# Patient Record
Sex: Male | Born: 1935 | Race: White | Hispanic: No | Marital: Married | Smoking: Former smoker
Health system: Southern US, Community
[De-identification: ages and names within clinical notes are randomized; demographics above are authoritative.]

## PROBLEM LIST (undated history)

## (undated) DIAGNOSIS — I10 Essential (primary) hypertension: Secondary | ICD-10-CM

## (undated) DIAGNOSIS — I739 Peripheral vascular disease, unspecified: Secondary | ICD-10-CM

## (undated) DIAGNOSIS — K219 Gastro-esophageal reflux disease without esophagitis: Secondary | ICD-10-CM

## (undated) DIAGNOSIS — E7800 Pure hypercholesterolemia, unspecified: Secondary | ICD-10-CM

## (undated) DIAGNOSIS — I2510 Atherosclerotic heart disease of native coronary artery without angina pectoris: Secondary | ICD-10-CM

## (undated) DIAGNOSIS — M1990 Unspecified osteoarthritis, unspecified site: Secondary | ICD-10-CM

## (undated) DIAGNOSIS — J449 Chronic obstructive pulmonary disease, unspecified: Secondary | ICD-10-CM

## (undated) DIAGNOSIS — J40 Bronchitis, not specified as acute or chronic: Secondary | ICD-10-CM

## (undated) DIAGNOSIS — J189 Pneumonia, unspecified organism: Secondary | ICD-10-CM

## (undated) DIAGNOSIS — J90 Pleural effusion, not elsewhere classified: Secondary | ICD-10-CM

## (undated) DIAGNOSIS — J9600 Acute respiratory failure, unspecified whether with hypoxia or hypercapnia: Secondary | ICD-10-CM

## (undated) DIAGNOSIS — R739 Hyperglycemia, unspecified: Secondary | ICD-10-CM

## (undated) DIAGNOSIS — F419 Anxiety disorder, unspecified: Secondary | ICD-10-CM

## (undated) DIAGNOSIS — K635 Polyp of colon: Secondary | ICD-10-CM

## (undated) DIAGNOSIS — G4730 Sleep apnea, unspecified: Secondary | ICD-10-CM

## (undated) DIAGNOSIS — E039 Hypothyroidism, unspecified: Secondary | ICD-10-CM

## (undated) DIAGNOSIS — E119 Type 2 diabetes mellitus without complications: Secondary | ICD-10-CM

## (undated) HISTORY — PX: COLONOSCOPY: SHX174

## (undated) HISTORY — PX: CATARACT EXTRACTION W/ INTRAOCULAR LENS  IMPLANT, BILATERAL: SHX1307

## (undated) HISTORY — DX: Hyperglycemia, unspecified: R73.9

## (undated) HISTORY — PX: HEMORRHOID SURGERY: SHX153

---

## 2000-01-31 HISTORY — PX: CORONARY ANGIOPLASTY: SHX604

## 2001-09-02 ENCOUNTER — Ambulatory Visit (HOSPITAL_COMMUNITY): Admission: RE | Admit: 2001-09-02 | Discharge: 2001-09-02 | Payer: Self-pay | Admitting: *Deleted

## 2006-06-20 ENCOUNTER — Inpatient Hospital Stay (HOSPITAL_COMMUNITY): Admission: EM | Admit: 2006-06-20 | Discharge: 2006-06-21 | Payer: Self-pay | Admitting: Emergency Medicine

## 2006-07-07 ENCOUNTER — Ambulatory Visit: Payer: Self-pay | Admitting: Pulmonary Disease

## 2006-07-07 ENCOUNTER — Encounter: Admission: RE | Admit: 2006-07-07 | Discharge: 2006-07-07 | Payer: Self-pay | Admitting: Interventional Cardiology

## 2006-07-07 ENCOUNTER — Inpatient Hospital Stay (HOSPITAL_COMMUNITY): Admission: AD | Admit: 2006-07-07 | Discharge: 2006-07-09 | Payer: Self-pay | Admitting: Interventional Cardiology

## 2006-07-08 ENCOUNTER — Encounter (INDEPENDENT_AMBULATORY_CARE_PROVIDER_SITE_OTHER): Payer: Self-pay | Admitting: Interventional Cardiology

## 2006-07-08 ENCOUNTER — Encounter (INDEPENDENT_AMBULATORY_CARE_PROVIDER_SITE_OTHER): Payer: Self-pay | Admitting: *Deleted

## 2006-07-15 ENCOUNTER — Ambulatory Visit: Payer: Self-pay | Admitting: Pulmonary Disease

## 2006-07-22 ENCOUNTER — Ambulatory Visit: Payer: Self-pay | Admitting: Pulmonary Disease

## 2006-08-06 ENCOUNTER — Ambulatory Visit: Payer: Self-pay | Admitting: Pulmonary Disease

## 2006-09-07 ENCOUNTER — Ambulatory Visit: Payer: Self-pay | Admitting: Pulmonary Disease

## 2006-09-08 ENCOUNTER — Ambulatory Visit (HOSPITAL_COMMUNITY): Admission: RE | Admit: 2006-09-08 | Discharge: 2006-09-08 | Payer: Self-pay | Admitting: Pulmonary Disease

## 2006-09-11 ENCOUNTER — Ambulatory Visit: Admission: RE | Admit: 2006-09-11 | Discharge: 2006-09-11 | Payer: Self-pay | Admitting: Pulmonary Disease

## 2006-09-23 ENCOUNTER — Ambulatory Visit: Payer: Self-pay | Admitting: Pulmonary Disease

## 2006-10-15 ENCOUNTER — Ambulatory Visit: Payer: Self-pay | Admitting: Pulmonary Disease

## 2006-11-26 ENCOUNTER — Ambulatory Visit: Payer: Self-pay | Admitting: Pulmonary Disease

## 2007-04-25 ENCOUNTER — Ambulatory Visit: Payer: Self-pay | Admitting: Pulmonary Disease

## 2007-09-20 DIAGNOSIS — G4730 Sleep apnea, unspecified: Secondary | ICD-10-CM

## 2007-09-21 ENCOUNTER — Ambulatory Visit: Payer: Self-pay | Admitting: Pulmonary Disease

## 2007-09-21 DIAGNOSIS — R269 Unspecified abnormalities of gait and mobility: Secondary | ICD-10-CM | POA: Insufficient documentation

## 2007-09-21 DIAGNOSIS — G4700 Insomnia, unspecified: Secondary | ICD-10-CM

## 2007-09-21 DIAGNOSIS — F5104 Psychophysiologic insomnia: Secondary | ICD-10-CM | POA: Insufficient documentation

## 2007-09-21 DIAGNOSIS — J449 Chronic obstructive pulmonary disease, unspecified: Secondary | ICD-10-CM

## 2007-09-30 ENCOUNTER — Telehealth (INDEPENDENT_AMBULATORY_CARE_PROVIDER_SITE_OTHER): Payer: Self-pay | Admitting: *Deleted

## 2007-11-24 ENCOUNTER — Telehealth: Payer: Self-pay | Admitting: Pulmonary Disease

## 2008-01-16 ENCOUNTER — Ambulatory Visit: Payer: Self-pay | Admitting: Pulmonary Disease

## 2008-03-16 ENCOUNTER — Ambulatory Visit: Payer: Self-pay | Admitting: Pulmonary Disease

## 2008-03-19 ENCOUNTER — Telehealth (INDEPENDENT_AMBULATORY_CARE_PROVIDER_SITE_OTHER): Payer: Self-pay | Admitting: *Deleted

## 2008-03-26 ENCOUNTER — Telehealth (INDEPENDENT_AMBULATORY_CARE_PROVIDER_SITE_OTHER): Payer: Self-pay | Admitting: *Deleted

## 2008-06-08 ENCOUNTER — Emergency Department (HOSPITAL_COMMUNITY): Admission: EM | Admit: 2008-06-08 | Discharge: 2008-06-08 | Payer: Self-pay | Admitting: Emergency Medicine

## 2010-10-14 NOTE — Assessment & Plan Note (Signed)
[facility]                             PULMONARY OFFICE NOTE   [name], [name]                         MRN:          [id number]  DATE:11/26/2006                            DOB:          Aug 24, 1926    I saw [name] in followup today for his obstructive sleep apnea.   He is doing quite well.  He is currently on an auto CPAP machine with a  full face mask and heated humidification.  He is using the machine for  the entire time that he is asleep.  He goes to bed at 10 o'clock at  night.  He will wake up once in the night to use the bathroom and then  goes back to sleep.  He gets up at 7:30 in the morning.  He said he had  one episode where he unwittingly took his mask off in the middle of the  night, but otherwise he says that he has noticed an increased amount of  energy during the day and he is planning [facility]  He is not having any problems as far as  dryness or congestion in his nose.  He does have occasional dryness in  his mouth, but this is not too much of a problem.   His medication list was reviewed.   PHYSICAL EXAMINATION:  He is 257 pounds.  Temperature is 97.5.  Blood  pressure is 108/68, heart rate is 55, oxygen saturation is 95% on room  air.  HEENT:  There is no sinus tenderness, no nasal discharge, no oral  lesions, no lymphadenopathy.  HEART:  With S1, S2.  CHEST:  Clear to auscultation.  ABDOMEN:  Obese, soft, nontender.  EXTREMITIES:  With no edema.   IMPRESSION:  He has severe obstructive sleep apnea currently doing quite  well on an auto CPAP machine.  I would encourage him to maintain his  compliance with the CPAP machine.  I have also emphasized to him the  importance of diet, exercise and weight reduction.   I will follow up with him in approximately 6 months.     Bryan Helling, MD  Electronically Signed    VS/MedQ  DD: 11/26/2006  DT: 11/26/2006  Job #: [id number]   cc:   Bryan Hatfield, M.D.  Bryan Dubin PA Bryan Hatfield

## 2010-10-14 NOTE — Assessment & Plan Note (Signed)
[facility]                             PULMONARY OFFICE NOTE   STANLEE, ROEHRIG                         MRN:          [id number]  DATE:04/25/2007                            DOB:          1927/05/22    I saw Mr. Goldinger in followup today for his severe obstructive sleep apnea.  He is currently on an auto CPAP with a full face mask and heated  humidification.  His main difficulty today is that he is having trouble  falling asleep.  He says that he takes temazepam 30 mg at around 6:30 at  night.  He will not then go to sleep until about 11 o'clock at night.  He says it takes him up to an hour and a half to fall asleep.  He will  oftentimes have a lot of thoughts on his mind, and he has to check the  clock while he is trying to fall asleep.  He says that if he cannot fall  asleep after a while, he gets up and watches TV.  Once he does fall  asleep, he is able to sleep for about 3 hours.  He wakes up to use the  bathroom, but then is able to fall back to sleep after that.  He gets up  at about 7:30 in the morning and says that he feels reasonably  refreshed, and does not feel the need to take naps during the day.  He  is not having problems as far as tolerating his CPAP mask, and feels  like he is getting adequate pressure from this.  He has not had any  other significant changes in health or his medications since his last  visit.   IMPRESSION:  1. Severe obstructive sleep apnea.  I would continue him on his      current settings of auto CPAP, and I have encouraged him to      maintain his compliance with this.  2. Sleep onset insomnia.  I had reviewed various techniques with      regards to cognitive behavioral therapy, specifically with regards      to stimulus control and relaxation techniques.  He does also have a      history of depression, and it may be beneficial to have this      further addressed.  In addition, I have discussed with him the  possibility of tapering off of his Restoril since I am not sure if      this is actually providing him with any benefit, but I would defer      this decision to his managing team at the [facility].   I will follow up with him in approximately 6 months.     Coralyn Helling, MD  Electronically Signed    VS/MedQ  DD: 04/25/2007  DT: 04/25/2007  Job #: [id number]   cc:   Lyn Records, M.D.  Corwin Levins, MD

## 2010-10-14 NOTE — Assessment & Plan Note (Signed)
[facility]                             PULMONARY OFFICE NOTE   Bryan Hatfield, Bryan Hatfield                         MRN:          [id number]  DATE:10/15/2006                            DOB:          04/05/1927    This is a very pleasant 77 year old gentleman who follows here for  hypersomnolence.  He had been evaluated initially for a pleural  effusion, which had resolved.  This was parapneumonic.  During the  course of evaluation for his pleural effusion, it was noted that the  patient had issues with daytime somnolence and snoring.  We did evaluate  him for sleep apnea, and he indeed does have moderate obstructive sleep  apnea with an apnea/hypopnea index of 29 events per hour.  Oxygen  desaturations were as low as 29%.  The patient has now been placed on  auto-set CPAP with oxymetry to be repeated with a CPAP.  The patient,  however, since starting CPAP therapy, feels that he is getting a more  restful sleep, and actually his wife also states that he is more awake  during the day and also notes that he does not have snoring.   CURRENT MEDICATIONS:  As noted on the intake sheet.  These have been  reviewed and are accurate.   PHYSICAL EXAM:  VITAL SIGNS:  Noted.  Oxygen saturation was 96% on room  air.  GENERAL:  This is a well-developed obese gentleman who is in no acute  distress.  HEENT:  Examination is unremarkable.  NECK:  Supple.  No adenopathy noted.  No JVD.  LUNGS:  Clear to auscultation bilaterally.  CARDIAC:  Regular rate and rhythm.  No rubs, murmurs, or gallops heard.  EXTREMITIES:  The patient has no cyanosis, clubbing, or edema noted.   IMPRESSION:  Obstructive sleep apnea.  The patient is currently on auto-  set device.  He is to continue using continuous positive airway  pressure.   PLAN:  Will be for the patient to follow up either with Dr. Coralyn Helling  or Dr. Marcelyn Bruins.  The patient has been made aware that I will be  leaving the  practice.     Gailen Shelter, MD  Electronically Signed    CLG/MedQ  DD: 11/06/2006  DT: 11/06/2006  Job #: [id number]   cc:   Sinda Du, PA

## 2010-10-17 NOTE — Assessment & Plan Note (Signed)
[facility]                             PULMONARY OFFICE NOTE   Bryan Hatfield                         MRN:          [id number]  DATE:07/15/2006                            DOB:          [birth year]    [name] is a 77 year old white male with a past medical history  significant for anxiety, insomnia, diabetes mellitus with diet control,  GERD, hyperlipidemia, who was admitted on January 20th through January  21st due to shortness of breath.  At that point, we thought that this  was coming from CHF, and he underwent cardiac catheterization that did  not show major problems.  He was seen by Dr. Verdis Prime at that point.  Also, during that hospitalization, left pleural effusion was seen with a  really small right pleural effusion.  He was sent home, but because the  patient continued to have shortness of breath, he was readmitted in  February.  I do not have the discharge summary of that admission, but he  was seen by Dr. Jayme Cloud, and he had a thoracentesis.  He comes today  for a hospital follow-up, saying he is still with shortness of breath.  He is pretty much sleeping in his recliner.  He gets short of breath at  night.  He has been very weak, and he gets short of breath with minimal  effort.  He denies any fever, chills, sputum, or cough.  He does say  that he has some chest pain with deep inspiration.  Looking back on his  perfusion labs, he was AFB negative.  Culture was negative.  He had  reactive microthelia cells plus some inflammation, and pathology did not  show any malignant cells.  His ESR was 95, LDH 52 of the fluid, and  protein of the fluid was 3.5 with total protein 016.2.  Today, we did a  chest x-ray that showed persistent left pleural effusion.   MEDICATIONS:  1. Lasix 30 mg daily.  2. Zocor 80 mg daily.  3. Lisinopril 10 mg daily.  4. Zoloft 100 mg daily.  5. Aspirin 81 mg daily.  6. Clonazepam 1 mg b.i.d.  7.  Oxybutynin 5 mg b.i.d.  8. Omeprazole 20 mg p.o. daily.  9. Temazepam 30 mg p.o. nightly.  10.MVI daily.  11.Nitroglycerin 0.4 mg sublingual p.r.n.  12.Advil p.r.n.   PAST MEDICAL HISTORY:  Reviewed.   PHYSICAL EXAMINATION:  VITAL SIGNS:  Temperature 97.6, weight 247, blood  pressure lying down is 100/60 with pulse 92.  Sitting 102/60 with pulse  88.  Standing 98/58 with pulse 88.  Oxygen saturation 92% on room air.  GENERAL:  He is a very pleasant man in no acute distress.  Very  cooperative.  HEENT:  Pharynx is clear with no exudate.  HEART:  Regular rate and rhythm.  No murmurs.  LUNGS:  He has good air movement on the left side.  He has decreased  breath sounds on the lower third and also dullness to percussion.  He  has bronchial sounds just above this area.  EXTREMITIES:  Lower extremities, trace edema.   IMPRESSION:  Left pleural effusion:  At this point, the results of the  pleural effusion were in between exudate and transudate.  So, the  working diagnosis is probably secondary to pneumonia infection.   PLAN:  We will start Factive 1 p.o. daily for 7 days, Celebrex 200 mg  p.o. daily for 7 days.  Follow up in one week when he will get a chest x-  ray if pleural effusion does not show any improvement.  We will refer  him to [facility] for VATS.  Finally, because his blood pressure is on the  lower side, even though he is not orthostatic, we advised the patient to  hold on his Lasix for 2-3 days, as he has been getting dizzy when  standing up at home.      Bryan Bast, MD      C. Danice Goltz, MD  Electronically Signed   Rivka Safer  DD: 07/15/2006  DT: 07/15/2006  Job #: [id number]   cc:   Lyn Records, M.D.

## 2010-10-17 NOTE — Assessment & Plan Note (Signed)
Bryan Hatfield                             PULMONARY OFFICE NOTE   Bryan Hatfield, Bryan Hatfield                         MRN:          [id number]  DATE:07/22/2006                            DOB:          [birth year]    HISTORY OF PRESENT ILLNESS:  Patient is a very pleasant 77 year old  white male patient of Dr. Jayme Cloud who presents for a one week followup  of a pleural effusion.  The patient was recently hospitalized January 20  through January 21 for dyspnea.  The patient was found to have a large  left pleural effusion and a very small right pleural effusion.  The  patient underwent a thoracentesis which cytology showed a mixed  exudative/transudative pleural effusion negative for malignant cells.  The patient was seen one week ago in the office showing a persistent  left pleural effusion felt secondary to possibly a pneumonia.  He was  started on a seven day course of Factive and given Celebrex for seven  days.  Patient returns today for follow-up chest x-ray.   His last visit, he feels much improved with decreased shortness of  breath and cough.  The patient denies any hemoptysis, purulent sputum,  fever, chest pain, or leg swelling.   PAST MEDICAL HISTORY:  Reviewed.   CURRENT MEDICATIONS:  Reviewed.   PHYSICAL EXAMINATION:  GENERAL:  Patient is an elderly male in no acute  distress.  VITAL SIGNS:  He is afebrile with stable vital signs.  O2 saturation is  94% on room air.  HEENT:  Unremarkable.  NECK:  Supple without adenopathy.  LUNGS:  Lung sounds revealed slightly diminished breath sounds in the  bases.  CARDIAC:  Regular rate.  ABDOMEN:  Soft and benign.  EXTREMITIES:  Warm with trace edema.   DATA:  A chest x-ray shows a left pleural effusion.   IMPRESSION/PLAN:  Persistent left lower lobe pleural effusion, slightly  improved on x-ray.  The patient does appear clinically to be improved.  We will hold off on any further antibiotics.  He will  continue on Celebrex 200 mg daily and return back with Dr.  Jayme Cloud in two weeks for a follow-up chest x-ray.      Rubye Oaks, NP  Electronically Signed      Gailen Shelter, MD  Electronically Signed   TP/MedQ  DD: 07/23/2006  DT: 07/23/2006  Job #: [id number]

## 2010-10-17 NOTE — Discharge Summary (Signed)
NAME:  Bryan, Hatfield NO.:  [id number]   MEDICAL RECORD NO.:  [id number]          PATIENT TYPE:  INP   LOCATION:  3711                         FACILITY:  [facility]   PHYSICIAN:  Lyn Records, M.D.   DATE OF BIRTH:  1926-11-18   DATE OF ADMISSION:  07/07/2006  DATE OF DISCHARGE:  07/09/2006                               DISCHARGE SUMMARY   ADMISSION DIAGNOSIS:  Worsening dyspnea.   DISCHARGE DIAGNOSES:  1. Worsening dyspnea secondary to bilateral pleural effusion with the      left being greater than the right status post thoracentesis,      resolved.  2. Elevated D-dimer status post chest CT angiography that is negative      for pulmonary embolism.  3. Status post cardiac catheterization, January 2008, with      nonobstructive coronary disease and a normal left ventricular      function.  4. Dyslipidemia.  5. Anxiety.  6. Insomnia.  7. Diabetes mellitus, diet-controlled.  8. Gastroesophageal reflux disease.  9. Status post hemorrhoidectomy.  10.Left leg fracture in the 1980s.  11.Anemia, stable.  12.Leukocytosis secondary to questionable bronchogenic process.   CONSULTATIONS:  Pulmonology.   PROCEDURES:  Left thoracentesis on July 08, 2006.   HOSPITAL COURSE:  Bryan Hatfield is a 77 year old male who recently underwent  a cardiac catheterization in January 2008 with nonobstructive coronary  and a normal EF.  He was admitted to the [facility] on July 07, 2006 with progressive dyspnea.  He admitted to a productive cough  with yellow sputum.  He also complained of dizziness and fatigue.  Initially chest x-ray as an outpatient on the day of admission revealed  left lower lobe lung consolidation or effusion.  BNP was mildly elevated  at 214, however, was nondiagnostic.  D-dimer was elevated at 3.51;  however, chest CT angiography was negative for pulmonary embolism.  Serial cardiac enzymes were negative x3 with a peak troponin of 0.05.  TSH was  normal.  A 12-lead EKG revealed normal sinus rhythm with a  ventricular rate of 77 beats per minute with nonspecific T-wave  abnormalities.  There was no evidence of acute ischemic changes.  A 2-D  echocardiogram revealed normal LV systolic function with an EF of 55%.  There was no evidence of LV regional wall motion abnormalities.  The  right ventricle was dilated, and RV systolic function was mildly  reduced.  Left lateral decubitus view of the chest revealed left pleural  effusion that appeared partially free-flowing.  The patient underwent a  left thoracentesis with improvement of dyspnea.  Pleural effusion  appeared to be mainly exudative.  During this admission, the patient had  elevated liver function and a decreased albumin, so a hepatitis panel  was checked.  Repeat chest x-ray on the following day revealed decreased  size of left pleural effusion after thoracentesis.  No pneumothorax.  Improved left base atelectasis and cardiomegaly without failure.  During  the admission, the patient experienced episodic hypotension, so his  lisinopril was placed on hold for a heart rate less  than 60 or systolic  blood pressure less than 90.  A urine stress antigen was checked.  However, UA was negative.  The patient was discharged to home in stable  condition on July 09, 2006, with the diagnosis from the thoracentesis  as pseudoexudate.  He had been on Lasix prior to this hospitalization.  Cytology of the thoracentesis was pending.  The patient was scheduled  for an outpatient sleep study as well as a followup chest x-ray at  [facility] on February 14.  He was seen and examined by Dr.  Verdis Prime prior to discharge, who was in agreement with the discharge  decision.   LABORATORY DATA:  UA negative.  Urine strep pneumo antigen negative, TSH  1.953.  BNP 214 and 192, respectively.  Fasting lipid panel:  Total  cholesterol 116, triglycerides 85, HDL 32, LDL 67.  Serial cardiac   enzymes:  CK total 51, 54, and 41, respectively.  CK-MB 1.2, 0.9, and  0.7, respectively.  Troponin I 0.05 x2 and 0.04.  PT 14.6, INR 1.1, PTT  40, D-dimer 3.51.  Sodium 140, potassium 3.8, chloride 105, CO2 30,  glucose 104, BUN 11, creatinine 0.85.  ESR 95.  White blood count 5.8,  hemoglobin 10.9, hematocrit 32.2, platelets 290,000.   X-RAYS:  As stated in the hospital course.   EKG:  A 12-lead EKG July 09, 2006 revealed normal sinus rhythm with a  ventricular rate of 69 beats per minute.  There was no evidence of acute  ischemic changes.   CONDITION ON DISCHARGE:  Stable.   DISCHARGE MEDICATIONS:  1. Lasix 30 mg daily.  2. Zocor 80 mg daily.  3. Lisinopril 10 mg daily.  4. Zoloft 100 mg daily.  5. Aspirin 81 mg daily.  6. Sublingual nitroglycerin 0.4 mg p.r.n. chest pain.  7. Clonazepam 1 mg twice daily.  8. Oxybutynin 5 mg twice daily.  9. Omeprazole 20 mg daily.  10.Temazepam 30 mg q.h.s.  11.Multivitamin 1 tablet daily.  12.Advil p.r.n.   DISCHARGE INSTRUCTIONS:  1. No restrictions on activity.  2. Continue low-sodium, low-fat, and low-cholesterol diet.  3. Stop any activity that causes chest pain, shortness of breath,      dizziness, diaphoresis, or excessive weakness.   FOLLOWUP APPOINTMENT:  1. Followup appointment with Dr. Verdis Prime at [facility] as      previously scheduled or as needed.  The      patient is scheduled to call [phone] for an appointment.  2. Followup appointment at [facility] with Dr. Danice Goltz      on July 15, 2006 at 2:30 p.m. with a chest x-ray.      Tylene Fantasia, [place]      Lyn Records, M.D.  Electronically Signed    RDM/MEDQ  D:  08/23/2006  T:  08/23/2006  Job:  [id number]   cc:   Marjory Lies, M.D.  Gailen Shelter, MD

## 2010-10-17 NOTE — Procedures (Signed)
NAME:  Bryan Hatfield, Bryan Hatfield NO.:  [id number]   MEDICAL RECORD NO.:  [id number]          PATIENT TYPE:  OUT   LOCATION:  SLEEP LAB                     FACILITY:  [facility]   PHYSICIAN:  Barbaraann Share, MD,FCCPDATE OF BIRTH:  1928/[birth year]/10   DATE OF STUDY:  09/11/2006                            NOCTURNAL POLYSOMNOGRAM   REFERRING PHYSICIAN:   REFERRING PHYSICIAN:  Dr. Danice Goltz   INDICATION FOR STUDY:  Hypersomnia with sleep apnea.   EPWORTH SCORE:  Two.   SLEEP ARCHITECTURE:  The patient had total sleep time of 319 minutes  with adequate slow wave sleep for age, however very decreased REM at  only 13 minutes.  Sleep onset latency was normal and REM onset was very  prolonged at 293 minutes.  Sleep efficiency was decreased at 81%.   RESPIRATORY DATA:  The patient was found to have 114 obstructive  hypopneas and 42 obstructive apneas for an apnea/hypopnea index of 29  events per hour.  The events occurred primarily in the supine position  and there was moderate to loud snoring noted throughout.   OXYGEN DATA:  The patient had O2 desaturation as low as 79% with his  obstructive events during REM.   CARDIAC DATA:  No clinically significant cardiac arrhythmias were noted.   MOVEMENT/PARASOMNIA:  The patient was found to have 77 leg jerks with  three per hour resulting in arousal or awakening.   IMPRESSION/RECOMMENDATIONS:  1. Moderate obstructive sleep apnea/hypopnea syndrome with an      apnea/hypopnea index of 29 events per hour and O2 desaturation as      low as 79%.  Treatment for this degree of sleep apnea can include      weight loss alone if applicable, upper airway surgery, oral      appliance, and also CPAP.  2. Moderate numbers of leg jerks with significant sleep disruption.      This may simply be a manifestation of the patient's sleep      disordered breathing, however a primary movement disorder of sleep      should be kept in mind if      the patient  is very symptomatic now or if he fails to respond      adequately to treatment of his obstructive sleep apnea.      Barbaraann Share, MD,FCCP  Diplomate, American Board of Sleep  Medicine  Electronically Signed     [facility]/MEDQ  D:  09/22/2006 14:00:06  T:  09/22/2006 14:29:54  Job:  [id number]

## 2010-10-17 NOTE — Assessment & Plan Note (Signed)
[facility]                             PULMONARY OFFICE NOTE   NAME:Bryan Hatfield, Bryan Hatfield                         MRN:          [id number]  DATE:08/06/2006                            DOB:          10-07-1926    HISTORY OF PRESENT ILLNESS:  The patient is a 77 year old white male  patient of Dr. Berlinda Last who has a known history of diabetes  mellitus, gastroesophageal reflux, hyperlipidemia, who is being followed  for a persistent pleural effusion.  The patient returns today for a 2-  week followup.  The patient has had a persistent large left pleural  effusion and a very small right pleural effusion.  The patient underwent  a thoracentesis with cytology which showed a mixed  exudative/transudative pleural effusion which was negative for malignant  cells.  He was treated with antibiotics and nonsteroidals, Celebrex.  The patient returns today reporting that he is much improved with  decreased shortness of breath and chest wall pain has totally resolved.  The patient denies any purulent sputum, fever, chest pain, orthopnea,  PND or leg swelling.   PAST MEDICAL HISTORY:  Reviewed.   CURRENT MEDICATIONS:  Reviewed.   PHYSICAL EXAMINATION:  GENERAL:  The patient is a pleasant male in no  acute distress.  VITAL SIGNS:  He is afebrile with stable vital signs.  O2 saturation is  96% on room air.  HEENT:  Unremarkable.  NECK:  Supple without adenopathy.  No JVD.  LUNG SOUNDS:  Reveal slightly diminished breath sounds on the left.  CARDIAC:  Regular rate and rhythm.  ABDOMEN:  Soft and nontender.  EXTREMITIES:  Warm without any calf tenderness, cyanosis, clubbing, or  edema.   DATA:  Chest x-ray shows an essentially decreased pleural effusion on  the left.   IMPRESSION AND PLAN:  Left lower pleural effusion, much improved today.  The patient will continue on his present regimen.  May discontinue  Celebrex.  Will follow back up in 1 month with Dr.  Jayme Cloud for a  followup chest x-ray.      Rubye Oaks, NP  Electronically Signed      Gailen Shelter, MD  Electronically Signed   TP/MedQ  DD: 08/06/2006  DT: 08/07/2006  Job #: [id number]

## 2010-10-17 NOTE — Cardiovascular Report (Signed)
NAME:  Bryan Hatfield, Bryan Hatfield NO.:  [id number]   MEDICAL RECORD NO.:  [id number]          PATIENT TYPE:  INP   LOCATION:  3740                         FACILITY:  [facility]   PHYSICIAN:  Corky Crafts, MDDATE OF BIRTH:  Mar 05, 1927   DATE OF PROCEDURE:  06/21/2006  DATE OF DISCHARGE:                            CARDIAC CATHETERIZATION   REFERRING PHYSICIAN:  Lyn Records, M.D.   PROCEDURES PERFORMED:  Left heart catheterization, left ventriculogram,  coronary angiogram, abdominal aortogram.   OPERATOR:  Corky Crafts, M.D.   INDICATIONS:  Chest pain, dyspnea on exertion.   PROCEDURE NARRATIVE:  The risks and benefits of cardiac catheterization  were explained to the patient and informed consent was obtained. The  patient was brought to the cath lab and placed on the table.  He was  prepped and draped the usual sterile fashion.  Right groin was  infiltrated 1% lidocaine.  A 6-French arterial sheath was placed into  the right femoral artery using modified Seldinger technique.  The left  coronary artery angiography was performed using a JL-4.0 catheter.  The  catheter was advanced to the vessel ostium under fluoroscopic guidance.  Digital angiography was performed in multiple projections using hand  injection of contrast. The right coronary artery angiography was then  performed using a JR-4.0 catheter.  The catheter was advanced to the  vessel ostium under fluoroscopic guidance.  Digital angiography was  performed in multiple projections using hand injection of contrast.  Pigtail catheter was then advanced to the ascending aorta and across the  aortic valve.  A power injection of contrast done in the 30 degree RAO  position.  The image left ventricle under continuous hemodynamic  pressure monitoring.  A pullback was performed.  The catheter was then  withdrawn to the level renal arteries.  Power injection of contrast was  done in the AP projection.  The sheath  was removed and a Star close was  deployed for hemostasis.   FINDINGS:  The left main was calcified.  There is a 25% ostial left main  lesion which did not cause catheter damping. The left circumflex was a  medium-sized vessel with luminal irregularities.  There is a medium-  sized OM-1 also with luminal irregularities.  The left anterior  descending was a large vessel in proximal and midportion with luminal  irregularities.  The vessel tapered before reaching the apex. The D1 was  a large vessel.  There is an ostial 50% lesion. D2 was small vessel.  The right coronary artery was a large dominant vessel with luminal  irregularities.  This vessel supplied blood flow to the apex.  The left  ventriculogram revealed normal ventricular function with an estimated EF  of 60%.   HEMODYNAMIC RESULTS:  Left ventricular pressure of 91/6 with an LVEDP of  25, aortic pressure 91/53 with a mean aortic pressure of 69 mmHg. The  abdominal aortogram revealed no evidence of renal artery stenosis.  There are mild irregularities of the abdominal aorta.   IMPRESSION:  1. No significant coronary artery disease.  2. Mildly increased  left ventricular end-diastolic pressure.  3. Normal left ventricular function.  4. No renal artery stenosis.   RECOMMENDATIONS:  Continue medical therapy. We will increase the  patient's Lasix from 10-20 mg daily.  His symptoms of dyspnea on  exertion may be somewhat related to his elevated LVEDP.  The patient  will follow-up with Dr. Katrinka Blazing.      Corky Crafts, MD  Electronically Signed     JSV/MEDQ  D:  06/21/2006  T:  06/21/2006  Job:  [id number]

## 2010-10-17 NOTE — Discharge Summary (Signed)
NAME:  Bryan Hatfield, ABDALLAH NO.:  [id number]   MEDICAL RECORD NO.:  [id number]          PATIENT TYPE:  INP   LOCATION:  3740                         FACILITY:  [facility]   PHYSICIAN:  Lyn Records, M.D.   DATE OF BIRTH:  Oct 10, 1926   DATE OF ADMISSION:  06/20/2006  DATE OF DISCHARGE:  06/21/2006                               DISCHARGE SUMMARY   ADMISSION DIAGNOSIS:  Chest pain.   DISCHARGE DIAGNOSES:  1. Chest pain resolved.  Status post cardiac catheterization with no      significant coronary artery disease June 21, 2006.  2. Normal LV function via cardiac catheterization with an EF of 60%.  3. Gastroesophageal reflux disease.  4. Hyperlipidemia.  5. Insomnia.  6. Anxiety.  7. Diabetes mellitus with diet control.  8. Status post hemorrhoidectomy.  9. Status post left leg fracture in the 1980s.  10.History of pruritus.   CONSULTATIONS:  None.   PROCEDURES:  June 21, 2006:  Left heart catheterization, left  ventriculogram, coronary angiogram, abdominal aortogram.   HOSPITAL COURSE:  Mr. Hobbins is a 77 year old male who underwent a PTCA by  Dr. Katrinka Blazing in 2001.  Since that time he has complained of no subsequent  angina until this hospital admission which occurred on June 20, 2006.  A 12-lead EKG revealed no acute ischemic changes.  Chest x-ray revealed  cardiomegaly with possible vascular congestion.  Serial cardiac enzymes  were negative x3 with a peak troponin of 0.01.  The patient was started  on IV nitroglycerin and subcu Lovenox.  He was also started on a beta  blocker.  The following day he underwent a cardiac catheterization that  was performed by Dr. Eldridge Dace revealing no significant coronary artery  disease.  Normal LV function with an EF of 60% was noted.  The plan was  to continue medical therapy and to increase the patient's Lasix from 10  to 20 mg daily.  His symptoms of dyspnea on exertion were believed to be  somewhat related to his elevated  LVEDP.  The patient tolerated the  procedure well with a minimal estimated blood loss.  The patient was  observed and successfully completed IV hydration as well as bed rest.  His groin was stable without evidence of bleeding, oozing or hematoma.  He was discharged to home on the same day after completing bed rest  without any further complaints of angina, shortness of breath,  diaphoresis, dizziness, nausea, vomiting.   LABORATORY DATA:  Serial cardiac enzymes:  CK total 72, 71, and 70, CK-  MB 0.8, 1.2, and 1.  Troponin I 0.01 x3, myoglobin 112, CK-MB less than  1, troponin I less than 0.05.  BNP 66.  Fasting lipid panel:  Total  cholesterol 154, triglycerides 82, HDL 50, LDL 88.  Sodium 136,  potassium 4.5, chloride 104, CO2 25, glucose 159, BUN 21, creatinine  0.91.  D-dimer less than 0.22.  PT 15.3, INR 1.2, PTT 52, PSA 0.85.  TSH  6.849, free T4 6.9.   X-rays as stated in the Hospital Course.   12-lead EKGs  as stated in the Hospital Course.   CONDITION ON DISCHARGE:  Stable.   DISCHARGE MEDICATIONS:  1. Furosemide 20 mg daily.  This represented an increase in dosage.  A      prescription was given with refills.  2. Sertraline 100 mg daily.  3. Lisinopril 10 mg daily.  4. Clonazepam 1 mg 1/2 tablet twice daily as needed.  5. Simvastatin 80 mg daily.  6. Hydroxyzine 20 mg daily as needed.  7. Oxybutynin chloride 5 mg twice daily.  8. Omeprazole 20 mg daily.  9. Temazepam 30 mg at bedtime.  10.Aspirin 81 mg daily.  11.Multivitamin one tablet daily.   DISCHARGE INSTRUCTIONS:  1. No lifting greater than 10 pounds for one week.  2. No driving for two days.  3. Continue a low sodium, low fat, and low cholesterol diet.  4. Cleanse groin area with warm soap and water.  Do not scrub area.      Avoid straining.  5. Stop any activity that causes chest pain, shortness of breath,      dizziness, sweating or excessive weakness.   FOLLOW-UP APPOINTMENTS:  1. A follow-up  appointment has been scheduled with Kizzie Furnish, PA-C      for groin check on June 28, 2006 at 1:30 p.m.  The patient is      scheduled to call [phone] if he is unable to make the scheduled      appointment.  2. Follow up with Dr. Verdis Prime as previously scheduled or as      needed.      Tylene Fantasia, [place]      Lyn Records, M.D.  Electronically Signed    RDM/MEDQ  D:  11/01/2006  T:  11/01/2006  Job:  [id number]

## 2010-10-17 NOTE — H&P (Signed)
NAME:  Hatfield, Bryan NO.:  [id number]   MEDICAL RECORD NO.:  [id number]          PATIENT TYPE:  INP   LOCATION:  3740                         FACILITY:  [facility]   PHYSICIAN:  Cassell Clement, M.D. DATE OF BIRTH:  [birth year]   DATE OF ADMISSION:  06/20/2006  DATE OF DISCHARGE:                              HISTORY & PHYSICAL   CHIEF COMPLAINT/HISTORY:  This is a 77 year old married Caucasian  gentleman admitted with chest pain.  He has a past history of having had  chest pain in 2001 and at that time Dr. Verdis Prime performed cardiac  catheterization and angioplasty.  We do not have any details of that  procedure.  The patient reports that since then he has had no subsequent  angina.  He has not seen Dr. Katrinka Blazing in several years and has been  followed mainly at the [place].  He has had chronic dyspnea since 2001 which  really has not worsened.  He does have significant exogenous obesity and  is sedentary.  This morning he had the onset of substernal chest  discomfort at about 6:30 a.m. while still in bed.  He had forgotten to  take his omeprazole last night.  There was no radiation of his pain to  the arms or jaw or throat.  The pain was worse lying flat and worse with  a deep breath.  There was no nausea, vomiting or diaphoresis.  The pain  was similar to what he experienced in 2001 except that at that time he  did remember that he had a lot of nausea, vomiting and diaphoresis which  was not present on this admission.   The patient is on multiple medications from the [place] including Zoloft 100  mg daily, furosemide 10 mg daily, lisinopril 10 mg daily, clonazepam 0.5  mg b.i.d. p.r.n. anxiety, simvastatin 80 mg daily, hydroxyzine 10 mg  taking two at bedtime p.r.n. for itching, oxybutynin 5 mg b.i.d.,  omeprazole 20 mg daily, temazepam 30 mg h.s. p.r.n., aspirin 81 mg  daily, multivitamin one daily for eyes.   FAMILY HISTORY:  Positive for stroke.  His father died of a  stroke at  18.  Mother died of stroke at 21.   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  He retired in 1992.  The patient is married, has two  children.  He quit smoking about 18 years ago.  He does not drink any  alcohol.   ALLERGIES:  HE HAS NO KNOWN DRUG ALLERGIES.   PREVIOUS SURGERY INCLUDES:  Hemorrhoid surgery and two colonoscopies.   REVIEW OF SYSTEMS:  Review of systems reveals that he has not had any  melena.  He notes occasional bright red rectal blood which he attributes  to hemorrhoids.  He does have a hiatal hernia.  He has a good appetite  and his weight is 255 and is stable.  Genitourinary reveals nocturia two  to the three times a night.  He gets the PSA check yearly at the [place].  Respiratory reveals no cough  or sputum production.  He denies chills or  fever or night sweats.  Endocrine reveals no history of diabetes or  thyroid problems.   PHYSICAL EXAMINATION:  VITAL SIGNS:  Blood pressure is 107/63, pulse 68  regular, respirations normal, oxygen saturation 95% on 2 liters a  minute.  HEENT:  Negative.  Jugular venous pressure is normal.  Thyroid normal.  CHEST:  Lungs are clear to percussion and auscultation.  The heart  reveals distant heart tones without murmur, gallop, rub, or click.  ABDOMEN:  The abdomen is obese and nontender.  No mass.  EXTREMITIES:  Good peripheral pulses.  No edema, no phlebitis.   STUDIES:  His chest x-ray portable film shows cardiomegaly with question  early vascular congestion.  His electrocardiogram shows normal sinus  rhythm, left axis deviation, no acute changes.  He does have borderline  first-degree AV block with a PR of 0.21.  His initial cardiac enzymes  are negative including CK-MB less than 1.0 and troponin less than 0.05.  CBC, B-natriuretic peptide and CMET and other studies are pending.   IMPRESSION:  1. Chest pain, rule out myocardial infarction.  2. Past history of  ischemic heart disease with history of angioplasty      in 2001 by Dr. Verdis Prime.  3. Cardiomegaly.  4. History of hiatal hernia.   DISPOSITION:  We are admitting to telemetry to Dr. Verdis Prime.  We will  get serial cardiac enzymes.  We will start him empirically on IV  nitroglycerin and Lovenox at this point and also add low-dose beta  blocker.  We will keep him n.p.o. after midnight tonight for possible  cardiac catheterization in morning.           ______________________________  Cassell Clement, M.D.     TB/MEDQ  D:  06/20/2006  T:  06/20/2006  Job:  [id number]   cc:   Marjory Lies, M.D.  Lyn Records, M.D.

## 2010-10-17 NOTE — Assessment & Plan Note (Signed)
[facility]                             PULMONARY OFFICE NOTE   NAME:WESTWelcome, Fults                         MRN:          [id number]  DATE:09/07/2006                            DOB:          05/31/1927    This is a very pleasant, 77 year old gentleman who follows here after  noting to have bilateral pleural effusions. He was hospitalized between  February 6 to February 8. I initially saw him in consultation at that  time at the request of Dr. Garnette Scheuermann. He has been treated for presumed  pneumonia and a parapneumonic pleural effusion. This has resolved  completely. The patient states that he has had decreased levels of vim,  vigor and vitality and has had to rely on sleep aids, namely temazepam  over the last several years. Since his illness, all of these issues have  become aggravated. He, however, feels that he is getting stronger from  his initial illness in February. He continues to have, however, his  hypersomnolence during the day and easy fatigability which he describes  mainly as decreased energy level. The patient denies any other  symptomatology.   CURRENT MEDICATIONS:  As noted on the intake sheet. These have been  reviewed and are accurate.   PHYSICAL EXAMINATION:  VITAL SIGNS:  As noted. Oxygen saturation was 96%  on room air.  GENERAL:  This is a morbidly obese gentleman who is in no acute  distress.  HEENT:  Unremarkable.  NECK:  Supple, no adenopathy noted. No JVD.  LUNGS:  Clear to auscultation bilaterally.  CARDIAC:  Distant tones, regular rate and rhythm. No rubs, murmurs or  gallops heard.  ABDOMEN:  Obese otherwise benign.  EXTREMITIES:  The patient has chronic stasis changes but is otherwise  unremarkable.   We performed a chest x-ray today which showed that the patient had some  right base atelectasis; however, no effusion noted.   IMPRESSION:  1. Hypersomnolence with daytime fatigability and associated sleep  disordered breathing. Rule out obstructive sleep apnea.  2. Recent episode of pneumonia with parapneumonic effusion markedly      improved and with no evidence of pleural effusion at present.   PLAN:  1. Obtain sleep study at [facility]. This will be ready by      Dr. Marcelyn Bruins.  2. We will followup with patient in 4 weeks' time to review the sleep      study at that time. Then I      will assign him for followup either with Dr. Shelle Iron or Dr. Craige Cotta at      that time as I will be leaving the practice.     Gailen Shelter, MD  Electronically Signed    CLG/MedQ  DD: 09/22/2006  DT: 09/22/2006  Job #: [id number]   cc:   Lyn Records, M.D.

## 2010-10-17 NOTE — H&P (Signed)
NAME:  Bryan Hatfield, CARELOCK NO.:  [id number]   MEDICAL RECORD NO.:  [id number]          PATIENT TYPE:  INP   LOCATION:  3740                         FACILITY:  [facility]   PHYSICIAN:  Lyn Records, M.D.   DATE OF BIRTH:  08/21/1926   DATE OF ADMISSION:  06/20/2006  DATE OF DISCHARGE:  06/21/2006                              HISTORY & PHYSICAL   CHIEF COMPLAINT:  Shortness of breath.   Bryan Hatfield is a 77 year old male patient of Dr. Verdis Hatfield who underwent  a cardiac catheterization in January, 2008 for chest pain.  He  apparently had chest pain back in 2001 and underwent a PTCA at that  time.  Routinely, he has been followed by [facility] since that time.  Unfortunately, on June 20, 2006 he began having more substernal chest  pain and ultimately he went to the hospital.  He also, ultimately,  underwent cardiac catheterization that showed non-obstructive disease  and normal LV function.  He had mildly increased LVEDP of 25.  He was  maintained on low-dose Lasix.  Over the past week, he has noticed  increased shortness of breath.  He has had some yellow, productive  sputum.  He has also had associated dizziness.  He sleeps in a chair for  breathing purposes but does not think this has changed over the past  several months and sleeping in the chair is not new.  He had a chest x-  ray performed today prior to his office visit that showed cardiomegaly  with bibasilar collapse/consolidation, left greater than right.  He had  a moderate left pleural effusion with a small right pleural effusion.  No evidence of pulmonary edema.  The patient has been seen in the office  and with these findings, as well as his acute symptoms, he was admitted  to the hospital.   PAST MEDICAL HISTORY:  1. Anxiety.  2. Insomnia.  3. Diabetes mellitus with diet control.  4. GERD.  5. Hyperlipidemia.  6. Status post hemorrhoidectomy.  7. Left leg fracture in the 1980s.   FAMILY HISTORY:  Both  parents had a history of strokes.   SOCIAL HISTORY:  Former smoker.  No alcohol use.  She has 2 children,  married and works at [employer].   ALLERGIES:  No known drug allergies.   MEDICATIONS:  1. Lasix 30 mg a day.  2. Zocor 80 mg a day.  3. Lisinopril 10 mg a day.  4. Zoloft 10 mg a day.  5. Baby aspirin daily.  6. Sublingual nitroglycerin p.r.n.  7. Clonazepam 1 mg b.i.d.  8. Oxybutynin 5 mg b.i.d.  9. Omeprazole 20 mg a day.  10.Temazepam 30 mg q. h.s.  11.Multivitamins daily.  12.Advil p.r.n.   VITAL SIGNS:  Blood pressure 104/52, pulse 76, height 5 feet 11 inches,  weight 254.  HEENT:  Grossly normal.  No JVD or thyromegaly.  LUNGS:  Bibasilar crackles, decreased breath sounds, left greater than  right.  HEART:  Regular rate and rhythm.  No rub.  ABDOMEN:  Soft, nontender, no masses.  EXTREMITIES:  Lower extremity trace edema.  NEUROLOGIC:  Cranial nerves II-XII grossly intact.  SKIN:  Warm and dry.   ASSESSMENT/PLAN:  1. Volume overload with left pleural effusion.  History of normal      ejection fraction.  Will check chest x-ray including a left      lateral.  Perform chest CT.  Creatinine normal.  Recheck an      echocardiogram.  Pulmonary consult has been requested through the      efforts of Dr. Delton Coombes.  2. Diabetes mellitus, diet controlled.  3. Gastroesophageal reflux disease.  4. Hyperlipidemia.  5. Anxiety.   Currently the patient is intravascularly volume depleted and therefore  will not place him on Lasix until the full workup has been complete.  Check electrolytes as noted above.  Also check a BNP and d-dimer.   The patient was seen and examined by Dr. Verdis Hatfield.      Bryan Hatfield, P.A.      Lyn Records, M.D.  Electronically Signed    LB/MEDQ  D:  07/07/2006  T:  07/07/2006  Job:  [id number]   cc:   Lyn Records, M.D.  Leslye Peer, MD

## 2011-05-09 ENCOUNTER — Emergency Department (HOSPITAL_COMMUNITY)
Admission: EM | Admit: 2011-05-09 | Discharge: 2011-05-09 | Disposition: A | Discharge status: Home / Self Care | Payer: Medicare Other | Attending: Emergency Medicine | Admitting: Emergency Medicine

## 2011-05-09 ENCOUNTER — Encounter: Payer: Self-pay | Admitting: Emergency Medicine

## 2011-05-09 ENCOUNTER — Emergency Department (HOSPITAL_COMMUNITY): Payer: Medicare Other

## 2011-05-09 DIAGNOSIS — Z79899 Other long term (current) drug therapy: Secondary | ICD-10-CM | POA: Insufficient documentation

## 2011-05-09 DIAGNOSIS — R05 Cough: Secondary | ICD-10-CM | POA: Insufficient documentation

## 2011-05-09 DIAGNOSIS — J3489 Other specified disorders of nose and nasal sinuses: Secondary | ICD-10-CM | POA: Insufficient documentation

## 2011-05-09 DIAGNOSIS — R0989 Other specified symptoms and signs involving the circulatory and respiratory systems: Secondary | ICD-10-CM | POA: Insufficient documentation

## 2011-05-09 DIAGNOSIS — Z7982 Long term (current) use of aspirin: Secondary | ICD-10-CM | POA: Insufficient documentation

## 2011-05-09 DIAGNOSIS — R062 Wheezing: Secondary | ICD-10-CM

## 2011-05-09 DIAGNOSIS — J40 Bronchitis, not specified as acute or chronic: Secondary | ICD-10-CM | POA: Insufficient documentation

## 2011-05-09 DIAGNOSIS — R059 Cough, unspecified: Secondary | ICD-10-CM | POA: Insufficient documentation

## 2011-05-09 MED ORDER — ALBUTEROL SULFATE (5 MG/ML) 0.5% IN NEBU
5.0000 mg | INHALATION_SOLUTION | Freq: Once | RESPIRATORY_TRACT | Status: AC
  Administered 2011-05-09: 5 mg via RESPIRATORY_TRACT
  Filled 2011-05-09: qty 1

## 2011-05-09 MED ORDER — ALBUTEROL SULFATE HFA 108 (90 BASE) MCG/ACT IN AERS
2.0000 | INHALATION_SPRAY | RESPIRATORY_TRACT | Status: DC | PRN
  Administered 2011-05-09: 2 via RESPIRATORY_TRACT
  Filled 2011-05-09: qty 6.7

## 2011-05-09 NOTE — ED Provider Notes (Signed)
History     CSN: [id number] Arrival date & time: 05/09/2011  2:02 PM   First MD Initiated Contact with Patient 05/09/11 1521      Chief Complaint  Patient presents with  . Wheezing  . Nasal Congestion    (Consider location/radiation/quality/duration/timing/severity/associated sxs/prior treatment) Patient is a 77 y.o. male presenting with wheezing. The history is provided by the patient.  Wheezing  Associated symptoms include wheezing. Pertinent negatives include no chest pain, no fever and no shortness of breath.  pt c/o non prod cough, chest congestion, occasional wheezing in past week. Cough episodic. No specific exacerbating or allev factors. No hx asthma or copd. Non smoker. No fever or chills. Has seen [facility] 2. Completed 1 week of unspec abx , and started on amox yesterday. No orthopnea or pnd. No current or recent chest pain. No sore throat, runny nose, body aches or other uri c/o. No leg swelling. Denies recent change ni meds or new meds, x recent abx tx.   Past Medical History  Diagnosis Date  . Blockage of coronary artery of heart     Past Surgical History  Procedure Date  . Coronary angioplasty with stent placement     History reviewed. No pertinent family history.  History  Substance Use Topics  . Smoking status: Never Smoker   . Smokeless tobacco: Never Used  . Alcohol Use: No      Review of Systems  Constitutional: Negative for fever and chills.  HENT: Negative for neck pain.   Eyes: Negative for redness.  Respiratory: Positive for wheezing. Negative for shortness of breath.   Cardiovascular: Negative for chest pain and leg swelling.  Gastrointestinal: Negative for abdominal pain.  Genitourinary: Negative for flank pain.  Musculoskeletal: Negative for back pain.  Skin: Negative for rash.  Neurological: Negative for headaches.  Hematological: Does not bruise/bleed easily.  Psychiatric/Behavioral: Negative for confusion.    Allergies  Review of  patient's allergies indicates no known allergies.  Home Medications   Current Outpatient Rx  Name Route Sig Dispense Refill  . AMOXICILLIN-POT CLAVULANATE 875-125 MG PO TABS Oral Take 1 tablet by mouth 2 (two) times daily. Started today (05-09-11) and should continue until empty     . ASPIRIN EC 81 MG PO TBEC Oral Take 81 mg by mouth daily.      Marland Kitchen CETIRIZINE HCL 10 MG PO TABS Oral Take 10 mg by mouth daily.      Marland Kitchen CLONAZEPAM 1 MG PO TABS Oral Take 1 mg by mouth 2 (two) times daily as needed.      . FUROSEMIDE 40 MG PO TABS Oral Take 40 mg by mouth every other day. Usually takes on MWF     . LISINOPRIL 10 MG PO TABS Oral Take 5 mg by mouth daily.      Marland Kitchen OMEPRAZOLE 20 MG PO CPDR Oral Take 20 mg by mouth 2 (two) times daily.      . SERTRALINE HCL 100 MG PO TABS Oral Take 100 mg by mouth daily.      Marland Kitchen SIMVASTATIN 80 MG PO TABS Oral Take 80 mg by mouth at bedtime.      . TERAZOSIN HCL 2 MG PO CAPS Oral Take 2 mg by mouth at bedtime.      Marland Kitchen NITROGLYCERIN 0.4 MG SL SUBL Sublingual Place 0.4 mg under the tongue every 5 (five) minutes as needed.        BP 116/57  Pulse 83  Temp(Src) 97.7 F (36.5 C) (Oral)  Resp 16  Ht 5\' 11"  (1.803 m)  Wt 265 lb (120.203 kg)  BMI 36.96 kg/m2  SpO2 94%  Physical Exam  Nursing note and vitals reviewed. Constitutional: He is oriented to person, place, and time. He appears well-developed and well-nourished. No distress.  HENT:  Head: Atraumatic.  Mouth/Throat: Oropharynx is clear and moist.  Eyes: Pupils are equal, round, and reactive to light.  Neck: Neck supple. No tracheal deviation present.  Cardiovascular: Normal rate, regular rhythm, normal heart sounds and intact distal pulses.  Exam reveals no gallop and no friction rub.   No murmur heard. Pulmonary/Chest: Effort normal. No accessory muscle usage. No respiratory distress. He has wheezes.  Abdominal: Soft. Bowel sounds are normal. He exhibits no distension. There is no tenderness.  Musculoskeletal:  Normal range of motion. He exhibits no edema and no tenderness.  Lymphadenopathy:    He has no cervical adenopathy.  Neurological: He is alert and oriented to person, place, and time.  Skin: Skin is warm and dry.  Psychiatric: He has a normal mood and affect.    ED Course  Procedures (including critical care time)   MDM  Nursing notes reviewed. Xrays. Alb neb.   Delay in having films read/radiology notified.  Radiologist/stahl indicates no pna, no chf.   Alb neb as mild wheezing. Pt just started on abx by pcp yesterday, will complete. Will get mdi for home.       Suzi Roots, MD 05/09/11 [phone]

## 2011-05-09 NOTE — ED Notes (Signed)
Pt reports seen by PMD last Friday for congestion and wheezing. Pt given prescribed meds at that time. Pt continued to have symptoms and Dr gave him more medication. Symptoms not improving.

## 2011-05-22 ENCOUNTER — Telehealth: Payer: Self-pay | Admitting: Pulmonary Disease

## 2011-05-22 ENCOUNTER — Encounter (HOSPITAL_COMMUNITY): Payer: Self-pay

## 2011-05-22 ENCOUNTER — Emergency Department (INDEPENDENT_AMBULATORY_CARE_PROVIDER_SITE_OTHER)
Admission: EM | Admit: 2011-05-22 | Discharge: 2011-05-22 | Disposition: A | Discharge status: Home / Self Care | Payer: Medicare Other | Source: Home / Self Care | Attending: Emergency Medicine | Admitting: Emergency Medicine

## 2011-05-22 DIAGNOSIS — R5383 Other fatigue: Secondary | ICD-10-CM

## 2011-05-22 DIAGNOSIS — R05 Cough: Secondary | ICD-10-CM

## 2011-05-22 DIAGNOSIS — R5381 Other malaise: Secondary | ICD-10-CM

## 2011-05-22 HISTORY — DX: Essential (primary) hypertension: I10

## 2011-05-22 MED ORDER — PREDNISONE 20 MG PO TABS: 40.0000 mg | ORAL_TABLET | Freq: Every day | ORAL | Status: AC

## 2011-05-22 NOTE — Telephone Encounter (Signed)
[email or web address].Jennifer Castillo, CMA  

## 2011-05-22 NOTE — ED Provider Notes (Addendum)
History     CSN: [id number]  Arrival date & time 05/22/11  1049   First MD Initiated Contact with Patient 05/22/11 1051      Chief Complaint  Patient presents with  . Cough    (Consider location/radiation/quality/duration/timing/severity/associated sxs/prior treatment) HPI Comments: No fevers, No SOB (wife states), I brought him cause he has been feeling tired and its still coughing, he is better and took two antibiotic CYCLES. his doctor told him to stop cause he said was a viral infection so we did, bu the cough is not done,  Patient is a 77 y.o. male presenting with cough. The history is provided by the patient and the spouse.  Cough This is a recurrent problem. The current episode started more than 1 week ago. The problem occurs hourly. The problem has not changed since onset.The cough is non-productive. There has been no fever. Associated symptoms include chills and rhinorrhea. Pertinent negatives include no weight loss, no ear congestion, no headaches, no myalgias and no shortness of breath. He has tried nothing for the symptoms. His past medical history does not include pneumonia.    Past Medical History  Diagnosis Date  . Blockage of coronary artery of heart   . Hypertension   . Diabetes mellitus     Past Surgical History  Procedure Date  . Coronary angioplasty with stent placement   . Hemorrhoid surgery     No family history on file.  History  Substance Use Topics  . Smoking status: Never Smoker   . Smokeless tobacco: Never Used  . Alcohol Use: No      Review of Systems  Constitutional: Positive for chills and activity change. Negative for weight loss, diaphoresis and fatigue.  HENT: Positive for rhinorrhea. Negative for congestion and ear discharge.   Respiratory: Positive for cough. Negative for choking, chest tightness, shortness of breath and stridor.   Musculoskeletal: Negative for myalgias.  Neurological: Negative for headaches.    Allergies    Review of patient's allergies indicates no known allergies.  Home Medications   Current Outpatient Rx  Name Route Sig Dispense Refill  . ASPIRIN EC 81 MG PO TBEC Oral Take 81 mg by mouth daily.      Marland Kitchen CETIRIZINE HCL 10 MG PO TABS Oral Take 10 mg by mouth daily.      Marland Kitchen CLONAZEPAM 1 MG PO TABS Oral Take 1 mg by mouth 2 (two) times daily as needed.      . FUROSEMIDE 40 MG PO TABS Oral Take 40 mg by mouth every other day. Usually takes on MWF     . LISINOPRIL 10 MG PO TABS Oral Take 5 mg by mouth daily.      Marland Kitchen NITROGLYCERIN 0.4 MG SL SUBL Sublingual Place 0.4 mg under the tongue every 5 (five) minutes as needed.      Marland Kitchen OMEPRAZOLE 20 MG PO CPDR Oral Take 20 mg by mouth 2 (two) times daily.      . SERTRALINE HCL 100 MG PO TABS Oral Take 100 mg by mouth daily.      Marland Kitchen SIMVASTATIN 80 MG PO TABS Oral Take 80 mg by mouth at bedtime.      . TERAZOSIN HCL 2 MG PO CAPS Oral Take 2 mg by mouth at bedtime.      . AMOXICILLIN-POT CLAVULANATE 875-125 MG PO TABS Oral Take 1 tablet by mouth 2 (two) times daily. Started today (05-09-11) and should continue until empty     . PREDNISONE 20  MG PO TABS Oral Take 2 tablets (40 mg total) by mouth daily. 10 tablet 0    BP 116/67  Pulse 67  Temp(Src) 97.7 F (36.5 C) (Oral)  Resp 18  SpO2 94%  Physical Exam  Nursing note and vitals reviewed. Constitutional: He is oriented to person, place, and time. He appears well-developed and well-nourished. No distress.  HENT:  Right Ear: Tympanic membrane normal.  Left Ear: Tympanic membrane normal.  Cardiovascular: Normal rate.   Pulmonary/Chest: Effort normal and breath sounds normal. No respiratory distress. He has no decreased breath sounds. He has no wheezes. He has no rales.  Neurological: He is alert and oriented to person, place, and time. No cranial nerve deficit or sensory deficit.  Skin: He is not diaphoretic.    ED Course  Procedures (including critical care time)  Labs Reviewed - No data to  display No results found.   1. Cough   2. Fatigue       MDM  Residual cough (no dyspnea) and fatigued        Jimmie Molly, MD 05/22/11 1912  Jimmie Molly, MD 05/22/11 [phone]

## 2011-05-22 NOTE — Telephone Encounter (Signed)
I spoke with the pt wife and she states that she took the pt to [facility]. She states she  Does not feel that he needs and appt at this time. Carron Curie, CMA

## 2011-05-22 NOTE — ED Notes (Signed)
C/o cough for 1 month.  Reports has been on 2 courses of antibiotics which helped his sinus sx but still has cough.  Was seen in ED 2 weeks ago and had chest xray.  Saw PCP last Thursday who said it was viral and stopped his antibiotics.  States chest feels congested, but has dry cough and fatigue.

## 2011-09-25 ENCOUNTER — Inpatient Hospital Stay (HOSPITAL_COMMUNITY)
Admission: EM | Admit: 2011-09-25 | Discharge: 2011-09-28 | DRG: 193 | Disposition: A | Discharge status: Home / Self Care | Payer: Medicare Other | Attending: Internal Medicine | Admitting: Internal Medicine

## 2011-09-25 ENCOUNTER — Encounter (HOSPITAL_COMMUNITY): Payer: Self-pay | Admitting: Emergency Medicine

## 2011-09-25 ENCOUNTER — Inpatient Hospital Stay (HOSPITAL_COMMUNITY): Payer: Medicare Other

## 2011-09-25 ENCOUNTER — Emergency Department (INDEPENDENT_AMBULATORY_CARE_PROVIDER_SITE_OTHER)
Admission: EM | Admit: 2011-09-25 | Discharge: 2011-09-25 | Disposition: A | Discharge status: Nursing Home (Includes ICF) | Payer: Medicare Other | Source: Home / Self Care | Attending: Family Medicine | Admitting: Family Medicine

## 2011-09-25 ENCOUNTER — Encounter (HOSPITAL_COMMUNITY): Payer: Self-pay | Admitting: *Deleted

## 2011-09-25 ENCOUNTER — Emergency Department (INDEPENDENT_AMBULATORY_CARE_PROVIDER_SITE_OTHER): Payer: Medicare Other

## 2011-09-25 DIAGNOSIS — R0602 Shortness of breath: Secondary | ICD-10-CM

## 2011-09-25 DIAGNOSIS — M7989 Other specified soft tissue disorders: Secondary | ICD-10-CM | POA: Diagnosis present

## 2011-09-25 DIAGNOSIS — K219 Gastro-esophageal reflux disease without esophagitis: Secondary | ICD-10-CM | POA: Diagnosis present

## 2011-09-25 DIAGNOSIS — E7800 Pure hypercholesterolemia, unspecified: Secondary | ICD-10-CM | POA: Diagnosis present

## 2011-09-25 DIAGNOSIS — Z7982 Long term (current) use of aspirin: Secondary | ICD-10-CM

## 2011-09-25 DIAGNOSIS — Z79899 Other long term (current) drug therapy: Secondary | ICD-10-CM

## 2011-09-25 DIAGNOSIS — J9620 Acute and chronic respiratory failure, unspecified whether with hypoxia or hypercapnia: Secondary | ICD-10-CM | POA: Diagnosis present

## 2011-09-25 DIAGNOSIS — I252 Old myocardial infarction: Secondary | ICD-10-CM

## 2011-09-25 DIAGNOSIS — R609 Edema, unspecified: Secondary | ICD-10-CM | POA: Diagnosis present

## 2011-09-25 DIAGNOSIS — E669 Obesity, unspecified: Secondary | ICD-10-CM

## 2011-09-25 DIAGNOSIS — N390 Urinary tract infection, site not specified: Secondary | ICD-10-CM | POA: Diagnosis present

## 2011-09-25 DIAGNOSIS — R0902 Hypoxemia: Secondary | ICD-10-CM

## 2011-09-25 DIAGNOSIS — J9621 Acute and chronic respiratory failure with hypoxia: Secondary | ICD-10-CM | POA: Diagnosis present

## 2011-09-25 DIAGNOSIS — I739 Peripheral vascular disease, unspecified: Secondary | ICD-10-CM | POA: Diagnosis present

## 2011-09-25 DIAGNOSIS — J9600 Acute respiratory failure, unspecified whether with hypoxia or hypercapnia: Secondary | ICD-10-CM

## 2011-09-25 DIAGNOSIS — F411 Generalized anxiety disorder: Secondary | ICD-10-CM | POA: Diagnosis present

## 2011-09-25 DIAGNOSIS — J189 Pneumonia, unspecified organism: Principal | ICD-10-CM

## 2011-09-25 DIAGNOSIS — I10 Essential (primary) hypertension: Secondary | ICD-10-CM | POA: Diagnosis present

## 2011-09-25 DIAGNOSIS — IMO0002 Reserved for concepts with insufficient information to code with codable children: Secondary | ICD-10-CM

## 2011-09-25 HISTORY — DX: Acute respiratory failure, unspecified whether with hypoxia or hypercapnia: J96.00

## 2011-09-25 HISTORY — DX: Sleep apnea, unspecified: G47.30

## 2011-09-25 HISTORY — DX: Peripheral vascular disease, unspecified: I73.9

## 2011-09-25 HISTORY — DX: Pure hypercholesterolemia, unspecified: E78.00

## 2011-09-25 HISTORY — DX: Anxiety disorder, unspecified: F41.9

## 2011-09-25 LAB — CBC
HCT: 33.4 % — ABNORMAL LOW (ref 39.0–52.0)
Hemoglobin: 11.1 g/dL — ABNORMAL LOW (ref 13.0–17.0)
MCH: 30.3 pg (ref 26.0–34.0)
MCHC: 33.2 g/dL (ref 30.0–36.0)
MCV: 91.3 fL (ref 78.0–100.0)
Platelets: 191 10*3/uL (ref 150–400)
RBC: 3.66 MIL/uL — ABNORMAL LOW (ref 4.22–5.81)
RDW: 13.2 % (ref 11.5–15.5)
WBC: 9.5 10*3/uL (ref 4.0–10.5)

## 2011-09-25 LAB — PRO B NATRIURETIC PEPTIDE: Pro B Natriuretic peptide (BNP): 648.3 pg/mL — ABNORMAL HIGH (ref 0–450)

## 2011-09-25 LAB — URINALYSIS, ROUTINE W REFLEX MICROSCOPIC
Glucose, UA: NEGATIVE mg/dL
Hgb urine dipstick: NEGATIVE
Ketones, ur: 15 mg/dL — AB
Nitrite: POSITIVE — AB
Protein, ur: 100 mg/dL — AB
Specific Gravity, Urine: 1.029 (ref 1.005–1.030)
Urobilinogen, UA: 4 mg/dL — ABNORMAL HIGH (ref 0.0–1.0)
pH: 6 (ref 5.0–8.0)

## 2011-09-25 LAB — INFLUENZA PANEL BY PCR (TYPE A & B)
H1N1 flu by pcr: NOT DETECTED
Influenza B By PCR: NEGATIVE

## 2011-09-25 LAB — BASIC METABOLIC PANEL
BUN: 22 mg/dL (ref 6–23)
CO2: 26 mEq/L (ref 19–32)
Calcium: 8.2 mg/dL — ABNORMAL LOW (ref 8.4–10.5)
Chloride: 98 mEq/L (ref 96–112)
Creatinine, Ser: 1.04 mg/dL (ref 0.50–1.35)
GFR calc Af Amer: 75 mL/min — ABNORMAL LOW (ref >90)
GFR calc non Af Amer: 64 mL/min — ABNORMAL LOW (ref >90)
Glucose, Bld: 131 mg/dL — ABNORMAL HIGH (ref 70–99)
Potassium: 3.5 mEq/L (ref 3.5–5.1)
Sodium: 136 mEq/L (ref 135–145)

## 2011-09-25 LAB — URINE MICROSCOPIC-ADD ON

## 2011-09-25 LAB — TROPONIN I: Troponin I: <0.3 ng/mL (ref <0.30)

## 2011-09-25 MED ORDER — PANTOPRAZOLE SODIUM 40 MG PO TBEC
40.0000 mg | DELAYED_RELEASE_TABLET | Freq: Every day | ORAL | Status: DC
  Administered 2011-09-26 – 2011-09-28 (×3): 40 mg via ORAL
  Filled 2011-09-25: qty 1

## 2011-09-25 MED ORDER — ONDANSETRON HCL 4 MG PO TABS: 4.0000 mg | ORAL_TABLET | Freq: Four times a day (QID) | ORAL | Status: DC | PRN

## 2011-09-25 MED ORDER — IOHEXOL 350 MG/ML SOLN
100.0000 mL | Freq: Once | INTRAVENOUS | Status: AC | PRN
  Administered 2011-09-25: 100 mL via INTRAVENOUS

## 2011-09-25 MED ORDER — ALBUTEROL SULFATE (5 MG/ML) 0.5% IN NEBU
2.5000 mg | INHALATION_SOLUTION | Freq: Four times a day (QID) | RESPIRATORY_TRACT | Status: DC | PRN
  Administered 2011-09-25: 2.5 mg via RESPIRATORY_TRACT

## 2011-09-25 MED ORDER — DOCUSATE SODIUM 100 MG PO CAPS
100.0000 mg | ORAL_CAPSULE | Freq: Two times a day (BID) | ORAL | Status: DC
  Administered 2011-09-25 – 2011-09-28 (×6): 100 mg via ORAL
  Filled 2011-09-25 (×8): qty 1

## 2011-09-25 MED ORDER — ONDANSETRON HCL 4 MG/2ML IJ SOLN: 4.0000 mg | Freq: Three times a day (TID) | INTRAMUSCULAR | Status: DC | PRN

## 2011-09-25 MED ORDER — DEXTROSE 5 % IV SOLN
1.0000 g | INTRAVENOUS | Status: DC
  Administered 2011-09-26 – 2011-09-27 (×2): 1 g via INTRAVENOUS
  Filled 2011-09-25 (×2): qty 10

## 2011-09-25 MED ORDER — ALBUTEROL SULFATE (5 MG/ML) 0.5% IN NEBU
5.0000 mg | INHALATION_SOLUTION | Freq: Once | RESPIRATORY_TRACT | Status: AC
Start: 2011-09-25 — End: 2011-09-25
  Administered 2011-09-25: 5 mg via RESPIRATORY_TRACT
  Filled 2011-09-25: qty 1

## 2011-09-25 MED ORDER — DEXTROSE 5 % IV SOLN
1.0000 g | Freq: Once | INTRAVENOUS | Status: AC
  Administered 2011-09-25: 1 g via INTRAVENOUS
  Filled 2011-09-25: qty 10

## 2011-09-25 MED ORDER — MORPHINE SULFATE 2 MG/ML IJ SOLN: 2.0000 mg | INTRAMUSCULAR | Status: DC | PRN

## 2011-09-25 MED ORDER — TERAZOSIN HCL 2 MG PO CAPS
2.0000 mg | ORAL_CAPSULE | Freq: Every day | ORAL | Status: DC
  Administered 2011-09-25 – 2011-09-27 (×3): 2 mg via ORAL
  Filled 2011-09-25 (×5): qty 1

## 2011-09-25 MED ORDER — ASPIRIN EC 81 MG PO TBEC
81.0000 mg | DELAYED_RELEASE_TABLET | Freq: Every day | ORAL | Status: DC
  Administered 2011-09-26 – 2011-09-28 (×3): 81 mg via ORAL
  Filled 2011-09-25 (×4): qty 1

## 2011-09-25 MED ORDER — ACETAMINOPHEN 650 MG RE SUPP: 650.0000 mg | Freq: Four times a day (QID) | RECTAL | Status: DC | PRN

## 2011-09-25 MED ORDER — CLONAZEPAM 1 MG PO TABS
1.0000 mg | ORAL_TABLET | Freq: Two times a day (BID) | ORAL | Status: DC | PRN
  Administered 2011-09-26 – 2011-09-27 (×2): 1 mg via ORAL
  Filled 2011-09-25 (×3): qty 1

## 2011-09-25 MED ORDER — FUROSEMIDE 40 MG PO TABS
40.0000 mg | ORAL_TABLET | ORAL | Status: DC
Start: 2011-09-25 — End: 2011-09-28
  Administered 2011-09-28: 40 mg via ORAL
  Filled 2011-09-25 (×2): qty 1

## 2011-09-25 MED ORDER — METHYLPREDNISOLONE SODIUM SUCC 125 MG IJ SOLR
125.0000 mg | Freq: Four times a day (QID) | INTRAMUSCULAR | Status: DC
  Administered 2011-09-25 – 2011-09-26 (×3): 125 mg via INTRAVENOUS
  Filled 2011-09-25 (×8): qty 2

## 2011-09-25 MED ORDER — SODIUM CHLORIDE 0.9 % IV SOLN
INTRAVENOUS | Status: DC
  Administered 2011-09-25: 1000 mL/h via INTRAVENOUS

## 2011-09-25 MED ORDER — AZITHROMYCIN 500 MG IV SOLR
500.0000 mg | Freq: Once | INTRAVENOUS | Status: DC
  Filled 2011-09-25: qty 500

## 2011-09-25 MED ORDER — OSELTAMIVIR PHOSPHATE 75 MG PO CAPS
75.0000 mg | ORAL_CAPSULE | Freq: Two times a day (BID) | ORAL | Status: DC
  Administered 2011-09-25: 75 mg via ORAL
  Filled 2011-09-25 (×4): qty 1

## 2011-09-25 MED ORDER — NITROGLYCERIN 0.4 MG SL SUBL: 0.4000 mg | SUBLINGUAL_TABLET | SUBLINGUAL | Status: DC | PRN

## 2011-09-25 MED ORDER — DEXTROSE 5 % IV SOLN
500.0000 mg | INTRAVENOUS | Status: AC
  Administered 2011-09-25 – 2011-09-27 (×3): 500 mg via INTRAVENOUS
  Filled 2011-09-25 (×4): qty 500

## 2011-09-25 MED ORDER — POTASSIUM CHLORIDE 20 MEQ/15ML (10%) PO LIQD
40.0000 meq | Freq: Once | ORAL | Status: AC
  Administered 2011-09-25: 40 meq via ORAL
  Filled 2011-09-25: qty 30

## 2011-09-25 MED ORDER — ALBUTEROL SULFATE (5 MG/ML) 0.5% IN NEBU: 2.5000 mg | INHALATION_SOLUTION | RESPIRATORY_TRACT | Status: DC | PRN

## 2011-09-25 MED ORDER — ALBUTEROL SULFATE (5 MG/ML) 0.5% IN NEBU: 2.5000 mg | INHALATION_SOLUTION | Freq: Four times a day (QID) | RESPIRATORY_TRACT | Status: DC

## 2011-09-25 MED ORDER — ACETAMINOPHEN 325 MG PO TABS
650.0000 mg | ORAL_TABLET | Freq: Four times a day (QID) | ORAL | Status: DC | PRN
Start: 2011-09-25 — End: 2011-09-28

## 2011-09-25 MED ORDER — ENOXAPARIN SODIUM 40 MG/0.4ML ~~LOC~~ SOLN
40.0000 mg | Freq: Every day | SUBCUTANEOUS | Status: DC
  Administered 2011-09-25 – 2011-09-27 (×3): 40 mg via SUBCUTANEOUS
  Filled 2011-09-25 (×5): qty 0.4

## 2011-09-25 MED ORDER — ONDANSETRON HCL 4 MG/2ML IJ SOLN: 4.0000 mg | Freq: Four times a day (QID) | INTRAMUSCULAR | Status: DC | PRN

## 2011-09-25 MED ORDER — GUAIFENESIN ER 600 MG PO TB12
1200.0000 mg | ORAL_TABLET | Freq: Two times a day (BID) | ORAL | Status: DC
  Administered 2011-09-25 – 2011-09-28 (×6): 1200 mg via ORAL
  Filled 2011-09-25 (×8): qty 2

## 2011-09-25 MED ORDER — SERTRALINE HCL 100 MG PO TABS
100.0000 mg | ORAL_TABLET | Freq: Every day | ORAL | Status: DC
  Administered 2011-09-26 – 2011-09-28 (×3): 100 mg via ORAL
  Filled 2011-09-25 (×5): qty 1

## 2011-09-25 MED ORDER — SODIUM CHLORIDE 0.9 % IV SOLN
INTRAVENOUS | Status: DC
  Administered 2011-09-25 (×2): via INTRAVENOUS

## 2011-09-25 MED ORDER — ALBUTEROL SULFATE (5 MG/ML) 0.5% IN NEBU
2.5000 mg | INHALATION_SOLUTION | Freq: Two times a day (BID) | RESPIRATORY_TRACT | Status: DC
  Administered 2011-09-26 – 2011-09-28 (×5): 2.5 mg via RESPIRATORY_TRACT
  Filled 2011-09-25 (×6): qty 0.5

## 2011-09-25 MED ORDER — IPRATROPIUM BROMIDE 0.02 % IN SOLN
0.5000 mg | Freq: Once | RESPIRATORY_TRACT | Status: AC
  Administered 2011-09-25: 0.5 mg via RESPIRATORY_TRACT
  Filled 2011-09-25: qty 2.5

## 2011-09-25 NOTE — ED Notes (Signed)
Pt with approx month hx of dry cough, sob with ambulation and lower leg swelling. Pt states he was sent from [facility] for further eval. Denies any chest pain.

## 2011-09-25 NOTE — ED Notes (Signed)
PT HERE WITH SINUS SX THAT STARTED FOR X 1 MONTH BUT HAS PROGRESSIVELY GOTTEN WORSE WITH CONSTANT COUGH,GRAYISH PHLEGM AND SOB.SATS 91% R/A LABORED BREATHING BUT NO DISTRESS.NO HX LUNG DISEASE.MUCINEX AND SALT WATER.

## 2011-09-25 NOTE — ED Notes (Signed)
Pt to vascular to have his duplex done.

## 2011-09-25 NOTE — ED Notes (Signed)
Pt taken to xray 

## 2011-09-25 NOTE — H&P (Signed)
PCP:   Delorse Lek, MD, MD   Chief Complaint:  Shortness of breath, cough for 3 days  HPI: [name] comes in with cough productive of greenish colored phlegm, shortness of breath, not associated with fever after a trip to [place]. When he was in [place], he fell and bruised his right leg, which is swollen but has no dvt per ultrasound done this afternoon. In ED he was found to be hypoxic with O2 sat in the low 80's at presentation. A cxr showed "1. Mild diffuse interstitial prominence which appears to predominately reflects some central airway thickening. In addition, there are two small ill-defined nodular opacities in the periphery of the left upper lobe. Overall appearance is suggestive of bronchitis, possibly with developing multifocal bronchopneumonia. 2. Given the presence of the ill-defined nodules in the left upper lobe, repeat radiographs after the acute illness has resolved is highly recommended to ensure that the nodules in the left upper lobe have also resolved." He was given ceftriaxone/zithromax, and referred for admission. He says his wife has been coughing too. Denies diarrhea or chest pain.   Review of Systems:  Unremarkable except as high lighted in the hpi.  Past Medical History: Past Medical History  Diagnosis Date  . Blockage of coronary artery of heart   . Hypertension   . Diabetes mellitus    Past Surgical History  Procedure Date  . Coronary angioplasty with stent placement   . Hemorrhoid surgery     Medications: Prior to Admission medications   Medication Sig Start Date End Date Taking? Authorizing Provider  aspirin EC 81 MG tablet Take 81 mg by mouth daily.     Yes Historical Provider, MD  clonazePAM (KLONOPIN) 1 MG tablet Take 1 mg by mouth 2 (two) times daily as needed. For anxiety.   Yes Historical Provider, MD  furosemide (LASIX) 40 MG tablet Take 40 mg by mouth every other day. Usually takes on MWF    Yes Historical Provider, MD  lisinopril  (PRINIVIL,ZESTRIL) 10 MG tablet Take 5-10 mg by mouth daily.    Yes Historical Provider, MD  omeprazole (PRILOSEC) 20 MG capsule Take 20 mg by mouth 2 (two) times daily.     Yes Historical Provider, MD  PRESCRIPTION MEDICATION Apply 1 application topically daily. Clindamycin phosphate 1%   Yes Historical Provider, MD  PRESCRIPTION MEDICATION Apply 1 application topically daily. Clotrimazole cream   Yes Historical Provider, MD  sertraline (ZOLOFT) 100 MG tablet Take 100 mg by mouth daily.     Yes Historical Provider, MD  simvastatin (ZOCOR) 40 MG tablet Take 40 mg by mouth every evening.   Yes Historical Provider, MD  terazosin (HYTRIN) 1 MG capsule Take 2 mg by mouth at bedtime.   Yes Historical Provider, MD  nitroGLYCERIN (NITROSTAT) 0.4 MG SL tablet Place 0.4 mg under the tongue every 5 (five) minutes as needed.      Historical Provider, MD    Allergies:  No Known Allergies  Social History:  reports that he has never smoked. He has never used smokeless tobacco. He reports that he does not drink alcohol or use illicit drugs.  Family History: History reviewed. No pertinent family history.  Physical Exam: Filed Vitals:   09/25/11 1038 09/25/11 1040 09/25/11 1106 09/25/11 1152  BP: 126/60     Pulse:      Temp:      TempSrc:      Resp: 28     SpO2: 88% 94% 94% 94%   In  mild respiratory distress. No carotid bruits. No JVD. Wheezing, reduced air entry bilaterally. Scattered scant rhonchi. S1S2 heard. No murmurs. RRR. Abdomen- obese, soft, non tender. +BS. CNS- grossly Intact. Extremities- right leg swollen compared to left, with some pedal edema. Hominis negative.   Labs on Admission:   Basename 09/25/11 1120  NA 136  K 3.5  CL 98  CO2 26  GLUCOSE 131*  BUN 22  CREATININE 1.04  CALCIUM 8.2*  MG --  PHOS --   No results found for this basename: AST:2,ALT:2,ALKPHOS:2,BILITOT:2,PROT:2,ALBUMIN:2 in the last 72 hours No results found for this basename: LIPASE:2,AMYLASE:2 in the  last 72 hours  Basename 09/25/11 1120  WBC 9.5  NEUTROABS --  HGB 11.1*  HCT 33.4*  MCV 91.3  PLT 191    Basename 09/25/11 1120  CKTOTAL --  CKMB --  CKMBINDEX --  TROPONINI <0.30   No results found for this basename: TSH,T4TOTAL,FREET3,T3FREE,THYROIDAB in the last 72 hours No results found for this basename: VITAMINB12:2,FOLATE:2,FERRITIN:2,TIBC:2,IRON:2,RETICCTPCT:2 in the last 72 hours  Radiological Exams on Admission: Dg Chest 2 View  09/25/2011  *RADIOLOGY REPORT*  Clinical Data: Cough and shortness of breath for past 5 days.  CHEST - 2 VIEW  Comparison: Chest x-ray of 05/09/2011.  Findings: Lung volumes are low.  There is mild prominence of the interstitial markings diffusely, which appears to be predominantly reflective of thickening of the bronchi.  There are a couple of ill- defined peripheral nodules, most pronounced left upper lobe, favored to represent small areas of early airspace consolidation. No pleural effusions.  Pulmonary vasculature is within normal limits.  Heart size is mildly enlarged (unchanged).  Mediastinal contours are unremarkable.  IMPRESSION: 1.  Mild diffuse interstitial prominence which appears to predominately reflects some central airway thickening.  In addition, there are two small ill-defined nodular opacities in the periphery of the left upper lobe.  Overall appearance is suggestive of bronchitis, possibly with developing multifocal bronchopneumonia. 2.  Given the presence of the ill-defined nodules in the left upper lobe, repeat radiographs after the acute illness has resolved is highly recommended to ensure that the nodules in the left upper lobe have also resolved.  Original Report Authenticated By: Florencia Reasons, M.D.    Assessment .Acute and chronic respiratory failure with hypoxic respiratory failure in elderly male with URTI like symptoms who recently had traveled to [place]. He has right leg swelling, fortunately not dvt- ?cellulitis.  Differentials include acute on chronic bronchitis(viral vs bacterial in origin- ?the flu) versus bacterial bronchopneumonia versus PE Plan .Bronchitis, acute/hypoxic respiratory failure- admit med/surg, droplet precautions/flu swab, respiratory culture/ct chest r/o pe. Tamiflu/ceftriaxone/zithromax/bronchodilators/steroids/O2/lovenox till ct chest bag. .Leg swelling- ?cellulitis, will be on abx. .Obesity- bmi>40 .Htn-hold acei due to acute respiratory status. start norvasc. Genella Rife- ppi Condition closely guarded.  Aniruddh Ciavarella [phone]. 09/25/2011, 2:09 PM

## 2011-09-25 NOTE — Progress Notes (Signed)
09/25/2011 patient home with wife, came from emergency room to 6700. He is alert and oriented uses a cane to ambulate. Patient complain of weakness. Patient is a fall risk, stated twice. Patient have swelling in bilateral lower ankle, the right more than left. Patient bottom is pink, it does blanches. In the crack the skin is white, but no breakage. Left sacrum and left side abdomen scratches. Bruise on right ac. The right elbow scab., also heels dry. Biochemist, clinical.

## 2011-09-25 NOTE — Progress Notes (Signed)
*  PRELIMINARY RESULTS* Vascular Ultrasound Right lower extremity venous duplex has been completed.  Preliminary findings: Right= No evidence of DVT or baker's cyst.  Farrel Demark, RDMS 09/25/2011, 12:25 PM

## 2011-09-25 NOTE — ED Provider Notes (Signed)
History     CSN: [id number]  Arrival date & time 09/25/11  8295   First MD Initiated Contact with Patient 09/25/11 [phone]      Chief Complaint  Patient presents with  . Bronchitis  . Sinusitis    (Consider location/radiation/quality/duration/timing/severity/associated sxs/prior treatment) Patient is a 78 y.o. male presenting with cough. The history is provided by the patient.  Cough This is a new problem. The current episode started more than 1 week ago (approx 1 mo of cough, sob worsening.). The problem has been gradually worsening. The cough is productive of sputum. Associated symptoms include shortness of breath.    Past Medical History  Diagnosis Date  . Blockage of coronary artery of heart   . Hypertension   . Diabetes mellitus     Past Surgical History  Procedure Date  . Coronary angioplasty with stent placement   . Hemorrhoid surgery     No family history on file.  History  Substance Use Topics  . Smoking status: Never Smoker   . Smokeless tobacco: Never Used  . Alcohol Use: No      Review of Systems  HENT: Positive for congestion.   Respiratory: Positive for cough and shortness of breath.     Allergies  Review of patient's allergies indicates no known allergies.  Home Medications   Current Outpatient Rx  Name Route Sig Dispense Refill  . AMOXICILLIN-POT CLAVULANATE 875-125 MG PO TABS Oral Take 1 tablet by mouth 2 (two) times daily. Started today (05-09-11) and should continue until empty     . ASPIRIN EC 81 MG PO TBEC Oral Take 81 mg by mouth daily.      Marland Kitchen CETIRIZINE HCL 10 MG PO TABS Oral Take 10 mg by mouth daily.      Marland Kitchen CLONAZEPAM 1 MG PO TABS Oral Take 1 mg by mouth 2 (two) times daily as needed.      . FUROSEMIDE 40 MG PO TABS Oral Take 40 mg by mouth every other day. Usually takes on MWF     . LISINOPRIL 10 MG PO TABS Oral Take 5 mg by mouth daily.      Marland Kitchen NITROGLYCERIN 0.4 MG SL SUBL Sublingual Place 0.4 mg under the tongue every 5 (five)  minutes as needed.      Marland Kitchen OMEPRAZOLE 20 MG PO CPDR Oral Take 20 mg by mouth 2 (two) times daily.      . SERTRALINE HCL 100 MG PO TABS Oral Take 100 mg by mouth daily.      Marland Kitchen SIMVASTATIN 80 MG PO TABS Oral Take 80 mg by mouth at bedtime.      . TERAZOSIN HCL 2 MG PO CAPS Oral Take 2 mg by mouth at bedtime.        BP 124/66  Pulse 88  Temp(Src) 98 F (36.7 C) (Oral)  Resp 22  SpO2 91%  Physical Exam  Nursing note and vitals reviewed. Constitutional: He is cooperative. He has a sickly appearance. He appears ill.  Eyes: Pupils are equal, round, and reactive to light.  Neck: Normal range of motion. Neck supple.  Cardiovascular: Normal rate, regular rhythm, normal heart sounds and intact distal pulses.   Pulmonary/Chest: He has decreased breath sounds. He has wheezes. He has rhonchi.  Abdominal: Soft. Bowel sounds are normal.  Musculoskeletal: He exhibits no edema.  Lymphadenopathy:    He has no cervical adenopathy.  Neurological: He is alert.  Skin: Skin is warm and dry.  Psychiatric: He has a  normal mood and affect.    ED Course  Procedures (including critical care time)  Labs Reviewed - No data to display Dg Chest 2 View  09/25/2011  *RADIOLOGY REPORT*  Clinical Data: Cough and shortness of breath for past 5 days.  CHEST - 2 VIEW  Comparison: Chest x-ray of 05/09/2011.  Findings: Lung volumes are low.  There is mild prominence of the interstitial markings diffusely, which appears to be predominantly reflective of thickening of the bronchi.  There are a couple of ill- defined peripheral nodules, most pronounced left upper lobe, favored to represent small areas of early airspace consolidation. No pleural effusions.  Pulmonary vasculature is within normal limits.  Heart size is mildly enlarged (unchanged).  Mediastinal contours are unremarkable.  IMPRESSION: 1.  Mild diffuse interstitial prominence which appears to predominately reflects some central airway thickening.  In addition,  there are two small ill-defined nodular opacities in the periphery of the left upper lobe.  Overall appearance is suggestive of bronchitis, possibly with developing multifocal bronchopneumonia. 2.  Given the presence of the ill-defined nodules in the left upper lobe, repeat radiographs after the acute illness has resolved is highly recommended to ensure that the nodules in the left upper lobe have also resolved.  Original Report Authenticated By: Florencia Reasons, M.D.     1. Shortness of breath       MDM  X-rays reviewed and report per radiologist.         Linna Hoff, MD 09/25/11 1004

## 2011-09-25 NOTE — ED Provider Notes (Signed)
History    78 year old male with dyspnea. Was seen in urgent care and referred to the emergency room. Patient states that about a month ago he developed a cough. In the past week he has become progressively more dyspneic and the cough is becoming productive. He denies chest pain. No fevers or chills. Patient states that he does have some swelling in his right lower extremity but he attributes this to a fall he had about a week ago. Denies history of blood clot. Patient has a history of obstructive sleep apnea uses CPAP at night for this. Is followed by Dr Craige Cotta. Patient also states a history of a "blocked artery in my heart." Patient had a cardiac catheterization in 2008 by Dr. Eldridge Dace which did not show any significant coronary disease though and I could not locate any interim information to support a diagnosis of CAD. He does have some risk factors including his age, hypertension and diabetes.    CSN: [id number]  Arrival date & time 09/25/11  1028   First MD Initiated Contact with Patient 09/25/11 1044      Chief Complaint  Patient presents with  . Cough  . Leg Swelling  . Shortness of Breath    (Consider location/radiation/quality/duration/timing/severity/associated sxs/prior treatment) HPI  Past Medical History  Diagnosis Date  . Blockage of coronary artery of heart   . Hypertension   . Diabetes mellitus     Past Surgical History  Procedure Date  . Coronary angioplasty with stent placement   . Hemorrhoid surgery     History reviewed. No pertinent family history.  History  Substance Use Topics  . Smoking status: Never Smoker   . Smokeless tobacco: Never Used  . Alcohol Use: No      Review of Systems   Review of symptoms negative unless otherwise noted in HPI.   Allergies  Review of patient's allergies indicates no known allergies.  Home Medications   Current Outpatient Rx  Name Route Sig Dispense Refill  . ASPIRIN EC 81 MG PO TBEC Oral Take 81 mg by mouth  daily.      Marland Kitchen CLONAZEPAM 1 MG PO TABS Oral Take 1 mg by mouth 2 (two) times daily as needed. For anxiety.    . FUROSEMIDE 40 MG PO TABS Oral Take 40 mg by mouth every other day. Usually takes on MWF     . LISINOPRIL 10 MG PO TABS Oral Take 5-10 mg by mouth daily.     Marland Kitchen OMEPRAZOLE 20 MG PO CPDR Oral Take 20 mg by mouth 2 (two) times daily.      Marland Kitchen PRESCRIPTION MEDICATION Topical Apply 1 application topically daily. Clindamycin phosphate 1%    . PRESCRIPTION MEDICATION Topical Apply 1 application topically daily. Clotrimazole cream    . SERTRALINE HCL 100 MG PO TABS Oral Take 100 mg by mouth daily.      Marland Kitchen SIMVASTATIN 40 MG PO TABS Oral Take 40 mg by mouth every evening.    Marland Kitchen TERAZOSIN HCL 1 MG PO CAPS Oral Take 2 mg by mouth at bedtime.    Marland Kitchen NITROGLYCERIN 0.4 MG SL SUBL Sublingual Place 0.4 mg under the tongue every 5 (five) minutes as needed.        BP 126/60  Pulse 87  Temp(Src) 98.7 F (37.1 C) (Oral)  Resp 28  SpO2 94%  Physical Exam  Nursing note and vitals reviewed. Constitutional: He appears well-developed and well-nourished. No distress.       Laying in bed. No acute  distress.  HENT:  Head: Normocephalic and atraumatic.  Eyes: Conjunctivae are normal. Right eye exhibits no discharge. Left eye exhibits no discharge.  Neck: Neck supple.  Cardiovascular: Normal rate, regular rhythm and normal heart sounds.  Exam reveals no gallop and no friction rub.   No murmur heard. Pulmonary/Chest: Effort normal and breath sounds normal. No respiratory distress.       Mild tachypnea, otherwise no respiratory distress. Lungs are clear.  Abdominal: Soft. He exhibits no distension. There is no tenderness.  Musculoskeletal: He exhibits no edema and no tenderness.  Neurological: He is alert.  Skin: Skin is warm and dry.  Psychiatric: He has a normal mood and affect. His behavior is normal. Thought content normal.    ED Course  Procedures (including critical care time)  Labs Reviewed  CBC -  Abnormal; Notable for the following:    RBC 3.66 (*)    Hemoglobin 11.1 (*)    HCT 33.4 (*)    All other components within normal limits  BASIC METABOLIC PANEL - Abnormal; Notable for the following:    Glucose, Bld 131 (*)    Calcium 8.2 (*)    GFR calc non Af Amer 64 (*)    GFR calc Af Amer 75 (*)    All other components within normal limits  TROPONIN I  URINALYSIS, ROUTINE W REFLEX MICROSCOPIC  PRO B NATRIURETIC PEPTIDE   Dg Chest 2 View  09/25/2011  *RADIOLOGY REPORT*  Clinical Data: Cough and shortness of breath for past 5 days.  CHEST - 2 VIEW  Comparison: Chest x-ray of 05/09/2011.  Findings: Lung volumes are low.  There is mild prominence of the interstitial markings diffusely, which appears to be predominantly reflective of thickening of the bronchi.  There are a couple of ill- defined peripheral nodules, most pronounced left upper lobe, favored to represent small areas of early airspace consolidation. No pleural effusions.  Pulmonary vasculature is within normal limits.  Heart size is mildly enlarged (unchanged).  Mediastinal contours are unremarkable.  IMPRESSION: 1.  Mild diffuse interstitial prominence which appears to predominately reflects some central airway thickening.  In addition, there are two small ill-defined nodular opacities in the periphery of the left upper lobe.  Overall appearance is suggestive of bronchitis, possibly with developing multifocal bronchopneumonia. 2.  Given the presence of the ill-defined nodules in the left upper lobe, repeat radiographs after the acute illness has resolved is highly recommended to ensure that the nodules in the left upper lobe have also resolved.  Original Report Authenticated By: Florencia Reasons, M.D.   EKG:  Rhythm: Sinus rhythm Rate: 72 Axis: Normal Intervals: Abnormal R wave progression. ST segments: Nonspecific ST changes inferiorly   1. Shortness of breath   2. CAP (community acquired pneumonia)   3. Hypoxia   4.  UTI    MDM  78 year old male with dyspnea and cough. Chest x-ray with central airway thickening. This may just be a viral bronchitis but patient is hypoxic and has no home O2 requirement. Pt is afebrile and has no leukocytosis but will initiate antibiotics for possible community-acquired pneumonia. Admission. Venous duplex RLE to eval for DVT. Incidental UTI which should be covered by ceftriaxone.        Raeford Razor, MD 09/29/11 2152

## 2011-09-26 LAB — CBC
HCT: 32.4 % — ABNORMAL LOW (ref 39.0–52.0)
Hemoglobin: 10.8 g/dL — ABNORMAL LOW (ref 13.0–17.0)
MCH: 30.7 pg (ref 26.0–34.0)
MCHC: 33.3 g/dL (ref 30.0–36.0)
MCV: 92 fL (ref 78.0–100.0)
Platelets: 194 K/uL (ref 150–400)
RBC: 3.52 MIL/uL — ABNORMAL LOW (ref 4.22–5.81)
RDW: 13.1 % (ref 11.5–15.5)
WBC: 7.3 K/uL (ref 4.0–10.5)

## 2011-09-26 LAB — DIFFERENTIAL
Basophils Absolute: 0 K/uL (ref 0.0–0.1)
Basophils Relative: 0 % (ref 0–1)
Eosinophils Absolute: 0 K/uL (ref 0.0–0.7)
Eosinophils Relative: 0 % (ref 0–5)
Lymphocytes Relative: 9 % — ABNORMAL LOW (ref 12–46)
Lymphs Abs: 0.7 K/uL (ref 0.7–4.0)
Monocytes Absolute: 0.2 K/uL (ref 0.1–1.0)
Monocytes Relative: 3 % (ref 3–12)
Neutro Abs: 6.4 K/uL (ref 1.7–7.7)
Neutrophils Relative %: 87 % — ABNORMAL HIGH (ref 43–77)

## 2011-09-26 LAB — COMPREHENSIVE METABOLIC PANEL
ALT: 36 U/L (ref 0–53)
BUN: 21 mg/dL (ref 6–23)
CO2: 26 mEq/L (ref 19–32)
Calcium: 8.4 mg/dL (ref 8.4–10.5)
Creatinine, Ser: 0.9 mg/dL (ref 0.50–1.35)
GFR calc Af Amer: 89 mL/min — ABNORMAL LOW (ref >90)
GFR calc non Af Amer: 77 mL/min — ABNORMAL LOW (ref >90)
Glucose, Bld: 263 mg/dL — ABNORMAL HIGH (ref 70–99)
Sodium: 139 mEq/L (ref 135–145)
Total Protein: 6.6 g/dL (ref 6.0–8.3)

## 2011-09-26 LAB — TSH: TSH: 1.996 u[IU]/mL (ref 0.350–4.500)

## 2011-09-26 LAB — PROTIME-INR
INR: 1.22 (ref 0.00–1.49)
Prothrombin Time: 15.7 seconds — ABNORMAL HIGH (ref 11.6–15.2)

## 2011-09-26 LAB — GLUCOSE, CAPILLARY: Glucose-Capillary: 314 mg/dL — ABNORMAL HIGH (ref 70–99)

## 2011-09-26 MED ORDER — INSULIN ASPART 100 UNIT/ML ~~LOC~~ SOLN
0.0000 [IU] | Freq: Three times a day (TID) | SUBCUTANEOUS | Status: DC
Start: 2011-09-26 — End: 2011-09-28
  Administered 2011-09-26: 7 [IU] via SUBCUTANEOUS
  Administered 2011-09-26: 18:00:00 via SUBCUTANEOUS
  Administered 2011-09-27 (×2): 2 [IU] via SUBCUTANEOUS
  Administered 2011-09-27: 1 [IU] via SUBCUTANEOUS
  Administered 2011-09-28: 2 [IU] via SUBCUTANEOUS

## 2011-09-26 MED ORDER — POTASSIUM CHLORIDE 20 MEQ/15ML (10%) PO LIQD
40.0000 meq | Freq: Once | ORAL | Status: AC
  Administered 2011-09-26: 40 meq via ORAL
  Filled 2011-09-26: qty 30

## 2011-09-26 MED ORDER — FUROSEMIDE 10 MG/ML IJ SOLN
20.0000 mg | Freq: Once | INTRAMUSCULAR | Status: AC
  Administered 2011-09-26: 20 mg via INTRAVENOUS
  Filled 2011-09-26: qty 2

## 2011-09-26 MED ORDER — BIOTENE DRY MOUTH MT LIQD
15.0000 mL | OROMUCOSAL | Status: DC | PRN
  Filled 2011-09-26: qty 15

## 2011-09-26 MED ORDER — PREDNISONE 50 MG PO TABS
50.0000 mg | ORAL_TABLET | Freq: Every day | ORAL | Status: DC
  Administered 2011-09-26 – 2011-09-27 (×2): 50 mg via ORAL
  Filled 2011-09-26 (×3): qty 1

## 2011-09-26 NOTE — Progress Notes (Addendum)
Feels better today. Reviewed cT chest, no PE, has b/l upper lobe PNA, no influenza, uti.    1. Shortness of breath   2. CAP (community acquired pneumonia)   3. Hypoxia     Past Medical History  Diagnosis Date  . Blockage of coronary artery of heart   . Hypertension   . Acute respiratory failure 09/25/11    hypoxic  . Chronic respiratory failure with hypoxia   . Peripheral vascular disease   . Myocardial infarction ~ 2003  . Angina   . Sleep apnea   . High cholesterol   . Anxiety    Current Facility-Administered Medications  Medication Dose Route Frequency Provider Last Rate Last Dose  . acetaminophen (TYLENOL) tablet 650 mg  650 mg Oral Q6H PRN Shequilla Goodgame, MD       Or  . acetaminophen (TYLENOL) suppository 650 mg  650 mg Rectal Q6H PRN Daran Favaro, MD      . albuterol (PROVENTIL) (5 MG/ML) 0.5% nebulizer solution 2.5 mg  2.5 mg Nebulization BID Provider Default, MD   2.5 mg at 09/26/11 0929  . albuterol (PROVENTIL) (5 MG/ML) 0.5% nebulizer solution 2.5 mg  2.5 mg Nebulization Q6H PRN Provider Default, MD   2.5 mg at 09/25/11 2033  . albuterol (PROVENTIL) (5 MG/ML) 0.5% nebulizer solution 5 mg  5 mg Nebulization Once Raeford Razor, MD   5 mg at 09/25/11 1150  . antiseptic oral rinse (BIOTENE) solution 15 mL  15 mL Mouth Rinse PRN Erminio Nygard, MD      . aspirin EC tablet 81 mg  81 mg Oral Daily Irving Bloor, MD      . azithromycin (ZITHROMAX) 500 mg in dextrose 5 % 250 mL IVPB  500 mg Intravenous Q24H Callaghan Laverdure, MD   500 mg at 09/25/11 1622  . cefTRIAXone (ROCEPHIN) 1 g in dextrose 5 % 50 mL IVPB  1 g Intravenous Once Raeford Razor, MD   1 g at 09/25/11 1341  . cefTRIAXone (ROCEPHIN) 1 g in dextrose 5 % 50 mL IVPB  1 g Intravenous Q24H Trana Ressler, MD      . clonazePAM (KLONOPIN) tablet 1 mg  1 mg Oral BID PRN Morganne Haile, MD      . docusate sodium (COLACE) capsule 100 mg  100 mg Oral BID Jayliah Benett, MD   100 mg at 09/25/11 2252  . enoxaparin (LOVENOX) injection  40 mg  40 mg Subcutaneous QHS Nabria Nevin, MD   40 mg at 09/25/11 2253  . furosemide (LASIX) injection 20 mg  20 mg Intravenous Once Toby Breithaupt, MD      . furosemide (LASIX) tablet 40 mg  40 mg Oral Q M,W,F Kanin Lia, MD      . guaiFENesin (MUCINEX) 12 hr tablet 1,200 mg  1,200 mg Oral BID Maeleigh Buschman, MD   1,200 mg at 09/25/11 2253  . iohexol (OMNIPAQUE) 350 MG/ML injection 100 mL  100 mL Intravenous Once PRN Medication Radiologist, MD   100 mL at 09/25/11 1714  . ipratropium (ATROVENT) nebulizer solution 0.5 mg  0.5 mg Nebulization Once Raeford Razor, MD   0.5 mg at 09/25/11 1150  . morphine 2 MG/ML injection 2 mg  2 mg Intravenous Q4H PRN Arlenis Blaydes, MD      . nitroGLYCERIN (NITROSTAT) SL tablet 0.4 mg  0.4 mg Sublingual Q5 min PRN Austin Herd, MD      . ondansetron (ZOFRAN) tablet 4 mg  4 mg Oral Q6H PRN Conley Canal, MD  Or  . ondansetron (ZOFRAN) injection 4 mg  4 mg Intravenous Q6H PRN Allizon Woznick, MD      . pantoprazole (PROTONIX) EC tablet 40 mg  40 mg Oral Q1200 Ludean Duhart, MD      . potassium chloride 20 MEQ/15ML (10%) liquid 40 mEq  40 mEq Oral Once Scotty Pinder, MD   40 mEq at 09/25/11 1629  . potassium chloride 20 MEQ/15ML (10%) liquid 40 mEq  40 mEq Oral Once Jamis Kryder, MD      . predniSONE (DELTASONE) tablet 50 mg  50 mg Oral Q breakfast Satrina Magallanes, MD      . sertraline (ZOLOFT) tablet 100 mg  100 mg Oral Daily Vergil Burby, MD      . terazosin (HYTRIN) capsule 2 mg  2 mg Oral QHS Sereen Schaff, MD   2 mg at 09/25/11 2253  . DISCONTD: 0.9 %  sodium chloride infusion   Intravenous Continuous Raeford Razor, MD 1,000 mL/hr at 09/25/11 1341 1,000 mL/hr at 09/25/11 1341  . DISCONTD: 0.9 %  sodium chloride infusion   Intravenous Continuous Briana Farner, MD 75 mL/hr at 09/25/11 2223    . DISCONTD: albuterol (PROVENTIL) (5 MG/ML) 0.5% nebulizer solution 2.5 mg  2.5 mg Nebulization Q6H Danaiya Steadman, MD      . DISCONTD: albuterol (PROVENTIL) (5 MG/ML)  0.5% nebulizer solution 2.5 mg  2.5 mg Nebulization Q2H PRN Ernesta Trabert, MD      . DISCONTD: azithromycin (ZITHROMAX) 500 mg in dextrose 5 % 250 mL IVPB  500 mg Intravenous Once Raeford Razor, MD      . DISCONTD: methylPREDNISolone sodium succinate (SOLU-MEDROL) 125 mg/2 mL injection 125 mg  125 mg Intravenous Q6H Barrie Wale, MD   125 mg at 09/26/11 0515  . DISCONTD: ondansetron (ZOFRAN) injection 4 mg  4 mg Intravenous Q8H PRN Raeford Razor, MD      . DISCONTD: oseltamivir (TAMIFLU) capsule 75 mg  75 mg Oral BID Elizabeth Paulsen, MD   75 mg at 09/25/11 1629   No Known Allergies Active Problems:  Acute and chronic respiratory failure with hypoxia  Bronchitis, acute  Leg swelling  Obesity   Vital signs in last 24 hours: Temp:  [97.2 F (36.2 C)-98.5 F (36.9 C)] 97.2 F (36.2 C) (04/27 0918) Pulse Rate:  [61-93] 71  (04/27 0918) Resp:  [17-22] 19  (04/27 0918) BP: (114-134)/(56-71) 134/68 mmHg (04/27 0918) SpO2:  [90 %-97 %] 94 % (04/27 0918) FiO2 (%):  [2 %] 2 % (04/26 1505) Weight:  [117.6 kg (259 lb 4.2 oz)] 117.6 kg (259 lb 4.2 oz) (04/26 2051) Weight change:  Last BM Date: 09/25/11  Intake/Output from previous day: 04/26 0701 - 04/27 0700 In: 570 [P.O.:320; IV Piggyback:250] Out: 200 [Urine:200] Intake/Output this shift: Total I/O In: 360 [P.O.:360] Out: -   Lab Results:  Basename 09/26/11 0500 09/25/11 1120  WBC 7.3 9.5  HGB 10.8* 11.1*  HCT 32.4* 33.4*  PLT 194 191   BMET  Basename 09/26/11 0500 09/25/11 1120  NA 139 136  K 3.9 3.5  CL 104 98  CO2 26 26  GLUCOSE 263* 131*  BUN 21 22  CREATININE 0.90 1.04  CALCIUM 8.4 8.2*    Studies/Results: Dg Chest 2 View  09/25/2011  *RADIOLOGY REPORT*  Clinical Data: Cough and shortness of breath for past 5 days.  CHEST - 2 VIEW  Comparison: Chest x-ray of 05/09/2011.  Findings: Lung volumes are low.  There is mild prominence of the interstitial markings  diffusely, which appears to be predominantly reflective of  thickening of the bronchi.  There are a couple of ill- defined peripheral nodules, most pronounced left upper lobe, favored to represent small areas of early airspace consolidation. No pleural effusions.  Pulmonary vasculature is within normal limits.  Heart size is mildly enlarged (unchanged).  Mediastinal contours are unremarkable.  IMPRESSION: 1.  Mild diffuse interstitial prominence which appears to predominately reflects some central airway thickening.  In addition, there are two small ill-defined nodular opacities in the periphery of the left upper lobe.  Overall appearance is suggestive of bronchitis, possibly with developing multifocal bronchopneumonia. 2.  Given the presence of the ill-defined nodules in the left upper lobe, repeat radiographs after the acute illness has resolved is highly recommended to ensure that the nodules in the left upper lobe have also resolved.  Original Report Authenticated By: Florencia Reasons, M.D.   Dg Knee 1-2 Views Right  09/25/2011  *RADIOLOGY REPORT*  Clinical Data: History of fall 9 days ago complaining of right- sided knee pain.  RIGHT KNEE - 1-2 VIEW  Comparison: No priors.  Findings: Three views of the right knee demonstrate no acute fracture, subluxation, or dislocation.  There is very mild joint space narrowing and osteophyte formation in the medial and patellofemoral compartments, compatible with osteoarthritis.  A small fabella is noted posterior to the joint.  Multiple vascular calcifications are evident. Small enthesophytes are noted off the proximal and distal aspect of the patella.  IMPRESSION: 1.  No radiographic evidence of significant acute traumatic injury to the right knee. 2.  Mild degenerative changes of osteoarthritis, as above. 3.  Atherosclerosis.  Original Report Authenticated By: Florencia Reasons, M.D.   Ct Angio Chest W/cm &/or Wo Cm  09/25/2011  *RADIOLOGY REPORT*  Clinical Data: Cough  CT ANGIOGRAPHY CHEST  Technique:  Multidetector CT  imaging of the chest using the standard protocol during bolus administration of intravenous contrast. Multiplanar reconstructed images including MIPs were obtained and reviewed to evaluate the vascular anatomy.  Contrast: OMNIPAQUE IOHEXOL 350 MG/ML SOLN  Comparison: None.  Findings: Severe respiratory motion artifact limits examination of the peripheral vasculature.  No obvious filling defect in the central pulmonary arterial tree.  Small peripheral emboli may be missed by this examination.  Stable the mild mediastinal adenopathy.  No pericardial effusion. Mediastinal lipomatosis is noted.  No pneumothorax.  No pleural effusion.  Patchy bilateral airspace disease most significantly effecting the right lower lobe.  Coronary artery calcifications affecting all three vessels is noted.  A single gallstone is noted on image 87.  Hiatal hernia.  IMPRESSION: No evidence of acute pulmonary thromboembolism.  Respiratory motion artifact limits the exam.  Patchy bilateral airspace disease.  Small gallstone.  Original Report Authenticated By: Donavan Burnet, M.D.    Medications: I have reviewed the patient's current medications.   Physical exam GENERAL- alert HEAD- normal atraumatic, no neck masses, normal thyroid, no jvd RESPIRATORY- appears well, vitals normal, no respiratory distress, acyanotic, normal RR, ear and throat exam is normal, neck free of mass or lymphadenopathy, chest clear, no wheezing, crepitations, rhonchi, normal symmetric air entry CVS- regular rate and rhythm, S1, S2 normal, no murmur, click, rub or gallop ABDOMEN- abdomen is soft without significant tenderness, masses, organomegaly or guarding NEURO- Grossly normal EXTREMITIES- less swelling right leg.  Plan .Severe multilobar CAP- better. No longer wheezing. Doing well even as oxygen prongs not in nostrils! Plan 3 days of iv zithromax. Continue ceftriaxone. Switch to prednisone.  D/cTamiflu. Marland KitchenUTI- on ceftriaxone. Follow urine  culture. .Leg swelling- ?cellulitis, continue abx. .Obesity- bmi>40  .Htn-hold acei due to acute respiratory status. Controlled on norvasc. Marland KitchenHyperglycemia- likely steroid induced. SSI.  Marland KitchenGerd- ppi  Ambulate in [address].        Seniah Lawrence 09/26/2011 10:53 AM Pager: [phone].

## 2011-09-26 NOTE — Progress Notes (Signed)
Placed patient of CPAP on his home settings of 12 cmH2O, full face mask and heated humidity set at 2.  3 L O2 bleed in.  Patient tolerating CPAP well.

## 2011-09-26 NOTE — Progress Notes (Signed)
[id number] nsg Encouraged patient to ambulate but quite hesitant because he has been falling at home. Pt has been up on the chair for almost  part of the day and wants to go back to bed to rest. Needs 1 assist from chair to bed.

## 2011-09-26 NOTE — Progress Notes (Signed)
Pt refuses cpap for now. Advised if he changes his mind to contact [facility].

## 2011-09-27 LAB — CBC
HCT: 31.3 % — ABNORMAL LOW (ref 39.0–52.0)
Hemoglobin: 10.3 g/dL — ABNORMAL LOW (ref 13.0–17.0)
MCV: 90.7 fL (ref 78.0–100.0)
RBC: 3.45 MIL/uL — ABNORMAL LOW (ref 4.22–5.81)
WBC: 12.2 10*3/uL — ABNORMAL HIGH (ref 4.0–10.5)

## 2011-09-27 LAB — BASIC METABOLIC PANEL
BUN: 26 mg/dL — ABNORMAL HIGH (ref 6–23)
CO2: 28 mEq/L (ref 19–32)
Chloride: 105 mEq/L (ref 96–112)
Creatinine, Ser: 0.97 mg/dL (ref 0.50–1.35)
Glucose, Bld: 168 mg/dL — ABNORMAL HIGH (ref 70–99)
Potassium: 4.1 mEq/L (ref 3.5–5.1)

## 2011-09-27 LAB — GLUCOSE, CAPILLARY
Glucose-Capillary: 149 mg/dL — ABNORMAL HIGH (ref 70–99)
Glucose-Capillary: 188 mg/dL — ABNORMAL HIGH (ref 70–99)
Glucose-Capillary: 192 mg/dL — ABNORMAL HIGH (ref 70–99)

## 2011-09-27 LAB — URINE CULTURE: Culture  Setup Time: 201304272031

## 2011-09-27 MED ORDER — AMOXICILLIN-POT CLAVULANATE 875-125 MG PO TABS
1.0000 | ORAL_TABLET | Freq: Two times a day (BID) | ORAL | Status: DC
  Administered 2011-09-28: 1 via ORAL
  Filled 2011-09-27 (×2): qty 1

## 2011-09-27 MED ORDER — PREDNISONE 20 MG PO TABS
40.0000 mg | ORAL_TABLET | Freq: Every day | ORAL | Status: DC
  Administered 2011-09-28: 40 mg via ORAL
  Filled 2011-09-27 (×2): qty 2

## 2011-09-27 NOTE — Progress Notes (Signed)
Placed patient on CPAP at 12cm with oxygen at 2lpm.  Will continue to monitor patient.

## 2011-09-27 NOTE — Progress Notes (Signed)
SUBJECTIVE Feels better. No complaints.   1. Shortness of breath   2. CAP (community acquired pneumonia)   3. Hypoxia     Past Medical History  Diagnosis Date  . Blockage of coronary artery of heart   . Hypertension   . Acute respiratory failure 09/25/11    hypoxic  . Chronic respiratory failure with hypoxia   . Peripheral vascular disease   . Myocardial infarction ~ 2003  . Angina   . Sleep apnea   . High cholesterol   . Anxiety    Current Facility-Administered Medications  Medication Dose Route Frequency Provider Last Rate Last Dose  . acetaminophen (TYLENOL) tablet 650 mg  650 mg Oral Q6H PRN Aamari Ulibarri, MD       Or  . acetaminophen (TYLENOL) suppository 650 mg  650 mg Rectal Q6H PRN Little Bashore, MD      . albuterol (PROVENTIL) (5 MG/ML) 0.5% nebulizer solution 2.5 mg  2.5 mg Nebulization BID Provider Default, MD   2.5 mg at 09/27/11 0812  . albuterol (PROVENTIL) (5 MG/ML) 0.5% nebulizer solution 2.5 mg  2.5 mg Nebulization Q6H PRN Provider Default, MD   2.5 mg at 09/25/11 2033  . antiseptic oral rinse (BIOTENE) solution 15 mL  15 mL Mouth Rinse PRN Caris Cerveny, MD      . aspirin EC tablet 81 mg  81 mg Oral Daily Royston Bekele, MD   81 mg at 09/27/11 0933  . azithromycin (ZITHROMAX) 500 mg in dextrose 5 % 250 mL IVPB  500 mg Intravenous Q24H Alisia Vanengen, MD   500 mg at 09/26/11 1730  . cefTRIAXone (ROCEPHIN) 1 g in dextrose 5 % 50 mL IVPB  1 g Intravenous Q24H Juvon Teater, MD   1 g at 09/26/11 1336  . clonazePAM (KLONOPIN) tablet 1 mg  1 mg Oral BID PRN Conley Canal, MD   1 mg at 09/26/11 2155  . docusate sodium (COLACE) capsule 100 mg  100 mg Oral BID Tarita Deshmukh, MD   100 mg at 09/27/11 0932  . enoxaparin (LOVENOX) injection 40 mg  40 mg Subcutaneous QHS Mitchell Iwanicki, MD   40 mg at 09/26/11 2156  . furosemide (LASIX) injection 20 mg  20 mg Intravenous Once Jarelyn Bambach, MD   20 mg at 09/26/11 1338  . furosemide (LASIX) tablet 40 mg  40 mg Oral Q M,W,F Jorryn Casagrande, MD      . guaiFENesin (MUCINEX) 12 hr tablet 1,200 mg  1,200 mg Oral BID Danissa Rundle, MD   1,200 mg at 09/27/11 0933  . insulin aspart (novoLOG) injection 0-9 Units  0-9 Units Subcutaneous TID WC Veta Dambrosia, MD   1 Units at 09/27/11 0818  . morphine 2 MG/ML injection 2 mg  2 mg Intravenous Q4H PRN Nelida Mandarino, MD      . nitroGLYCERIN (NITROSTAT) SL tablet 0.4 mg  0.4 mg Sublingual Q5 min PRN Alayne Estrella, MD      . ondansetron (ZOFRAN) tablet 4 mg  4 mg Oral Q6H PRN Zelma Snead, MD       Or  . ondansetron (ZOFRAN) injection 4 mg  4 mg Intravenous Q6H PRN Geraldean Walen, MD      . pantoprazole (PROTONIX) EC tablet 40 mg  40 mg Oral Q1200 Clayden Withem, MD   40 mg at 09/26/11 1148  . potassium chloride 20 MEQ/15ML (10%) liquid 40 mEq  40 mEq Oral Once Trinnity Breunig, MD   40 mEq at 09/26/11 1336  . predniSONE (  DELTASONE) tablet 40 mg  40 mg Oral Q breakfast Zyhir Cappella, MD      . sertraline (ZOLOFT) tablet 100 mg  100 mg Oral Daily Hope Brandenburger, MD   100 mg at 09/27/11 0933  . terazosin (HYTRIN) capsule 2 mg  2 mg Oral QHS Jakorey Mcconathy, MD   2 mg at 09/26/11 2156  . DISCONTD: predniSONE (DELTASONE) tablet 50 mg  50 mg Oral Q breakfast Jurnie Garritano, MD   50 mg at 09/27/11 0818   No Known Allergies Active Problems:  Acute and chronic respiratory failure with hypoxia  Pneumonia  Leg swelling  Obesity   Vital signs in last 24 hours: Temp:  [97.5 F (36.4 C)-98 F (36.7 C)] 97.7 F (36.5 C) (04/28 1100) Pulse Rate:  [64-106] 82  (04/28 1100) Resp:  [16-18] 17  (04/28 1100) BP: (102-151)/(49-64) 102/62 mmHg (04/28 1100) SpO2:  [88 %-95 %] 93 % (04/28 1100) Weight:  [118.4 kg (261 lb 0.4 oz)] 118.4 kg (261 lb 0.4 oz) (04/27 2139) Weight change: 0.8 kg (1 lb 12.2 oz) Last BM Date: 09/26/11  Intake/Output from previous day: 04/27 0701 - 04/28 0700 In: 1550 [P.O.:1200; IV Piggyback:350] Out: 1400 [Urine:1400] Intake/Output this shift:    Lab Results:  Basename  09/27/11 0640 09/26/11 0500  WBC 12.2* 7.3  HGB 10.3* 10.8*  HCT 31.3* 32.4*  PLT 230 194   BMET  Basename 09/27/11 0640 09/26/11 0500  NA 141 139  K 4.1 3.9  CL 105 104  CO2 28 26  GLUCOSE 168* 263*  BUN 26* 21  CREATININE 0.97 0.90  CALCIUM 8.3* 8.4    Studies/Results: Dg Knee 1-2 Views Right  09/25/2011  *RADIOLOGY REPORT*  Clinical Data: History of fall 9 days ago complaining of right- sided knee pain.  RIGHT KNEE - 1-2 VIEW  Comparison: No priors.  Findings: Three views of the right knee demonstrate no acute fracture, subluxation, or dislocation.  There is very mild joint space narrowing and osteophyte formation in the medial and patellofemoral compartments, compatible with osteoarthritis.  A small fabella is noted posterior to the joint.  Multiple vascular calcifications are evident. Small enthesophytes are noted off the proximal and distal aspect of the patella.  IMPRESSION: 1.  No radiographic evidence of significant acute traumatic injury to the right knee. 2.  Mild degenerative changes of osteoarthritis, as above. 3.  Atherosclerosis.  Original Report Authenticated By: Florencia Reasons, M.D.   Ct Angio Chest W/cm &/or Wo Cm  09/25/2011  *RADIOLOGY REPORT*  Clinical Data: Cough  CT ANGIOGRAPHY CHEST  Technique:  Multidetector CT imaging of the chest using the standard protocol during bolus administration of intravenous contrast. Multiplanar reconstructed images including MIPs were obtained and reviewed to evaluate the vascular anatomy.  Contrast: OMNIPAQUE IOHEXOL 350 MG/ML SOLN  Comparison: None.  Findings: Severe respiratory motion artifact limits examination of the peripheral vasculature.  No obvious filling defect in the central pulmonary arterial tree.  Small peripheral emboli may be missed by this examination.  Stable the mild mediastinal adenopathy.  No pericardial effusion. Mediastinal lipomatosis is noted.  No pneumothorax.  No pleural effusion.  Patchy bilateral  airspace disease most significantly effecting the right lower lobe.  Coronary artery calcifications affecting all three vessels is noted.  A single gallstone is noted on image 87.  Hiatal hernia.  IMPRESSION: No evidence of acute pulmonary thromboembolism.  Respiratory motion artifact limits the exam.  Patchy bilateral airspace disease.  Small gallstone.  Original Report Authenticated  By: Donavan Burnet, M.D.    Medications: I have reviewed the patient's current medications.   Physical exam GENERAL- alert HEAD- normal atraumatic, no neck masses, normal thyroid, no jvd RESPIRATORY- appears well, vitals normal, no respiratory distress, acyanotic, normal RR, ear and throat exam is normal, neck free of mass or lymphadenopathy, chest clear, no wheezing, crepitations, rhonchi, normal symmetric air entry CVS- regular rate and rhythm, S1, S2 normal, no murmur, click, rub or gallop ABDOMEN- abdomen is soft without significant tenderness, masses, organomegaly or guarding NEURO- Grossly normal EXTREMITIES- extremities normal, atraumatic, no cyanosis or edema  Plan .Severe multilobar CAP- better. Switch to augmentin in am. Continue steroid taper. Marland KitchenUTI- on ceftriaxone. Follow urine culture. .Leg swelling- better. .Obesity- bmi>40  .Htn-hold acei due to acute respiratory status. Controlled on norvasc.  Marland KitchenHyperglycemia- likely steroid induced. SSI.  Marland KitchenGerd- ppi  Ambulate in [address].      Elior Robinette 09/27/2011 12:51 PM Pager: [phone].

## 2011-09-27 NOTE — Evaluation (Signed)
Physical Therapy Evaluation Patient Details Name: Bryan Hatfield MRN: [id number] DOB: 11/04/1926 Today's Date: 09/27/2011 Time: 4098-1191 PT Time Calculation (min): 45 min  PT Assessment / Plan / Recommendation Clinical Impression  85 year admitted with SOB s/p fall after hooking foot on object. Found to be hypoxic. Pt presents to physical therapy with decreased endurance, decreased functional mobility, and imbalance with gait. Recommend pt utilize RW with all ambulation at this point. Discussed D/C. Pt aware he is at high risk of falls however feels he will be safe to walk into apartment with son then work with HHPT, pt reports he will consider [facility] for rehab. PT recommending HHPT vs. SNF pending pt preference. Pt reports wife able to proved min assist at home, son available as needed.    PT Assessment  Patient needs continued PT services    Follow Up Recommendations  Home health PT;Skilled nursing facility;Supervision/Assistance - 24 hour    Equipment Recommendations  Rolling walker with 5" wheels;Tub/shower bench    Frequency Min 3X/week    Precautions / Restrictions Precautions Precautions: Fall Precaution Comments: h/o falls at home, (once when his foot got caught on object)   Pertinent Vitals/Pain No c/o pain      Mobility  Bed Mobility Bed Mobility: Rolling Left;Left Sidelying to Sit Rolling Left: 5: Supervision Left Sidelying to Sit: 5: Supervision;HOB flat Details for Bed Mobility Assistance: Slightly difficult for pt given deconditioning, no physical assist needed Transfers Transfers: Sit to Stand;Stand to Sit Sit to Stand: 4: Min guard Stand to Sit: 4: Min guard Details for Transfer Assistance: Min-guard for safety. Verbal cues for safe placement of UEs.  Ambulation/Gait Ambulation/Gait Assistance: 4: Min assist Ambulation Distance (Feet): 35 Feet Assistive device: Rolling walker Ambulation/Gait Assistance Details: up to min assist for slight imbalance, overall  mostly min-guard assist. Pt with very slow cautious gait however with no overt losses of balance. Endurance very low Gait Pattern: Step-through pattern;Decreased stride length;Decreased hip/knee flexion - right;Decreased hip/knee flexion - left (minimal foot clearance) Stairs: No        PT Goals Acute Rehab PT Goals PT Goal Formulation: With patient Time For Goal Achievement: 10/11/11 Potential to Achieve Goals: Good Pt will Roll Supine to Right Side: with modified independence PT Goal: Rolling Supine to Right Side - Progress: Goal set today Pt will Roll Supine to Left Side: with modified independence PT Goal: Rolling Supine to Left Side - Progress: Goal set today Pt will go Supine/Side to Sit: with modified independence PT Goal: Supine/Side to Sit - Progress: Goal set today Pt will go Sit to Supine/Side: with modified independence PT Goal: Sit to Supine/Side - Progress: Goal set today Pt will go Sit to Stand: with supervision PT Goal: Sit to Stand - Progress: Goal set today Pt will go Stand to Sit: with supervision PT Goal: Stand to Sit - Progress: Goal set today Pt will Ambulate: 51 - 150 feet;with supervision;with rolling walker PT Goal: Ambulate - Progress: Goal set today  Visit Information  Last PT Received On: 09/27/11    Subjective Data  Subjective: I'm ready to try to walk   Prior Functioning  Home Living Lives With: Spouse Available Help at Discharge: Available 24 hours/day Type of Home: Apartment Home Access: Elevator Home Layout: Other (Comment) (3rd floor) Bathroom Shower/Tub: Engineer, manufacturing systems: Standard Home Adaptive Equipment: Dan Humphreys - four wheeled;Straight cane Prior Function Level of Independence: Independent Driving: No Vocation: Retired Comments: Worked at [employer] and [employer].  Communication Communication: No difficulties Dominant  Hand: Left    Cognition  Overall Cognitive Status: Appears within functional limits for tasks  assessed/performed Arousal/Alertness: Awake/alert Orientation Level: Appears intact for tasks assessed Behavior During Session: [facility] for tasks performed    Extremity/Trunk Assessment Right Lower Extremity Assessment RLE ROM/Strength/Tone: Deficits RLE ROM/Strength/Tone Deficits: Generalized deconditioning, grossly >/= 3+/5 RLE Sensation: WFL - Light Touch Left Lower Extremity Assessment LLE ROM/Strength/Tone: Deficits LLE ROM/Strength/Tone Deficits: Generalized deconditioning, grossly >/= 3+/5 LLE Sensation: WFL - Light Touch   Balance Balance Balance Assessed: Yes Static Sitting Balance Static Sitting - Balance Support: No upper extremity supported Static Sitting - Level of Assistance: 6: Modified independent (Device/Increase time) Static Standing Balance Static Standing - Balance Support: During functional activity Static Standing - Level of Assistance: 5: Stand by assistance  End of Session PT - End of Session Equipment Utilized During Treatment: Gait belt;Oxygen Activity Tolerance: Patient tolerated treatment well;Patient limited by fatigue Patient left: in chair;with call bell/phone within reach Nurse Communication: Mobility status   Wilhemina Bonito 09/27/2011, 11:56 AM  Sherie Don) Carleene Mains PT, DPT Acute Rehabilitation [phone]

## 2011-09-28 LAB — GLUCOSE, CAPILLARY: Glucose-Capillary: 189 mg/dL — ABNORMAL HIGH (ref 70–99)

## 2011-09-28 MED ORDER — PREDNISONE 10 MG PO TABS: ORAL_TABLET | ORAL | Status: DC

## 2011-09-28 MED ORDER — LEVOFLOXACIN 500 MG PO TABS: 500.0000 mg | ORAL_TABLET | Freq: Every day | ORAL | Status: AC

## 2011-09-28 MED ORDER — AMOXICILLIN-POT CLAVULANATE 875-125 MG PO TABS: 1.0000 | ORAL_TABLET | Freq: Two times a day (BID) | ORAL | Status: AC

## 2011-09-28 MED ORDER — GUAIFENESIN ER 600 MG PO TB12: 1200.0000 mg | ORAL_TABLET | Freq: Two times a day (BID) | ORAL | Status: DC

## 2011-09-28 NOTE — Progress Notes (Signed)
09/28/2011 86% ra. Gloriajean Dell RN

## 2011-09-28 NOTE — Progress Notes (Signed)
Inpatient Diabetes Program Recommendations  AACE/ADA: New Consensus Statement on Inpatient Glycemic Control (2009)  Target Ranges:  Prepandial:   less than 140 mg/dL      Peak postprandial:   less than 180 mg/dL (1-2 hours)      Critically ill patients:  140 - 180 mg/dL    Results for EUELL, SCHIFF (MRN [id number]) as of 09/28/2011 10:32  Ref. Range 09/27/2011 08:06 09/27/2011 12:19 09/27/2011 17:04 09/27/2011 21:54  Glucose-Capillary Latest Range: 70-99 mg/dL 409 (H) 811 (H) 914 (H) 297 (H)   Patient on PO Prednisone.  Inpatient Diabetes Program Recommendations Correction (SSI): Please increase SSI to Moderate scale tid ac + HS scale coverage.  Note: Will follow. Ambrose Finland RN, MSN, CDE Diabetes Coordinator Inpatient Diabetes Program [phone]

## 2011-09-28 NOTE — Progress Notes (Signed)
Physical Therapy Treatment Patient Details Name: KEYAAN LEDERMAN MRN: [id number] DOB: 05/18/1927 Today's Date: 09/28/2011 Time: 1540-1600 PT Time Calculation (min): 20 min  PT Assessment / Plan / Recommendation Comments on Treatment Session  Good improvements with activity tolerance, and good work towards independence with mobility; Possible dc home today; Agree with HHPT follow-up    Follow Up Recommendations  Home health PT;Supervision - Intermittent    Equipment Recommendations  Rolling walker with 5" wheels;Tub/shower bench    Frequency Min 3X/week   Plan Discharge plan remains appropriate    Precautions / Restrictions Precautions Precautions: Fall Precaution Comments: h/o falls at home, (once when his foot got caught on object) Restrictions Weight Bearing Restrictions: No   Pertinent Vitals/Pain Session conducted on room Air; Pt ambulated on Room and Air and O2 sats decreased to 86%-87%; Cued pt to perform deep breathing during amb; DOE 2/4    Mobility  Transfers Transfers: Sit to Stand;Stand to Sit Sit to Stand: 4: Min guard Stand to Sit: 4: Min guard Details for Transfer Assistance: Cues for safe hand placement Ambulation/Gait Ambulation/Gait Assistance: 4: Min guard Ambulation Distance (Feet): 50 Feet Assistive device: Rolling walker Ambulation/Gait Assistance Details: cues for posture; cues for controlled breathing Gait Pattern: Step-through pattern;Decreased stride length;Decreased hip/knee flexion - right;Decreased hip/knee flexion - left    Exercises     PT Goals Acute Rehab PT Goals Time For Goal Achievement: 10/11/11 Potential to Achieve Goals: Good Pt will go Sit to Stand: with supervision PT Goal: Sit to Stand - Progress: Progressing toward goal Pt will go Stand to Sit: with supervision PT Goal: Stand to Sit - Progress: Progressing toward goal Pt will Ambulate: 51 - 150 feet;with supervision;with rolling walker PT Goal: Ambulate - Progress: Progressing  toward goal  Visit Information  Last PT Received On: 09/28/11 Assistance Needed: +1    Subjective Data  Subjective: I'm ready to go home   Cognition  Overall Cognitive Status: Appears within functional limits for tasks assessed/performed Arousal/Alertness: Awake/alert Orientation Level: Appears intact for tasks assessed Behavior During Session: [facility] for tasks performed    Balance     End of Session PT - End of Session Equipment Utilized During Treatment: Gait belt Activity Tolerance: Patient tolerated treatment well Patient left: in chair;with call bell/phone within reach;with family/visitor present Nurse Communication: Mobility status    Olen Pel [facility],  [phone]  09/28/2011, 4:28 PM

## 2011-09-28 NOTE — Progress Notes (Signed)
Utilization review completed. Garey Alleva RN  

## 2011-09-28 NOTE — Discharge Summary (Signed)
DISCHARGE SUMMARY  Bryan Hatfield  MR#: [id number]  DOB:Oct 01, 1926  Date of Admission: 09/25/2011 Date of Discharge: 09/28/2011  Attending Physician:Bryan Hatfield  Patient's NWG:Bryan Hatfield  Consults: none.  Discharge Diagnoses: Present on Admission:  .Acute and chronic respiratory failure with hypoxia .Pneumonia .Leg swelling .Obesity  Hospital Course: [name] is a pleasant 78 year old male admitted with SOB/cough, leg swelling after recent travel to [place]. He was hypoxic at presentation, raising concern for dvt/pe, both of which were non existent by imaging. Ct chest showed patchy bilateral airspace disease. Flu swab was negative. He was placed on ceftriaxone/zithromax, and will d/c on augmentin/levaquin. He required systemic steroids due to persistent wheezing. He has done well clinically, and will have HHS to help him get to his usual state.   Medication List  As of 09/28/2011  1:37 PM   TAKE these medications         amoxicillin-clavulanate 875-125 MG per tablet   Commonly known as: AUGMENTIN   Take 1 tablet by mouth every 12 (twelve) hours.      aspirin EC 81 MG tablet   Take 81 mg by mouth daily.      clonazePAM 1 MG tablet   Commonly known as: KLONOPIN   Take 1 mg by mouth 2 (two) times daily as needed. For anxiety.      furosemide 40 MG tablet   Commonly known as: LASIX   Take 40 mg by mouth every other day. Usually takes on MWF      guaiFENesin 600 MG 12 hr tablet   Commonly known as: MUCINEX   Take 2 tablets (1,200 mg total) by mouth 2 (two) times daily.      levofloxacin 500 MG tablet   Commonly known as: LEVAQUIN   Take 1 tablet (500 mg total) by mouth daily.      lisinopril 10 MG tablet   Commonly known as: PRINIVIL,ZESTRIL   Take 5-10 mg by mouth daily.      nitroGLYCERIN 0.4 MG SL tablet   Commonly known as: NITROSTAT   Place 0.4 mg under the tongue every 5 (five) minutes as needed.      omeprazole 20 MG capsule   Commonly known as:  PRILOSEC   Take 20 mg by mouth 2 (two) times daily.      predniSONE 10 MG tablet   Commonly known as: DELTASONE   Take 4 tablets daily for 2 days, 3 tablets daily for 2 days, then 2 tablets daily for 2 days, then 1 tablet daily for 2 days      PRESCRIPTION MEDICATION   Apply 1 application topically daily. Clindamycin phosphate 1%      PRESCRIPTION MEDICATION   Apply 1 application topically daily. Clotrimazole cream      sertraline 100 MG tablet   Commonly known as: ZOLOFT   Take 100 mg by mouth daily.      simvastatin 40 MG tablet   Commonly known as: ZOCOR   Take 40 mg by mouth every evening.      terazosin 1 MG capsule   Commonly known as: HYTRIN   Take 2 mg by mouth at bedtime.             Day of Discharge BP 130/71  Pulse 77  Temp(Src) 98 F (36.7 C) (Oral)  Resp 18  Ht 5\' 11"  (1.803 m)  Wt 120 kg (264 lb 8.8 oz)  BMI 36.90 kg/m2  SpO2 90%  Physical Exam: Comfortable at rest.  Results for orders placed during the hospital encounter of 09/25/11 (from the past 24 hour(s))  GLUCOSE, CAPILLARY     Status: Abnormal   Collection Time   09/27/11  5:04 PM      Component Value Range   Glucose-Capillary 192 (*) 70 - 99 (mg/dL)  GLUCOSE, CAPILLARY     Status: Abnormal   Collection Time   09/27/11  9:54 PM      Component Value Range   Glucose-Capillary 297 (*) 70 - 99 (mg/dL)   Comment 1 Documented in Chart     Comment 2 Notify RN    GLUCOSE, CAPILLARY     Status: Abnormal   Collection Time   09/28/11  9:42 AM      Component Value Range   Glucose-Capillary 134 (*) 70 - 99 (mg/dL)  GLUCOSE, CAPILLARY     Status: Abnormal   Collection Time   09/28/11 11:22 AM      Component Value Range   Glucose-Capillary 161 (*) 70 - 99 (mg/dL)    Disposition: home today.   Follow-up Appts: Discharge Orders    Future Orders Please Complete By Expires   Diet - low sodium heart healthy      Increase activity slowly           Time spent in discharge (includes  decision making & examination of pt): 20 minutes  Signed: Mitsue Hatfield 09/28/2011, 1:37 PM

## 2012-05-08 ENCOUNTER — Emergency Department (HOSPITAL_COMMUNITY): Payer: Medicare Other

## 2012-05-08 ENCOUNTER — Encounter (HOSPITAL_COMMUNITY): Payer: Self-pay | Admitting: Physical Medicine and Rehabilitation

## 2012-05-08 ENCOUNTER — Observation Stay (HOSPITAL_COMMUNITY)
Admission: EM | Admit: 2012-05-08 | Discharge: 2012-05-09 | Disposition: A | Discharge status: Home / Self Care | Payer: Medicare Other | Source: Ambulatory Visit | Attending: Interventional Cardiology | Admitting: Interventional Cardiology

## 2012-05-08 DIAGNOSIS — I2510 Atherosclerotic heart disease of native coronary artery without angina pectoris: Principal | ICD-10-CM | POA: Insufficient documentation

## 2012-05-08 DIAGNOSIS — K219 Gastro-esophageal reflux disease without esophagitis: Secondary | ICD-10-CM | POA: Insufficient documentation

## 2012-05-08 DIAGNOSIS — R0609 Other forms of dyspnea: Secondary | ICD-10-CM

## 2012-05-08 DIAGNOSIS — R0989 Other specified symptoms and signs involving the circulatory and respiratory systems: Secondary | ICD-10-CM

## 2012-05-08 DIAGNOSIS — I25119 Atherosclerotic heart disease of native coronary artery with unspecified angina pectoris: Secondary | ICD-10-CM | POA: Diagnosis present

## 2012-05-08 DIAGNOSIS — R269 Unspecified abnormalities of gait and mobility: Secondary | ICD-10-CM | POA: Insufficient documentation

## 2012-05-08 DIAGNOSIS — D649 Anemia, unspecified: Secondary | ICD-10-CM | POA: Insufficient documentation

## 2012-05-08 DIAGNOSIS — Z79899 Other long term (current) drug therapy: Secondary | ICD-10-CM | POA: Insufficient documentation

## 2012-05-08 DIAGNOSIS — E669 Obesity, unspecified: Secondary | ICD-10-CM | POA: Insufficient documentation

## 2012-05-08 DIAGNOSIS — R110 Nausea: Secondary | ICD-10-CM | POA: Insufficient documentation

## 2012-05-08 DIAGNOSIS — G4733 Obstructive sleep apnea (adult) (pediatric): Secondary | ICD-10-CM | POA: Insufficient documentation

## 2012-05-08 DIAGNOSIS — I10 Essential (primary) hypertension: Secondary | ICD-10-CM | POA: Insufficient documentation

## 2012-05-08 DIAGNOSIS — R7309 Other abnormal glucose: Secondary | ICD-10-CM | POA: Insufficient documentation

## 2012-05-08 DIAGNOSIS — R61 Generalized hyperhidrosis: Secondary | ICD-10-CM | POA: Insufficient documentation

## 2012-05-08 DIAGNOSIS — R0600 Dyspnea, unspecified: Secondary | ICD-10-CM

## 2012-05-08 DIAGNOSIS — R0602 Shortness of breath: Secondary | ICD-10-CM | POA: Insufficient documentation

## 2012-05-08 DIAGNOSIS — Z9861 Coronary angioplasty status: Secondary | ICD-10-CM | POA: Insufficient documentation

## 2012-05-08 DIAGNOSIS — I200 Unstable angina: Secondary | ICD-10-CM | POA: Diagnosis present

## 2012-05-08 DIAGNOSIS — J9610 Chronic respiratory failure, unspecified whether with hypoxia or hypercapnia: Secondary | ICD-10-CM | POA: Insufficient documentation

## 2012-05-08 DIAGNOSIS — R1013 Epigastric pain: Secondary | ICD-10-CM | POA: Insufficient documentation

## 2012-05-08 HISTORY — DX: Polyp of colon: K63.5

## 2012-05-08 HISTORY — DX: Gastro-esophageal reflux disease without esophagitis: K21.9

## 2012-05-08 HISTORY — DX: Pleural effusion, not elsewhere classified: J90

## 2012-05-08 HISTORY — DX: Atherosclerotic heart disease of native coronary artery without angina pectoris: I25.10

## 2012-05-08 LAB — COMPREHENSIVE METABOLIC PANEL
ALT: 20 U/L (ref 0–53)
AST: 24 U/L (ref 0–37)
Albumin: 3.5 g/dL (ref 3.5–5.2)
Alkaline Phosphatase: 71 U/L (ref 39–117)
CO2: 26 mEq/L (ref 19–32)
Chloride: 103 mEq/L (ref 96–112)
Creatinine, Ser: 0.99 mg/dL (ref 0.50–1.35)
GFR calc non Af Amer: 74 mL/min — ABNORMAL LOW (ref >90)
Potassium: 4.3 mEq/L (ref 3.5–5.1)
Sodium: 139 mEq/L (ref 135–145)
Total Bilirubin: 0.3 mg/dL (ref 0.3–1.2)

## 2012-05-08 LAB — CBC WITH DIFFERENTIAL/PLATELET
Basophils Absolute: 0 10*3/uL (ref 0.0–0.1)
Eosinophils Relative: 3 % (ref 0–5)
HCT: 38.5 % — ABNORMAL LOW (ref 39.0–52.0)
Lymphocytes Relative: 17 % (ref 12–46)
Lymphs Abs: 1.2 10*3/uL (ref 0.7–4.0)
MCV: 92.3 fL (ref 78.0–100.0)
Neutro Abs: 5.3 10*3/uL (ref 1.7–7.7)
Platelets: 210 10*3/uL (ref 150–400)
RBC: 4.17 MIL/uL — ABNORMAL LOW (ref 4.22–5.81)
RDW: 13.4 % (ref 11.5–15.5)
WBC: 7.1 10*3/uL (ref 4.0–10.5)

## 2012-05-08 LAB — URINALYSIS, ROUTINE W REFLEX MICROSCOPIC
Glucose, UA: NEGATIVE mg/dL
Ketones, ur: NEGATIVE mg/dL
Leukocytes, UA: NEGATIVE
Nitrite: NEGATIVE
Specific Gravity, Urine: 1.01 (ref 1.005–1.030)
pH: 6.5 (ref 5.0–8.0)

## 2012-05-08 LAB — PROTIME-INR: INR: 1 (ref 0.00–1.49)

## 2012-05-08 LAB — POCT I-STAT TROPONIN I: Troponin i, poc: 0 ng/mL (ref 0.00–0.08)

## 2012-05-08 LAB — MRSA PCR SCREENING: MRSA by PCR: NEGATIVE

## 2012-05-08 LAB — TROPONIN I: Troponin I: <0.3 ng/mL (ref <0.30)

## 2012-05-08 MED ORDER — WITCH HAZEL-GLYCERIN EX PADS
MEDICATED_PAD | CUTANEOUS | Status: DC | PRN
  Filled 2012-05-08: qty 100

## 2012-05-08 MED ORDER — SERTRALINE HCL 100 MG PO TABS
100.0000 mg | ORAL_TABLET | Freq: Every day | ORAL | Status: DC
  Administered 2012-05-09: 100 mg via ORAL
  Filled 2012-05-08: qty 1

## 2012-05-08 MED ORDER — FUROSEMIDE 40 MG PO TABS
40.0000 mg | ORAL_TABLET | ORAL | Status: DC
  Administered 2012-05-09: 40 mg via ORAL
  Filled 2012-05-08: qty 1

## 2012-05-08 MED ORDER — SODIUM CHLORIDE 0.9 % IJ SOLN: 3.0000 mL | INTRAMUSCULAR | Status: DC | PRN

## 2012-05-08 MED ORDER — GUAIFENESIN ER 600 MG PO TB12
1200.0000 mg | ORAL_TABLET | Freq: Two times a day (BID) | ORAL | Status: DC | PRN
  Filled 2012-05-08: qty 2

## 2012-05-08 MED ORDER — ASPIRIN 81 MG PO CHEW
162.0000 mg | CHEWABLE_TABLET | Freq: Once | ORAL | Status: AC
  Administered 2012-05-08: 162 mg via ORAL
  Filled 2012-05-08: qty 2

## 2012-05-08 MED ORDER — ASPIRIN EC 81 MG PO TBEC
81.0000 mg | DELAYED_RELEASE_TABLET | Freq: Every day | ORAL | Status: DC
  Administered 2012-05-09: 81 mg via ORAL
  Filled 2012-05-08: qty 1

## 2012-05-08 MED ORDER — LISINOPRIL 5 MG PO TABS
5.0000 mg | ORAL_TABLET | Freq: Every day | ORAL | Status: DC
  Administered 2012-05-09: 5 mg via ORAL
  Filled 2012-05-08: qty 1

## 2012-05-08 MED ORDER — TRAZODONE 25 MG HALF TABLET
25.0000 mg | ORAL_TABLET | Freq: Every day | ORAL | Status: DC
  Administered 2012-05-08: 25 mg via ORAL
  Filled 2012-05-08 (×2): qty 1

## 2012-05-08 MED ORDER — PANTOPRAZOLE SODIUM 40 MG PO TBEC
40.0000 mg | DELAYED_RELEASE_TABLET | Freq: Two times a day (BID) | ORAL | Status: DC
  Administered 2012-05-08 – 2012-05-09 (×3): 40 mg via ORAL
  Filled 2012-05-08 (×3): qty 1

## 2012-05-08 MED ORDER — TERAZOSIN HCL 2 MG PO CAPS
2.0000 mg | ORAL_CAPSULE | Freq: Every day | ORAL | Status: DC
  Administered 2012-05-08: 2 mg via ORAL
  Filled 2012-05-08 (×2): qty 1

## 2012-05-08 MED ORDER — METOPROLOL TARTRATE 12.5 MG HALF TABLET
12.5000 mg | ORAL_TABLET | Freq: Two times a day (BID) | ORAL | Status: DC
  Administered 2012-05-08 – 2012-05-09 (×2): 12.5 mg via ORAL
  Filled 2012-05-08 (×4): qty 1

## 2012-05-08 MED ORDER — SODIUM CHLORIDE 0.9 % IJ SOLN
3.0000 mL | Freq: Two times a day (BID) | INTRAMUSCULAR | Status: DC
  Administered 2012-05-08: 3 mL via INTRAVENOUS

## 2012-05-08 MED ORDER — SODIUM CHLORIDE 0.9 % IV SOLN
20.0000 mL | INTRAVENOUS | Status: DC
  Administered 2012-05-08: 15:00:00 via INTRAVENOUS

## 2012-05-08 MED ORDER — NITROGLYCERIN 0.4 MG SL SUBL: 0.4000 mg | SUBLINGUAL_TABLET | SUBLINGUAL | Status: DC | PRN

## 2012-05-08 MED ORDER — ATORVASTATIN CALCIUM 80 MG PO TABS
80.0000 mg | ORAL_TABLET | Freq: Every day | ORAL | Status: DC
  Administered 2012-05-08 – 2012-05-09 (×2): 80 mg via ORAL
  Filled 2012-05-08 (×2): qty 1

## 2012-05-08 MED ORDER — SODIUM CHLORIDE 0.9 % IV SOLN
250.0000 mL | INTRAVENOUS | Status: DC | PRN
  Administered 2012-05-08: 250 mL via INTRAVENOUS

## 2012-05-08 MED ORDER — ONDANSETRON HCL 4 MG/2ML IJ SOLN: 4.0000 mg | Freq: Four times a day (QID) | INTRAMUSCULAR | Status: DC | PRN

## 2012-05-08 MED ORDER — ACETAMINOPHEN 325 MG PO TABS: 650.0000 mg | ORAL_TABLET | ORAL | Status: DC | PRN

## 2012-05-08 MED ORDER — HEPARIN BOLUS VIA INFUSION
4000.0000 [IU] | Freq: Once | INTRAVENOUS | Status: AC
  Administered 2012-05-08: 4000 [IU] via INTRAVENOUS
  Filled 2012-05-08: qty 4000

## 2012-05-08 MED ORDER — HEPARIN (PORCINE) IN NACL 100-0.45 UNIT/ML-% IJ SOLN
1550.0000 [IU]/h | INTRAMUSCULAR | Status: DC
  Administered 2012-05-08: 1650 [IU]/h via INTRAVENOUS
  Administered 2012-05-09: 1550 [IU]/h via INTRAVENOUS
  Filled 2012-05-08 (×3): qty 250

## 2012-05-08 NOTE — ED Notes (Signed)
Evening meal finished, VSS, preparing to transport to 3W, wife up in w/c, denies pain, "feels hot, over did it eating", cool w/c to forehead. NSR on monitor.  Up with tele tech on monitor. Next troponin due at 2020, receiving RN aware.

## 2012-05-08 NOTE — ED Notes (Signed)
Pt presents to department for evaluation of generalized weakness. States he broke into a heavy sweat this morning while walking outside to get trash cans. States he became dizzy, lightheaded and short of breath, but never passed out. Denies pain at the time. Able to move all extremities. Respirations unlabored. Lung sounds clear and equal bilaterally. He is conscious alert and oriented x4.

## 2012-05-08 NOTE — ED Notes (Signed)
Pt transported to xray 

## 2012-05-08 NOTE — Progress Notes (Signed)
ANTICOAGULATION CONSULT NOTE - Initial Consult  Pharmacy Consult for heparin  Indication: chest pain/ACS  No Known Allergies  Patient Measurements: Height: 5\' 11"  (180.3 cm) Weight: 259 lb 9.6 oz (117.754 kg) IBW/kg (Calculated) : 75.3    Vital Signs: Temp: 98 F (36.7 C) (12/08 2015) Temp src: Oral (12/08 2015) BP: 133/57 mmHg (12/08 2015) Pulse Rate: 80  (12/08 2015)  Labs:  Basename 05/08/12 1350  HGB 12.6*  HCT 38.5*  PLT 210  APTT --  LABPROT --  INR --  HEPARINUNFRC --  CREATININE 0.99  CKTOTAL --  CKMB --  TROPONINI --    Estimated Creatinine Clearance: 73.8 ml/min (by C-G formula based on Cr of 0.99).   Medical History: Past Medical History  Diagnosis Date  . CAD (coronary artery disease)     a. PTCA 2001. b. f/u cath 2008: mild nonobstructive CAD.  Marland Kitchen Hypertension   . Acute respiratory failure 09/25/11    hypoxic  . Peripheral vascular disease     patient denies knowledge of, but listed in his chart  . Sleep apnea   . High cholesterol   . Anxiety   . Pleural effusion     Parapneumonic ?r/t PNA s/p thoracentesis 2008  . GERD (gastroesophageal reflux disease)   . Colon polyps     Medications:  Prescriptions prior to admission  Medication Sig Dispense Refill  . aspirin EC 81 MG tablet Take 81 mg by mouth daily.        . furosemide (LASIX) 40 MG tablet Take 40 mg by mouth every other day. Usually takes on MWF      . guaiFENesin (MUCINEX) 600 MG 12 hr tablet Take 1,200 mg by mouth 2 (two) times daily as needed. For congestion      . lisinopril (PRINIVIL,ZESTRIL) 10 MG tablet Take 5 mg by mouth daily.       . nitroGLYCERIN (NITROSTAT) 0.4 MG SL tablet Place 0.4 mg under the tongue every 5 (five) minutes as needed. Chest pain      . omeprazole (PRILOSEC) 20 MG capsule Take 20 mg by mouth 2 (two) times daily.       . sertraline (ZOLOFT) 100 MG tablet Take 100 mg by mouth daily.       . simvastatin (ZOCOR) 80 MG tablet Take 80 mg by mouth at bedtime.       Marland Kitchen terazosin (HYTRIN) 2 MG capsule Take 2 mg by mouth at bedtime.      . traZODone (DESYREL) 50 MG tablet Take 25 mg by mouth at bedtime.        Assessment: Bryan Hatfield is an 78 yo man w/ hx CAD admitted with weakness/sob/diaphoresis to start heparin per pharmacy protocol for r/o ACS.  Wt is 117.8 kg. H/H wnl and pltc wnl.   Troponin negative x 2. No bleeding reported.    Goal of Therapy:  Heparin level 0.3-0.7 units/ml Monitor platelets by anticoagulation protocol: Yes   Plan:  Give 4000 units bolus x 1 Start heparin infusion at 1650 units/hr Check anti-Xa level in 6 hours and daily while on heparin Continue to monitor H&H and platelets- -heparin level with 0500 am cardiac enzyme ordered by MD  Herby Abraham, Pharm.D. [phone] 05/08/2012 9:37 PM

## 2012-05-08 NOTE — ED Provider Notes (Signed)
Medical screening examination/treatment/procedure(s) were conducted as a shared visit with non-physician practitioner(s) and myself.  I personally evaluated the patient during the encounter  Episode of SOB, diaphoresis, nausea, after exertion, similar to previous MI.  EKG unchanged. Clean cath 2008. Concern for unstable angina. ASA, heparin, cards consult.  Glynn Octave, MD 05/08/12 [phone]

## 2012-05-08 NOTE — ED Provider Notes (Signed)
History     CSN: [id number]  Arrival date & time 05/08/12  1307   First MD Initiated Contact with Patient 05/08/12 1313      No chief complaint on file.   (Consider location/radiation/quality/duration/timing/severity/associated sxs/prior treatment) HPI  Bryan Hatfield is a 78 y.o. male past medical history significant for coronary artery disease, chronic respiratory failure with hypoxia, complaining of weakness and worsening DOE x2 weeks. Pt was taking out the garbage this AM and had severe SOB associated with diaphoresis and nausea. Patient denies any chest pain, he endorses a mild intermittent epigastric pain over the course last several weeks. Denies emesis or change in bowel or bladder habits. Patient is on home O2 intermittently as needed. Patient had myocardial infarction in 2003 with similar symptoms of dyspnea and diaphoresis.  Cards: Dr. Verdis Prime with [facility]  Past Medical History  Diagnosis Date  . Blockage of coronary artery of heart   . Hypertension   . Acute respiratory failure 09/25/11    hypoxic  . Chronic respiratory failure with hypoxia   . Peripheral vascular disease   . Myocardial infarction ~ 2003  . Angina   . Sleep apnea   . High cholesterol   . Anxiety     Past Surgical History  Procedure Date  . Hemorrhoid surgery 1960's  . Coronary angioplasty 01/2000  . Cataract extraction w/ intraocular lens  implant, bilateral ~ 2006    History reviewed. No pertinent family history.  History  Substance Use Topics  . Smoking status: Former Smoker -- 15 years    Types: Cigarettes    Quit date: 06/01/1986  . Smokeless tobacco: Never Used     Comment: 09/25/11 "quit smoking ~ 25 years ago; only smoked ~ 1 pk/wk"  . Alcohol Use: No      Review of Systems  Constitutional: Positive for diaphoresis. Negative for fever.  Respiratory: Positive for shortness of breath.   Cardiovascular: Negative for chest pain.  Gastrointestinal: Negative for nausea, vomiting,  abdominal pain and diarrhea.  All other systems reviewed and are negative.    Allergies  Review of patient's allergies indicates no known allergies.  Home Medications   Current Outpatient Rx  Name  Route  Sig  Dispense  Refill  . ASPIRIN EC 81 MG PO TBEC   Oral   Take 81 mg by mouth daily.           Marland Kitchen CLONAZEPAM 1 MG PO TABS   Oral   Take 1 mg by mouth 2 (two) times daily as needed. For anxiety.         . FUROSEMIDE 40 MG PO TABS   Oral   Take 40 mg by mouth every other day. Usually takes on MWF          . GUAIFENESIN ER 600 MG PO TB12   Oral   Take 2 tablets (1,200 mg total) by mouth 2 (two) times daily.   6 tablet   0   . LISINOPRIL 10 MG PO TABS   Oral   Take 5-10 mg by mouth daily.          Marland Kitchen NITROGLYCERIN 0.4 MG SL SUBL   Sublingual   Place 0.4 mg under the tongue every 5 (five) minutes as needed.           Marland Kitchen OMEPRAZOLE 20 MG PO CPDR   Oral   Take 20 mg by mouth 2 (two) times daily.           Marland Kitchen  PREDNISONE 10 MG PO TABS      Take 4 tablets daily for 2 days, 3 tablets daily for 2 days, then 2 tablets daily for 2 days, then 1 tablet daily for 2 days   20 tablet   0   . PRESCRIPTION MEDICATION   Topical   Apply 1 application topically daily. Clindamycin phosphate 1%         . PRESCRIPTION MEDICATION   Topical   Apply 1 application topically daily. Clotrimazole cream         . SERTRALINE HCL 100 MG PO TABS   Oral   Take 100 mg by mouth daily.           Marland Kitchen SIMVASTATIN 40 MG PO TABS   Oral   Take 40 mg by mouth every evening.         Marland Kitchen TERAZOSIN HCL 1 MG PO CAPS   Oral   Take 2 mg by mouth at bedtime.           There were no vitals taken for this visit.  Physical Exam  Nursing note and vitals reviewed. Constitutional: He is oriented to person, place, and time. He appears well-developed and well-nourished. No distress.       Obese   HENT:  Head: Normocephalic and atraumatic.  Mouth/Throat: Oropharynx is clear and moist.   Eyes: Conjunctivae normal and EOM are normal. Pupils are equal, round, and reactive to light.  Cardiovascular: Normal rate and regular rhythm.   Pulmonary/Chest: Effort normal and breath sounds normal. No stridor. No respiratory distress. He has no wheezes. He has no rales. He exhibits no tenderness.  Abdominal: Soft. He exhibits no distension and no mass. There is no tenderness. There is no rebound and no guarding.  Musculoskeletal: Normal range of motion.  Neurological: He is alert and oriented to person, place, and time.  Psychiatric: He has a normal mood and affect.    ED Course  Procedures (including critical care time)  Labs Reviewed  CBC WITH DIFFERENTIAL - Abnormal; Notable for the following:    RBC 4.17 (*)     Hemoglobin 12.6 (*)     HCT 38.5 (*)     All other components within normal limits  COMPREHENSIVE METABOLIC PANEL - Abnormal; Notable for the following:    Glucose, Bld 115 (*)     GFR calc non Af Amer 74 (*)     GFR calc Af Amer 85 (*)     All other components within normal limits  GLUCOSE, CAPILLARY - Abnormal; Notable for the following:    Glucose-Capillary 122 (*)     All other components within normal limits  URINALYSIS, ROUTINE W REFLEX MICROSCOPIC  PRO B NATRIURETIC PEPTIDE  LIPASE, BLOOD  POCT I-STAT TROPONIN I   Dg Chest 2 View  05/08/2012  *RADIOLOGY REPORT*  Clinical Data: Weakness  CHEST - 2 VIEW  Comparison: 10/28/2011  Findings: Cardiomegaly again noted.  Stable elevation of the right hemidiaphragm.  No acute infiltrate or pulmonary edema.  Mild degenerative changes thoracic spine.  IMPRESSION: No active disease.  No significant change.   Original Report Authenticated By: Natasha Mead, M.D.     Date: 05/08/2012  Rate: 67  Rhythm: normal sinus rhythm  QRS Axis: left  Intervals: normal  ST/T Wave abnormalities: normal  Conduction Disutrbances:left anterior fascicular block  Narrative Interpretation:   Old EKG Reviewed: unchanged    1. Dyspnea        MDM  Bryan Hatfield is  a 78 y.o. male dyspnea and diaphoresis onset this a.m. during exertion. Patient had similar experiences his last MI 10 years ago. EKG is nonischemic and first troponin is negative. Urinalysis and blood work are otherwise unremarkable.  Concerned this is the patient's anginal equivalent. Will consult the cardiology  Dr. Shelva Majestic to evaluate the Pt and make recommendation for dispo.   Case signed out to resident Dr. Freida Busman at shift change.         Wynetta Emery, PA-C 05/08/12 1628

## 2012-05-08 NOTE — ED Notes (Signed)
Reports some sob, SPO2 93% RA while eating, placed on [place] 2L, alert, NAD, calm, interactive, feeding self, VSS, (denies: pain, nausea, dizziness or other sx), wife at [facility], preparing to call report.

## 2012-05-08 NOTE — ED Notes (Addendum)
Pt presents to department from home via [facility]. States generalized weakness x1 week. States symptoms became worse this morning when walking outside to take trash out. States he broke out into heavy sweat. Took x1 sublingual nitroglycerin at home, no relief. He is alert and oriented x4. 20g LAC. Denies pain. CBG 172.

## 2012-05-08 NOTE — H&P (Signed)
History and Physical  Patient ID: Bryan Hatfield MRN: [id number], DOB: July 12, 1926 Date of Encounter: 05/08/2012, 5:54 PM Primary Physician: Delorse Lek, MD Primary Cardiologist: Dr. Katrinka Blazing Deboraha Sprang)  Chief Complaint: weakness, SOB, diaphoresis  HPI: Bryan Hatfield is an 78 y/o M with history of CAD s/p PTCA 2001, sleep apnea, remote pleural effusion s/p thoracentesis who presented to [facility] concerned about symptoms similar to his prior angina. Today when he walked back up to the house after taking out the trash, he felt very weak, tired, short of breath and became extremely diaphoretic. He sat down in a recliner and was pouring sweat. His wife called 911 as these symptoms reminded them of how he felt prior to his angioplasty. He took 1 SL NTG prior to EMS arrival (no relief) and 2 baby aspirin. His symptoms shortly after he sat down in the recliner. He also endorses DOE for the past several weeks. [facility], but his exercise tolerance has steadily declined. The other day he had to stop after only 5 minutes due to dyspnea. He has not had any chest pain. His SOB resolves with rest. He has had brief back pain when he takes a deep breath but this has been present for a many number of years. No recent long travel, bedrest, or injury. No weight changes, LEE. He has orthopnea that he attributes to his sleep apnea. He also has had daily abdominal pain, like his stomach is "unsettled." This doesn't really have any relationship to meals. No melena, BRBPR, hematuria or hematemesis. In the ED, lipase/CMET unremarkable except CBG 115, pBNP WNL, troponin neg x 1, UA neg, Hgb mildly ? at 12.6. CXR: no active disease.   Past Medical History  Diagnosis Date  . CAD (coronary artery disease)     a. PTCA 2001. b. f/u cath 2008: mild nonobstructive CAD.  Marland Kitchen Hypertension   . Acute respiratory failure 09/25/11    hypoxic  . Peripheral vascular disease     patient  denies knowledge of, but listed in his chart  . Sleep apnea   . High cholesterol   . Anxiety   . Pleural effusion     Parapneumonic ?r/t PNA s/p thoracentesis 2008  . GERD (gastroesophageal reflux disease)   . Colon polyps      Most Recent Cardiac Studies: 2D Echo 2008 SUMMARY - Overall left ventricular systolic function was normal. Left ventricular ejection fraction was estimated to be 55 %. There were no left ventricular regional wall motion abnormalities. Left ventricular wall thickness was mildly increased. - The aortic root was at the upper limits of normal in size. - The right ventricle was dilated. Right ventricular systolic function was mildly reduced.  Cardiac Cath 06/2006 FINDINGS: The left main was calcified. There is a 25% ostial left main  lesion which did not cause catheter damping. The left circumflex was a  medium-sized vessel with luminal irregularities. There is a medium-  sized OM-1 also with luminal irregularities. The left anterior  descending was a large vessel in proximal and midportion with luminal  irregularities. The vessel tapered before reaching the apex. The D1 was  a large vessel. There is an ostial 50% lesion. D2 was small vessel.  The right coronary artery was a large dominant vessel with luminal  irregularities. This vessel supplied blood flow to the apex. The left  ventriculogram revealed normal ventricular function with an estimated EF  of 60%.  HEMODYNAMIC RESULTS: Left ventricular pressure of 91/6 with an LVEDP of  25, aortic pressure 91/53 with a mean aortic pressure of 69 mmHg. The  abdominal aortogram revealed no evidence of renal artery stenosis.  There are mild irregularities of the abdominal aorta.  IMPRESSION:  1. No significant coronary artery disease.  2. Mildly increased left ventricular end-diastolic pressure.  3. Normal left ventricular function.  4. No renal artery stenosis.  RECOMMENDATIONS: Continue medical therapy. We will  increase the  patient's Lasix from 10-20 mg daily. His symptoms of dyspnea on  exertion may be somewhat related to his elevated LVEDP. The patient  will follow-up with Dr. Katrinka Blazing.    Surgical History:  Past Surgical History  Procedure Date  . Hemorrhoid surgery 1960's  . Coronary angioplasty 01/2000  . Cataract extraction w/ intraocular lens  implant, bilateral ~ 2006     Home Meds: Prior to Admission medications   Medication Sig Start Date End Date Taking? Authorizing Provider  aspirin EC 81 MG tablet Take 81 mg by mouth daily.     Yes Historical Provider, MD  furosemide (LASIX) 40 MG tablet Take 40 mg by mouth every other day. Usually takes on MWF   Yes Historical Provider, MD  guaiFENesin (MUCINEX) 600 MG 12 hr tablet Take 1,200 mg by mouth 2 (two) times daily as needed. For congestion 09/28/11 09/27/12 Yes Simbiso Ranga, MD  lisinopril (PRINIVIL,ZESTRIL) 10 MG tablet Take 5 mg by mouth daily.    Yes Historical Provider, MD  nitroGLYCERIN (NITROSTAT) 0.4 MG SL tablet Place 0.4 mg under the tongue every 5 (five) minutes as needed. Chest pain   Yes Historical Provider, MD  omeprazole (PRILOSEC) 20 MG capsule Take 20 mg by mouth 2 (two) times daily.    Yes Historical Provider, MD  sertraline (ZOLOFT) 100 MG tablet Take 100 mg by mouth daily.    Yes Historical Provider, MD  simvastatin (ZOCOR) 80 MG tablet Take 80 mg by mouth at bedtime.   Yes Historical Provider, MD  terazosin (HYTRIN) 2 MG capsule Take 2 mg by mouth at bedtime.   Yes Historical Provider, MD  traZODone (DESYREL) 50 MG tablet Take 25 mg by mouth at bedtime.   Yes Historical Provider, MD    Allergies: No Known Allergies  History   Social History  . Marital Status: Married    Spouse Name: N/A    Number of Children: N/A  . Years of Education: N/A   Occupational History  . Not on file.   Social History Main Topics  . Smoking status: Former Smoker -- 15 years    Types: Cigarettes    Quit date: 06/01/1986  .  Smokeless tobacco: Never Used     Comment: "quit smoking ~ 25 years ago; smoked ~ 1 pk/wk"  . Alcohol Use: No  . Drug Use: No  . Sexually Active: No   Other Topics Concern  . Not on file   Social History Narrative  . No narrative on file     Family History  Problem Relation Age of Onset  . Stroke      Mother and father    Review of Systems: General: negative for chills, fever, night sweats or weight changes.  Cardiovascular: see above. No palpitations Dermatological: negative for rash Respiratory: negative for cough or wheezing Urologic: negative for hematuria Abdominal: negative for nausea, vomiting, diarrhea, bright red blood per rectum, melena, or hematemesis. abd pain as above. He is vigilant about checking his stools. Neurologic: negative for  visual changes, syncope, or dizziness. No falls.  All other systems reviewed and are otherwise negative except as noted above.  Labs:   Lab Results  Component Value Date   WBC 7.1 05/08/2012   HGB 12.6* 05/08/2012   HCT 38.5* 05/08/2012   MCV 92.3 05/08/2012   PLT 210 05/08/2012     Lab 05/08/12 1350  NA 139  K 4.3  CL 103  CO2 26  BUN 19  CREATININE 0.99  CALCIUM 8.6  PROT 6.5  BILITOT 0.3  ALKPHOS 71  ALT 20  AST 24  GLUCOSE 115*  lipase 23 UA negative Troponin 0.00 pBNP 86  Radiology/Studies:  Dg Chest 2 View 05/08/2012  *RADIOLOGY REPORT*  Clinical Data: Weakness  CHEST - 2 VIEW  Comparison: 10/28/2011  Findings: Cardiomegaly again noted.  Stable elevation of the right hemidiaphragm.  No acute infiltrate or pulmonary edema.  Mild degenerative changes thoracic spine. IMPRESSION: No active disease.  No significant change.   Original Report Authenticated By: Natasha Mead, M.D.     EKG: NSR 67bpm, LAFB, late precordial R/S transition no significant change from 08/2011  Physical Exam: Blood pressure 111/58, pulse 68, temperature 98.3 F (36.8 C), temperature source Oral, resp. rate 18, SpO2 94.00%. General: Well  developed, well nourished obese WM in no acute distress. Head: Normocephalic, atraumatic, sclera non-icteric, no xanthomas, nares are without discharge. Neck: Negative for carotid bruits. JVD not elevated. Lungs: Clear bilaterally to auscultation without wheezes, rales, or rhonchi. Breathing is unlabored. Heart: RRR with S1 S2. No murmurs, rubs, or gallops appreciated. Abdomen: Soft, non-tender, rounded/obese with normoactive bowel sounds. No hepatomegaly. No rebound/guarding. No obvious abdominal masses. Msk:  Strength and tone appear normal for age. Extremities: No clubbing or cyanosis. No edema.  Distal pedal pulses are 2+ and equal bilaterally. Neuro: Alert and oriented X 3. No focal deficit. No facial asymmetry. Moves all extremities spontaneously. He makes several facial movements suggestive of possible tardive dyskinesia. Psych:  Responds to questions appropriately with a normal affect.   ASSESSMENT AND PLAN:   1. Unstable angina with history of CAD - SOB/diaphoresis are suspicious for [place]. Symptoms similar to prior angina. 3V coronary calcifications incidentally noted by CT 08/2011. Thus far, no objective evidence of acute ischemia. Will admit, cycle cardiac enzymes, and place on IV heparin. He may require cardiac catheterization tomorrow. Will keep NPO after midnight and he will be picked up by [facility] in the morning. Continue ASA, statin. Add low dose metoprolol. 2. HTN - controlled. Continue current regimen. 3. Sleep apnea - continue CPAP in house. 4. GERD - he reports recent "unsettled" abdominal pain. Not clearly related to #1. LFTs, lipase WNL. Hgb slightly down but patient vehemently denies any bleeding. Hiatal hernia and one small gallstone noted on 08/2011 CT. Consider GI eval. 5. Mild anemia - check hemoccults. 6. Anxiety - stable. 7. Hyperglycemia - check A1C.  Signed, Ronie Spies PA-C 05/08/2012, 5:54 PM Patient seen and examined and history reviewed. The above  findings and plan were reviewed and amended. 78 yo WM who appears younger than stated age and is quite active presents with symptoms concerning for unstable angina. Will admit and cycle cardiac enzymes. Start IV heparin and low dose metoprolol. Will keep NPO tonight until Dr. Katrinka Blazing assesses in am. May need cardiac cath.  Theron Arista [facility]  05/08/2012 6:03 PM

## 2012-05-09 ENCOUNTER — Observation Stay (HOSPITAL_COMMUNITY): Payer: Medicare Other

## 2012-05-09 DIAGNOSIS — I25119 Atherosclerotic heart disease of native coronary artery with unspecified angina pectoris: Secondary | ICD-10-CM | POA: Diagnosis present

## 2012-05-09 LAB — CBC
HCT: 38.5 % — ABNORMAL LOW (ref 39.0–52.0)
Hemoglobin: 12.8 g/dL — ABNORMAL LOW (ref 13.0–17.0)
MCH: 31.1 pg (ref 26.0–34.0)
RBC: 4.12 MIL/uL — ABNORMAL LOW (ref 4.22–5.81)

## 2012-05-09 LAB — TSH: TSH: 5.213 u[IU]/mL — ABNORMAL HIGH (ref 0.350–4.500)

## 2012-05-09 LAB — HEPARIN LEVEL (UNFRACTIONATED): Heparin Unfractionated: 0.73 IU/mL — ABNORMAL HIGH (ref 0.30–0.70)

## 2012-05-09 LAB — BASIC METABOLIC PANEL
BUN: 17 mg/dL (ref 6–23)
CO2: 26 mEq/L (ref 19–32)
Glucose, Bld: 110 mg/dL — ABNORMAL HIGH (ref 70–99)
Potassium: 3.9 mEq/L (ref 3.5–5.1)
Sodium: 141 mEq/L (ref 135–145)

## 2012-05-09 LAB — HEMOGLOBIN A1C: Mean Plasma Glucose: 126 mg/dL — ABNORMAL HIGH (ref <117)

## 2012-05-09 LAB — D-DIMER, QUANTITATIVE: D-Dimer, Quant: 0.32 ug/mL-FEU (ref 0.00–0.48)

## 2012-05-09 MED ORDER — REGADENOSON 0.4 MG/5ML IV SOLN
INTRAVENOUS | Status: AC
  Administered 2012-05-09: 0.4 mg via INTRAVENOUS
  Filled 2012-05-09: qty 5

## 2012-05-09 MED ORDER — TECHNETIUM TC 99M SESTAMIBI GENERIC - CARDIOLITE
30.0000 | Freq: Once | INTRAVENOUS | Status: AC | PRN
  Administered 2012-05-09: 30 via INTRAVENOUS

## 2012-05-09 MED ORDER — TECHNETIUM TC 99M SESTAMIBI GENERIC - CARDIOLITE
10.0000 | Freq: Once | INTRAVENOUS | Status: AC | PRN
  Administered 2012-05-09: 10 via INTRAVENOUS

## 2012-05-09 MED ORDER — TOPROL XL 25 MG PO TB24: 25.0000 mg | ORAL_TABLET | Freq: Every day | ORAL | Status: DC

## 2012-05-09 MED ORDER — REGADENOSON 0.4 MG/5ML IV SOLN
0.4000 mg | Freq: Once | INTRAVENOUS | Status: AC
  Administered 2012-05-09: 0.4 mg via INTRAVENOUS

## 2012-05-09 NOTE — Progress Notes (Signed)
Pt provided with dc instructions and education. Pt verbalized understanding. Pt has no questinos at this time. Aware to follow up with Dr Katrinka Blazing III in one week. IV removed with tip intact. Heart monitor cleaned and returned to front. Levonne Spiller, RN

## 2012-05-09 NOTE — Progress Notes (Signed)
ANTICOAGULATION CONSULT NOTE -  Follow-up Consult  Pharmacy Consult for heparin  Indication: chest pain/ACS  No Known Allergies  Patient Measurements: Height: 5\' 11"  (180.3 cm) Weight: 259 lb 9.6 oz (117.754 kg) IBW/kg (Calculated) : 75.3  Heparin dosing wt: 101 kg   Vital Signs: Temp: 98.4 F (36.9 C) (12/09 0600) BP: 116/64 mmHg (12/09 0600) Pulse Rate: 62  (12/09 0743)  Labs:  Basename 05/09/12 0820 05/08/12 2125 05/08/12 1350  HGB 12.8* -- 12.6*  HCT 38.5* -- 38.5*  PLT 163 -- 210  APTT -- -- --  LABPROT -- 13.1 --  INR -- 1.00 --  HEPARINUNFRC 0.73* -- --  CREATININE 1.01 -- 0.99  CKTOTAL -- -- --  CKMB -- -- --  TROPONINI <0.30 <0.30 --    Estimated Creatinine Clearance: 72.3 ml/min (by C-G formula based on Cr of 1.01).   Assessment: Bryan Hatfield is an 77 yo man w/ hx CAD admitted with weakness/sob/diaphoresis on heparin for r/o ACS.  Heparin level this am slightly greater than goal at 0.73 with heparin at 1650 units/hr.  H/H stable but platelets decreased this am.  No bleeding noted.  Currently in nuc med.  Goal of Therapy:  Heparin level 0.3-0.7 units/ml Monitor platelets by anticoagulation protocol: Yes   Plan:  - Decrease heparin slightly to 1550 units/hr - Check 8h heparin level - Check daily heparin level and CBC

## 2012-05-09 NOTE — Progress Notes (Signed)
Utilization review completed.  

## 2012-05-09 NOTE — Discharge Summary (Addendum)
Patient ID: Bryan Hatfield MRN: [id number] DOB/AGE: [id number] 76 y.o.  Admit date: 05/08/2012 Discharge date: 05/09/2012  Primary Discharge Diagnosis: Chest pain consistent with unstable angina. Ischemia and MI ruled out  Secondary Discharge Diagnosis: Coronary artery disease Sleep apnea Obesity Abnormality of gait  Patient Active Problem List  Diagnosis  . INSOMNIA UNSPECIFIED  . SLEEP APNEA  . ABNORMALITY OF GAIT  . DYSPNEA  . Pneumonia  . Obesity  . Unstable angina  . Coronary artery disease   Significant Diagnostic Studies: Pharmacologic nuclear study with low risk pattern  Clinical Data: Chest pain. Nausea. Diaphoresis.  MYOCARDIAL IMAGING WITH SPECT (REST AND PHARMACOLOGIC-STRESS)  GATED LEFT VENTRICULAR WALL MOTION STUDY  LEFT VENTRICULAR EJECTION FRACTION  Technique: Standard myocardial SPECT imaging was performed after  resting intravenous injection of 10 mCi Tc-46m sestamibi.  Subsequently, intravenous infusion of Lexiscan was performed under  the supervision of the Cardiology staff. At peak effect of the  drug, 30 mCi Tc-52m sestamibi was injected intravenously and  standard myocardial SPECT imaging was performed. Quantitative  gated imaging was also performed to evaluate left ventricular wall  motion, and estimate left ventricular ejection fraction.  Comparison: None  Findings: Exam quality is good. Myocardial SPECT images show a  small to moderate area of reversibility in the inferolateral wall  of the left ventricle, suspicious for myocardial ischemia.  Left ventricular wall motion study shows no evidence of regional  wall motion abnormality.  Estimated left ventricular ejection fraction is 67%.  IMPRESSION:  1. Suspect inducible myocardial ischemia in the inferolateral  wall.  2. Normal left ventricular wall motion study, with estimated  ejection fraction of 67%.  Original Report Authenticated By: Myles Rosenthal, M.D.  Consults: None   Hospital  Course:  The patient has weakness, fatigue, and dyspnea after carrying the trash to the street. His markers and ECG's were unremarkable. Pharmacologic myocardial perfusion imaging revealed a low risk study showing inferolateral ischemia. We started medical therapy in the form of metoprolol. Further evaluation will depend on clinical course and response to therapy. If rest symptoms, he is advised to return to the hospital. We will f/u in our office in 2 days.   Discharge Exam: Blood pressure 123/71, pulse 68, temperature 97.5 F (36.4 C), temperature source Oral, resp. rate 20, height 5\' 11"  (1.803 m), weight 117.754 kg (259 lb 9.6 oz), SpO2 95.00%.   Chest is clear Cardiac exam is unremarkable No edema  Labs:   Lab Results  Component Value Date   WBC 5.9 05/09/2012   HGB 12.8* 05/09/2012   HCT 38.5* 05/09/2012   MCV 93.4 05/09/2012   PLT 163 05/09/2012     Lab 05/09/12 0820 05/08/12 1350  NA 141 --  K 3.9 --  CL 105 --  CO2 26 --  BUN 17 --  CREATININE 1.01 --  CALCIUM 8.6 --  PROT -- 6.5  BILITOT -- 0.3  ALKPHOS -- 71  ALT -- 20  AST -- 24  GLUCOSE 110* --   Lab Results  Component Value Date   TROPONINI <0.30 05/09/2012    Lab Results  Component Value Date   CHOL 157 05/09/2012   Lab Results  Component Value Date   HDL 71 05/09/2012   Lab Results  Component Value Date   LDLCALC 74 05/09/2012   Lab Results  Component Value Date   TRIG 58 05/09/2012   Lab Results  Component Value Date   CHOLHDL 2.2 05/09/2012   No results  found for this basename: LDLDIRECT    D dimer  Radiology:   *RADIOLOGY REPORT*  Clinical Data: Weakness  CHEST - 2 VIEW  Comparison: 10/28/2011  Findings: Cardiomegaly again noted. Stable elevation of the right  hemidiaphragm. No acute infiltrate or pulmonary edema. Mild  degenerative changes thoracic spine.  IMPRESSION:  No active disease. No significant change.  Original Report Authenticated By: Natasha Mead, M.D.   EKG:   Normal  FOLLOW UP PLANS AND APPOINTMENTS    Medication List     As of 05/09/2012  5:33 PM    TAKE these medications         aspirin EC 81 MG tablet   Take 81 mg by mouth daily.      furosemide 40 MG tablet   Commonly known as: LASIX   Take 40 mg by mouth every other day. Usually takes on MWF      guaiFENesin 600 MG 12 hr tablet   Commonly known as: MUCINEX   Take 1,200 mg by mouth 2 (two) times daily as needed. For congestion      lisinopril 10 MG tablet   Commonly known as: PRINIVIL,ZESTRIL   Take 5 mg by mouth daily.      nitroGLYCERIN 0.4 MG SL tablet   Commonly known as: NITROSTAT   Place 0.4 mg under the tongue every 5 (five) minutes as needed. Chest pain      omeprazole 20 MG capsule   Commonly known as: PRILOSEC   Take 20 mg by mouth 2 (two) times daily.      sertraline 100 MG tablet   Commonly known as: ZOLOFT   Take 100 mg by mouth daily.      simvastatin 80 MG tablet   Commonly known as: ZOCOR   Take 80 mg by mouth at bedtime.      terazosin 2 MG capsule   Commonly known as: HYTRIN   Take 2 mg by mouth at bedtime.      TOPROL XL 25 MG 24 hr tablet   Generic drug: metoprolol succinate   Take 1 tablet (25 mg total) by mouth daily.      traZODone 50 MG tablet   Commonly known as: DESYREL   Take 25 mg by mouth at bedtime.        Follow-up Information    Follow up with Lesleigh Noe, MD. On 05/11/2012. (12 N)    Contact information:   [address] [place] [address] [phone]          BRING ALL MEDICATIONS WITH YOU TO FOLLOW UP APPOINTMENTS  Time spent with patient to include physician time:  30 minutes Signed: Lesleigh Noe 05/09/2012, 5:33 PM

## 2012-05-09 NOTE — Progress Notes (Signed)
Patient is on cpap 14cmH20 AUTO setting with 2lpm 02 bleed in. Patient is tolerating cpap well at this time. RT will continue to monitor.

## 2012-05-09 NOTE — Progress Notes (Signed)
Patient Name: Bryan Hatfield Date of Encounter: 05/09/2012    SUBJECTIVE: Symptoms are vague. He had diaphoresis and nausea with dyspnea after taking the trash out yesterday.Lasted 15-20 mins. Has riled out and all labs and ECG's are normal.  TELEMETRY:  NSR: Filed Vitals:   05/08/12 1958 05/08/12 2015 05/09/12 0600 05/09/12 0743  BP: 106/64 133/57 116/64   Pulse: 77 80 68 62  Temp:  98 F (36.7 C) 98.4 F (36.9 C)   TempSrc:  Oral    Resp: 21 16 18    Height:  5\' 11"  (1.803 m)    Weight:  117.754 kg (259 lb 9.6 oz)    SpO2: 97% 97% 99%     Intake/Output Summary (Last 24 hours) at 05/09/12 0811 Last data filed at 05/09/12 0500  Gross per 24 hour  Intake 455.11 ml  Output    225 ml  Net 230.11 ml    LABS: Basic Metabolic Panel:  Basename 05/08/12 1350  NA 139  K 4.3  CL 103  CO2 26  GLUCOSE 115*  BUN 19  CREATININE 0.99  CALCIUM 8.6  MG --  PHOS --   CBC:  Basename 05/08/12 1350  WBC 7.1  NEUTROABS 5.3  HGB 12.6*  HCT 38.5*  MCV 92.3  PLT 210   Cardiac Enzymes:  Basename 05/08/12 2125  CKTOTAL --  CKMB --  CKMBINDEX --  TROPONINI <0.30  Radiology/Studies:  normal  ECG: normal on serial tracings Physical Exam: Blood pressure 116/64, pulse 62, temperature 98.4 F (36.9 C), temperature source Oral, resp. rate 18, height 5\' 11"  (1.803 m), weight 117.754 kg (259 lb 9.6 oz), SpO2 99.00%. Weight change:    Chest is clear. Cardiac exam is unremarkable No peripheral edema. Neck veins are flat.  ASSESSMENT:  1. Nausea and diaphoresis with dyspnea of uncertain cause. He had chest pain with prior angina. And before PCI 13 years ago. Had dyspnea 5 years ago when cath was unremarkable.  2. Obesity  3. OSA  4. Hypertension  Plan:  1. Pharmacologic nuclear 2. D-dimer  Signed, Lesleigh Noe 05/09/2012, 8:11 AM

## 2012-05-19 ENCOUNTER — Ambulatory Visit (INDEPENDENT_AMBULATORY_CARE_PROVIDER_SITE_OTHER): Payer: Medicare Other | Admitting: Critical Care Medicine

## 2012-05-19 ENCOUNTER — Encounter: Payer: Self-pay | Admitting: Critical Care Medicine

## 2012-05-19 VITALS — BP 112/50 | HR 66 | Temp 98.1°F | Ht 71.0 in | Wt 263.0 lb

## 2012-05-19 DIAGNOSIS — J441 Chronic obstructive pulmonary disease with (acute) exacerbation: Secondary | ICD-10-CM

## 2012-05-19 MED ORDER — BUDESONIDE-FORMOTEROL FUMARATE 160-4.5 MCG/ACT IN AERO: 2.0000 | INHALATION_SPRAY | Freq: Two times a day (BID) | RESPIRATORY_TRACT | Status: DC

## 2012-05-19 MED ORDER — AZITHROMYCIN 250 MG PO TABS: 250.0000 mg | ORAL_TABLET | Freq: Every day | ORAL | Status: DC

## 2012-05-19 MED ORDER — PREDNISONE 10 MG PO TABS: ORAL_TABLET | ORAL | Status: DC

## 2012-05-19 MED ORDER — LOSARTAN POTASSIUM 50 MG PO TABS: 50.0000 mg | ORAL_TABLET | Freq: Every day | ORAL | Status: DC

## 2012-05-19 NOTE — Assessment & Plan Note (Signed)
Asthmatic bronchitis with very mild peripheral airflow obstruction with mild acute flare Level of dyspnea and weakness appears to be out of proportion to the patient's pulmonary function ACE inhibitor may be contributing to upper airway instability and cyclical cough, changing over to an ARB may improve this symptom complex Plan Prednisone 10mg  Take 4 tablets daily for 5 days then stop Azithromycin 250mg  Take two once then one daily until gone Start Symbicort two puff twice daily Stop lisinopril Start Losartan 50mg  daily The patient's cardiologist was notified of the patient's current condition and will reassess the patient sooner than 6 weeks as planned previously

## 2012-05-19 NOTE — Patient Instructions (Addendum)
Prednisone 10mg  Take 4 tablets daily for 5 days then stop Azithromycin 250mg  Take two once then one daily until gone Start Symbicort two puff twice daily Stop lisinopril Start Losartan 50mg  daily I will call Dr Katrinka Blazing regarding your coronary artery issues Return 6 weeks

## 2012-05-19 NOTE — Progress Notes (Signed)
Subjective:    Patient ID: Bryan Hatfield, male    DOB: 10-Oct-1926, 78 y.o.   MRN: [id number]  HPI Comments: Previously seen VS for 2009 for pleural effusion Pt just in hospital Pt got weak and nausea, diaphoresis.  After taking out garbage. Pt adm 12/8- 12/9.  Cardiology Rec stress test.  Chest pain: Stress test positive inferolateral wall  Medication only recommended . Signed out as CAD Pt desired recheck on lungs.    Shortness of Breath This is a chronic problem. The current episode started more than 1 month ago. The problem occurs constantly (pt is dyspneic exertional if only goes a few feet). The problem has been rapidly worsening. Associated symptoms include abdominal pain, neck pain, a rash, sputum production and wheezing. Pertinent negatives include no chest pain, claudication, coryza, ear pain, fever, headaches, hemoptysis, leg pain, leg swelling, orthopnea, PND, rhinorrhea, sore throat or vomiting. The symptoms are aggravated by lying flat, any activity, exercise, fumes and odors. Associated symptoms comments: Mucus is green. Risk factors include smoking. He has tried nothing for the symptoms. His past medical history is significant for CAD and pneumonia. There is no history of allergies, aspirin allergies, asthma, bronchiolitis, chronic lung disease, COPD, DVT, a heart failure, PE or a recent surgery. (PNA 4/13 and in hospital)   Past Medical History  Diagnosis Date  . CAD (coronary artery disease)     a. PTCA 2001. b. f/u cath 2008: mild nonobstructive CAD.  Marland Kitchen Hypertension   . Acute respiratory failure 09/25/11    hypoxic  . Peripheral vascular disease     patient denies knowledge of, but listed in his chart  . Sleep apnea   . High cholesterol   . Anxiety   . Pleural effusion     Parapneumonic ?r/t PNA s/p thoracentesis 2008  . GERD (gastroesophageal reflux disease)   . Colon polyps      Family History  Problem Relation Age of Onset  . Stroke      Mother and father  .  Arthritis Mother   . Arthritis Sister   . Asthma Mother   . Emphysema Mother   . Stroke Sister      History   Social History  . Marital Status: Married    Spouse Name: N/A    Number of Children: N/A  . Years of Education: N/A   Occupational History  . 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 Smoking status: Former Smoker -- 0.5 packs/day for 10 years    Types: Cigarettes    Quit date: 06/01/1986  . Smokeless tobacco: Never Used     Comment: "quit smoking ~ 25 years ago; smoked ~ 1 pk/wk"  . Alcohol Use: No  . Drug Use: No  . Sexually Active: No   Other Topics Concern  . Not on file   Social History Narrative  . No narrative on file     No Known Allergies   Outpatient Prescriptions Prior to Visit  Medication Sig Dispense Refill  . aspirin EC 81 MG tablet Take 81 mg by mouth daily.        . furosemide (LASIX) 40 MG tablet Take 40 mg by mouth every other day. Usually takes on MWF      . guaiFENesin (MUCINEX) 600 MG 12 hr tablet Take 1,200 mg by mouth 2 (two) times daily as needed. For congestion      . nitroGLYCERIN (NITROSTAT) 0.4 MG  SL tablet Place 0.4 mg under the tongue every 5 (five) minutes as needed. Chest pain      . omeprazole (PRILOSEC) 20 MG capsule Take 20 mg by mouth 2 (two) times daily.       . sertraline (ZOLOFT) 100 MG tablet Take 100 mg by mouth daily.       . simvastatin (ZOCOR) 80 MG tablet Take 80 mg by mouth at bedtime.      Marland Kitchen terazosin (HYTRIN) 2 MG capsule Take 2 mg by mouth at bedtime.      . TOPROL XL 25 MG 24 hr tablet Take 1 tablet (25 mg total) by mouth daily.  30 tablet  30  . traZODone (DESYREL) 50 MG tablet Take 25 mg by mouth at bedtime.      . [DISCONTINUED] lisinopril (PRINIVIL,ZESTRIL) 10 MG tablet Take 5 mg by mouth daily.        Last reviewed on 05/19/2012 10:40 AM by Storm Frisk, MD     Review of Systems  Constitutional: Positive for diaphoresis, activity change and fatigue. Negative for  fever, chills, appetite change and unexpected weight change.  HENT: Positive for neck pain, neck stiffness and tinnitus. Negative for hearing loss, ear pain, nosebleeds, congestion, sore throat, facial swelling, rhinorrhea, sneezing, mouth sores, trouble swallowing, dental problem, voice change, postnasal drip, sinus pressure and ear discharge.   Eyes: Negative for photophobia, discharge, itching and visual disturbance.  Respiratory: Positive for cough, sputum production, shortness of breath and wheezing. Negative for apnea, hemoptysis, choking, chest tightness and stridor.   Cardiovascular: Negative for chest pain, palpitations, orthopnea, claudication, leg swelling and PND.  Gastrointestinal: Positive for nausea and abdominal pain. Negative for vomiting, constipation, blood in stool and abdominal distention.  Genitourinary: Negative for dysuria, urgency, frequency, hematuria, flank pain, decreased urine volume and difficulty urinating.  Musculoskeletal: Positive for back pain and gait problem. Negative for myalgias, joint swelling and arthralgias.  Skin: Positive for rash. Negative for color change and pallor.  Neurological: Positive for dizziness, tremors, weakness and light-headedness. Negative for seizures, syncope, speech difficulty, numbness and headaches.  Hematological: Negative for adenopathy. Bruises/bleeds easily.  Psychiatric/Behavioral: Positive for sleep disturbance. Negative for confusion and agitation. The patient is nervous/anxious.        Objective:   Physical Exam Filed Vitals:   05/19/12 1017  BP: 112/50  Pulse: 66  Temp: 98.1 F (36.7 C)  TempSrc: Oral  Height: 5\' 11"  (1.803 m)  Weight: 263 lb (119.296 kg)  SpO2: 95%    Gen: Pleasant, obese, in no distress,  normal affect  ENT: No lesions,  mouth clear,  oropharynx clear, no postnasal drip  Neck: No JVD, no TMG, no carotid bruits  Lungs: No use of accessory muscles, no dullness to percussion, clear breath  sounds without wheezes  Cardiovascular: RRR, heart sounds normal, no murmur or gallops, no peripheral edema  Abdomen: soft and NT, no HSM,  BS normal  Musculoskeletal: No deformities, no cyanosis or clubbing  Neuro: alert, non focal  Skin: Warm, no lesions or rashes  Chest x-rays are reviewed and show no active process on 05/08/2012  05/19/2012: Spirometry reveals FEV1 80% predicted FVC 92% predicted FEV1 FVC ratio 64% predicted FEF 25 7541% predicted compatible with very mild peripheral airflow obstruction   No desaturation with exertion on 05/19/2012     Assessment & Plan:   Obstructive chronic bronchitis with exacerbation Asthmatic bronchitis with very mild peripheral airflow obstruction with mild acute flare Level of dyspnea and weakness  appears to be out of proportion to the patient's pulmonary function ACE inhibitor may be contributing to upper airway instability and cyclical cough, changing over to an ARB may improve this symptom complex Plan Prednisone 10mg  Take 4 tablets daily for 5 days then stop Azithromycin 250mg  Take two once then one daily until gone Start Symbicort two puff twice daily Stop lisinopril Start Losartan 50mg  daily The patient's cardiologist was notified of the patient's current condition and will reassess the patient sooner than 6 weeks as planned previously   Updated Medication List Outpatient Encounter Prescriptions as of 05/19/2012  Medication Sig Dispense Refill  . aspirin EC 81 MG tablet Take 81 mg by mouth daily.        . cholecalciferol (VITAMIN D) 1000 UNITS tablet Take 1,000 Units by mouth daily.      . furosemide (LASIX) 40 MG tablet Take 40 mg by mouth every other day. Usually takes on MWF      . guaiFENesin (MUCINEX) 600 MG 12 hr tablet Take 1,200 mg by mouth 2 (two) times daily as needed. For congestion      . Multiple Vitamins-Minerals (ICAPS MV PO) Take 1 capsule by mouth daily.      . nitroGLYCERIN (NITROSTAT) 0.4 MG SL tablet  Place 0.4 mg under the tongue every 5 (five) minutes as needed. Chest pain      . omeprazole (PRILOSEC) 20 MG capsule Take 20 mg by mouth 2 (two) times daily.       . sertraline (ZOLOFT) 100 MG tablet Take 100 mg by mouth daily.       . simvastatin (ZOCOR) 80 MG tablet Take 80 mg by mouth at bedtime.      Marland Kitchen terazosin (HYTRIN) 2 MG capsule Take 2 mg by mouth at bedtime.      . TOPROL XL 25 MG 24 hr tablet Take 1 tablet (25 mg total) by mouth daily.  30 tablet  30  . traZODone (DESYREL) 50 MG tablet Take 25 mg by mouth at bedtime.      . [DISCONTINUED] lisinopril (PRINIVIL,ZESTRIL) 10 MG tablet Take 5 mg by mouth daily.       Marland Kitchen azithromycin (ZITHROMAX) 250 MG tablet Take 1 tablet (250 mg total) by mouth daily. Take two once then one daily until gone  6 each  0  . budesonide-formoterol (SYMBICORT) 160-4.5 MCG/ACT inhaler Inhale 2 puffs into the lungs 2 (two) times daily.  1 Inhaler  12  . losartan (COZAAR) 50 MG tablet Take 1 tablet (50 mg total) by mouth daily.  30 tablet  6  . predniSONE (DELTASONE) 10 MG tablet Take 4 tablets daily for 5 days then stop  20 tablet  0

## 2012-05-23 ENCOUNTER — Other Ambulatory Visit: Payer: Self-pay | Admitting: Interventional Cardiology

## 2012-05-27 ENCOUNTER — Inpatient Hospital Stay (HOSPITAL_BASED_OUTPATIENT_CLINIC_OR_DEPARTMENT_OTHER)
Admission: RE | Admit: 2012-05-27 | Payer: Medicare Other | Source: Ambulatory Visit | Admitting: Interventional Cardiology

## 2012-05-27 ENCOUNTER — Encounter (HOSPITAL_BASED_OUTPATIENT_CLINIC_OR_DEPARTMENT_OTHER): Admission: RE | Payer: Self-pay | Source: Ambulatory Visit

## 2012-05-27 SURGERY — JV LEFT AND RIGHT HEART CATHETERIZATION WITH CORONARY ANGIOGRAM
Anesthesia: Moderate Sedation

## 2012-05-31 ENCOUNTER — Inpatient Hospital Stay (HOSPITAL_BASED_OUTPATIENT_CLINIC_OR_DEPARTMENT_OTHER)
Admission: RE | Admit: 2012-05-31 | Discharge: 2012-05-31 | Disposition: A | Discharge status: Home / Self Care | Payer: Medicare Other | Source: Ambulatory Visit | Attending: Interventional Cardiology | Admitting: Interventional Cardiology

## 2012-05-31 ENCOUNTER — Encounter (HOSPITAL_BASED_OUTPATIENT_CLINIC_OR_DEPARTMENT_OTHER)
Admission: RE | Disposition: A | Discharge status: Home / Self Care | Payer: Self-pay | Source: Ambulatory Visit | Attending: Interventional Cardiology

## 2012-05-31 DIAGNOSIS — R0600 Dyspnea, unspecified: Secondary | ICD-10-CM | POA: Diagnosis present

## 2012-05-31 DIAGNOSIS — R9439 Abnormal result of other cardiovascular function study: Secondary | ICD-10-CM

## 2012-05-31 DIAGNOSIS — R0609 Other forms of dyspnea: Secondary | ICD-10-CM | POA: Insufficient documentation

## 2012-05-31 DIAGNOSIS — I2510 Atherosclerotic heart disease of native coronary artery without angina pectoris: Secondary | ICD-10-CM

## 2012-05-31 DIAGNOSIS — R0989 Other specified symptoms and signs involving the circulatory and respiratory systems: Secondary | ICD-10-CM | POA: Insufficient documentation

## 2012-05-31 LAB — POCT I-STAT 3, ART BLOOD GAS (G3+)
O2 Saturation: 96 %
TCO2: 30 mmol/L (ref 0–100)

## 2012-05-31 LAB — POCT I-STAT 3, VENOUS BLOOD GAS (G3P V)
Acid-Base Excess: 1 mmol/L (ref 0.0–2.0)
Bicarbonate: 27.6 mEq/L — ABNORMAL HIGH (ref 20.0–24.0)
O2 Saturation: 70 %
TCO2: 29 mmol/L (ref 0–100)
pO2, Ven: 40 mmHg (ref 30.0–45.0)

## 2012-05-31 SURGERY — JV LEFT AND RIGHT HEART CATHETERIZATION WITH CORONARY ANGIOGRAM
Anesthesia: Moderate Sedation

## 2012-05-31 MED ORDER — DIAZEPAM 5 MG PO TABS
5.0000 mg | ORAL_TABLET | ORAL | Status: AC
  Administered 2012-05-31: 5 mg via ORAL

## 2012-05-31 MED ORDER — SODIUM CHLORIDE 0.9 % IJ SOLN: 3.0000 mL | INTRAMUSCULAR | Status: DC | PRN

## 2012-05-31 MED ORDER — SODIUM CHLORIDE 0.9 % IV SOLN: INTRAVENOUS | Status: DC

## 2012-05-31 MED ORDER — SODIUM CHLORIDE 0.9 % IJ SOLN: 3.0000 mL | Freq: Two times a day (BID) | INTRAMUSCULAR | Status: DC

## 2012-05-31 MED ORDER — ACETAMINOPHEN 325 MG PO TABS: 650.0000 mg | ORAL_TABLET | ORAL | Status: DC | PRN

## 2012-05-31 MED ORDER — SODIUM CHLORIDE 0.9 % IV SOLN: 250.0000 mL | INTRAVENOUS | Status: DC | PRN

## 2012-05-31 MED ORDER — ONDANSETRON HCL 4 MG/2ML IJ SOLN: 4.0000 mg | Freq: Four times a day (QID) | INTRAMUSCULAR | Status: DC | PRN

## 2012-05-31 MED ORDER — ASPIRIN 81 MG PO CHEW
324.0000 mg | CHEWABLE_TABLET | ORAL | Status: AC
  Administered 2012-05-31: 324 mg via ORAL

## 2012-05-31 NOTE — CV Procedure (Signed)
     Diagnostic Cardiac Catheterization Report  Bryan Hatfield  78 y.o.  male 01/07/1927  Procedure Date: 05/31/2012 Referring Physician: Luisa Hart right, M.D. Primary Cardiologist:: Darci Needle, III, M.D.   PROCEDURE:  Left heart catheterization with selective coronary angiography, left ventriculogram.  INDICATIONS:  Dyspnea out of proportion to lung disease with a mildly abnormal nuclear study. The studies being done to rule out pulmonary hypertension and to exclude significant three-vessel coronary disease as the cause.  The risks, benefits, and details of the procedure were explained to the patient.  The patient verbalized understanding and wanted to proceed.  Informed written consent was obtained.  PROCEDURE TECHNIQUE:  After Xylocaine anesthesia a 4 French arterial and 7 French venous sheath was placed in the right femoral artery and vein with single anterior needle wall sticks.   Coronary angiography was done using a 4 [place] A2 MP, 4 [place] #4 JR, and 4 [place] #4 JL catheter.  Left ventriculography was done using a 4 [place] A2 MP catheter.    CONTRAST:  Total of 70 cc.  COMPLICATIONS:  None.    HEMODYNAMICS:  Aortic pressure was 104/56 mmHg; LV pressure was 101/11 mmHg ; LVEDP 14 mm mercury; right atrial mean 5 mm mercury; RV pressure 31/2 mmHg; main pulmonary artery pressure 33/12 mmHg; pulmonary capillary wedge pressure mean 10 mmHg; cardiac output by Fick X. 0.6 L per minute.  There was no gradient between the left ventricle and aorta.    ANGIOGRAPHIC DATA:   The left main coronary artery is heavily calcified with gentle ostial 25-30% narrowing.  The left anterior descending artery is heavy calcification throughout the proximal to mid segment. The LAD is naturally small stopping short of the left ventricular apex. There is eccentric 30% narrowing of the proximal to mid vessel. A large diagonal branch arises from the mid vessel. The diagonal and LAD are without significant  obstruction..  The left circumflex artery is moderately calcified in the proximal segment to obtuse marginal branches arise from this vessel. The first marginal contains 50% ostial narrowing the continuation of the circumflex beyond the first obtuse marginal contains 20% narrowing..  The right coronary artery is  heavily calcified, dominant, giving a large PDA and 2 left ventricular branches, and is widely patent throughout.Marland Kitchen  LEFT VENTRICULOGRAM:  Left ventricular angiogram was done in the 30 RAO projection and revealed normal left ventricular wall motion and systolic function with an estimated ejection fraction of 60%.    IMPRESSIONS:  1. Heavy three-vessel coronary calcification  2. Mild to moderate proximal and mid LAD disease up to 40%. There is proximal circumflex and OM one disease up to 50%. Despite heavy calcification the right coronary is widely patent.  3. Normal left ventricular systolic function with normal LVEDP and pulmonary capillary wedge  4. Normal pulmonary artery pressures   RECOMMENDATION:  There is no cardiac explanation for the patient's dyspnea. The nuclear study performed while hospitalized was a false positive study. No further cardiac evaluation is indicated.Marland Kitchen

## 2012-05-31 NOTE — OR Nursing (Signed)
Meal served 

## 2012-05-31 NOTE — OR Nursing (Signed)
Dr Smith at bedside to discuss results and treatment plan with pt and family 

## 2012-05-31 NOTE — OR Nursing (Signed)
Discharge instructions reviewed and signed, pt stated understanding, ambulated in hall without difficulty, site level 0, transported to wife's car via wheelchair 

## 2012-05-31 NOTE — H&P (Signed)
  Please see the scanned history and physical. Since the decision for the coronary arteriogram no significant changes have occurred. The patient still has significant dyspnea on exertion which pulmonary feels is out of proportion to his degree of lung disease. With a mildly abnormal nuclear study perhaps we are underestimating the severity of underlying coronary disease, hence angiography this morning to define anatomy. The procedure and its risks have been described to the patient and accepted.

## 2012-05-31 NOTE — OR Nursing (Signed)
Tegaderm dressing applied, site level 0, bedrest begins at 0920

## 2012-06-13 ENCOUNTER — Telehealth: Payer: Self-pay | Admitting: Critical Care Medicine

## 2012-06-13 NOTE — Telephone Encounter (Signed)
According to Dr. Lynelle Doctor last note, pt is to take Losartan.  [id number]

## 2012-06-14 NOTE — Telephone Encounter (Signed)
Patient Instructions     Prednisone 10mg  Take 4 tablets daily for 5 days then stop  Azithromycin 250mg  Take two once then one daily until gone  Start Symbicort two puff twice daily  Stop lisinopril  Start Losartan 50mg  daily  I will call Dr Katrinka Blazing regarding your coronary artery issues  Return 6 weeks   Pt's wife aware the pt should be taking the losartan and not lisinopril.

## 2012-06-30 ENCOUNTER — Ambulatory Visit (INDEPENDENT_AMBULATORY_CARE_PROVIDER_SITE_OTHER): Payer: Medicare Other | Admitting: Critical Care Medicine

## 2012-06-30 ENCOUNTER — Encounter: Payer: Self-pay | Admitting: Critical Care Medicine

## 2012-06-30 VITALS — BP 118/70 | HR 68 | Temp 98.1°F | Ht 71.0 in | Wt 260.0 lb

## 2012-06-30 DIAGNOSIS — J441 Chronic obstructive pulmonary disease with (acute) exacerbation: Secondary | ICD-10-CM

## 2012-06-30 NOTE — Progress Notes (Signed)
Subjective:    Patient ID: Bryan Hatfield, male    DOB: November 12, 1926, 79 y.o.   MRN: [id number]  HPI 06/30/2012 Dyspnea is the same. Cough is less. Remains dyspneic with exertion.  Pt remains weak. Has not been to rehab pulm. Pt denies any significant sore throat, nasal congestion or excess secretions, fever, chills, sweats, unintended weight loss, pleurtic or exertional chest pain, orthopnea PND, or leg swelling Pt denies any increase in rescue therapy over baseline, denies waking up needing it or having any early am or nocturnal exacerbations of coughing/wheezing/or dyspnea. Pt also denies any obvious fluctuation in symptoms with  weather or environmental change or other alleviating or aggravating factors    Past Medical History  Diagnosis Date  . CAD (coronary artery disease)     a. PTCA 2001. b. f/u cath 2008: mild nonobstructive CAD.  Marland Kitchen Hypertension   . Acute respiratory failure 09/25/11    hypoxic  . Peripheral vascular disease     patient denies knowledge of, but listed in his chart  . Sleep apnea   . High cholesterol   . Anxiety   . Pleural effusion     Parapneumonic ?r/t PNA s/p thoracentesis 2008  . GERD (gastroesophageal reflux disease)   . Colon polyps      Family History  Problem Relation Age of Onset  . Stroke      Mother and father  . Arthritis Mother   . Arthritis Sister   . Asthma Mother   . Emphysema Mother   . Stroke Sister      History   Social History  . Marital Status: Married    Spouse Name: N/A    Number of Children: N/A  . Years of Education: N/A   Occupational History  . 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 Smoking status: Former Smoker -- 0.5 packs/day for 10 years    Types: Cigarettes    Quit date: 06/01/1986  . Smokeless tobacco: Never Used     Comment: "quit smoking ~ 25 years ago; smoked ~ 1 pk/wk"  . Alcohol Use: No  . Drug Use: No  . Sexually Active: No   Other Topics Concern  . Not on file    Social History Narrative  . No narrative on file     No Known Allergies   Outpatient Prescriptions Prior to Visit  Medication Sig Dispense Refill  . aspirin EC 81 MG tablet Take 81 mg by mouth daily.        . budesonide-formoterol (SYMBICORT) 160-4.5 MCG/ACT inhaler Inhale 2 puffs into the lungs 2 (two) times daily.  1 Inhaler  12  . cholecalciferol (VITAMIN D) 1000 UNITS tablet Take 1,000 Units by mouth daily.      . furosemide (LASIX) 40 MG tablet Take 40 mg by mouth every other day. Usually takes on MWF      . guaiFENesin (MUCINEX) 600 MG 12 hr tablet Take 1,200 mg by mouth daily. For congestion      . losartan (COZAAR) 50 MG tablet Take 1 tablet (50 mg total) by mouth daily.  30 tablet  6  . Multiple Vitamins-Minerals (ICAPS MV PO) Take 1 capsule by mouth daily.      . nitroGLYCERIN (NITROSTAT) 0.4 MG SL tablet Place 0.4 mg under the tongue every 5 (five) minutes as needed. Chest pain      . omeprazole (PRILOSEC) 20 MG capsule Take 20 mg by mouth  2 (two) times daily.       . sertraline (ZOLOFT) 100 MG tablet Take 100 mg by mouth daily.       . simvastatin (ZOCOR) 80 MG tablet Take 80 mg by mouth at bedtime.      Marland Kitchen terazosin (HYTRIN) 2 MG capsule Take 2 mg by mouth at bedtime.      . traZODone (DESYREL) 50 MG tablet Take 25 mg by mouth at bedtime.      . [DISCONTINUED] azithromycin (ZITHROMAX) 250 MG tablet Take 1 tablet (250 mg total) by mouth daily. Take two once then one daily until gone  6 each  0  . [DISCONTINUED] predniSONE (DELTASONE) 10 MG tablet Take 4 tablets daily for 5 days then stop  20 tablet  0  . [DISCONTINUED] TOPROL XL 25 MG 24 hr tablet Take 1 tablet (25 mg total) by mouth daily.  30 tablet  30  Last reviewed on 06/30/2012 12:09 PM by Storm Frisk, MD     Review of Systems  Constitutional: Positive for diaphoresis, activity change and fatigue. Negative for chills, appetite change and unexpected weight change.  HENT: Positive for neck stiffness and tinnitus.  Negative for hearing loss, nosebleeds, congestion, facial swelling, sneezing, mouth sores, trouble swallowing, dental problem, voice change, postnasal drip, sinus pressure and ear discharge.   Eyes: Negative for photophobia, discharge, itching and visual disturbance.  Respiratory: Positive for cough. Negative for apnea, choking, chest tightness and stridor.   Cardiovascular: Negative for palpitations.  Gastrointestinal: Positive for nausea. Negative for constipation, blood in stool and abdominal distention.  Genitourinary: Negative for dysuria, urgency, frequency, hematuria, flank pain, decreased urine volume and difficulty urinating.  Musculoskeletal: Positive for back pain and gait problem. Negative for myalgias, joint swelling and arthralgias.  Skin: Negative for color change and pallor.  Neurological: Positive for dizziness, tremors, weakness and light-headedness. Negative for seizures, syncope, speech difficulty and numbness.  Hematological: Negative for adenopathy. Bruises/bleeds easily.  Psychiatric/Behavioral: Positive for sleep disturbance. Negative for confusion and agitation. The patient is nervous/anxious.        Objective:   Physical Exam  Filed Vitals:   06/30/12 1139  BP: 118/70  Pulse: 68  Temp: 98.1 F (36.7 C)  TempSrc: Oral  Height: 5\' 11"  (1.803 m)  Weight: 260 lb (117.935 kg)  SpO2: 98%    Gen: Pleasant, obese, in no distress,  normal affect  ENT: No lesions,  mouth clear,  oropharynx clear, no postnasal drip  Neck: No JVD, no TMG, no carotid bruits  Lungs: No use of accessory muscles, no dullness to percussion, clear breath sounds without wheezes  Cardiovascular: RRR, heart sounds normal, no murmur or gallops, no peripheral edema  Abdomen: soft and NT, no HSM,  BS normal  Musculoskeletal: No deformities, no cyanosis or clubbing  Neuro: alert, non focal  Skin: Warm, no lesions or rashes       Assessment & Plan:   Obstructive chronic bronchitis  with exacerbation Asthmatic bronchitis with very mild peripheral airflow obstruction improved. Poor HFA technique improved to 75% with coaching Level of dyspnea and weakness appears to be out of proportion to the patient's pulmonary function ACE inhibitor was  contributing to upper airway instability and cyclical cough, changed  over to an ARB losartan which improved the cough  Plan pulm rehab referral Work on hfa technique and cont symbicort     Updated Medication List Outpatient Encounter Prescriptions as of 06/30/2012  Medication Sig Dispense Refill  . aspirin EC 81  MG tablet Take 81 mg by mouth daily.        . budesonide-formoterol (SYMBICORT) 160-4.5 MCG/ACT inhaler Inhale 2 puffs into the lungs 2 (two) times daily.  1 Inhaler  12  . cholecalciferol (VITAMIN D) 1000 UNITS tablet Take 1,000 Units by mouth daily.      . furosemide (LASIX) 40 MG tablet Take 40 mg by mouth every other day. Usually takes on MWF      . guaiFENesin (MUCINEX) 600 MG 12 hr tablet Take 1,200 mg by mouth daily. For congestion      . losartan (COZAAR) 50 MG tablet Take 1 tablet (50 mg total) by mouth daily.  30 tablet  6  . Multiple Vitamins-Minerals (ICAPS MV PO) Take 1 capsule by mouth daily.      . nitroGLYCERIN (NITROSTAT) 0.4 MG SL tablet Place 0.4 mg under the tongue every 5 (five) minutes as needed. Chest pain      . omeprazole (PRILOSEC) 20 MG capsule Take 20 mg by mouth 2 (two) times daily.       . sertraline (ZOLOFT) 100 MG tablet Take 100 mg by mouth daily.       . simvastatin (ZOCOR) 80 MG tablet Take 80 mg by mouth at bedtime.      Marland Kitchen terazosin (HYTRIN) 2 MG capsule Take 2 mg by mouth at bedtime.      . traZODone (DESYREL) 50 MG tablet Take 25 mg by mouth at bedtime.      . [DISCONTINUED] azithromycin (ZITHROMAX) 250 MG tablet Take 1 tablet (250 mg total) by mouth daily. Take two once then one daily until gone  6 each  0  . [DISCONTINUED] predniSONE (DELTASONE) 10 MG tablet Take 4 tablets daily for 5  days then stop  20 tablet  0  . [DISCONTINUED] TOPROL XL 25 MG 24 hr tablet Take 1 tablet (25 mg total) by mouth daily.  30 tablet  30

## 2012-06-30 NOTE — Assessment & Plan Note (Addendum)
Asthmatic bronchitis with very mild peripheral airflow obstruction improved. Poor HFA technique improved to 75% with coaching Level of dyspnea and weakness appears to be out of proportion to the patient's pulmonary function ACE inhibitor was  contributing to upper airway instability and cyclical cough, changed  over to an ARB losartan which improved the cough  Plan pulm rehab referral Work on hfa technique and cont symbicort

## 2012-06-30 NOTE — Patient Instructions (Signed)
No change in medications. A referral to pulmonary rehab will be made at [facility] Return in    4 months

## 2012-07-07 ENCOUNTER — Telehealth: Payer: Self-pay | Admitting: Critical Care Medicine

## 2012-07-07 NOTE — Telephone Encounter (Signed)
Spoke to wife they are aware pul rehab will call in 4-5wks and set up consult to get started on rehab [facility]

## 2012-07-21 ENCOUNTER — Telehealth: Payer: Self-pay | Admitting: Critical Care Medicine

## 2012-07-21 NOTE — Telephone Encounter (Signed)
Spoke with pt He states that after thinking about it, pulm rehab at [facility] would be better for him due to being closer to home We already referred him to [facility] though, but he has not heard from them yet [facility]- do I need to place a new order for [facility] rehab or what to do?  Please advise, thanks!

## 2012-07-21 NOTE — Telephone Encounter (Signed)
refaxed referrall to [facility] pul rehab [facility]

## 2012-07-26 ENCOUNTER — Other Ambulatory Visit: Payer: Self-pay | Admitting: Critical Care Medicine

## 2012-08-03 ENCOUNTER — Telehealth: Payer: Self-pay | Admitting: Critical Care Medicine

## 2012-08-03 NOTE — Telephone Encounter (Signed)
Bryan Hatfield returned call.  States that their referral form is what she is looking for, not the order for the referral.  [name]

## 2012-08-03 NOTE — Telephone Encounter (Signed)
Libby, will you pls send this information pulm rehab at [facility] on their referral form?  [email or web address] to inform [facility] will send this to them.

## 2012-08-03 NOTE — Telephone Encounter (Signed)
We did receive this fax. However, on 07/26/12, order was placed for pulmonary rehab to change the dx to dyspnea as we were told they would cover it for dyspnea.  Is dyspnea covered as a dx?  Did they receive the updated order?    [email or web address] for [facility]

## 2012-08-03 NOTE — Telephone Encounter (Signed)
ERROR. MSG NOT NEEDED

## 2012-08-03 NOTE — Telephone Encounter (Signed)
refaxed referral form (3rd) time to [facility]

## 2012-09-06 ENCOUNTER — Other Ambulatory Visit: Payer: Self-pay | Admitting: Gastroenterology

## 2012-09-06 DIAGNOSIS — R110 Nausea: Secondary | ICD-10-CM

## 2012-09-09 ENCOUNTER — Ambulatory Visit
Admission: RE | Admit: 2012-09-09 | Discharge: 2012-09-09 | Disposition: A | Discharge status: Home / Self Care | Payer: Medicare Other | Source: Ambulatory Visit | Attending: Gastroenterology | Admitting: Gastroenterology

## 2012-09-09 DIAGNOSIS — R110 Nausea: Secondary | ICD-10-CM

## 2012-09-12 ENCOUNTER — Telehealth: Payer: Self-pay | Admitting: Critical Care Medicine

## 2012-09-12 NOTE — Telephone Encounter (Signed)
I LMTCBx1 with Pulmonary Rehab at [facility] at [phone] to check on the status of the referral. Pt is aware. Carron Curie, CMA

## 2012-09-13 NOTE — Telephone Encounter (Signed)
lmomtcb x2 for jasmine

## 2012-09-13 NOTE — Telephone Encounter (Signed)
Spoke with Talbert Forest , pt's daughter and informed of Jasmine's attempt to contact pt and she said that they have received the letter from them and have fixed their answering machine and they will wait to hear back from [facility] pulm re[facility] regarding starting pulm rehab.

## 2012-09-13 NOTE — Telephone Encounter (Signed)
Jasmine returned triage's call.  States that she has made several attempts to reach the pt.  Pt has not set up voice mail, so she mailed a letter to the pt to have him contact their office to set this up.  States that she has sent a request to pt's insurance to see what they will cover & if there will be a cost to pt.  [name]

## 2012-09-19 ENCOUNTER — Telehealth: Payer: Self-pay | Admitting: Critical Care Medicine

## 2012-09-19 NOTE — Telephone Encounter (Signed)
I will forward this message to Dr. Delford Field so that he will be aware of this information.

## 2012-09-19 NOTE — Telephone Encounter (Signed)
noted 

## 2012-09-20 NOTE — Telephone Encounter (Signed)
Will close msg as Dr. Delford Field is aware.

## 2012-09-23 ENCOUNTER — Ambulatory Visit (INDEPENDENT_AMBULATORY_CARE_PROVIDER_SITE_OTHER): Payer: Medicare Other | Admitting: General Surgery

## 2012-09-23 ENCOUNTER — Encounter (INDEPENDENT_AMBULATORY_CARE_PROVIDER_SITE_OTHER): Payer: Self-pay | Admitting: General Surgery

## 2012-09-23 VITALS — BP 140/72 | HR 64 | Temp 97.1°F | Resp 16 | Ht 71.0 in | Wt 252.4 lb

## 2012-09-23 DIAGNOSIS — K8000 Calculus of gallbladder with acute cholecystitis without obstruction: Secondary | ICD-10-CM

## 2012-09-23 NOTE — Progress Notes (Addendum)
Patient ID: Bryan Hatfield, male   DOB: 1927/01/08, 79 y.o.   MRN: [id number]  Chief Complaint  Patient presents with  . New Evaluation    eval GB w/ stones    HPI Bryan Hatfield is a 79 y.o. male.  He is referred by Dr. Carman Ching because of recent onset nausea, a vague pain, and gallstones. Dr. Verdis Prime is his cardiologist. Dr. Danise Mina is his pulmonologist. Dr. Marjory Lies is his primary care physician.  This patient was hospitalized in December of 2013 with weakness, nausea, diaphoresis and vague chest pain. A CT angiogram of the chest showed no pulmonary embolus. A gallstone was noted but the gallbladder really didn't look all that abnormal. Nuclear medicine test suggested some ischemia, but possibly this was a false positive. Dr. Katrinka Blazing performed a cardiac catheterization on December 31, coronary the patient, and he was told this was unremarkable.  He recently saw Dr. Randa Evens and reported early satiety, a 2 month history of nausea which is a new problem for him, minimal but admittedly upper abdominal discomfort. Liver function tests, CBC, lipase are normal. No change in his bowel habits.  Ultrasound showed irregular thickening of gallbladder wall, gallstones, common bile duct 2.4 mm. Differential diagnosis according to the radiologist was chronic cholecystitis versus gallbladder carcinoma. MRCP or CT was suggested.  The patient is asymptomatic today. He is here with his wife.  Comorbidities include BMI of 35, COPD, sleep apnea, hypertension, coronary disease and a stent placed in [facility] with normal LV function, hyperlipidemia, and GERD. He takes aspirin.  HPI  Past Medical History  Diagnosis Date  . CAD (coronary artery disease)     a. PTCA 2001. b. f/u cath 2008: mild nonobstructive CAD.  Marland Kitchen Hypertension   . Acute respiratory failure 09/25/11    hypoxic  . Peripheral vascular disease     patient denies knowledge of, but listed in his chart  . Sleep apnea   . High  cholesterol   . Anxiety   . Pleural effusion     Parapneumonic ?r/t PNA s/p thoracentesis 2008  . GERD (gastroesophageal reflux disease)   . Colon polyps     Past Surgical History  Procedure Laterality Date  . Hemorrhoid surgery  1960's  . Coronary angioplasty  01/2000  . Cataract extraction w/ intraocular lens  implant, bilateral  ~ 2006    Family History  Problem Relation Age of Onset  . Stroke      Mother and father  . Arthritis Mother   . Asthma Mother   . Emphysema Mother   . Arthritis Sister   . Stroke Sister     Social History History  Substance Use Topics  . Smoking status: Former Smoker -- 0.50 packs/day for 10 years    Types: Cigarettes    Quit date: 06/01/1986  . Smokeless tobacco: Never Used     Comment: "quit smoking ~ 25 years ago; smoked ~ 1 pk/wk"  . Alcohol Use: No    No Known Allergies  Current Outpatient Prescriptions  Medication Sig Dispense Refill  . amoxicillin-clavulanate (AUGMENTIN) 875-125 MG per tablet       . aspirin EC 81 MG tablet Take 81 mg by mouth daily.        . cholecalciferol (VITAMIN D) 1000 UNITS tablet Take 1,000 Units by mouth daily.      . furosemide (LASIX) 40 MG tablet Take 40 mg by mouth every other day. Usually takes on MWF      .  losartan (COZAAR) 50 MG tablet Take 1 tablet (50 mg total) by mouth daily.  30 tablet  6  . Multiple Vitamins-Minerals (ICAPS MV PO) Take 1 capsule by mouth daily.      . nitroGLYCERIN (NITROSTAT) 0.4 MG SL tablet Place 0.4 mg under the tongue every 5 (five) minutes as needed. Chest pain      . omeprazole (PRILOSEC) 20 MG capsule Take 20 mg by mouth 2 (two) times daily.       . sertraline (ZOLOFT) 100 MG tablet Take 100 mg by mouth daily.       Marland Kitchen terazosin (HYTRIN) 2 MG capsule Take 2 mg by mouth at bedtime.      . traZODone (DESYREL) 50 MG tablet Take 25 mg by mouth at bedtime.      . triamcinolone cream (KENALOG) 0.1 %       . guaiFENesin (MUCINEX) 600 MG 12 hr tablet Take 1,200 mg by mouth  daily. For congestion       No current facility-administered medications for this visit.    Review of Systems Review of Systems  Constitutional: Negative for fever, chills and unexpected weight change.  HENT: Negative for hearing loss, congestion, sore throat, trouble swallowing and voice change.   Eyes: Negative for visual disturbance.  Respiratory: Positive for apnea and shortness of breath. Negative for cough and wheezing.   Cardiovascular: Positive for chest pain. Negative for palpitations and leg swelling.  Gastrointestinal: Positive for nausea and abdominal pain. Negative for vomiting, diarrhea, constipation, blood in stool, abdominal distention, anal bleeding and rectal pain.  Genitourinary: Negative for hematuria and difficulty urinating.  Musculoskeletal: Negative for arthralgias.  Skin: Negative for rash and wound.  Neurological: Negative for seizures, syncope, weakness and headaches.  Hematological: Negative for adenopathy. Does not bruise/bleed easily.  Psychiatric/Behavioral: Negative for confusion.    Blood pressure 140/72, pulse 64, temperature 97.1 F (36.2 C), temperature source Temporal, resp. rate 16, height 5\' 11"  (1.803 m), weight 252 lb 6.4 oz (114.488 kg).  Physical Exam Physical Exam  Constitutional: He is oriented to person, place, and time. He appears well-developed and well-nourished. No distress.  BMI 35.  HENT:  Head: Normocephalic.  Nose: Nose normal.  Mouth/Throat: No oropharyngeal exudate.  Eyes: Conjunctivae and EOM are normal. Pupils are equal, round, and reactive to light. Right eye exhibits no discharge. Left eye exhibits no discharge. No scleral icterus.  Neck: Normal range of motion. Neck supple. No JVD present. No tracheal deviation present. No thyromegaly present.  Cardiovascular: Normal rate, regular rhythm, normal heart sounds and intact distal pulses.   No murmur heard. Pulmonary/Chest: Effort normal and breath sounds normal. No stridor.  No respiratory distress. He has no wheezes. He has no rales. He exhibits no tenderness.  Abdominal: Soft. Bowel sounds are normal. He exhibits no distension and no mass. There is no tenderness. There is no rebound and no guarding.  Subjective right upper quadrant tenderness to deep palpation. Objectively no abnormalities on exam. No mass.  Musculoskeletal: Normal range of motion. He exhibits no edema and no tenderness.  Lymphadenopathy:    He has no cervical adenopathy.  Neurological: He is alert and oriented to person, place, and time. He has normal reflexes. Coordination normal.  Skin: Skin is warm and dry. No rash noted. He is not diaphoretic. No erythema. No pallor.  Psychiatric: He has a normal mood and affect. His behavior is normal. Judgment and thought content normal.    Data Reviewed Hospital records. Imaging studies. Notes  from [facility].  Assessment    Two-month history nausea, early satiety, vague upper abdominal pain. It is very likely that biliary tract disease is the cause of his symptoms, and I suspect we will need to proceed with cholecystectomy.  Abnormal appearance of the gallbladder home ultrasound suggest me for further imaging to assess the possibility of neoplasia.  Coronary artery disease, followed by Dr. Katrinka Blazing  COPD and sleep apnea, followed by Dr. Delford Field Addendum(09/27/2012): Dr. Delford Field sent note stating that he thinks patient will do well with cholecystectomy and he does not need to see him pre op.  Obesity  Hypertension  Hyperlipidemia  GERD.     Plan    I had a long talk with the patient and his wife about the differential diagnosis and  that eventually he will need to have his gallbladder removed.  We will schedule him for MRI of the abdomen to evaluate the gallbladder. If he cannot hold his breath and will change that to a CT scan. Would also get a CCK stimulated hepatobiliary scan Addendum (09/26/2012):  MRI and MRCP showed gallstones and fundal  thickening. 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 There is no evidence of cancer. CBD not dilated. We called Bryan Hatfield and  offered to see him in the office to discuss these findings again. We also offered to proceed with gallbladder surgery. It was his preference to proceed with scheduling of  gallbladder surgery, once his cardiac clearance with Dr. Katrinka Blazing is completed. We went ahead and put his orders him for this surgery, pending cardiac clearance.  We will ask Dr. Katrinka Blazing for cardiac clearance for cholecystectomy  Personal return to see me after the imaging studies for final decision making and probable scheduling of gallbladder surgery.  I did go over the details of laparoscopic cholecystectomy with patient and his wife. Patient information booklets were given. Risks were outlined. He understands all these issues.        Angelia Mould. Derrell Lolling, M.D., [facility] General and Minimally invasive Surgery Breast and Colorectal Surgery Office:   [phone] Pager:   [phone]  09/23/2012, 10:19 AM

## 2012-09-23 NOTE — Patient Instructions (Signed)
You have gallstones and irregular thickening of the gallbladder wall. I suspect that this is the cause of your nausea and upper abdominal discomfort.  Because of the unusual appearance of the gallbladder on the ultrasound, we are going to do some other x-ray tests which will be scheduled.  We will also ask Dr. Katrinka Blazing for cardiac clearance for gallbladder surgery.  Return to see Dr. Derrell Lolling after the x-ray tests are done, and we will discuss gallbladder surgery and probably go ahead and schedule it at that time.  Stick to a very low-fat diet.      Laparoscopic Cholecystectomy Laparoscopic cholecystectomy is surgery to remove the gallbladder. The gallbladder is located slightly to the right of center in the abdomen, behind the liver. It is a concentrating and storage sac for the bile produced in the liver. Bile aids in the digestion and absorption of fats. Gallbladder disease (cholecystitis) is an inflammation of your gallbladder. This condition is usually caused by a buildup of gallstones (cholelithiasis) in your gallbladder. Gallstones can block the flow of bile, resulting in inflammation and pain. In severe cases, emergency surgery may be required. When emergency surgery is not required, you will have time to prepare for the procedure. Laparoscopic surgery is an alternative to open surgery. Laparoscopic surgery usually has a shorter recovery time. Your common bile duct may also need to be examined and explored. Your caregiver will discuss this with you if he or she feels this should be done. If stones are found in the common bile duct, they may be removed. LET YOUR CAREGIVER KNOW ABOUT:  Allergies to food or medicine.  Medicines taken, including vitamins, herbs, eyedrops, over-the-counter medicines, and creams.  Use of steroids (by mouth or creams).  Previous problems with anesthetics or numbing medicines.  History of bleeding problems or blood clots.  Previous surgery.  Other health  problems, including diabetes and kidney problems.  Possibility of pregnancy, if this applies. RISKS AND COMPLICATIONS All surgery is associated with risks. Some problems that may occur following this procedure include:  Infection.  Damage to the common bile duct, nerves, arteries, veins, or other internal organs such as the stomach or intestines.  Bleeding.  A stone may remain in the common bile duct. BEFORE THE PROCEDURE  Do not take aspirin for 3 days prior to surgery or blood thinners for 1 week prior to surgery.  Do not eat or drink anything after midnight the night before surgery.  Let your caregiver know if you develop a cold or other infectious problem prior to surgery.  You should be present 60 minutes before the procedure or as directed. PROCEDURE  You will be given medicine that makes you sleep (general anesthetic). When you are asleep, your surgeon will make several small cuts (incisions) in your abdomen. One of these incisions is used to insert a small, lighted scope (laparoscope) into the abdomen. The laparoscope helps the surgeon see into your abdomen. Carbon dioxide gas will be pumped into your abdomen. The gas allows more room for the surgeon to perform your surgery. Other operating instruments are inserted through the other incisions. Laparoscopic procedures may not be appropriate when:  There is major scarring from previous surgery.  The gallbladder is extremely inflamed.  There are bleeding disorders or unexpected cirrhosis of the liver.  A pregnancy is near term.  Other conditions make the laparoscopic procedure impossible. If your surgeon feels it is not safe to continue with a laparoscopic procedure, he or she will perform an open  abdominal procedure. In this case, the surgeon will make an incision to open the abdomen. This gives the surgeon a larger view and field to work within. This may allow the surgeon to perform procedures that sometimes cannot be  performed with a laparoscope alone. Open surgery has a longer recovery time. AFTER THE PROCEDURE  You will be taken to the recovery area where a nurse will watch and check your progress.  You may be allowed to go home the same day.  Do not resume physical activities until directed by your caregiver.  You may resume a normal diet and activities as directed. Document Released: 05/18/2005 Document Revised: 08/10/2011 Document Reviewed: 10/31/2010 [facility] Patient Information 2013 [place], [place].

## 2012-09-26 ENCOUNTER — Telehealth (INDEPENDENT_AMBULATORY_CARE_PROVIDER_SITE_OTHER): Payer: Self-pay

## 2012-09-26 ENCOUNTER — Encounter: Payer: Self-pay | Admitting: Critical Care Medicine

## 2012-09-26 ENCOUNTER — Ambulatory Visit (HOSPITAL_COMMUNITY)
Admission: RE | Admit: 2012-09-26 | Discharge: 2012-09-26 | Disposition: A | Discharge status: Home / Self Care | Payer: Medicare Other | Source: Ambulatory Visit | Attending: General Surgery | Admitting: General Surgery

## 2012-09-26 ENCOUNTER — Encounter (HOSPITAL_COMMUNITY)
Admission: RE | Admit: 2012-09-26 | Discharge: 2012-09-26 | Disposition: A | Discharge status: Home / Self Care | Payer: Medicare Other | Source: Ambulatory Visit | Attending: General Surgery | Admitting: General Surgery

## 2012-09-26 ENCOUNTER — Encounter (HOSPITAL_COMMUNITY): Payer: Self-pay

## 2012-09-26 ENCOUNTER — Other Ambulatory Visit (INDEPENDENT_AMBULATORY_CARE_PROVIDER_SITE_OTHER): Payer: Self-pay | Admitting: General Surgery

## 2012-09-26 DIAGNOSIS — K8020 Calculus of gallbladder without cholecystitis without obstruction: Secondary | ICD-10-CM | POA: Insufficient documentation

## 2012-09-26 DIAGNOSIS — K8000 Calculus of gallbladder with acute cholecystitis without obstruction: Secondary | ICD-10-CM | POA: Insufficient documentation

## 2012-09-26 DIAGNOSIS — I517 Cardiomegaly: Secondary | ICD-10-CM | POA: Insufficient documentation

## 2012-09-26 DIAGNOSIS — I10 Essential (primary) hypertension: Secondary | ICD-10-CM | POA: Insufficient documentation

## 2012-09-26 DIAGNOSIS — I2510 Atherosclerotic heart disease of native coronary artery without angina pectoris: Secondary | ICD-10-CM | POA: Insufficient documentation

## 2012-09-26 DIAGNOSIS — K449 Diaphragmatic hernia without obstruction or gangrene: Secondary | ICD-10-CM | POA: Insufficient documentation

## 2012-09-26 DIAGNOSIS — K5710 Diverticulosis of small intestine without perforation or abscess without bleeding: Secondary | ICD-10-CM | POA: Insufficient documentation

## 2012-09-26 DIAGNOSIS — R109 Unspecified abdominal pain: Secondary | ICD-10-CM | POA: Insufficient documentation

## 2012-09-26 DIAGNOSIS — J9600 Acute respiratory failure, unspecified whether with hypoxia or hypercapnia: Secondary | ICD-10-CM | POA: Insufficient documentation

## 2012-09-26 DIAGNOSIS — N289 Disorder of kidney and ureter, unspecified: Secondary | ICD-10-CM | POA: Insufficient documentation

## 2012-09-26 DIAGNOSIS — N281 Cyst of kidney, acquired: Secondary | ICD-10-CM | POA: Insufficient documentation

## 2012-09-26 MED ORDER — GADOBENATE DIMEGLUMINE 529 MG/ML IV SOLN
20.0000 mL | Freq: Once | INTRAVENOUS | Status: AC | PRN
  Administered 2012-09-26: 20 mL via INTRAVENOUS

## 2012-09-26 MED ORDER — TECHNETIUM TC 99M MEBROFENIN IV KIT
5.3000 | PACK | Freq: Once | INTRAVENOUS | Status: AC | PRN
  Administered 2012-09-26: 5.3 via INTRAVENOUS

## 2012-09-26 NOTE — Telephone Encounter (Signed)
I called the pt and let him know the message below.  He wants to go ahead and schedule.  I told him we just need Dr Michaelle Copas clearance and then we can set him up.

## 2012-09-26 NOTE — Telephone Encounter (Signed)
Message copied by Ivory Broad on Mon Sep 26, 2012  4:54 PM ------      Message from: [name]      Created: Mon Sep 26, 2012  4:42 PM       This patient's MRI does not show any evidence of cancer. It just shows gallstones, which we knew about and also shows signs of gallbladder polyps. In my opinion, it would be appropriate to offer him a cholecystectomy as the next step. He may return to the office to discuss this further along with his family members. If he chooses to, we may simply schedule the surgery. Just let me know.            hmi            ----- Message -----         From: Rad Results In Interface         Sent: 09/26/2012   3:18 PM           To: [name], MD                   ------

## 2012-09-26 NOTE — Telephone Encounter (Signed)
I was told the pt wants to be seen this week with Dr Derrell Lolling after his tests today.  He does not have any openings at this time.  I left a message for the pt to call so I can check if he is having any worsening symptoms.  We are also awaiting cardiac clearance.  We need all the information back in order to prepare for surgery.  I want to offer him an appointment on May 12th when Dr Derrell Lolling is back.

## 2012-09-26 NOTE — Telephone Encounter (Signed)
I called the pt back.  He is having no worse symptoms.  He is the same.  He agrees to an appointment on the 12 th at 4 pm.  I told him I will call him if I get a cancellation this week.

## 2012-09-30 ENCOUNTER — Encounter (HOSPITAL_COMMUNITY): Payer: Self-pay | Admitting: Pharmacy Technician

## 2012-09-30 ENCOUNTER — Encounter (INDEPENDENT_AMBULATORY_CARE_PROVIDER_SITE_OTHER): Payer: Self-pay

## 2012-10-03 ENCOUNTER — Encounter (HOSPITAL_COMMUNITY)
Admission: RE | Admit: 2012-10-03 | Discharge: 2012-10-03 | Disposition: A | Discharge status: Home / Self Care | Payer: Medicare Other | Source: Ambulatory Visit | Attending: General Surgery | Admitting: General Surgery

## 2012-10-03 ENCOUNTER — Encounter (HOSPITAL_COMMUNITY): Payer: Self-pay

## 2012-10-03 ENCOUNTER — Encounter (HOSPITAL_COMMUNITY): Admission: RE | Admit: 2012-10-03 | Payer: Medicare Other | Source: Ambulatory Visit

## 2012-10-03 DIAGNOSIS — Z01812 Encounter for preprocedural laboratory examination: Secondary | ICD-10-CM | POA: Insufficient documentation

## 2012-10-03 HISTORY — DX: Chronic obstructive pulmonary disease, unspecified: J44.9

## 2012-10-03 HISTORY — DX: Pneumonia, unspecified organism: J18.9

## 2012-10-03 LAB — URINALYSIS, ROUTINE W REFLEX MICROSCOPIC
Bilirubin Urine: NEGATIVE
Glucose, UA: NEGATIVE mg/dL
Hgb urine dipstick: NEGATIVE
Nitrite: NEGATIVE
Specific Gravity, Urine: 1.016 (ref 1.005–1.030)
pH: 6.5 (ref 5.0–8.0)

## 2012-10-03 LAB — URINE MICROSCOPIC-ADD ON

## 2012-10-03 LAB — COMPREHENSIVE METABOLIC PANEL
ALT: 20 U/L (ref 0–53)
AST: 30 U/L (ref 0–37)
Albumin: 3.6 g/dL (ref 3.5–5.2)
Alkaline Phosphatase: 84 U/L (ref 39–117)
Calcium: 9.2 mg/dL (ref 8.4–10.5)
GFR calc Af Amer: 90 mL/min — ABNORMAL LOW (ref >90)
Potassium: 4.3 mEq/L (ref 3.5–5.1)
Sodium: 140 mEq/L (ref 135–145)
Total Protein: 7.1 g/dL (ref 6.0–8.3)

## 2012-10-03 LAB — CBC WITH DIFFERENTIAL/PLATELET
Basophils Absolute: 0 10*3/uL (ref 0.0–0.1)
Basophils Relative: 0 % (ref 0–1)
Eosinophils Absolute: 0.3 10*3/uL (ref 0.0–0.7)
Eosinophils Relative: 4 % (ref 0–5)
MCH: 30.1 pg (ref 26.0–34.0)
MCV: 90.1 fL (ref 78.0–100.0)
Neutrophils Relative %: 64 % (ref 43–77)
Platelets: 167 10*3/uL (ref 150–400)
RBC: 4.25 MIL/uL (ref 4.22–5.81)
RDW: 13.5 % (ref 11.5–15.5)

## 2012-10-03 LAB — SURGICAL PCR SCREEN: Staphylococcus aureus: NEGATIVE

## 2012-10-03 NOTE — Pre-Procedure Instructions (Addendum)
Bryan Hatfield  10/03/2012   Your procedure is scheduled on: 10/11/12  Report to [facility] at 530 AM.  Call this number if you have problems the morning of surgery: [phone]   Remember:   Do not eat food or drink liquids after midnight.   Take these medicines the morning of surgery with A SIP OF WATER:omeprazole, zoloft, hytrin, nitro if needed  STOP aspirin 10/06/12   Do not wear jewelry, make-up or nail polish.  Do not wear lotions, powders, or perfumes. You may wear deodorant.  Do not shave 48 hours prior to surgery. Men may shave face and neck.  Do not bring valuables to the hospital.  Contacts, dentures or bridgework may not be worn into surgery.  Leave suitcase in the car. After surgery it may be brought to your room.  For patients admitted to the hospital, checkout time is 11:00 AM the day of  discharge.   Patients discharged the day of surgery will not be allowed to drive  home.  Name and phone number of your driver:  Special Instructions: Shower using CHG 2 nights before surgery and the night before surgery.  If you shower the day of surgery use CHG.  Use special wash - you have one bottle of CHG for all showers.  You should use approximately 1/3 of the bottle for each shower.   Please read over the following fact sheets that you were given: Pain Booklet, Coughing and Deep Breathing, MRSA Information and Surgical Site Infection Prevention

## 2012-10-03 NOTE — Progress Notes (Signed)
Stress 12/13,ekg 12.13 cxr 12.13 echo 2.08 clearance note dr Katrinka Blazing 5.14

## 2012-10-06 ENCOUNTER — Encounter: Payer: Self-pay | Admitting: Critical Care Medicine

## 2012-10-06 ENCOUNTER — Ambulatory Visit (INDEPENDENT_AMBULATORY_CARE_PROVIDER_SITE_OTHER): Payer: Medicare Other | Admitting: Critical Care Medicine

## 2012-10-06 VITALS — BP 104/60 | HR 84 | Temp 98.4°F | Ht 71.0 in | Wt 250.0 lb

## 2012-10-06 DIAGNOSIS — J449 Chronic obstructive pulmonary disease, unspecified: Secondary | ICD-10-CM

## 2012-10-06 DIAGNOSIS — J4489 Other specified chronic obstructive pulmonary disease: Secondary | ICD-10-CM

## 2012-10-06 MED ORDER — BUDESONIDE-FORMOTEROL FUMARATE 160-4.5 MCG/ACT IN AERO: 2.0000 | INHALATION_SPRAY | Freq: Two times a day (BID) | RESPIRATORY_TRACT | Status: DC

## 2012-10-06 NOTE — Assessment & Plan Note (Signed)
Gold A copd with recurrent asthmatic bronchitis improved at this time Pt needs to stay on laba/ics preop Pt is cleared medically for planned Gallbladder surgery Resume symbicort

## 2012-10-06 NOTE — Patient Instructions (Addendum)
REsume Symbicort two puff twice daily You are cleared for gallbadder surgery Return 4 months

## 2012-10-06 NOTE — Progress Notes (Signed)
Subjective:    Patient ID: Bryan Hatfield, male    DOB: 21-Mar-1927, 79 y.o.   MRN: [id number]  HPI  10/06/2012 Stopped symbicort 3 weeks ago and did not notice a difference.  No acute c/o dyspnea or chest pain Pt denies any significant sore throat, nasal congestion or excess secretions, fever, chills, sweats, unintended weight loss, pleurtic or exertional chest pain, orthopnea PND, or leg swelling Pt denies any increase in rescue therapy over baseline, denies waking up needing it or having any early am or nocturnal exacerbations of coughing/wheezing/or dyspnea. Pt also denies any obvious fluctuation in symptoms with  weather or environmental change or other alleviating or aggravating factors  Needs clearance for planned GB surgery  Past Medical History  Diagnosis Date  . CAD (coronary artery disease)     a. PTCA 2001. b. f/u cath 2008: mild nonobstructive CAD.  Marland Kitchen Hypertension   . Acute respiratory failure 09/25/11    hypoxic  . Peripheral vascular disease     patient denies knowledge of, but listed in his chart  . High cholesterol   . Anxiety   . Pleural effusion     Parapneumonic ?r/t PNA s/p thoracentesis 2008  . GERD (gastroesophageal reflux disease)   . Colon polyps   . COPD (chronic obstructive pulmonary disease)   . Pneumonia     hx  . Sleep apnea     cpap      Family History  Problem Relation Age of Onset  . Stroke      Mother and father  . Arthritis Mother   . Asthma Mother   . Emphysema Mother   . Arthritis Sister   . Stroke Sister      History   Social History  . Marital Status: Married    Spouse Name: N/A    Number of Children: N/A  . Years of Education: N/A   Occupational History  . 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 Smoking status: Former Smoker -- 1.00 packs/day for 10 years    Types: Cigarettes    Quit date: 06/01/1986  . Smokeless tobacco: Never Used     Comment: "quit smoking ~ 25 years ago; smoked ~ 1  pk/wk"  . Alcohol Use: No  . Drug Use: No  . Sexually Active: No   Other Topics Concern  . Not on file   Social History Narrative  . No narrative on file     No Known Allergies   Outpatient Prescriptions Prior to Visit  Medication Sig Dispense Refill  . cholecalciferol (VITAMIN D) 1000 UNITS tablet Take 1,000 Units by mouth daily.      . furosemide (LASIX) 40 MG tablet Take 40 mg by mouth every other day. Usually takes on MWF      . losartan (COZAAR) 50 MG tablet Take 1 tablet (50 mg total) by mouth daily.  30 tablet  6  . Multiple Vitamins-Minerals (ICAPS MV PO) Take 1 capsule by mouth daily.      . nitroGLYCERIN (NITROSTAT) 0.4 MG SL tablet Place 0.4 mg under the tongue every 5 (five) minutes as needed for chest pain.       Marland Kitchen omeprazole (PRILOSEC) 20 MG capsule Take 20 mg by mouth 2 (two) times daily.       . sertraline (ZOLOFT) 100 MG tablet Take 100 mg by mouth daily.       Marland Kitchen terazosin (HYTRIN) 2 MG capsule Take  2 mg by mouth at bedtime.      . traZODone (DESYREL) 50 MG tablet Take 25 mg by mouth at bedtime.      Marland Kitchen aspirin EC 81 MG tablet Take 81 mg by mouth daily.         No facility-administered medications prior to visit.       Review of Systems  Constitutional: Positive for diaphoresis, activity change and fatigue. Negative for chills, appetite change and unexpected weight change.  HENT: Positive for neck stiffness and tinnitus. Negative for hearing loss, nosebleeds, congestion, facial swelling, sneezing, mouth sores, trouble swallowing, dental problem, voice change, postnasal drip, sinus pressure and ear discharge.   Eyes: Negative for photophobia, discharge, itching and visual disturbance.  Respiratory: Positive for cough. Negative for apnea, choking, chest tightness and stridor.   Cardiovascular: Negative for palpitations.  Gastrointestinal: Positive for nausea. Negative for constipation, blood in stool and abdominal distention.  Genitourinary: Negative for dysuria,  urgency, frequency, hematuria, flank pain, decreased urine volume and difficulty urinating.  Musculoskeletal: Positive for back pain and gait problem. Negative for myalgias, joint swelling and arthralgias.  Skin: Negative for color change and pallor.  Neurological: Positive for dizziness, tremors, weakness and light-headedness. Negative for seizures, syncope, speech difficulty and numbness.  Hematological: Negative for adenopathy. Bruises/bleeds easily.  Psychiatric/Behavioral: Positive for sleep disturbance. Negative for confusion and agitation. The patient is nervous/anxious.        Objective:   Physical Exam  Filed Vitals:   10/06/12 1203  BP: 104/60  Pulse: 84  Temp: 98.4 F (36.9 C)  TempSrc: Oral  Height: 5\' 11"  (1.803 m)  Weight: 250 lb (113.399 kg)  SpO2: 94%    Gen: Pleasant, obese, in no distress,  normal affect  ENT: No lesions,  mouth clear,  oropharynx clear, no postnasal drip  Neck: No JVD, no TMG, no carotid bruits  Lungs: No use of accessory muscles, no dullness to percussion, clear breath sounds without wheezes  Cardiovascular: RRR, heart sounds normal, no murmur or gallops, no peripheral edema  Abdomen: soft and NT, no HSM,  BS normal  Musculoskeletal: No deformities, no cyanosis or clubbing  Neuro: alert, non focal  Skin: Warm, no lesions or rashes       Assessment & Plan:   COPD golds A   with recurrent tracheobronchitis and asthmatic bronchitic component Gold A copd with recurrent asthmatic bronchitis improved at this time Pt needs to stay on laba/ics preop Pt is cleared medically for planned Gallbladder surgery Resume symbicort     Updated Medication List Outpatient Encounter Prescriptions as of 10/06/2012  Medication Sig Dispense Refill  . cholecalciferol (VITAMIN D) 1000 UNITS tablet Take 1,000 Units by mouth daily.      . furosemide (LASIX) 40 MG tablet Take 40 mg by mouth every other day. Usually takes on MWF      . losartan (COZAAR)  50 MG tablet Take 1 tablet (50 mg total) by mouth daily.  30 tablet  6  . Multiple Vitamins-Minerals (ICAPS MV PO) Take 1 capsule by mouth daily.      . nitroGLYCERIN (NITROSTAT) 0.4 MG SL tablet Place 0.4 mg under the tongue every 5 (five) minutes as needed for chest pain.       Marland Kitchen omeprazole (PRILOSEC) 20 MG capsule Take 20 mg by mouth 2 (two) times daily.       . sertraline (ZOLOFT) 100 MG tablet Take 100 mg by mouth daily.       . simvastatin (ZOCOR)  80 MG tablet Take one tablet every evening      . terazosin (HYTRIN) 2 MG capsule Take 2 mg by mouth at bedtime.      . traZODone (DESYREL) 50 MG tablet Take 25 mg by mouth at bedtime.      Marland Kitchen aspirin EC 81 MG tablet Take 81 mg by mouth daily.        . budesonide-formoterol (SYMBICORT) 160-4.5 MCG/ACT inhaler Inhale 2 puffs into the lungs 2 (two) times daily.  1 Inhaler  12   No facility-administered encounter medications on file as of 10/06/2012.

## 2012-10-10 ENCOUNTER — Ambulatory Visit (INDEPENDENT_AMBULATORY_CARE_PROVIDER_SITE_OTHER): Payer: Medicare Other | Admitting: General Surgery

## 2012-10-10 MED ORDER — CEFAZOLIN SODIUM-DEXTROSE 2-3 GM-% IV SOLR: 2.0000 g | INTRAVENOUS | Status: DC

## 2012-10-10 NOTE — H&P (Signed)
Bryan Hatfield   MRN:  [id number]   Description: 79 year old male  Provider: [name], MD  Department: [facility]      Diagnoses    Gallstones and inflammation of gallbladder without obstruction    -  Primary    574.00        Current Vitals - Last Recorded    BP Pulse Temp(Src) Resp Ht Wt    140/72 64 97.1 F (36.2 C) (Temporal) 16 5\' 11"  (1.803 m) 252 lb 6.4 oz (114.488 kg)    BMI - 35.22 kg/m2                 History and Physical   [name], MD     Status: Addendum                            HPI Bryan Hatfield is a 79 y.o. male.  He is referred by Dr. Carman Ching because of recent onset nausea, a vague pain, and gallstones. Dr. Verdis Prime is his cardiologist. Dr. Danise Mina is his pulmonologist. Dr. Marjory Lies is his primary care physician.   This patient was hospitalized in December of 2013 with weakness, nausea, diaphoresis and vague chest pain. A CT angiogram of the chest showed no pulmonary embolus. A gallstone was noted but the gallbladder really didn't look all that abnormal. Nuclear medicine test suggested some ischemia, but possibly this was a false positive. Dr. Katrinka Blazing performed a cardiac catheterization on December 31, coronary the patient, and he was told this was unremarkable.   He recently saw Dr. Randa Evens and reported early satiety, a 2 month history of nausea which is a new problem for him, minimal but admittedly upper abdominal discomfort. Liver function tests, CBC, lipase are normal. No change in his bowel habits.   Ultrasound showed irregular thickening of gallbladder wall, gallstones, common bile duct 2.4 mm. Differential diagnosis according to the radiologist was chronic cholecystitis versus gallbladder carcinoma. MRCP or CT was suggested.   The patient is asymptomatic today. He is here with his wife.   Comorbidities include BMI of 35, COPD, sleep apnea, hypertension, coronary disease and a stent placed in [facility] with normal LV function, hyperlipidemia, and GERD. He takes aspirin.         Past Medical History   Diagnosis  Date   .  CAD (coronary artery disease)         a. PTCA 2001. b. f/u cath 2008: mild nonobstructive CAD.   Marland Kitchen  Hypertension     .  Acute respiratory failure  09/25/11       hypoxic   .  Peripheral vascular disease         patient denies knowledge of, but listed in his chart   .  Sleep apnea     .  High cholesterol     .  Anxiety     .  Pleural effusion         Parapneumonic ?r/t PNA s/p thoracentesis 2008   .  GERD (gastroesophageal reflux disease)     .  Colon polyps           Past Surgical History   Procedure  Laterality  Date   .  Hemorrhoid surgery    1960's   .  Coronary angioplasty    01/2000   .  Cataract extraction w/ intraocular lens  implant, bilateral    ~  2006         Family History   Problem  Relation  Age of Onset   .  Stroke           Mother and father   .  Arthritis  Mother     .  Asthma  Mother     .  Emphysema  Mother     .  Arthritis  Sister     .  Stroke  Sister          Social History History   Substance Use Topics   .  Smoking status:  Former Smoker -- 0.50 packs/day for 10 years       Types:  Cigarettes       Quit date:  06/01/1986   .  Smokeless tobacco:  Never Used         Comment: "quit smoking ~ 25 years ago; smoked ~ 1 pk/wk"   .  Alcohol Use:  No        No Known Allergies    Current Outpatient Prescriptions   Medication  Sig  Dispense  Refill   .  amoxicillin-clavulanate (AUGMENTIN) 875-125 MG per tablet           .  aspirin EC 81 MG tablet  Take 81 mg by mouth daily.           .  cholecalciferol (VITAMIN D) 1000 UNITS tablet  Take 1,000 Units by mouth daily.         .  furosemide (LASIX) 40 MG tablet  Take 40 mg by mouth every other day. Usually takes on MWF         .  losartan (COZAAR) 50 MG tablet  Take 1 tablet (50 mg total) by mouth daily.   30 tablet   6   .  Multiple Vitamins-Minerals (ICAPS MV PO)   Take 1 capsule by mouth daily.         .  nitroGLYCERIN (NITROSTAT) 0.4 MG SL tablet  Place 0.4 mg under the tongue every 5 (five) minutes as needed. Chest pain         .  omeprazole (PRILOSEC) 20 MG capsule  Take 20 mg by mouth 2 (two) times daily.          .  sertraline (ZOLOFT) 100 MG tablet  Take 100 mg by mouth daily.          Marland Kitchen  terazosin (HYTRIN) 2 MG capsule  Take 2 mg by mouth at bedtime.         .  traZODone (DESYREL) 50 MG tablet  Take 25 mg by mouth at bedtime.         .  triamcinolone cream (KENALOG) 0.1 %           .  guaiFENesin (MUCINEX) 600 MG 12 hr tablet  Take 1,200 mg by mouth daily. For congestion             No current facility-administered medications for this visit.        Review of Systems  Constitutional: Negative for fever, chills and unexpected weight change.  HENT: Negative for hearing loss, congestion, sore throat, trouble swallowing and voice change.   Eyes: Negative for visual disturbance.  Respiratory: Positive for apnea and shortness of breath. Negative for cough and wheezing.   Cardiovascular: Positive for chest pain. Negative for palpitations and leg swelling.  Gastrointestinal: Positive for nausea and abdominal pain. Negative for vomiting, diarrhea, constipation, blood in  stool, abdominal distention, anal bleeding and rectal pain.  Genitourinary: Negative for hematuria and difficulty urinating.  Musculoskeletal: Negative for arthralgias.  Skin: Negative for rash and wound.  Neurological: Negative for seizures, syncope, weakness and headaches.  Hematological: Negative for adenopathy. Does not bruise/bleed easily.  Psychiatric/Behavioral: Negative for confusion.      Blood pressure 140/72, pulse 64, temperature 97.1 F (36.2 C), temperature source Temporal, resp. rate 16, height 5\' 11"  (1.803 m), weight 252 lb 6.4 oz (114.488 kg).   Physical Exam  Constitutional: He is oriented to person, place, and time. He appears well-developed and  well-nourished. No distress.  BMI 35.  HENT:   Head: Normocephalic.   Nose: Nose normal.   Mouth/Throat: No oropharyngeal exudate.  Eyes: Conjunctivae and EOM are normal. Pupils are equal, round, and reactive to light. Right eye exhibits no discharge. Left eye exhibits no discharge. No scleral icterus.  Neck: Normal range of motion. Neck supple. No JVD present. No tracheal deviation present. No thyromegaly present.  Cardiovascular: Normal rate, regular rhythm, normal heart sounds and intact distal pulses.    No murmur heard. Pulmonary/Chest: Effort normal and breath sounds normal. No stridor. No respiratory distress. He has no wheezes. He has no rales. He exhibits no tenderness.  Abdominal: Soft. Bowel sounds are normal. He exhibits no distension and no mass. There is no tenderness. There is no rebound and no guarding.  Subjective right upper quadrant tenderness to deep palpation. Objectively no abnormalities on exam. No mass.  Musculoskeletal: Normal range of motion. He exhibits no edema and no tenderness.  Lymphadenopathy:    He has no cervical adenopathy.  Neurological: He is alert and oriented to person, place, and time. He has normal reflexes. Coordination normal.  Skin: Skin is warm and dry. No rash noted. He is not diaphoretic. No erythema. No pallor.  Psychiatric: He has a normal mood and affect. His behavior is normal. Judgment and thought content normal.      Data Reviewed Hospital records. Imaging studies. Notes from [facility].   Assessment    Two-month history nausea, early satiety, vague upper abdominal pain. It is very likely that biliary tract disease is the cause of his symptoms, and I suspect we will need to proceed with cholecystectomy.   Abnormal appearance of the gallbladder  ultrasound  with irregular thickening and gallstones and densely echogenic liver  suggests the need for further imaging to assess the possibility of neoplasia.   Coronary artery disease,  followed by Dr. Katrinka Blazing   COPD and sleep apnea, followed by Dr. Delford Field Addendum(09/27/2012): Dr. Delford Field sent note stating that he thinks patient will do well with cholecystectomy and he does not need to see him pre op.   Obesity   Hypertension   Hyperlipidemia   GERD.      Plan    I had a long talk with the patient and his wife about the differential diagnosis and  that eventually he will need to have his gallbladder removed.   We will schedule him for MRI of the abdomen to evaluate the gallbladder. If he cannot hold his breath and will change that to a CT scan. Would also get a CCK stimulated hepatobiliary scan Addendum (09/26/2012):  MRI and MRCP showed gallstones and fundal thickening. 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 There is no evidence of cancer. CBD not dilated. We called Mr. Rosemond and  offered to see him in the office to discuss these findings again. We also offered to proceed  with gallbladder surgery. It was his preference to proceed with scheduling of  gallbladder surgery, once his cardiac clearance with Dr. Katrinka Blazing is completed. We went ahead and put his orders him for this surgery, pending cardiac clearance.   We will ask Dr. Katrinka Blazing for cardiac clearance for cholecystectomy   MRI shows gallstones and adenomyosis of GB, doubt malignancy.  I did go over the details of laparoscopic cholecystectomy with patient and his wife. Patient information booklets were given. Risks were outlined. He understands all these issues.           Angelia Mould. Derrell Lolling, M.D., [facility] General and Minimally invasive Surgery Breast and Colorectal Surgery Office:   [phone] Pager:   [phone]

## 2012-10-11 ENCOUNTER — Encounter (HOSPITAL_COMMUNITY)
Admission: RE | Disposition: A | Discharge status: Home / Self Care | Payer: Self-pay | Source: Ambulatory Visit | Attending: General Surgery

## 2012-10-11 ENCOUNTER — Ambulatory Visit (HOSPITAL_COMMUNITY): Payer: Medicare Other | Admitting: Anesthesiology

## 2012-10-11 ENCOUNTER — Observation Stay (HOSPITAL_COMMUNITY)
Admission: RE | Admit: 2012-10-11 | Discharge: 2012-10-12 | Disposition: A | Discharge status: Home / Self Care | Payer: Medicare Other | Source: Ambulatory Visit | Attending: General Surgery | Admitting: General Surgery

## 2012-10-11 ENCOUNTER — Encounter (HOSPITAL_COMMUNITY): Payer: Self-pay | Admitting: *Deleted

## 2012-10-11 ENCOUNTER — Ambulatory Visit (HOSPITAL_COMMUNITY): Payer: Medicare Other

## 2012-10-11 ENCOUNTER — Encounter (HOSPITAL_COMMUNITY): Payer: Self-pay | Admitting: Anesthesiology

## 2012-10-11 DIAGNOSIS — I2510 Atherosclerotic heart disease of native coronary artery without angina pectoris: Secondary | ICD-10-CM | POA: Insufficient documentation

## 2012-10-11 DIAGNOSIS — K8010 Calculus of gallbladder with chronic cholecystitis without obstruction: Principal | ICD-10-CM | POA: Insufficient documentation

## 2012-10-11 DIAGNOSIS — K8000 Calculus of gallbladder with acute cholecystitis without obstruction: Secondary | ICD-10-CM | POA: Diagnosis present

## 2012-10-11 DIAGNOSIS — Z6835 Body mass index (BMI) 35.0-35.9, adult: Secondary | ICD-10-CM | POA: Insufficient documentation

## 2012-10-11 DIAGNOSIS — I10 Essential (primary) hypertension: Secondary | ICD-10-CM | POA: Insufficient documentation

## 2012-10-11 DIAGNOSIS — J4489 Other specified chronic obstructive pulmonary disease: Secondary | ICD-10-CM | POA: Insufficient documentation

## 2012-10-11 DIAGNOSIS — Z79899 Other long term (current) drug therapy: Secondary | ICD-10-CM | POA: Insufficient documentation

## 2012-10-11 DIAGNOSIS — K219 Gastro-esophageal reflux disease without esophagitis: Secondary | ICD-10-CM | POA: Insufficient documentation

## 2012-10-11 DIAGNOSIS — E785 Hyperlipidemia, unspecified: Secondary | ICD-10-CM | POA: Insufficient documentation

## 2012-10-11 DIAGNOSIS — Z9861 Coronary angioplasty status: Secondary | ICD-10-CM | POA: Insufficient documentation

## 2012-10-11 DIAGNOSIS — G4733 Obstructive sleep apnea (adult) (pediatric): Secondary | ICD-10-CM | POA: Insufficient documentation

## 2012-10-11 DIAGNOSIS — J449 Chronic obstructive pulmonary disease, unspecified: Secondary | ICD-10-CM | POA: Insufficient documentation

## 2012-10-11 HISTORY — PX: CHOLECYSTECTOMY: SHX55

## 2012-10-11 SURGERY — LAPAROSCOPIC CHOLECYSTECTOMY WITH INTRAOPERATIVE CHOLANGIOGRAM
Anesthesia: General | Site: Abdomen | Wound class: Contaminated

## 2012-10-11 MED ORDER — OXYCODONE HCL 5 MG/5ML PO SOLN: 5.0000 mg | Freq: Once | ORAL | Status: DC | PRN

## 2012-10-11 MED ORDER — PANTOPRAZOLE SODIUM 40 MG PO TBEC
40.0000 mg | DELAYED_RELEASE_TABLET | Freq: Every day | ORAL | Status: DC
  Administered 2012-10-12: 40 mg via ORAL
  Filled 2012-10-11: qty 1

## 2012-10-11 MED ORDER — SODIUM CHLORIDE 0.9 % IV SOLN
INTRAVENOUS | Status: DC | PRN
  Administered 2012-10-11: 08:00:00

## 2012-10-11 MED ORDER — ONDANSETRON HCL 4 MG/2ML IJ SOLN
INTRAMUSCULAR | Status: DC | PRN
  Administered 2012-10-11: 4 mg via INTRAVENOUS

## 2012-10-11 MED ORDER — ATORVASTATIN CALCIUM 40 MG PO TABS
40.0000 mg | ORAL_TABLET | Freq: Every day | ORAL | Status: DC
  Administered 2012-10-11: 40 mg via ORAL
  Filled 2012-10-11 (×2): qty 1

## 2012-10-11 MED ORDER — HEMOSTATIC AGENTS (NO CHARGE) OPTIME
TOPICAL | Status: DC | PRN
  Administered 2012-10-11: 1 via TOPICAL

## 2012-10-11 MED ORDER — ASPIRIN EC 81 MG PO TBEC
81.0000 mg | DELAYED_RELEASE_TABLET | Freq: Every day | ORAL | Status: DC
  Administered 2012-10-12: 81 mg via ORAL
  Filled 2012-10-11: qty 1

## 2012-10-11 MED ORDER — PHENYLEPHRINE HCL 10 MG/ML IJ SOLN
10.0000 mg | INTRAVENOUS | Status: DC | PRN
  Administered 2012-10-11: 10 ug/min via INTRAVENOUS

## 2012-10-11 MED ORDER — PHENYLEPHRINE HCL 10 MG/ML IJ SOLN
INTRAMUSCULAR | Status: DC | PRN
  Administered 2012-10-11: 80 ug via INTRAVENOUS

## 2012-10-11 MED ORDER — PROPOFOL 10 MG/ML IV BOLUS
INTRAVENOUS | Status: DC | PRN
  Administered 2012-10-11: 150 mg via INTRAVENOUS

## 2012-10-11 MED ORDER — PROMETHAZINE HCL 25 MG/ML IJ SOLN: 6.2500 mg | INTRAMUSCULAR | Status: DC | PRN

## 2012-10-11 MED ORDER — GLYCOPYRROLATE 0.2 MG/ML IJ SOLN
INTRAMUSCULAR | Status: DC | PRN
  Administered 2012-10-11: .4 mg via INTRAVENOUS

## 2012-10-11 MED ORDER — NITROGLYCERIN 0.4 MG SL SUBL: 0.4000 mg | SUBLINGUAL_TABLET | SUBLINGUAL | Status: DC | PRN

## 2012-10-11 MED ORDER — NEOSTIGMINE METHYLSULFATE 1 MG/ML IJ SOLN
INTRAMUSCULAR | Status: DC | PRN
  Administered 2012-10-11: 4 mg via INTRAVENOUS

## 2012-10-11 MED ORDER — HYDROCODONE-ACETAMINOPHEN 5-325 MG PO TABS
1.0000 | ORAL_TABLET | ORAL | Status: DC | PRN
  Administered 2012-10-11: 2 via ORAL
  Filled 2012-10-11: qty 2

## 2012-10-11 MED ORDER — EPHEDRINE SULFATE 50 MG/ML IJ SOLN
INTRAMUSCULAR | Status: DC | PRN
  Administered 2012-10-11: 10 mg via INTRAVENOUS
  Administered 2012-10-11: 5 mg via INTRAVENOUS

## 2012-10-11 MED ORDER — ONDANSETRON HCL 4 MG PO TABS: 4.0000 mg | ORAL_TABLET | Freq: Four times a day (QID) | ORAL | Status: DC | PRN

## 2012-10-11 MED ORDER — SERTRALINE HCL 100 MG PO TABS
100.0000 mg | ORAL_TABLET | Freq: Every day | ORAL | Status: DC
  Administered 2012-10-12: 100 mg via ORAL
  Filled 2012-10-11: qty 1

## 2012-10-11 MED ORDER — ROCURONIUM BROMIDE 100 MG/10ML IV SOLN
INTRAVENOUS | Status: DC | PRN
  Administered 2012-10-11: 50 mg via INTRAVENOUS

## 2012-10-11 MED ORDER — SUFENTANIL CITRATE 50 MCG/ML IV SOLN
INTRAVENOUS | Status: DC | PRN
  Administered 2012-10-11: 20 ug via INTRAVENOUS

## 2012-10-11 MED ORDER — BUPIVACAINE-EPINEPHRINE 0.25% -1:200000 IJ SOLN
INTRAMUSCULAR | Status: DC | PRN
  Administered 2012-10-11: 14 mL

## 2012-10-11 MED ORDER — VITAMIN D3 25 MCG (1000 UNIT) PO TABS
1000.0000 [IU] | ORAL_TABLET | Freq: Every day | ORAL | Status: DC
  Administered 2012-10-11 – 2012-10-12 (×2): 1000 [IU] via ORAL
  Filled 2012-10-11 (×2): qty 1

## 2012-10-11 MED ORDER — TERAZOSIN HCL 2 MG PO CAPS
2.0000 mg | ORAL_CAPSULE | Freq: Every day | ORAL | Status: DC
  Administered 2012-10-11: 2 mg via ORAL
  Filled 2012-10-11 (×2): qty 1

## 2012-10-11 MED ORDER — HYDROMORPHONE HCL PF 1 MG/ML IJ SOLN
INTRAMUSCULAR | Status: AC
  Filled 2012-10-11: qty 1

## 2012-10-11 MED ORDER — CHLORHEXIDINE GLUCONATE 4 % EX LIQD: 1.0000 "application " | Freq: Once | CUTANEOUS | Status: DC

## 2012-10-11 MED ORDER — LIDOCAINE HCL (CARDIAC) 20 MG/ML IV SOLN
INTRAVENOUS | Status: DC | PRN
  Administered 2012-10-11: 100 mg via INTRAVENOUS

## 2012-10-11 MED ORDER — HYDROMORPHONE HCL PF 1 MG/ML IJ SOLN: 0.5000 mg | INTRAMUSCULAR | Status: DC | PRN

## 2012-10-11 MED ORDER — OXYCODONE HCL 5 MG PO TABS: 5.0000 mg | ORAL_TABLET | Freq: Once | ORAL | Status: DC | PRN

## 2012-10-11 MED ORDER — POTASSIUM CHLORIDE IN NACL 20-0.9 MEQ/L-% IV SOLN
INTRAVENOUS | Status: DC
  Administered 2012-10-11 – 2012-10-12 (×2): via INTRAVENOUS
  Filled 2012-10-11 (×3): qty 1000

## 2012-10-11 MED ORDER — SODIUM CHLORIDE 0.9 % IR SOLN
Status: DC | PRN
  Administered 2012-10-11: 1

## 2012-10-11 MED ORDER — HEPARIN SODIUM (PORCINE) 5000 UNIT/ML IJ SOLN
5000.0000 [IU] | Freq: Three times a day (TID) | INTRAMUSCULAR | Status: DC
Start: 2012-10-12 — End: 2012-10-12
  Administered 2012-10-12: 5000 [IU] via SUBCUTANEOUS
  Filled 2012-10-11 (×3): qty 1

## 2012-10-11 MED ORDER — TRAZODONE 25 MG HALF TABLET
25.0000 mg | ORAL_TABLET | Freq: Every day | ORAL | Status: DC
  Administered 2012-10-11: 25 mg via ORAL
  Filled 2012-10-11 (×2): qty 1

## 2012-10-11 MED ORDER — LOSARTAN POTASSIUM 50 MG PO TABS
50.0000 mg | ORAL_TABLET | Freq: Every day | ORAL | Status: DC
  Administered 2012-10-11 – 2012-10-12 (×2): 50 mg via ORAL
  Filled 2012-10-11 (×2): qty 1

## 2012-10-11 MED ORDER — LACTATED RINGERS IV SOLN
INTRAVENOUS | Status: DC | PRN
  Administered 2012-10-11: 07:00:00 via INTRAVENOUS

## 2012-10-11 MED ORDER — HYDROMORPHONE HCL PF 1 MG/ML IJ SOLN
0.2500 mg | INTRAMUSCULAR | Status: DC | PRN
  Administered 2012-10-11: 0.25 mg via INTRAVENOUS

## 2012-10-11 MED ORDER — ARTIFICIAL TEARS OP OINT
TOPICAL_OINTMENT | OPHTHALMIC | Status: DC | PRN
  Administered 2012-10-11: 1 via OPHTHALMIC

## 2012-10-11 MED ORDER — BUDESONIDE-FORMOTEROL FUMARATE 160-4.5 MCG/ACT IN AERO
2.0000 | INHALATION_SPRAY | Freq: Two times a day (BID) | RESPIRATORY_TRACT | Status: DC
  Administered 2012-10-11 – 2012-10-12 (×2): 2 via RESPIRATORY_TRACT
  Filled 2012-10-11: qty 6

## 2012-10-11 MED ORDER — BUPIVACAINE-EPINEPHRINE PF 0.25-1:200000 % IJ SOLN
INTRAMUSCULAR | Status: AC
  Filled 2012-10-11: qty 30

## 2012-10-11 MED ORDER — CEFAZOLIN SODIUM-DEXTROSE 2-3 GM-% IV SOLR
INTRAVENOUS | Status: AC
  Administered 2012-10-11: 2 g via INTRAVENOUS
  Filled 2012-10-11: qty 50

## 2012-10-11 MED ORDER — FUROSEMIDE 40 MG PO TABS
40.0000 mg | ORAL_TABLET | ORAL | Status: DC
  Administered 2012-10-12: 40 mg via ORAL
  Filled 2012-10-11: qty 1

## 2012-10-11 MED ORDER — ONDANSETRON HCL 4 MG/2ML IJ SOLN
4.0000 mg | Freq: Four times a day (QID) | INTRAMUSCULAR | Status: DC | PRN
  Administered 2012-10-12: 4 mg via INTRAVENOUS
  Filled 2012-10-11: qty 2

## 2012-10-11 MED ORDER — 0.9 % SODIUM CHLORIDE (POUR BTL) OPTIME
TOPICAL | Status: DC | PRN
  Administered 2012-10-11: 1000 mL

## 2012-10-11 SURGICAL SUPPLY — 42 items
ADH SKN CLS APL DERMABOND .7 (GAUZE/BANDAGES/DRESSINGS) ×1
APPLIER CLIP ROT 10 11.4 M/L (STAPLE) ×2
APR CLP MED LRG 11.4X10 (STAPLE) ×1
BAG SPEC RTRVL LRG 6X4 10 (ENDOMECHANICALS) ×1
BLADE SURG ROTATE 9660 (MISCELLANEOUS) IMPLANT
CANISTER SUCTION 2500CC (MISCELLANEOUS) ×2 IMPLANT
CHLORAPREP W/TINT 26ML (MISCELLANEOUS) ×2 IMPLANT
CLIP APPLIE ROT 10 11.4 M/L (STAPLE) ×1 IMPLANT
CLOTH BEACON ORANGE TIMEOUT ST (SAFETY) ×2 IMPLANT
COVER MAYO STAND STRL (DRAPES) ×2 IMPLANT
COVER SURGICAL LIGHT HANDLE (MISCELLANEOUS) ×2 IMPLANT
DECANTER SPIKE VIAL GLASS SM (MISCELLANEOUS) ×4 IMPLANT
DERMABOND ADVANCED (GAUZE/BANDAGES/DRESSINGS) ×1
DERMABOND ADVANCED .7 DNX12 (GAUZE/BANDAGES/DRESSINGS) ×1 IMPLANT
DRAPE C-ARM 42X72 X-RAY (DRAPES) ×2 IMPLANT
DRAPE UTILITY 15X26 W/TAPE STR (DRAPE) ×4 IMPLANT
ELECT REM PT RETURN 9FT ADLT (ELECTROSURGICAL) ×2
ELECTRODE REM PT RTRN 9FT ADLT (ELECTROSURGICAL) ×1 IMPLANT
GLOVE BIOGEL PI IND STRL 7.0 (GLOVE) IMPLANT
GLOVE BIOGEL PI INDICATOR 7.0 (GLOVE) ×1
GLOVE EUDERMIC 7 POWDERFREE (GLOVE) ×2 IMPLANT
GLOVE SURG SS PI 7.0 STRL IVOR (GLOVE) ×3 IMPLANT
GOWN PREVENTION PLUS XLARGE (GOWN DISPOSABLE) ×2 IMPLANT
GOWN STRL NON-REIN LRG LVL3 (GOWN DISPOSABLE) ×6 IMPLANT
HEMOSTAT SNOW SURGICEL 2X4 (HEMOSTASIS) ×1 IMPLANT
KIT BASIN OR (CUSTOM PROCEDURE TRAY) ×2 IMPLANT
KIT ROOM TURNOVER OR (KITS) ×2 IMPLANT
NS IRRIG 1000ML POUR BTL (IV SOLUTION) ×2 IMPLANT
PAD ARMBOARD 7.5X6 YLW CONV (MISCELLANEOUS) ×2 IMPLANT
POUCH SPECIMEN RETRIEVAL 10MM (ENDOMECHANICALS) ×2 IMPLANT
SCISSORS LAP 5X35 DISP (ENDOMECHANICALS) IMPLANT
SET CHOLANGIOGRAPH 5 50 .035 (SET/KITS/TRAYS/PACK) ×2 IMPLANT
SET IRRIG TUBING LAPAROSCOPIC (IRRIGATION / IRRIGATOR) ×2 IMPLANT
SLEEVE ENDOPATH XCEL 5M (ENDOMECHANICALS) ×2 IMPLANT
SPECIMEN JAR SMALL (MISCELLANEOUS) ×2 IMPLANT
SUT MNCRL AB 4-0 PS2 18 (SUTURE) ×2 IMPLANT
TOWEL OR 17X24 6PK STRL BLUE (TOWEL DISPOSABLE) ×2 IMPLANT
TOWEL OR 17X26 10 PK STRL BLUE (TOWEL DISPOSABLE) ×2 IMPLANT
TRAY LAPAROSCOPIC (CUSTOM PROCEDURE TRAY) ×2 IMPLANT
TROCAR XCEL BLUNT TIP 100MML (ENDOMECHANICALS) ×2 IMPLANT
TROCAR XCEL NON-BLD 11X100MML (ENDOMECHANICALS) ×2 IMPLANT
TROCAR XCEL NON-BLD 5MMX100MML (ENDOMECHANICALS) ×2 IMPLANT

## 2012-10-11 NOTE — Anesthesia Procedure Notes (Signed)
Procedure Name: Intubation Date/Time: 10/11/2012 7:30 AM Performed by: Coralee Rud Pre-anesthesia Checklist: Patient identified, Emergency Drugs available, Suction available, Patient being monitored and Timeout performed Patient Re-evaluated:Patient Re-evaluated prior to inductionOxygen Delivery Method: Circle system utilized Preoxygenation: Pre-oxygenation with 100% oxygen Intubation Type: IV induction Ventilation: Mask ventilation without difficulty Laryngoscope Size: Miller and 3 Grade View: Grade I Tube size: 7.5 mm Number of attempts: 1 Airway Equipment and Method: Stylet Placement Confirmation: ETT inserted through vocal cords under direct vision,  breath sounds checked- equal and bilateral and positive ETCO2 Secured at: 22 cm Tube secured with: Tape Dental Injury: Teeth and Oropharynx as per pre-operative assessment

## 2012-10-11 NOTE — Anesthesia Postprocedure Evaluation (Signed)
Anesthesia Post Note  Patient: Bryan Hatfield  Procedure(s) Performed: Procedure(s) (LRB): LAPAROSCOPIC CHOLECYSTECTOMY WITH INTRAOPERATIVE CHOLANGIOGRAM (N/A)  Anesthesia type: general  Patient location: PACU  Post pain: Pain level controlled  Post assessment: Patient's Cardiovascular Status Stable  Last Vitals:  Filed Vitals:   10/11/12 0845  BP: 136/58  Pulse: 66  Temp: 36.4 C  Resp: 18    Post vital signs: Reviewed and stable  Level of consciousness: sedated  Complications: No apparent anesthesia complications

## 2012-10-11 NOTE — Transfer of Care (Signed)
Immediate Anesthesia Transfer of Care Note  Patient: Bryan Hatfield  Procedure(s) Performed: Procedure(s): LAPAROSCOPIC CHOLECYSTECTOMY WITH INTRAOPERATIVE CHOLANGIOGRAM (N/A)  Patient Location: PACU  Anesthesia Type:General  Level of Consciousness: awake, alert  and oriented  Airway & Oxygen Therapy: Patient Spontanous Breathing and Patient connected to face mask oxygen  Post-op Assessment: Report given to PACU RN, Post -op Vital signs reviewed and stable and Patient moving all extremities  Post vital signs: Reviewed and stable  Complications: No apparent anesthesia complications

## 2012-10-11 NOTE — Op Note (Addendum)
Patient Name:           Bryan Hatfield   Date of Surgery:        10/11/2012  Pre op Diagnosis:      Chronic cholecystitis with cholelithiasis  Post op Diagnosis:    Same  Procedure:                 Laparoscopic cholecystectomy with cholangiogram  Surgeon:                     Angelia Mould. Derrell Lolling, M.D., FACS  Assistant:                      Axel Filler, M.D.  Operative Indications:   Bryan Hatfield is a 79 y.o. male. He is referred by Dr. Carman Ching because of recent onset nausea, a vague pain, and gallstones. Dr. Verdis Prime is his cardiologist. Dr. Danise Mina is his pulmonologist. Dr. Marjory Lies is his primary care physician.  This patient was hospitalized in December of 2013 with weakness, nausea, diaphoresis and vague chest pain. A CT angiogram of the chest showed no pulmonary embolus. A gallstone was noted but the gallbladder really didn't look all that abnormal. Nuclear medicine test suggested some ischemia, but possibly this was a false positive. Dr. Katrinka Blazing performed a cardiac catheterization on May 31, 2012, and he was told this was unremarkable.  He recently saw Dr. Randa Evens and reported early satiety, a 2 month history of nausea which is a new problem for him, minimal but admittedly upper abdominal discomfort. Liver function tests, CBC, lipase are normal. No change in his bowel habits.  Ultrasound showed irregular thickening of gallbladder wall, gallstones, common bile duct 2.4 mm. Differential diagnosis according to the radiologist was chronic cholecystitis versus gallbladder carcinoma. MRCP Showed benign findings consistent with adenomyosis of the gallbladder.  Comorbidities include BMI of 35, COPD, sleep apnea, hypertension, coronary disease and a stent placed in [facility] with normal LV function, hyperlipidemia, and GERD. He takes aspirin. He has been evaluated by Dr. Danise Mina and Dr. Verdis Prime for clearance preop.   Operative Findings:       The gallbladder was  chronically inflamed. It was discolored, slightly thick walled, and had extensive adhesions to it. The gallbladder and surrounding liver were friable and bled a little bit more easily than usual but the dissection was uneventful. The anatomy of the cystic duct, cystic artery and common bile duct were conventional. Intraoperative cholangiogram was normal, showing normal intrahepatic and extrahepatic biliary anatomy, no ductal dilatation, no filling defects, and no obstruction with good flow of contrast into the duodenum. The liver, stomach, duodenum, small bowel, and large bowel were grossly normal to inspection.  Procedure in Detail:          Following the induction of general endotracheal anesthesia, the patient's abdomen was prepped and draped in a sterile fashion. Intravenous antibiotics were given and a surgical time out was performed. 0.5% Marcaine with epinephrine was used as local infiltration anesthetic. An 11 mm Hassan trocar was placed at the lower rim of the umbilicus with an open technique. Entry was uneventful. Pneumoperitoneum was created. An 11 mm trocar was placed in the subxiphoid region two 5 mm trocars placed in the right upper quadrant. The gallbladder was partially intrahepatic. We lifted up the fundus then retracted the omentum downward and then we were slowly able to take the adhesions down and identified via infundibulum. We slowly dissected  out the cystic duct and cystic artery. A cholangiogram catheter was inserted into the cystic duct and a cholangiogram was obtained using the C-arm. The cholangiogram was normal as described above. The cholangiogram catheter was removed, the cystic duct secured with multiple metal clips and divided. We isolated the cystic artery and secured it with metal clips and divided it.  The gallbladder was dissected from its bed with electrocautery placed in a specimen bag and removed . We spilled a little bit of bile but did not spill any stones. The operative  field was copiously irrigated with saline. The liver bed was raw and required some electrocautery. Hemostatic sponge was placed in the bed of the gallbladder. These looked dry at the end of the case and there was no bleeding or bile leak that we could detect. We evacuated all the irrigation fluid. The trocars were removed and there was no bleeding there. The pneumoperitoneum was released. The fascia at the umbilicus was closed with 0 Vicryl sutures. Skin incisions were closed with subcuticular sutures of 4 Monocryl and Dermabond. The patient tolerated the procedure well and was taken to recovery room in stable condition. EBL 20 cc. Counts correct. Complications none.     Angelia Mould. Derrell Lolling, M.D., FACS General and Minimally Invasive Surgery Breast and Colorectal Surgery  10/11/2012 8:43 AM

## 2012-10-11 NOTE — Progress Notes (Signed)
Patient admitted to [facility] room 6. S/P lap chole. Abdominal sites clean and dry. Call bell in reach. Family at bedside.

## 2012-10-11 NOTE — Anesthesia Preprocedure Evaluation (Signed)
Anesthesia Evaluation    Reviewed: Allergy & Precautions, H&P , NPO status , Patient's Chart, lab work & pertinent test results  History of Anesthesia Complications Negative for: history of anesthetic complications  Airway       Dental   Pulmonary sleep apnea and Continuous Positive Airway Pressure Ventilation , COPD         Cardiovascular hypertension, Pt. on medications + CAD and + Peripheral Vascular Disease     Neuro/Psych Anxiety negative neurological ROS     GI/Hepatic Neg liver ROS, GERD-  Medicated,  Endo/Other  Morbid obesity  Renal/GU negative Renal ROS     Musculoskeletal   Abdominal   Peds  Hematology   Anesthesia Other Findings   Reproductive/Obstetrics                           Anesthesia Physical Anesthesia Plan  ASA: III  Anesthesia Plan: General   Post-op Pain Management:    Induction: Intravenous  Airway Management Planned: Oral ETT  Additional Equipment:   Intra-op Plan:   Post-operative Plan: Extubation in OR  Informed Consent:   Plan Discussed with: CRNA, Anesthesiologist and Surgeon  Anesthesia Plan Comments:         Anesthesia Quick Evaluation

## 2012-10-11 NOTE — Preoperative (Signed)
Beta Blockers   Reason not to administer Beta Blockers:Not Applicable 

## 2012-10-11 NOTE — Interval H&P Note (Signed)
History and Physical Interval Note:  10/11/2012 7:06 AM  Bryan Hatfield  has presented today for surgery, with the diagnosis of gallstones  The goals and the various methods of treatment have been discussed with the patient and family. After consideration of risks, benefits and other options for treatment, the patient has consented to  Procedure(s): LAPAROSCOPIC CHOLECYSTECTOMY WITH INTRAOPERATIVE CHOLANGIOGRAM, possible open (N/A) as a surgical intervention .  The patient's history has been reviewed, patient examined today, no change in status, stable for surgery.  I have reviewed the patient's chart and labs.  Questions were answered to the patient's satisfaction.     [name]

## 2012-10-12 ENCOUNTER — Encounter (HOSPITAL_COMMUNITY): Payer: Self-pay | Admitting: General Surgery

## 2012-10-12 MED ORDER — TRAMADOL HCL 50 MG PO TABS: 50.0000 mg | ORAL_TABLET | Freq: Four times a day (QID) | ORAL | Status: DC | PRN

## 2012-10-12 MED ORDER — PROMETHAZINE HCL 25 MG/ML IJ SOLN
12.5000 mg | INTRAMUSCULAR | Status: DC | PRN
  Administered 2012-10-12: 12.5 mg via INTRAVENOUS
  Filled 2012-10-12: qty 1

## 2012-10-12 MED ORDER — KETOROLAC TROMETHAMINE 15 MG/ML IJ SOLN: 15.0000 mg | Freq: Four times a day (QID) | INTRAMUSCULAR | Status: DC | PRN

## 2012-10-12 NOTE — Progress Notes (Signed)
1 Day Post-Op  Subjective: Doing well except has had some nausea the last 3 or 4 hours. Has not vomited. Denies pain. Voiding well. No respiratory problems. Denies chest pain or dyspnea.  Objective: Vital signs in last 24 hours: Temp:  [97.4 F (36.3 C)-98.2 F (36.8 C)] 98.2 F (36.8 C) (05/14 0526) Pulse Rate:  [53-73] 73 (05/14 0526) Resp:  [15-23] 20 (05/14 0526) BP: (86-136)/(45-76) 98/55 mmHg (05/14 0526) SpO2:  [93 %-100 %] 94 % (05/14 0526) Weight:  [254 lb 10.1 oz (115.5 kg)] 254 lb 10.1 oz (115.5 kg) (05/13 1333) Last BM Date: 10/10/12  Intake/Output from previous day: 05/13 0701 - 05/14 0700 In: 2548 [P.O.:720; I.V.:1828] Out: 1420 [Urine:1420] Intake/Output this shift: Total I/O In: -  Out: 720 [Urine:720]  General appearance: the patient is alert and looks well. No distress. Alert cooperative. Mental status normal. Wife is in room. Afebrile. Vital signs stable. Resp: clear to auscultation bilaterally Male genitalia: normal, abdomen is soft. Nondistended. Wounds looked fine. Minimal incisional tenderness. Benign postop exam.  Lab Results:  No results found for this or any previous visit (from the past 24 hour(s)).   Studies/Results: @RISRSLT24 @  . aspirin EC  81 mg Oral Daily  . atorvastatin  40 mg Oral q1800  . budesonide-formoterol  2 puff Inhalation BID  . cholecalciferol  1,000 Units Oral Daily  . furosemide  40 mg Oral QODAY  . heparin subcutaneous  5,000 Units Subcutaneous Q8H  . losartan  50 mg Oral Daily  . pantoprazole  40 mg Oral Daily  . sertraline  100 mg Oral Daily  . terazosin  2 mg Oral QHS  . traZODone  25 mg Oral QHS     Assessment/Plan: s/p Procedure(s): LAPAROSCOPIC CHOLECYSTECTOMY WITH INTRAOPERATIVE CHOLANGIOGRAM   POD #1. Laparoscopic cholecystectomy with cholangiogram. No apparent direct surgical problems identified.  Nausea. This was his main complaint as an outpatient. Doubt this is a sign of a  complication of surgery.  Suspect medication or anesthesia-related. We'll discontinue Dilaudid and Vicodin and substitute toradol (IV) and tramadol(low dose po).     Advance diet and activities. Discharge home when tolerating diet and nausea has resolved.  COPD OSA HTN-controlled CAD with preserved EF. GERD on PPI  @PROBHOSP @  LOS: 1 day    Madilyn Cephas M. Derrell Lolling, M.D., [facility] General and Minimally invasive Surgery Breast and Colorectal Surgery Office:   [phone] Pager:   [phone]  10/12/2012  . .prob

## 2012-10-13 NOTE — Discharge Summary (Signed)
Patient ID: Bryan Hatfield [id number] 79 y.o. 1926-08-09  Admit date: 10/11/2012  Discharge date and time: 10/12/2012  1:41 PM  Admitting Physician: [name]  Discharge Physician: [name]  Admission Diagnoses: gallstones  Discharge Diagnoses: Chronic cholecystitis with cholelithiasis Coronary artery disease, followed by Dr. Verdis Prime COPD Sleep apnea Obesity Hypertension GERD.  Operations: Procedure(s): LAPAROSCOPIC CHOLECYSTECTOMY WITH INTRAOPERATIVE CHOLANGIOGRAM  Admission Condition: good  Discharged Condition: good  Indication for Admission: Bryan Hatfield is a 80 y.o. male. He is referred by Dr. Carman Ching because of recent onset nausea, a vague pain, and gallstones. Dr. Verdis Prime is his cardiologist. Dr. Danise Mina is his pulmonologist. Dr. Marjory Lies is his primary care physician.  This patient was hospitalized in December of 2013 with weakness, nausea, diaphoresis and vague chest pain. A CT angiogram of the chest showed no pulmonary embolus. A gallstone was noted but the gallbladder really didn't look all that abnormal. Nuclear medicine test suggested some ischemia, but possibly this was a false positive. Dr. Katrinka Blazing performed a cardiac catheterization on December 31, coronary the patient, and he was told this was unremarkable.  He recently saw Dr. Randa Evens and reported early satiety, a 2 month history of nausea which is a new problem for him, minimal but admittedly upper abdominal discomfort. Liver function tests, CBC, lipase are normal. No change in his bowel habits.  Ultrasound showed irregular thickening of gallbladder wall, gallstones, common bile duct 2.4 mm. Differential diagnosis according to the radiologist was chronic cholecystitis versus gallbladder carcinoma. 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  Comorbidities include BMI of 35, COPD, sleep apnea, hypertension, coronary disease and a stent  placed in [facility] with normal LV function, hyperlipidemia, and GERD. He takes aspirin. He is brought to the hospital electively for cholecystectomy   Hospital Course: On the day of admission the patient was taken to the operating room and underwent a laparoscopic cholecystectomy with cholangiogram. The gallbladder was found to be chronically inflamed with extensive adhesions to it. The liver was friable. No evidence of primary liver disease, however.The cholangiogram was normal, negative for retained stone or stricture.Postoperatively the patient did well. He had some nausea but when we switched his pain medicine around that resolved and he tolerated breakfast and lunch and was ready to go home. Abdominal exam was unremarkable. Wounds looked good. Diet and activities were discussed. He was told to continue all his usual medications. He was given a prescription for Tylenol for pain since that seemed to work without nausea. He was given followup appointment with me.  Consults: None  Significant Diagnostic Studies: none  Treatments: surgery: Laparoscopic cholecystectomy with cholangiogram  Disposition: Home  Patient Instructions:    Medication List    TAKE these medications       aspirin EC 81 MG tablet  Take 81 mg by mouth daily.     budesonide-formoterol 160-4.5 MCG/ACT inhaler  Commonly known as:  SYMBICORT  Inhale 2 puffs into the lungs 2 (two) times daily.     cholecalciferol 1000 UNITS tablet  Commonly known as:  VITAMIN D  Take 1,000 Units by mouth daily.     furosemide 40 MG tablet  Commonly known as:  LASIX  Take 40 mg by mouth every other day. Usually takes on MWF     ICAPS MV PO  Take 1 capsule by mouth daily.     losartan 50 MG tablet  Commonly known as:  COZAAR  Take 1 tablet (  50 mg total) by mouth daily.     nitroGLYCERIN 0.4 MG SL tablet  Commonly known as:  NITROSTAT  Place 0.4 mg under the tongue every 5 (five) minutes as needed for chest pain.      omeprazole 20 MG capsule  Commonly known as:  PRILOSEC  Take 20 mg by mouth 2 (two) times daily.     sertraline 100 MG tablet  Commonly known as:  ZOLOFT  Take 100 mg by mouth daily.     simvastatin 80 MG tablet  Commonly known as:  ZOCOR  Take one tablet every evening     terazosin 2 MG capsule  Commonly known as:  HYTRIN  Take 2 mg by mouth at bedtime.     traMADol 50 MG tablet  Commonly known as:  ULTRAM  Take 1 tablet (50 mg total) by mouth every 6 (six) hours as needed for pain.     traZODone 50 MG tablet  Commonly known as:  DESYREL  Take 25 mg by mouth at bedtime.        Activity: activity as tolerated Diet: low fat, low cholesterol diet Wound Care: none needed  Follow-up:  With Dr. Derrell Lolling in 2 weeks.  Signed: Angelia Mould. Derrell Lolling, M.D., FACS General and minimally invasive surgery Breast and Colorectal Surgery  10/13/2012, 6:45 AM

## 2012-10-25 ENCOUNTER — Ambulatory Visit (INDEPENDENT_AMBULATORY_CARE_PROVIDER_SITE_OTHER): Payer: Medicare Other | Admitting: General Surgery

## 2012-10-25 ENCOUNTER — Encounter (INDEPENDENT_AMBULATORY_CARE_PROVIDER_SITE_OTHER): Payer: Self-pay | Admitting: General Surgery

## 2012-10-25 VITALS — BP 132/76 | HR 70 | Temp 98.0°F | Resp 18 | Ht 71.0 in | Wt 250.0 lb

## 2012-10-25 DIAGNOSIS — K8000 Calculus of gallbladder with acute cholecystitis without obstruction: Secondary | ICD-10-CM

## 2012-10-25 NOTE — Patient Instructions (Signed)
You appear to have recovered from your gallbladder surgery without any obvious complication.  You may resume exercise and you may go to the [facility].  Follow a low-fat diet.  Return to see Dr. Derrell Lolling if further problems arise.

## 2012-10-25 NOTE — Progress Notes (Signed)
Patient ID: Bryan Hatfield, male   DOB: 1927/01/14, 79 y.o.   MRN: [id number] History: This patient underwent laparoscopic cholecystectomy with cholangiogram on 10/11/2012 for histologically confirmed chronic cholecystitis with cholelithiasis. He has done well. He's got more energy. Denies abdominal pain or digestive problems. Denies diarrhea. Wants to go back to the [facility]. Significant comorbidities including BMI of 35, COPD, sleep apnea, hypertension, coronary disease with stent, GERD, followed by Peggye Pitt and Keturah Shavers.  Exam: Patient looks well. Wife is with him. Good spirits. Abdomen soft. Nontender. Trocar sites well-healed  Assessment: Chronic cholecystitis with cholelithiasis, uneventful recovery following laparoscopic cholecystectomy and cholangiogram  Plan: Low fat diet Resume normal activities, no restriction Return to see me if further problems arise.   Angelia Mould. Derrell Lolling, M.D., [facility] General and Minimally invasive Surgery Breast and Colorectal Surgery Office:   [phone] Pager:   [phone]

## 2013-06-02 ENCOUNTER — Telehealth: Payer: Self-pay | Admitting: *Deleted

## 2013-06-02 ENCOUNTER — Ambulatory Visit (INDEPENDENT_AMBULATORY_CARE_PROVIDER_SITE_OTHER): Payer: Medicare Other | Admitting: Family Medicine

## 2013-06-02 ENCOUNTER — Encounter: Payer: Self-pay | Admitting: Family Medicine

## 2013-06-02 VITALS — BP 138/71 | HR 63 | Ht 71.0 in | Wt 248.0 lb

## 2013-06-02 DIAGNOSIS — R7303 Prediabetes: Secondary | ICD-10-CM

## 2013-06-02 DIAGNOSIS — G4700 Insomnia, unspecified: Secondary | ICD-10-CM

## 2013-06-02 DIAGNOSIS — L309 Dermatitis, unspecified: Secondary | ICD-10-CM

## 2013-06-02 DIAGNOSIS — F329 Major depressive disorder, single episode, unspecified: Secondary | ICD-10-CM | POA: Insufficient documentation

## 2013-06-02 DIAGNOSIS — F32A Depression, unspecified: Secondary | ICD-10-CM

## 2013-06-02 DIAGNOSIS — R7309 Other abnormal glucose: Secondary | ICD-10-CM

## 2013-06-02 DIAGNOSIS — E785 Hyperlipidemia, unspecified: Secondary | ICD-10-CM

## 2013-06-02 DIAGNOSIS — I10 Essential (primary) hypertension: Secondary | ICD-10-CM

## 2013-06-02 DIAGNOSIS — I2510 Atherosclerotic heart disease of native coronary artery without angina pectoris: Secondary | ICD-10-CM

## 2013-06-02 DIAGNOSIS — F3289 Other specified depressive episodes: Secondary | ICD-10-CM

## 2013-06-02 DIAGNOSIS — F3342 Major depressive disorder, recurrent, in full remission: Secondary | ICD-10-CM | POA: Insufficient documentation

## 2013-06-02 MED ORDER — TRIAMCINOLONE ACETONIDE 0.1 % EX CREA: TOPICAL_CREAM | CUTANEOUS | Status: DC

## 2013-06-02 MED ORDER — NITROGLYCERIN 0.4 MG SL SUBL: 0.4000 mg | SUBLINGUAL_TABLET | SUBLINGUAL | Status: DC | PRN

## 2013-06-02 MED ORDER — TERAZOSIN HCL 2 MG PO CAPS: 2.0000 mg | ORAL_CAPSULE | Freq: Every day | ORAL | Status: DC

## 2013-06-02 MED ORDER — SIMVASTATIN 80 MG PO TABS: 80.0000 mg | ORAL_TABLET | Freq: Every evening | ORAL | Status: DC

## 2013-06-02 MED ORDER — SERTRALINE HCL 100 MG PO TABS: 100.0000 mg | ORAL_TABLET | Freq: Every day | ORAL | Status: DC

## 2013-06-02 MED ORDER — TRAZODONE HCL 50 MG PO TABS: 50.0000 mg | ORAL_TABLET | Freq: Every day | ORAL | Status: DC

## 2013-06-02 MED ORDER — FUROSEMIDE 40 MG PO TABS: 40.0000 mg | ORAL_TABLET | ORAL | Status: DC

## 2013-06-02 MED ORDER — LOSARTAN POTASSIUM 50 MG PO TABS: 50.0000 mg | ORAL_TABLET | Freq: Every day | ORAL | Status: DC

## 2013-06-02 NOTE — Telephone Encounter (Signed)
Pt used triamcinolone 0.1% cream

## 2013-06-02 NOTE — Progress Notes (Signed)
CC: Bryan Hatfield is a 80 y.o. male is here for Establish Care   Subjective: HPI:  Very pleasant 80 year old here to establish care. He would like to go over all of his medications and wants to know if he can stop any of his medications  Essential hypertension: No outside blood pressures to report. He has been terazosin, losartan, Lasix for over a year he believes. He denies any known side effects from his medications. Denies chest pain, shortness of breath, orthopnea, peripheral edema, motor or sensory disturbances  History of hyperlipidemia: Last lipid panel was obtained a low over a year ago and these were fantastic. He has been on simvastatin for over a year taken this on a daily basis without right upper quadrant pain, myalgias, or any known side effects. No formal exercise program he does watch what he eats.  Insomnia: Currently is taking trazodone 25-50 mg as needed for sleep. He reports inability to stay asleep and get to sleep if he does not take this medication. He is currently happy with his current sleep habit. Denies nonrestorative sleep or pain contributing to his sleep disturbance  Depression: He has been on Zoloft for unknown amount of time that he knows of. He states that he is using this for depression and it has helped significantly since he started it. He denies any current subjective depression or mental disturbance.  On chart review he has a history of prediabetes with his most recent A1c 6.0    Review Of Systems Outlined In HPI  Past Medical History  Diagnosis Date  . CAD (coronary artery disease)     a. PTCA 2001. b. f/u cath 2008: mild nonobstructive CAD.  Marland Kitchen Hypertension   . Acute respiratory failure 09/25/11    hypoxic  . Peripheral vascular disease     patient denies knowledge of, but listed in his chart  . High cholesterol   . Anxiety   . Pleural effusion     Parapneumonic ?r/t PNA s/p thoracentesis 2008  . GERD (gastroesophageal reflux disease)   . Colon  polyps   . COPD (chronic obstructive pulmonary disease)   . Pneumonia     hx  . Sleep apnea     cpap      Family History  Problem Relation Age of Onset  . Stroke      Mother and father  . Arthritis Mother   . Asthma Mother   . Emphysema Mother   . Arthritis Sister   . Stroke Sister      History  Substance Use Topics  . Smoking status: Former Smoker -- 1.00 packs/day for 10 years    Types: Cigarettes    Quit date: 06/01/1982  . Smokeless tobacco: Never Used     Comment: "quit smoking ~ 25 years ago; smoked ~ 1 pk/wk"  . Alcohol Use: No     Objective: Filed Vitals:   06/02/13 1333  BP: 138/71  Pulse: 63    General: Alert and Oriented, No Acute Distress HEENT: Pupils equal, round, reactive to light. Conjunctivae clear.  Moist mucous membranes pharynx unremarkable Lungs: Clear to auscultation bilaterally, no wheezing/ronchi/rales.  Comfortable work of breathing. Good air movement. Cardiac: Regular rate and rhythm. Normal S1/S2.  No murmurs, rubs, nor gallops.   Abdomen: Obese soft nontender Extremities: No peripheral edema.  Strong peripheral pulses.  Mental Status: No depression, anxiety, nor agitation. Skin: Warm and dry.  Assessment & Plan: Bryan Hatfield was seen today for establish care.  Diagnoses and associated  orders for this visit:  Essential hypertension, benign - BASIC METABOLIC PANEL WITH GFR  Hyperlipidemia - Lipid panel  Prediabetes - Hemoglobin A1c  INSOMNIA UNSPECIFIED  Depression  Coronary artery disease  Other Orders - furosemide (LASIX) 40 MG tablet; Take 1 tablet (40 mg total) by mouth every other day. Usually takes on MWF - losartan (COZAAR) 50 MG tablet; Take 1 tablet (50 mg total) by mouth daily. - nitroGLYCERIN (NITROSTAT) 0.4 MG SL tablet; Place 1 tablet (0.4 mg total) under the tongue every 5 (five) minutes as needed for chest pain. - sertraline (ZOLOFT) 100 MG tablet; Take 1 tablet (100 mg total) by mouth daily. - simvastatin (ZOCOR)  80 MG tablet; Take 1 tablet (80 mg total) by mouth every evening. Take one tablet every evening - terazosin (HYTRIN) 2 MG capsule; Take 1 capsule (2 mg total) by mouth at bedtime. - traZODone (DESYREL) 50 MG tablet; Take 1 tablet (50 mg total) by mouth at bedtime.    essential hypertension: Controlled continue current antihypertensive regimen Hyperlipidemia: Due for repeat fasting lipid panel for the time being continue simvastatin Prediabetes: Due for recheck of A1c Insomnia: Controlled I discussed that he could consider stopping trazodone However he feels that quality of life is more impacted by insomnia that would result as opposed to nuisance of taking medication every evening Depression: Controlled I discussed that he could consider tapering off of Zoloft however he feels that quality of life is more impacted by depression that would return as opposed to nuisance of taking this medication on a daily basis History of coronary artery disease: He's on aspirin and a statin, controlled and stable   Return in about 3 months (around 08/31/2013).

## 2013-06-08 ENCOUNTER — Encounter: Payer: Self-pay | Admitting: Family Medicine

## 2013-06-08 DIAGNOSIS — R7309 Other abnormal glucose: Secondary | ICD-10-CM | POA: Insufficient documentation

## 2013-06-08 DIAGNOSIS — R7303 Prediabetes: Secondary | ICD-10-CM | POA: Insufficient documentation

## 2013-06-08 LAB — LIPID PANEL
CHOL/HDL RATIO: 2.2 ratio
Cholesterol: 155 mg/dL (ref 0–200)
HDL: 70 mg/dL (ref >39)
LDL CALC: 57 mg/dL (ref 0–99)
Triglycerides: 138 mg/dL (ref <150)
VLDL: 28 mg/dL (ref 0–40)

## 2013-06-08 LAB — BASIC METABOLIC PANEL WITH GFR
BUN: 20 mg/dL (ref 6–23)
CALCIUM: 8.6 mg/dL (ref 8.4–10.5)
CO2: 30 mEq/L (ref 19–32)
CREATININE: 0.97 mg/dL (ref 0.50–1.35)
Chloride: 100 mEq/L (ref 96–112)
GFR, EST AFRICAN AMERICAN: 83 mL/min
GFR, EST NON AFRICAN AMERICAN: 71 mL/min
Glucose, Bld: 153 mg/dL — ABNORMAL HIGH (ref 70–99)
Potassium: 4.3 mEq/L (ref 3.5–5.3)
Sodium: 138 mEq/L (ref 135–145)

## 2013-06-08 LAB — HEMOGLOBIN A1C
HEMOGLOBIN A1C: 6.1 % — AB (ref <5.7)
MEAN PLASMA GLUCOSE: 128 mg/dL — AB (ref <117)

## 2013-08-14 ENCOUNTER — Encounter: Payer: Self-pay | Admitting: *Deleted

## 2013-08-15 ENCOUNTER — Encounter: Payer: Self-pay | Admitting: Interventional Cardiology

## 2013-08-15 ENCOUNTER — Ambulatory Visit (INDEPENDENT_AMBULATORY_CARE_PROVIDER_SITE_OTHER): Payer: Medicare Other | Admitting: Interventional Cardiology

## 2013-08-15 VITALS — BP 140/73 | HR 69 | Ht 71.0 in | Wt 255.8 lb

## 2013-08-15 DIAGNOSIS — I10 Essential (primary) hypertension: Secondary | ICD-10-CM

## 2013-08-15 DIAGNOSIS — I2510 Atherosclerotic heart disease of native coronary artery without angina pectoris: Secondary | ICD-10-CM

## 2013-08-15 DIAGNOSIS — R079 Chest pain, unspecified: Secondary | ICD-10-CM

## 2013-08-15 NOTE — Progress Notes (Signed)
Patient ID: Bryan Hatfield, male   DOB: 10-Oct-1926, 79 y.o.   MRN: [id number] Medical History: High blood pressure, High cholesterol, sleep apnea-CPAP, CAD with prior coronary stent, Abnormal myocardial perfusion study, possible small region of inferolateral redistribution 05/2012 treated medically.     [place]. [address], [address] [place], [place]  [address] Phone: [phone] Fax:  [phone]  Date:  08/15/2013   ID:  Bryan Hatfield, DOB Jul 15, 1926, MRN [id number]  PCP:  Marcial Pacas, DO   ASSESSMENT:   1. Coronary artery disease 2. Musculoskeletal chest pain  PLAN:  1. No specific cardiac evaluation. Coronary angiogram was done recently, within the past 12 months without evidence of significant obstruction. The current symptoms are not associated with any change in his baseline EKG.   SUBJECTIVE: Bryan Hatfield is a 80 y.o. male developed substernal/subxiphoid soreness in the sternum while exercising on a piece of equipment at the [facility] that required upper arm movement. The discomfort lasted for 2 hours last week. It has not recurred. It frightened him and he has not been back to exercise. He feels somewhat weak and fatigue. Otherwise no particular problems.   Wt Readings from Last 3 Encounters:  08/15/13 255 lb 12.8 oz (116.03 kg)  06/02/13 248 lb (112.492 kg)  10/25/12 250 lb (113.399 kg)     Past Medical History  Diagnosis Date  . CAD (coronary artery disease)     a. PTCA 2001. b. f/u cath 2008: mild nonobstructive CAD.  Marland Kitchen Hypertension   . Acute respiratory failure 09/25/11    hypoxic  . Peripheral vascular disease     patient denies knowledge of, but listed in his chart  . High cholesterol   . Anxiety   . Pleural effusion     Parapneumonic ?r/t PNA s/p thoracentesis 2008  . GERD (gastroesophageal reflux disease)   . Colon polyps   . COPD (chronic obstructive pulmonary disease)   . Pneumonia     hx  . Sleep apnea     cpap     Current Outpatient Prescriptions    Medication Sig Dispense Refill  . aspirin EC 81 MG tablet Take 81 mg by mouth daily.        . cholecalciferol (VITAMIN D) 1000 UNITS tablet Take 1,000 Units by mouth daily.      . furosemide (LASIX) 40 MG tablet Take 1 tablet (40 mg total) by mouth every other day. Usually takes on MWF  45 tablet  3  . losartan (COZAAR) 50 MG tablet Take 1 tablet (50 mg total) by mouth daily.  90 tablet  3  . Multiple Vitamins-Minerals (ICAPS MV PO) Take 1 capsule by mouth daily.      . nitroGLYCERIN (NITROSTAT) 0.4 MG SL tablet Place 1 tablet (0.4 mg total) under the tongue every 5 (five) minutes as needed for chest pain.  30 tablet  0  . sertraline (ZOLOFT) 100 MG tablet Take 1 tablet (100 mg total) by mouth daily.  90 tablet  3  . simvastatin (ZOCOR) 80 MG tablet Take one tablet every evening      . terazosin (HYTRIN) 2 MG capsule Take 1 capsule (2 mg total) by mouth at bedtime.  90 capsule  3  . traZODone (DESYREL) 50 MG tablet Take 1 tablet (50 mg total) by mouth at bedtime.  90 tablet  3  . triamcinolone cream (KENALOG) 0.1 % Apply to affected areas twice a day for up to two weeks, avoid face.  80  g  0   No current facility-administered medications for this visit.    Allergies:   No Known Allergies  Social History:  The patient  reports that he quit smoking about 31 years ago. His smoking use included Cigarettes. He has a 10 pack-year smoking history. He has never used smokeless tobacco. He reports that he does not drink alcohol or use illicit drugs.   ROS:  Please see the history of present illness.   Anxiety and weakness  All other systems reviewed and negative.   OBJECTIVE: VS:  BP 140/73  Pulse 69  Ht 5\' 11"  (1.803 m)  Wt 255 lb 12.8 oz (116.03 kg)  BMI 35.69 kg/m2 Well nourished, well developed, in no acute distress, obese HEENT: normal Neck: JVD flat. Carotid bruit absent  Cardiac:  normal S1, S2; RRR; no murmur Lungs:  clear to auscultation bilaterally, no wheezing, rhonchi or  rales Abd: soft, nontender, no hepatomegaly Ext: Edema absent. Pulses 2+ Skin: warm and dry Neuro:  CNs 2-12 intact, no focal abnormalities noted  EKG:  Normal sinus rhythm with left axis deviation unchanged from prior tracings       Signed, Illene Labrador III, MD 08/15/2013 3:42 PM

## 2013-08-15 NOTE — Patient Instructions (Signed)
Your physician recommends that you continue on your current medications as directed. Please refer to the Current Medication list given to you today.  Ok to resume full activity  Your physician wants you to follow-up in: 1 year You will receive a reminder letter in the mail two months in advance. If you don't receive a letter, please call our office to schedule the follow-up appointment.

## 2013-08-16 ENCOUNTER — Encounter: Payer: Self-pay | Admitting: Interventional Cardiology

## 2013-08-30 ENCOUNTER — Ambulatory Visit (INDEPENDENT_AMBULATORY_CARE_PROVIDER_SITE_OTHER): Payer: Medicare Other | Admitting: Family Medicine

## 2013-08-30 ENCOUNTER — Encounter: Payer: Self-pay | Admitting: Family Medicine

## 2013-08-30 VITALS — BP 126/68 | HR 69 | Wt 251.0 lb

## 2013-08-30 DIAGNOSIS — R7303 Prediabetes: Secondary | ICD-10-CM

## 2013-08-30 DIAGNOSIS — R059 Cough, unspecified: Secondary | ICD-10-CM

## 2013-08-30 DIAGNOSIS — I10 Essential (primary) hypertension: Secondary | ICD-10-CM

## 2013-08-30 DIAGNOSIS — R05 Cough: Secondary | ICD-10-CM

## 2013-08-30 DIAGNOSIS — R7309 Other abnormal glucose: Secondary | ICD-10-CM

## 2013-08-30 MED ORDER — MOMETASONE FUROATE 50 MCG/ACT NA SUSP: NASAL | Status: DC

## 2013-08-30 NOTE — Progress Notes (Signed)
CC: Bryan Hatfield is a 80 y.o. male is here for Hypertension   Subjective: HPI:  Followup prediabetes: Continues to work out most days of the week for 1-2 hours. Tries to watch what he eats with respect to carbohydrate intake. No unintentional weight loss nor gain since I seen him last. Denies polyuria polydipsia nor polyuria.  Followup essential hypertension: Continues to take losartan on a daily basis with no outside blood pressures to report. Denies exertional chest pain since I saw him last other than muscular pain that occurred when on a elliptical which was worked up and cleared by his cardiologist. He denies any chest pain whatsoever since that visit. Denies irregular heartbeat, orthopnea, peripheral edema nor shortness of breath  Complains of a cough that is nonproductive accompanied by nasal congestion that has been present for the past 3 days. Denies fevers, chills, wheezing, night sweats, dysphagia. Symptoms are present on a daily basis all hours of the day not interfering with sleep and mild in severity no interventions as of yet   Review Of Systems Outlined In HPI  Past Medical History  Diagnosis Date  . CAD (coronary artery disease)     a. PTCA 2001. b. f/u cath 2008: mild nonobstructive CAD.  Marland Kitchen Hypertension   . Acute respiratory failure 09/25/11    hypoxic  . Peripheral vascular disease     patient denies knowledge of, but listed in his chart  . High cholesterol   . Anxiety   . Pleural effusion     Parapneumonic ?r/t PNA s/p thoracentesis 2008  . GERD (gastroesophageal reflux disease)   . Colon polyps   . COPD (chronic obstructive pulmonary disease)   . Pneumonia     hx  . Sleep apnea     cpap     Past Surgical History  Procedure Laterality Date  . Hemorrhoid surgery  1960's  . Coronary angioplasty  01/2000  . Cataract extraction w/ intraocular lens  implant, bilateral  ~ 2006  . Cholecystectomy N/A 10/11/2012    Procedure: LAPAROSCOPIC CHOLECYSTECTOMY WITH  INTRAOPERATIVE CHOLANGIOGRAM;  Surgeon: Adin Hector, MD;  Location: [facility];  Service: General;  Laterality: N/A;   Family History  Problem Relation Age of Onset  . Stroke Mother   . Arthritis Mother   . Asthma Mother   . Emphysema Mother   . Arthritis Sister   . Stroke Sister   . Stroke Father     History   Social History  . Marital Status: Married    Spouse Name: N/A    Number of Children: N/A  . Years of Education: N/A   Occupational History  . Retired     Surveyor, quantity - Civil engineer, contracting and Record   Social History Main Topics  . Smoking status: Former Smoker -- 1.00 packs/day for 10 years    Types: Cigarettes    Quit date: 06/01/1982  . Smokeless tobacco: Never Used     Comment: "quit smoking ~ 25 years ago; smoked ~ 1 pk/wk"  . Alcohol Use: No  . Drug Use: No  . Sexual Activity: No   Other Topics Concern  . Not on file   Social History Narrative  . No narrative on file     Objective: BP 126/68  Pulse 69  Wt 251 lb (113.853 kg)  General: Alert and Oriented, No Acute Distress HEENT: Pupils equal, round, reactive to light. Conjunctivae clear.  External ears unremarkable, canals clear with intact TMs with appropriate landmarks.  Middle ear appears  open without effusion. Pink inferior turbinates.  Moist mucous membranes, pharynx without inflammation nor lesions.  Neck supple without palpable lymphadenopathy nor abnormal masses. Lungs: Clear to auscultation bilaterally, no wheezing/ronchi/rales.  Comfortable work of breathing. Good air movement. Cardiac: Regular rate and rhythm. Normal S1/S2.  No murmurs, rubs, nor gallops.   Extremities: No peripheral edema.  Strong peripheral pulses.  Mental Status: No depression, anxiety, nor agitation. Skin: Warm and dry.  Assessment & Plan: Bryan Hatfield was seen today for hypertension.  Diagnoses and associated orders for this visit:  Essential hypertension, benign  Prediabetes - Hemoglobin A1c  Cough  Other Orders - mometasone  (NASONEX) 50 MCG/ACT nasal spray; Two sprays each nostril daily.    Essential hypertension: Controlled continue on losartan Prediabetes: Clinically controlled due for repeat A1c Cough: This is most likely due to allergies therefore start one month sample of Nasonex   Return in about 3 months (around 11/29/2013).

## 2013-08-31 LAB — HEMOGLOBIN A1C
HEMOGLOBIN A1C: 5.9 % — AB (ref <5.7)
Mean Plasma Glucose: 123 mg/dL — ABNORMAL HIGH (ref <117)

## 2013-09-21 ENCOUNTER — Encounter: Payer: Self-pay | Admitting: Family Medicine

## 2013-09-21 ENCOUNTER — Ambulatory Visit (INDEPENDENT_AMBULATORY_CARE_PROVIDER_SITE_OTHER): Payer: Medicare Other | Admitting: Family Medicine

## 2013-09-21 VITALS — BP 114/58 | HR 78 | Temp 98.3°F | Wt 256.0 lb

## 2013-09-21 DIAGNOSIS — K5732 Diverticulitis of large intestine without perforation or abscess without bleeding: Secondary | ICD-10-CM

## 2013-09-21 MED ORDER — AMOXICILLIN-POT CLAVULANATE 875-125 MG PO TABS
1.0000 | ORAL_TABLET | Freq: Two times a day (BID) | ORAL | Status: DC
Start: 2013-09-21 — End: 2013-11-27

## 2013-09-21 NOTE — Progress Notes (Signed)
CC: Bryan Hatfield is a 80 y.o. male is here for Abdominal Pain   Subjective: HPI:  Complains of abdominal pain moderate severity there has been present since Tuesday with a gradual onset during that day. Described only as pain it is nonradiating. Localizes it in the middle of the abdomen. It is worse when standing or when his abdomen is shaking however there is no other exertional component to his pain. Slightly improved with sitting in his recliner. Nothing else particularly makes better or worse. Bowel movements have been normal up until this morning he had a loose watery bowel movement without melena nor blood. No changed appetite he continues to be 3 meals a day without nausea. Diet is not influenced pain. Defecation does not influence pain. Denies back pain, flank pain, fevers, chills, dysuria. States that pain does not awaken him from sleep. One year ago he had an abdominal ultrasound showing a normal aorta.   Review Of Systems Outlined In HPI  Past Medical History  Diagnosis Date  . CAD (coronary artery disease)     a. PTCA 2001. b. f/u cath 2008: mild nonobstructive CAD.  Marland Kitchen Hypertension   . Acute respiratory failure 09/25/11    hypoxic  . Peripheral vascular disease     patient denies knowledge of, but listed in his chart  . High cholesterol   . Anxiety   . Pleural effusion     Parapneumonic ?r/t PNA s/p thoracentesis 2008  . GERD (gastroesophageal reflux disease)   . Colon polyps   . COPD (chronic obstructive pulmonary disease)   . Pneumonia     hx  . Sleep apnea     cpap     Past Surgical History  Procedure Laterality Date  . Hemorrhoid surgery  1960's  . Coronary angioplasty  01/2000  . Cataract extraction w/ intraocular lens  implant, bilateral  ~ 2006  . Cholecystectomy N/A 10/11/2012    Procedure: LAPAROSCOPIC CHOLECYSTECTOMY WITH INTRAOPERATIVE CHOLANGIOGRAM;  Surgeon: Adin Hector, MD;  Location: [facility];  Service: General;  Laterality: N/A;   Family History   Problem Relation Age of Onset  . Stroke Mother   . Arthritis Mother   . Asthma Mother   . Emphysema Mother   . Arthritis Sister   . Stroke Sister   . Stroke Father     History   Social History  . Marital Status: Married    Spouse Name: N/A    Number of Children: N/A  . Years of Education: N/A   Occupational History  . Retired     Surveyor, quantity - Civil engineer, contracting and Record   Social History Main Topics  . Smoking status: Former Smoker -- 1.00 packs/day for 10 years    Types: Cigarettes    Quit date: 06/01/1982  . Smokeless tobacco: Never Used     Comment: "quit smoking ~ 25 years ago; smoked ~ 1 pk/wk"  . Alcohol Use: No  . Drug Use: No  . Sexual Activity: No   Other Topics Concern  . Not on file   Social History Narrative  . No narrative on file     Objective: BP 114/58  Pulse 78  Temp(Src) 98.3 F (36.8 C) (Oral)  Wt 256 lb (116.121 kg)  General: Alert and Oriented, No Acute Distress HEENT: Pupils equal, round, reactive to light. Conjunctivae clear.  Moist membranes pharynx unremarkable Lungs: Clear to auscultation bilaterally, no wheezing/ronchi/rales.  Comfortable work of breathing. Good air movement. Cardiac: Regular rate and rhythm. Normal S1/S2.  No murmurs, rubs, nor gallops.   Abdomen: Obese with normal bowel sounds. Mild pain reproduction with palpation of right lower quadrant without guarding nor rebound. Pain is 100% reproduced with palpation of the left lower quadrant with mild rebound, there is guarding in this quadrant. Otherwise abdominal pain not reproducible with palpation. No palpable masses Extremities: No peripheral edema.  Strong peripheral pulses.  Mental Status: No depression, anxiety, nor agitation. Skin: Warm and dry.  Assessment & Plan: [name] was seen today for abdominal pain.  Diagnoses and associated orders for this visit:  Diverticulitis of colon without hemorrhage - amoxicillin-clavulanate (AUGMENTIN) 875-125 MG per tablet; Take 1 tablet by  mouth 2 (two) times daily.    Suspect mild diverticulitis, given unremarkable vitals and his overall appearance of comfort we'll hold off on CT scan unless unresponsive to 48 hours of Augmentin. Discussed bland diet.Signs and symptoms requring emergent/urgent reevaluation were discussed with the patient.  Return if symptoms worsen or fail to improve.

## 2013-09-23 ENCOUNTER — Encounter (HOSPITAL_COMMUNITY): Payer: Self-pay | Admitting: Emergency Medicine

## 2013-09-23 ENCOUNTER — Emergency Department (HOSPITAL_COMMUNITY): Payer: Medicare Other

## 2013-09-23 ENCOUNTER — Other Ambulatory Visit (HOSPITAL_COMMUNITY): Payer: Medicare Other

## 2013-09-23 ENCOUNTER — Emergency Department (HOSPITAL_COMMUNITY)
Admission: EM | Admit: 2013-09-23 | Discharge: 2013-09-23 | Disposition: A | Discharge status: Home / Self Care | Payer: Medicare Other | Attending: Emergency Medicine | Admitting: Emergency Medicine

## 2013-09-23 DIAGNOSIS — Z792 Long term (current) use of antibiotics: Secondary | ICD-10-CM | POA: Insufficient documentation

## 2013-09-23 DIAGNOSIS — Z87891 Personal history of nicotine dependence: Secondary | ICD-10-CM | POA: Insufficient documentation

## 2013-09-23 DIAGNOSIS — Z9981 Dependence on supplemental oxygen: Secondary | ICD-10-CM | POA: Insufficient documentation

## 2013-09-23 DIAGNOSIS — Z79899 Other long term (current) drug therapy: Secondary | ICD-10-CM | POA: Insufficient documentation

## 2013-09-23 DIAGNOSIS — J449 Chronic obstructive pulmonary disease, unspecified: Secondary | ICD-10-CM | POA: Insufficient documentation

## 2013-09-23 DIAGNOSIS — K5792 Diverticulitis of intestine, part unspecified, without perforation or abscess without bleeding: Secondary | ICD-10-CM

## 2013-09-23 DIAGNOSIS — F411 Generalized anxiety disorder: Secondary | ICD-10-CM | POA: Insufficient documentation

## 2013-09-23 DIAGNOSIS — R197 Diarrhea, unspecified: Secondary | ICD-10-CM | POA: Insufficient documentation

## 2013-09-23 DIAGNOSIS — Z7982 Long term (current) use of aspirin: Secondary | ICD-10-CM | POA: Insufficient documentation

## 2013-09-23 DIAGNOSIS — Z8701 Personal history of pneumonia (recurrent): Secondary | ICD-10-CM | POA: Insufficient documentation

## 2013-09-23 DIAGNOSIS — G4730 Sleep apnea, unspecified: Secondary | ICD-10-CM | POA: Insufficient documentation

## 2013-09-23 DIAGNOSIS — I10 Essential (primary) hypertension: Secondary | ICD-10-CM | POA: Insufficient documentation

## 2013-09-23 DIAGNOSIS — E7800 Pure hypercholesterolemia, unspecified: Secondary | ICD-10-CM | POA: Insufficient documentation

## 2013-09-23 DIAGNOSIS — Z860100 Personal history of colon polyps, unspecified: Secondary | ICD-10-CM | POA: Insufficient documentation

## 2013-09-23 DIAGNOSIS — Z9861 Coronary angioplasty status: Secondary | ICD-10-CM | POA: Insufficient documentation

## 2013-09-23 DIAGNOSIS — IMO0002 Reserved for concepts with insufficient information to code with codable children: Secondary | ICD-10-CM | POA: Insufficient documentation

## 2013-09-23 DIAGNOSIS — J4489 Other specified chronic obstructive pulmonary disease: Secondary | ICD-10-CM | POA: Insufficient documentation

## 2013-09-23 DIAGNOSIS — I2510 Atherosclerotic heart disease of native coronary artery without angina pectoris: Secondary | ICD-10-CM | POA: Insufficient documentation

## 2013-09-23 DIAGNOSIS — K5732 Diverticulitis of large intestine without perforation or abscess without bleeding: Secondary | ICD-10-CM | POA: Insufficient documentation

## 2013-09-23 LAB — URINALYSIS, ROUTINE W REFLEX MICROSCOPIC
Bilirubin Urine: NEGATIVE
GLUCOSE, UA: NEGATIVE mg/dL
Hgb urine dipstick: NEGATIVE
Ketones, ur: NEGATIVE mg/dL
Leukocytes, UA: NEGATIVE
NITRITE: NEGATIVE
PH: 7.5 (ref 5.0–8.0)
Protein, ur: NEGATIVE mg/dL
Specific Gravity, Urine: 1.02 (ref 1.005–1.030)
Urobilinogen, UA: 1 mg/dL (ref 0.0–1.0)

## 2013-09-23 LAB — COMPREHENSIVE METABOLIC PANEL
ALBUMIN: 3.1 g/dL — AB (ref 3.5–5.2)
ALT: 22 U/L (ref 0–53)
AST: 22 U/L (ref 0–37)
Alkaline Phosphatase: 82 U/L (ref 39–117)
BUN: 19 mg/dL (ref 6–23)
CO2: 26 mEq/L (ref 19–32)
CREATININE: 0.82 mg/dL (ref 0.50–1.35)
Calcium: 9.2 mg/dL (ref 8.4–10.5)
Chloride: 101 mEq/L (ref 96–112)
GFR calc Af Amer: >90 mL/min (ref >90)
GFR calc non Af Amer: 78 mL/min — ABNORMAL LOW (ref >90)
Glucose, Bld: 128 mg/dL — ABNORMAL HIGH (ref 70–99)
Potassium: 4.3 mEq/L (ref 3.7–5.3)
Sodium: 139 mEq/L (ref 137–147)
Total Bilirubin: 0.4 mg/dL (ref 0.3–1.2)
Total Protein: 6.9 g/dL (ref 6.0–8.3)

## 2013-09-23 LAB — CBC WITH DIFFERENTIAL/PLATELET
BASOS ABS: 0 10*3/uL (ref 0.0–0.1)
BASOS PCT: 0 % (ref 0–1)
EOS PCT: 4 % (ref 0–5)
Eosinophils Absolute: 0.2 10*3/uL (ref 0.0–0.7)
HEMATOCRIT: 38 % — AB (ref 39.0–52.0)
Hemoglobin: 12.7 g/dL — ABNORMAL LOW (ref 13.0–17.0)
Lymphocytes Relative: 22 % (ref 12–46)
Lymphs Abs: 1.3 10*3/uL (ref 0.7–4.0)
MCH: 31.8 pg (ref 26.0–34.0)
MCHC: 33.4 g/dL (ref 30.0–36.0)
MCV: 95 fL (ref 78.0–100.0)
MONO ABS: 0.6 10*3/uL (ref 0.1–1.0)
Monocytes Relative: 9 % (ref 3–12)
Neutro Abs: 4 10*3/uL (ref 1.7–7.7)
Neutrophils Relative %: 65 % (ref 43–77)
Platelets: 140 10*3/uL — ABNORMAL LOW (ref 150–400)
RBC: 4 MIL/uL — ABNORMAL LOW (ref 4.22–5.81)
RDW: 13.2 % (ref 11.5–15.5)
WBC: 6.1 10*3/uL (ref 4.0–10.5)

## 2013-09-23 LAB — LIPASE, BLOOD: LIPASE: 25 U/L (ref 11–59)

## 2013-09-23 LAB — I-STAT CG4 LACTIC ACID, ED: LACTIC ACID, VENOUS: 1.46 mmol/L (ref 0.5–2.2)

## 2013-09-23 MED ORDER — METRONIDAZOLE IN NACL 5-0.79 MG/ML-% IV SOLN
500.0000 mg | Freq: Once | INTRAVENOUS | Status: AC
  Administered 2013-09-23: 500 mg via INTRAVENOUS
  Filled 2013-09-23: qty 100

## 2013-09-23 MED ORDER — CIPROFLOXACIN HCL 500 MG PO TABS: 500.0000 mg | ORAL_TABLET | Freq: Two times a day (BID) | ORAL | Status: DC

## 2013-09-23 MED ORDER — MORPHINE SULFATE 4 MG/ML IJ SOLN
4.0000 mg | Freq: Once | INTRAMUSCULAR | Status: AC
  Administered 2013-09-23: 4 mg via INTRAVENOUS
  Filled 2013-09-23: qty 1

## 2013-09-23 MED ORDER — METRONIDAZOLE 500 MG PO TABS: 500.0000 mg | ORAL_TABLET | Freq: Two times a day (BID) | ORAL | Status: DC

## 2013-09-23 MED ORDER — HYDROCODONE-ACETAMINOPHEN 5-325 MG PO TABS: 1.0000 | ORAL_TABLET | Freq: Four times a day (QID) | ORAL | Status: DC | PRN

## 2013-09-23 MED ORDER — SODIUM CHLORIDE 0.9 % IV SOLN
1000.0000 mL | INTRAVENOUS | Status: DC
Start: 2013-09-23 — End: 2013-09-23
  Administered 2013-09-23: 1000 mL via INTRAVENOUS

## 2013-09-23 MED ORDER — IOHEXOL 300 MG/ML  SOLN
25.0000 mL | INTRAMUSCULAR | Status: AC
  Administered 2013-09-23: 25 mL via ORAL

## 2013-09-23 MED ORDER — CIPROFLOXACIN IN D5W 400 MG/200ML IV SOLN
400.0000 mg | Freq: Once | INTRAVENOUS | Status: AC
  Administered 2013-09-23: 400 mg via INTRAVENOUS
  Filled 2013-09-23: qty 200

## 2013-09-23 MED ORDER — ONDANSETRON HCL 4 MG/2ML IJ SOLN
4.0000 mg | Freq: Once | INTRAMUSCULAR | Status: AC
  Administered 2013-09-23: 4 mg via INTRAVENOUS
  Filled 2013-09-23: qty 2

## 2013-09-23 NOTE — ED Notes (Signed)
NOTIFIED DR. LAOKWOOD OF PATIENTS LAB RESULTS OF CG4+ LACTIC ACID @12 :50 PM  ,09/23/2013.

## 2013-09-23 NOTE — ED Notes (Signed)
Patient asked to give urine sample. 

## 2013-09-23 NOTE — Discharge Instructions (Signed)
As discussed, it is important that you follow up as soon as possible with your physician for continued management of your condition.  If you develop any new, or concerning changes in your condition, please return to the emergency department immediately.  Diverticulitis A diverticulum is a small pouch or sac on the colon. Diverticulosis is the presence of these diverticula on the colon. Diverticulitis is the irritation (inflammation) or infection of diverticula. CAUSES  The colon and its diverticula contain bacteria. If food particles block the tiny opening to a diverticulum, the bacteria inside can grow and cause an increase in pressure. This leads to infection and inflammation and is called diverticulitis. SYMPTOMS   Abdominal pain and tenderness. Usually, the pain is located on the left side of your abdomen. However, it could be located elsewhere.  Fever.  Bloating.  Feeling sick to your stomach (nausea).  Throwing up (vomiting).  Abnormal stools. DIAGNOSIS  Your caregiver will take a history and perform a physical exam. Since many things can cause abdominal pain, other tests may be necessary. Tests may include:  Blood tests.  Urine tests.  X-ray of the abdomen.  CT scan of the abdomen. Sometimes, surgery is needed to determine if diverticulitis or other conditions are causing your symptoms. TREATMENT  Most of the time, you can be treated without surgery. Treatment includes:  Resting the bowels by only having liquids for a few days. As you improve, you will need to eat a low-fiber diet.  Intravenous (IV) fluids if you are losing body fluids (dehydrated).  Antibiotic medicines that treat infections may be given.  Pain and nausea medicine, if needed.  Surgery if the inflamed diverticulum has burst. HOME CARE INSTRUCTIONS   Try a clear liquid diet (broth, tea, or water for as long as directed by your caregiver). You may then gradually begin a low-fiber diet as tolerated.    A low-fiber diet is a diet with less than 10 grams of fiber. Choose the foods below to reduce fiber in the diet:  White breads, cereals, rice, and pasta.  Cooked fruits and vegetables or soft fresh fruits and vegetables without the skin.  Ground or well-cooked tender beef, ham, veal, lamb, pork, or poultry.  Eggs and seafood.  After your diverticulitis symptoms have improved, your caregiver may put you on a high-fiber diet. A high-fiber diet includes 14 grams of fiber for every 1000 calories consumed. For a standard 2000 calorie diet, you would need 28 grams of fiber. Follow these diet guidelines to help you increase the fiber in your diet. It is important to slowly increase the amount fiber in your diet to avoid gas, constipation, and bloating.  Choose whole-grain breads, cereals, pasta, and brown rice.  Choose fresh fruits and vegetables with the skin on. Do not overcook vegetables because the more vegetables are cooked, the more fiber is lost.  Choose more nuts, seeds, legumes, dried peas, beans, and lentils.  Look for food products that have greater than 3 grams of fiber per serving on the Nutrition Facts label.  Take all medicine as directed by your caregiver.  If your caregiver has given you a follow-up appointment, it is very important that you go. Not going could result in lasting (chronic) or permanent injury, pain, and disability. If there is any problem keeping the appointment, call to reschedule. SEEK MEDICAL CARE IF:   Your pain does not improve.  You have a hard time advancing your diet beyond clear liquids.  Your bowel movements do not  return to normal. SEEK IMMEDIATE MEDICAL CARE IF:   Your pain becomes worse.  You have an oral temperature above 102 F (38.9 C), not controlled by medicine.  You have repeated vomiting.  You have bloody or black, tarry stools.  Symptoms that brought you to your caregiver become worse or are not getting better. MAKE SURE YOU:    Understand these instructions.  Will watch your condition.  Will get help right away if you are not doing well or get worse. Document Released: 02/25/2005 Document Revised: 08/10/2011 Document Reviewed: 06/23/2010 [facility] Patient Information 2014 [place].

## 2013-09-23 NOTE — ED Provider Notes (Signed)
CSN: [id number]     Arrival date & time 09/23/13  1149 History   First MD Initiated Contact with Patient 09/23/13 1159     Chief Complaint  Patient presents with  . Abdominal Pain     (Consider location/radiation/quality/duration/timing/severity/associated sxs/prior Treatment) HPI Patient presents with abdominal pain.  Pain began 4 days ago.  Since onset he has had pain focally in the left lower quadrant. There has been new in his stool, but no vomiting, no nausea, no fever, no chills. Pain is nonradiating, sore. The patient saw his physician, was started on an unknown antibiotic, but no new pain medication. No change in his condition with antibiotic use.  Past Medical History  Diagnosis Date  . CAD (coronary artery disease)     a. PTCA 2001. b. f/u cath 2008: mild nonobstructive CAD.  Marland Kitchen Hypertension   . Acute respiratory failure 09/25/11    hypoxic  . Peripheral vascular disease     patient denies knowledge of, but listed in his chart  . High cholesterol   . Anxiety   . Pleural effusion     Parapneumonic ?r/t PNA s/p thoracentesis 2008  . GERD (gastroesophageal reflux disease)   . Colon polyps   . COPD (chronic obstructive pulmonary disease)   . Pneumonia     hx  . Sleep apnea     cpap    Past Surgical History  Procedure Laterality Date  . Hemorrhoid surgery  1960's  . Coronary angioplasty  01/2000  . Cataract extraction w/ intraocular lens  implant, bilateral  ~ 2006  . Cholecystectomy N/A 10/11/2012    Procedure: LAPAROSCOPIC CHOLECYSTECTOMY WITH INTRAOPERATIVE CHOLANGIOGRAM;  Surgeon: Adin Hector, MD;  Location: [facility];  Service: General;  Laterality: N/A;   Family History  Problem Relation Age of Onset  . Stroke Mother   . Arthritis Mother   . Asthma Mother   . Emphysema Mother   . Arthritis Sister   . Stroke Sister   . Stroke Father    History  Substance Use Topics  . Smoking status: Former Smoker -- 1.00 packs/day for 10 years    Types: Cigarettes    Quit date: 06/01/1982  . Smokeless tobacco: Never Used     Comment: "quit smoking ~ 25 years ago; smoked ~ 1 pk/wk"  . Alcohol Use: No    Review of Systems  Constitutional:       Per HPI, otherwise negative  HENT:       Per HPI, otherwise negative  Respiratory:       Per HPI, otherwise negative  Cardiovascular:       Per HPI, otherwise negative  Gastrointestinal: Positive for diarrhea. Negative for vomiting and blood in stool.  Endocrine:       Negative aside from HPI  Genitourinary:       Neg aside from HPI   Musculoskeletal:       Per HPI, otherwise negative  Skin: Negative.   Neurological: Negative for syncope.      Allergies  Review of patient's allergies indicates no known allergies.  Home Medications   Prior to Admission medications   Medication Sig Start Date End Date Taking? Authorizing Provider  amoxicillin-clavulanate (AUGMENTIN) 875-125 MG per tablet Take 1 tablet by mouth 2 (two) times daily. 09/21/13   Marcial Pacas, DO  aspirin EC 81 MG tablet Take 81 mg by mouth daily.      Historical Provider, MD  cholecalciferol (VITAMIN D) 1000 UNITS tablet Take 1,000 Units by  mouth daily.    Historical Provider, MD  furosemide (LASIX) 40 MG tablet Take 1 tablet (40 mg total) by mouth every other day. Usually takes on MWF 06/02/13   Sean Hommel, DO  losartan (COZAAR) 50 MG tablet Take 1 tablet (50 mg total) by mouth daily. 06/02/13   Sean Hommel, DO  mometasone (NASONEX) 50 MCG/ACT nasal spray Two sprays each nostril daily. 08/30/13   Marcial Pacas, DO  Multiple Vitamins-Minerals (ICAPS MV PO) Take 1 capsule by mouth daily.    Historical Provider, MD  nitroGLYCERIN (NITROSTAT) 0.4 MG SL tablet Place 1 tablet (0.4 mg total) under the tongue every 5 (five) minutes as needed for chest pain. 06/02/13   Sean Hommel, DO  sertraline (ZOLOFT) 100 MG tablet Take 1 tablet (100 mg total) by mouth daily. 06/02/13   Marcial Pacas, DO  simvastatin (ZOCOR) 80 MG tablet Take one tablet every evening  06/02/13   Marcial Pacas, DO  terazosin (HYTRIN) 2 MG capsule Take 1 capsule (2 mg total) by mouth at bedtime. 06/02/13   Marcial Pacas, DO  traZODone (DESYREL) 50 MG tablet Take 1 tablet (50 mg total) by mouth at bedtime. 06/02/13   Marcial Pacas, DO  triamcinolone cream (KENALOG) 0.1 % Apply to affected areas twice a day for up to two weeks, avoid face. 06/02/13 06/02/14  Sean Hommel, DO   BP 122/89  Pulse 77  Temp(Src) 97.7 F (36.5 C) (Oral)  Resp 16  Wt 256 lb 9.6 oz (116.393 kg)  SpO2 100% Physical Exam  Nursing note and vitals reviewed. Constitutional: He is oriented to person, place, and time. He appears well-developed. No distress.  HENT:  Head: Normocephalic and atraumatic.  Eyes: Conjunctivae and EOM are normal.  Cardiovascular: Normal rate and regular rhythm.   Pulmonary/Chest: Effort normal. No stridor. No respiratory distress.  Abdominal: Soft. Normal appearance. He exhibits no distension. There is tenderness in the left lower quadrant. There is guarding. There is no rigidity.  Musculoskeletal: He exhibits no edema.  Neurological: He is alert and oriented to person, place, and time.  Skin: Skin is warm and dry.  Psychiatric: He has a normal mood and affect.    ED Course  Procedures (including critical care time) Labs Review Labs Reviewed  CBC WITH DIFFERENTIAL - Abnormal; Notable for the following:    RBC 4.00 (*)    Hemoglobin 12.7 (*)    HCT 38.0 (*)    Platelets 140 (*)    All other components within normal limits  COMPREHENSIVE METABOLIC PANEL - Abnormal; Notable for the following:    Glucose, Bld 128 (*)    Albumin 3.1 (*)    GFR calc non Af Amer 78 (*)    All other components within normal limits  LIPASE, BLOOD  URINALYSIS, ROUTINE W REFLEX MICROSCOPIC  I-STAT CG4 LACTIC ACID, ED    Imaging Review Ct Abdomen Pelvis Wo Contrast  09/23/2013   CLINICAL DATA:  Left lower quadrant pain.  EXAM: CT ABDOMEN AND PELVIS WITHOUT CONTRAST  TECHNIQUE: Multidetector CT imaging  of the abdomen and pelvis was performed following the standard protocol without intravenous contrast.  COMPARISON:  Abdominal MRI 09/26/2012  FINDINGS: Mild subpleural opacity in the lung bases may reflect scarring, atelectasis or possibly very mild fibrosis. No pleural effusion is seen.  The gallbladder is surgically absent. Common bile duct measures up to approximately 1 cm in size, larger than on the prior MRCP and may be secondary to interval cholecystectomy. The liver, spleen, adrenal glands, kidneys,  and pancreas have an unremarkable unenhanced appearance.  Small hiatal hernia is noted. Oral contrast is present in multiple nondilated loops of small bowel. There is colonic diverticulosis with evidence of diverticulitis in the region of the distal descending colon/ proximal sigmoid colon. Moderate surrounding inflammatory changes are present. No frank perforation is identified. No fluid collection is present.  Bladder is grossly unremarkable. Prostate is mildly enlarged. Small bilateral fat containing inguinal hernias are present, left larger than right. Moderate to advanced atherosclerotic aortic calcification is present. No free fluid or enlarged lymph nodes are identified. No multilevel degenerative disc disease is present in the lumbar spine. Multilevel osteophytosis is present in the visualized lower thoracic spine.  IMPRESSION: Left lower quadrant diverticulitis.  No abscess.   Electronically Signed   By: Logan Bores   On: 09/23/2013 15:32     After the initial evaluation I reviewed the patient's emr   3:44 PM Patient resting, smiling post-morphine   6:57 PM Patient has completed therapy with ciprofloxacin, Flagyl for diverticulitis. Vital signs are stable, he has no complaints. We discussed the need for antibiotics as an outpatient, GI followup.  Patient will be discharged. MDM  This elderly male presents with ongoing abdominal pain.  On exam patient is awake alert, afebrile.  Patient's  evaluation demonstrates diverticulitis, no abscess. Patient completed initial antibiotics here, was discharged in stable condition to follow up with his gastroenterologist.    Carmin Muskrat, MD 09/23/13 [phone]

## 2013-09-23 NOTE — ED Notes (Signed)
Pt. Stated, I have this pain on the left side onset over a week ago.  I went to Dr. On Thursday and he gave me an antibiotic and Im not better.

## 2013-09-23 NOTE — ED Notes (Signed)
Pt completed CT contrast/CT called and notified

## 2013-09-27 ENCOUNTER — Telehealth: Payer: Self-pay | Admitting: Family Medicine

## 2013-09-27 NOTE — Telephone Encounter (Signed)
Yes, ok with me 

## 2013-09-27 NOTE — Telephone Encounter (Signed)
Fine with me

## 2013-09-27 NOTE — Telephone Encounter (Signed)
Patient's wife called stated she seen Dr. Madilyn Fireman this week and request to have her husband see Dr. Madilyn Fireman as his primary care doctor. They just want to have the same doctor and she states Dr. Madilyn Fireman said okay. Patient has an appointment scheduled for June 29th w/ hommel I just canceled but sending a telephone call to advise of the change provider request before I scheduled an appointment with Dr. Madilyn Fireman. Thanks

## 2013-10-16 ENCOUNTER — Other Ambulatory Visit: Payer: Self-pay | Admitting: Family Medicine

## 2013-11-27 ENCOUNTER — Ambulatory Visit: Payer: Medicare Other | Admitting: Family Medicine

## 2013-11-27 ENCOUNTER — Encounter: Payer: Self-pay | Admitting: Physician Assistant

## 2013-11-27 ENCOUNTER — Ambulatory Visit (INDEPENDENT_AMBULATORY_CARE_PROVIDER_SITE_OTHER): Payer: Medicare Other | Admitting: Physician Assistant

## 2013-11-27 VITALS — BP 111/58 | HR 69 | Ht 71.0 in | Wt 255.0 lb

## 2013-11-27 DIAGNOSIS — J209 Acute bronchitis, unspecified: Secondary | ICD-10-CM

## 2013-11-27 DIAGNOSIS — J441 Chronic obstructive pulmonary disease with (acute) exacerbation: Secondary | ICD-10-CM

## 2013-11-27 MED ORDER — PREDNISONE 50 MG PO TABS: ORAL_TABLET | ORAL | Status: DC

## 2013-11-27 MED ORDER — HYDROCOD POLST-CHLORPHEN POLST 10-8 MG/5ML PO LQCR: 5.0000 mL | Freq: Two times a day (BID) | ORAL | Status: DC | PRN

## 2013-11-27 NOTE — Progress Notes (Signed)
   Subjective:    Patient ID: Bryan Hatfield, male    DOB: 02-01-1927, 80 y.o.   MRN: [id number]  HPI Patient is an 79 year old male who presents to the clinic with his wife to followup on ongoing cough and shortness of breath. [facility] [place] [facility] 11/19/2013 in 11/24/2013. There was a chest x-ray done that revealed no pneumonia. Patient was originally placed on Z-Pak and then when he went back placed on amoxicillin and Hycodan for cough. He does have approximately 7 days left of amoxicillin. He is feeling some better. He continues to have his cough and feel like his chest is tight. He was not placed on any prednisone. He does have a history of COPD. The cough syrup has helped some but not completely. He still does not get a lot of rest at night. He does not have an albuterol inhaler at home. He does see Dr. Joya Gaskins for his COPD management. .    Review of Systems  All other systems reviewed and are negative.      Objective:   Physical Exam  Constitutional: He is oriented to person, place, and time. He appears well-developed and well-nourished.  HENT:  Head: Normocephalic and atraumatic.  Cardiovascular: Normal rate, regular rhythm and normal heart sounds.   Pulmonary/Chest:  Coarse breath sounds bilaterally. Patient is able to complete sentences without complication. No wheezing, rhonchi or crackles heard on today's exam.  Neurological: He is alert and oriented to person, place, and time.  Skin: Skin is dry.  Psychiatric: He has a normal mood and affect. His behavior is normal.          Assessment & Plan:  COPD exacerbation/acute bronchitis- I do think patient would benefit from prednisone. He was given prednisone for 5 days. I do think patient is responding to antibiotic. Finish amoxicillin. Patient does not have a rescue inhaler. He states this is because of cost. He was given a free sample of proair respiclick. I demonstrated how to use in his first 2 puffs  and done in office today. tussinex was given for cough at bedtime. Discussed sedation SE. Tessalon pearles given for during day usuage. Followup with Dr. Charise Carwin in the next week for recheck.

## 2013-11-27 NOTE — Patient Instructions (Addendum)
Pro-air as needed every 4-6 hours.  Continue and finish amoxicillin.  Start prednisone for 5 days.  tussinonex if needed.

## 2013-11-28 ENCOUNTER — Ambulatory Visit: Payer: Medicare Other | Admitting: Family Medicine

## 2013-12-04 ENCOUNTER — Other Ambulatory Visit: Payer: Self-pay | Admitting: Family Medicine

## 2013-12-07 ENCOUNTER — Ambulatory Visit: Payer: Medicare Other | Admitting: Family Medicine

## 2013-12-18 ENCOUNTER — Ambulatory Visit (INDEPENDENT_AMBULATORY_CARE_PROVIDER_SITE_OTHER): Payer: Medicare Other | Admitting: Family Medicine

## 2013-12-18 ENCOUNTER — Encounter: Payer: Self-pay | Admitting: Family Medicine

## 2013-12-18 ENCOUNTER — Telehealth: Payer: Self-pay | Admitting: *Deleted

## 2013-12-18 VITALS — BP 116/58 | HR 80 | Ht 71.0 in | Wt 255.0 lb

## 2013-12-18 DIAGNOSIS — J449 Chronic obstructive pulmonary disease, unspecified: Secondary | ICD-10-CM

## 2013-12-18 DIAGNOSIS — I10 Essential (primary) hypertension: Secondary | ICD-10-CM

## 2013-12-18 DIAGNOSIS — R413 Other amnesia: Secondary | ICD-10-CM

## 2013-12-18 DIAGNOSIS — G4730 Sleep apnea, unspecified: Secondary | ICD-10-CM

## 2013-12-18 MED ORDER — BUDESONIDE-FORMOTEROL FUMARATE 160-4.5 MCG/ACT IN AERO: 2.0000 | INHALATION_SPRAY | Freq: Two times a day (BID) | RESPIRATORY_TRACT | Status: DC

## 2013-12-18 NOTE — Progress Notes (Signed)
   Subjective:    Patient ID: Bryan Hatfield, male    DOB: 10/31/1926, 80 y.o.   MRN: [id number]  HPI F/U COPD bronchitis.  Was tx w/ antibiotic and steroids about 3 weeks ago. She was feeling much better.  Last seen by pulmonary 5/14.  Was supposed to f/u in 4 months.  He has been off his symbicort for months.    Hypertension- Pt denies chest pain, SOB, dizziness, or heart palpitations.  Taking meds as directed w/o problems.  Denies medication side effects.    Also here because his wife would like for him to have some memory testing done today. She has some concerns about his memory and scheduled him today for evaluation. He has no prior history of stroke but does have a history of coronary artery disease. No hx of dementia.    Review of Systems     Objective:   Physical Exam  Constitutional: He is oriented to person, place, and time. He appears well-developed and well-nourished.  HENT:  Head: Normocephalic and atraumatic.  Cardiovascular: Normal rate, regular rhythm and normal heart sounds.   Pulmonary/Chest: Effort normal and breath sounds normal.  Neurological: He is alert and oriented to person, place, and time.  Skin: Skin is warm and dry.  Psychiatric: He has a normal mood and affect. His behavior is normal.          Assessment & Plan:  COPD - Stable. His is not on his symbicort currently.  Will work on getting him back in with Dr. Joya Gaskins in [facility].  Sample provided.    HTN - WEll controlled on current regimen.  F/U in 3 months.    Memory changes - Score of 28/30.  Passing based on his education and age is 74/30.  Unable to draw clock properly in that he added a 0., o/w normal.  Also -1 for "no ifs, ands or buts".  Also -1 of  Update Tdap on chart. He wil try to get complete records from the [place] form em

## 2013-12-18 NOTE — Telephone Encounter (Signed)
Pt informed me that he called the [place] and was told that Dr. Madilyn Fireman would need to fax [phone] to obtain his Med records. Pt told he would need to sign a release so that we can obtain his records.Audelia Hives [place]

## 2013-12-21 ENCOUNTER — Encounter: Payer: Self-pay | Admitting: Critical Care Medicine

## 2013-12-21 ENCOUNTER — Ambulatory Visit (INDEPENDENT_AMBULATORY_CARE_PROVIDER_SITE_OTHER): Payer: Medicare Other | Admitting: Critical Care Medicine

## 2013-12-21 VITALS — BP 136/74 | HR 58 | Temp 97.7°F | Ht 71.0 in | Wt 256.0 lb

## 2013-12-21 DIAGNOSIS — J449 Chronic obstructive pulmonary disease, unspecified: Secondary | ICD-10-CM

## 2013-12-21 DIAGNOSIS — Z23 Encounter for immunization: Secondary | ICD-10-CM

## 2013-12-21 NOTE — Patient Instructions (Signed)
Prevnar 13 was given Stay on symbicort two puff twice daily Return 1 year or sooner as needed

## 2013-12-21 NOTE — Progress Notes (Signed)
Subjective:    Patient ID: Bryan Hatfield, male    DOB: March 16, 1927, 80 y.o.   MRN: [id number]  HPI 12/21/2013 Chief Complaint  Patient presents with  . Yearly Follow up    appt requested by Dr. Madilyn Fireman.  Feels breathing is at baseline.  Has DOE and a nonprod cough.  No wheezing, or chest tightness/pain.   Recent tracheobronchtiis and rx with pred nad is better Using symbicort now.  Now real wheeze.  There are no new issues. Pt denies any significant sore throat, nasal congestion or excess secretions, fever, chills, sweats, unintended weight loss, pleurtic or exertional chest pain, orthopnea PND, or leg swelling Pt denies any increase in rescue therapy over baseline, denies waking up needing it or having any early am or nocturnal exacerbations of coughing/wheezing/or dyspnea. Pt also denies any obvious fluctuation in symptoms with  weather or environmental change or other alleviating or aggravating factors     Review of Systems  Constitutional: Negative for chills, diaphoresis, activity change, appetite change, fatigue and unexpected weight change.  HENT: Negative for congestion, dental problem, ear discharge, facial swelling, hearing loss, mouth sores, nosebleeds, postnasal drip, sinus pressure, sneezing, tinnitus, trouble swallowing and voice change.   Eyes: Negative for photophobia, discharge, itching and visual disturbance.  Respiratory: Negative for apnea, cough, choking, chest tightness and stridor.   Cardiovascular: Negative for palpitations.  Gastrointestinal: Negative for nausea, constipation, blood in stool and abdominal distention.  Genitourinary: Negative for dysuria, urgency, frequency, hematuria, flank pain, decreased urine volume and difficulty urinating.  Musculoskeletal: Positive for back pain, gait problem and neck stiffness. Negative for arthralgias, joint swelling and myalgias.  Skin: Negative for color change and pallor.  Neurological: Negative for dizziness, tremors,  seizures, syncope, speech difficulty, weakness, light-headedness and numbness.  Hematological: Negative for adenopathy. Does not bruise/bleed easily.  Psychiatric/Behavioral: Positive for sleep disturbance. Negative for confusion and agitation. The patient is nervous/anxious.        Objective:   Physical Exam  Filed Vitals:   12/21/13 0915  BP: 136/74  Pulse: 58  Temp: 97.7 F (36.5 C)  TempSrc: Oral  Height: 5\' 11"  (1.803 m)  Weight: 256 lb (116.121 kg)  SpO2: 97%    Gen: Pleasant, obese, in no distress,  normal affect  ENT: No lesions,  mouth clear,  oropharynx clear, no postnasal drip  Neck: No JVD, no TMG, no carotid bruits  Lungs: No use of accessory muscles, no dullness to percussion, clear breath sounds without wheezes  Cardiovascular: RRR, heart sounds normal, no murmur or gallops, no peripheral edema  Abdomen: soft and NT, no HSM,  BS normal  Musculoskeletal: No deformities, no cyanosis or clubbing  Neuro: alert, non focal  Skin: Warm, no lesions or rashes       Assessment & Plan:   COPD golds A   with recurrent tracheobronchitis and asthmatic bronchitic component Gold Stage A Copd with asthmatic bronchitic component  Stable at present Needs to use symbicort more often Plan Prevnar 13 was given Stay on symbicort two puff twice daily Return 1 year or sooner as needed     Updated Medication List Outpatient Encounter Prescriptions as of 12/21/2013  Medication Sig  . Ascorbic Acid (VITAMIN C PO) Take 1 tablet by mouth daily.  Marland Kitchen aspirin EC 81 MG tablet Take 81 mg by mouth daily.    . budesonide-formoterol (SYMBICORT) 160-4.5 MCG/ACT inhaler Inhale 2 puffs into the lungs 2 (two) times daily.  Marland Kitchen Dextromethorphan-Guaifenesin (MUCINEX DM PO) Take 1 tablet  by mouth daily.  . furosemide (LASIX) 40 MG tablet Take 1 tablet (40 mg total) by mouth every other day. Usually takes on MWF  . losartan (COZAAR) 50 MG tablet Take 1 tablet (50 mg total) by mouth  daily.  . Multiple Vitamins-Minerals (ICAPS MV PO) Take 1 capsule by mouth daily.  . nitroGLYCERIN (NITROSTAT) 0.4 MG SL tablet Place 1 tablet (0.4 mg total) under the tongue every 5 (five) minutes as needed for chest pain.  Marland Kitchen sertraline (ZOLOFT) 100 MG tablet Take 1 tablet (100 mg total) by mouth daily.  . simvastatin (ZOCOR) 80 MG tablet Take one tablet every evening  . terazosin (HYTRIN) 2 MG capsule Take 1 capsule (2 mg total) by mouth at bedtime.  . traZODone (DESYREL) 50 MG tablet Take 1 tablet (50 mg total) by mouth at bedtime.  . triamcinolone cream (KENALOG) 0.1 % Apply to affected area(s)   twice a day for up to 2  weeks, avoid face

## 2013-12-21 NOTE — Assessment & Plan Note (Signed)
Gold Stage A Copd with asthmatic bronchitic component  Stable at present Needs to use symbicort more often Plan Prevnar 13 was given Stay on symbicort two puff twice daily Return 1 year or sooner as needed

## 2014-04-05 ENCOUNTER — Other Ambulatory Visit: Payer: Self-pay | Admitting: Family Medicine

## 2014-04-05 ENCOUNTER — Encounter: Payer: Self-pay | Admitting: Family Medicine

## 2014-04-05 ENCOUNTER — Ambulatory Visit (INDEPENDENT_AMBULATORY_CARE_PROVIDER_SITE_OTHER): Payer: Medicare Other | Admitting: Family Medicine

## 2014-04-05 VITALS — BP 109/58 | HR 71 | Ht 71.0 in | Wt 252.0 lb

## 2014-04-05 DIAGNOSIS — J449 Chronic obstructive pulmonary disease, unspecified: Secondary | ICD-10-CM

## 2014-04-05 DIAGNOSIS — L603 Nail dystrophy: Secondary | ICD-10-CM

## 2014-04-05 DIAGNOSIS — R7301 Impaired fasting glucose: Secondary | ICD-10-CM

## 2014-04-05 DIAGNOSIS — I10 Essential (primary) hypertension: Secondary | ICD-10-CM

## 2014-04-05 DIAGNOSIS — R21 Rash and other nonspecific skin eruption: Secondary | ICD-10-CM

## 2014-04-05 LAB — POCT GLYCOSYLATED HEMOGLOBIN (HGB A1C): Hemoglobin A1C: 5.9

## 2014-04-05 MED ORDER — LOSARTAN POTASSIUM 50 MG PO TABS: 25.0000 mg | ORAL_TABLET | Freq: Every day | ORAL | Status: DC

## 2014-04-05 MED ORDER — BUDESONIDE-FORMOTEROL FUMARATE 160-4.5 MCG/ACT IN AERO: 2.0000 | INHALATION_SPRAY | Freq: Two times a day (BID) | RESPIRATORY_TRACT | Status: DC

## 2014-04-05 NOTE — Addendum Note (Signed)
Addended by: Teddy Spike on: 04/05/2014 11:53 AM   Modules accepted: Orders

## 2014-04-05 NOTE — Patient Instructions (Signed)
Cut the losartan inb half. Take half a tab once a day.   Come back in one month to recheck blood pressure.

## 2014-04-05 NOTE — Addendum Note (Signed)
Addended by: Teddy Spike on: 04/05/2014 10:57 AM   Modules accepted: Orders

## 2014-04-05 NOTE — Progress Notes (Signed)
   Subjective:    Patient ID: Bryan Hatfield, male    DOB: 06/10/1926, 80 y.o.   MRN: [id number]  HPI iFG - doing well with his diet.  He goes to exercise 3-4 times per week and eating healthy.    Hypertension- Pt denies chest pain, SOB, dizziness, or heart palpitations.  Taking meds as directed w/o problems.  Denies medication side effects.  His weight is down 4 lbs.    COPD - He did get into see Dr. Joya Gaskins and has been doing well. He's been using the sample that he was given consistently but just ran out. He does report that the medication is quite pricey. But he has felt better since using it consistently.    He would also like her nails today which are and flaky on the edges. He wants to know if it's nail fungus.Does get some tenderness along the borders of the nail at times.  Review of Systems     Objective:   Physical Exam  Constitutional: He is oriented to person, place, and time. He appears well-developed and well-nourished.  HENT:  Head: Normocephalic and atraumatic.  Cardiovascular: Normal rate, regular rhythm and normal heart sounds.   Pulmonary/Chest: Effort normal and breath sounds normal.  Neurological: He is alert and oriented to person, place, and time.  Skin: Skin is warm and dry.  Psychiatric: He has a normal mood and affect. His behavior is normal.          Assessment & Plan:    Fasting glucose -A1c is 5.9 today which is stable from 6 months ago. Fantastic job on weight loss and exercise. Continue work on this and follow-up in 6 months.  COPD-given additional samples of Symbicort 160 today. Encouraged him to bring in his formulary from home. He reports he does have a book with a list of medications. It's very possible that one of the other similar medications might be a little less expensive and I'll be happy to review this for him and make a recommendation.  Hypertension-blood pressures in fact a little bit low today. I will have him cut his losartan and half  and take half a tab daily. He just filled a 90 day prescriptions of this should last him in 6 months. At that point in time we can switch him to a 25 mg tab if the blood pressure still looks great. I'll have him follow up in 1 month for just a blood pressure check.  Onychomycosis versus dystrophic nail-nail clipped and will send for fungal culture.

## 2014-04-06 LAB — KOH PREP: RESULT - KOH: NONE SEEN

## 2014-04-10 ENCOUNTER — Other Ambulatory Visit: Payer: Self-pay | Admitting: Family Medicine

## 2014-04-10 MED ORDER — ERYTHROMYCIN BASE 250 MG PO TBEC: 250.0000 mg | DELAYED_RELEASE_TABLET | Freq: Four times a day (QID) | ORAL | Status: DC

## 2014-05-03 ENCOUNTER — Ambulatory Visit (INDEPENDENT_AMBULATORY_CARE_PROVIDER_SITE_OTHER): Payer: Medicare Other | Admitting: Family Medicine

## 2014-05-03 VITALS — BP 120/66 | HR 60

## 2014-05-03 DIAGNOSIS — I10 Essential (primary) hypertension: Secondary | ICD-10-CM

## 2014-05-03 DIAGNOSIS — L081 Erythrasma: Secondary | ICD-10-CM

## 2014-05-03 LAB — CULTURE, FUNGUS WITHOUT SMEAR

## 2014-05-03 MED ORDER — KETOCONAZOLE 2 % EX CREA: 1.0000 "application " | TOPICAL_CREAM | Freq: Every day | CUTANEOUS | Status: DC

## 2014-05-03 NOTE — Progress Notes (Signed)
   Subjective:    Patient ID: Bryan Hatfield, male    DOB: July 22, 1926, 80 y.o.   MRN: [id number]  HPI  Bryan Hatfield reports today for BP recheck. Denies CP, SOB or headaches. Bryan Hatfield states that he is still having itching and has finished the medication and topical cream does not seem to be helping. Margette Fast, CMA    Review of Systems     Objective:   Physical Exam        Assessment & Plan:  HTN - much improved on lower dose of medication. Keep regular f/u.    Rash on side and groin - will call in new topical cream to try and let me know if helping or note.    Beatrice Lecher, MD

## 2014-05-03 NOTE — Progress Notes (Signed)
Wife notified. Margette Fast, CMA

## 2014-05-07 ENCOUNTER — Other Ambulatory Visit: Payer: Self-pay | Admitting: Family Medicine

## 2014-05-14 ENCOUNTER — Telehealth: Payer: Self-pay | Admitting: Family Medicine

## 2014-05-14 MED ORDER — CICLOPIROX 8 % EX SOLN: Freq: Every day | CUTANEOUS | Status: DC

## 2014-05-14 NOTE — Telephone Encounter (Signed)
Called and lvm,informing pt of results and recommendations.Bryan Hatfield, [place] Crocker

## 2014-05-14 NOTE — Telephone Encounter (Signed)
Call patient: The nail culture did show yeast on the [place] but on the culture that they have grown over the last 4 weeks it was actually negative for any type of fungus. We will try a topical treatment called Penlac. You pain it on once a day. At the end of the week use a cotton ball and I'll call to remove it. Then start over. You'll have to do this for several months. I would rather do a topical instead of an oral that can be hard on the liver.

## 2014-06-15 DIAGNOSIS — R05 Cough: Secondary | ICD-10-CM | POA: Diagnosis not present

## 2014-06-15 DIAGNOSIS — J449 Chronic obstructive pulmonary disease, unspecified: Secondary | ICD-10-CM | POA: Diagnosis not present

## 2014-06-15 DIAGNOSIS — I517 Cardiomegaly: Secondary | ICD-10-CM | POA: Diagnosis not present

## 2014-06-15 DIAGNOSIS — J40 Bronchitis, not specified as acute or chronic: Secondary | ICD-10-CM | POA: Diagnosis not present

## 2014-06-15 DIAGNOSIS — R0602 Shortness of breath: Secondary | ICD-10-CM | POA: Diagnosis not present

## 2014-07-01 ENCOUNTER — Other Ambulatory Visit: Payer: Self-pay | Admitting: Family Medicine

## 2014-07-02 NOTE — Telephone Encounter (Signed)
Six month f/u will be due  May 2016

## 2014-07-26 DIAGNOSIS — R1030 Lower abdominal pain, unspecified: Secondary | ICD-10-CM | POA: Diagnosis not present

## 2014-08-15 ENCOUNTER — Ambulatory Visit (INDEPENDENT_AMBULATORY_CARE_PROVIDER_SITE_OTHER): Payer: Medicare Other | Admitting: Interventional Cardiology

## 2014-08-15 ENCOUNTER — Encounter: Payer: Self-pay | Admitting: Interventional Cardiology

## 2014-08-15 VITALS — BP 114/52 | HR 65 | Ht 71.0 in | Wt 249.0 lb

## 2014-08-15 DIAGNOSIS — I25118 Atherosclerotic heart disease of native coronary artery with other forms of angina pectoris: Secondary | ICD-10-CM

## 2014-08-15 DIAGNOSIS — G4730 Sleep apnea, unspecified: Secondary | ICD-10-CM

## 2014-08-15 DIAGNOSIS — E785 Hyperlipidemia, unspecified: Secondary | ICD-10-CM

## 2014-08-15 DIAGNOSIS — I10 Essential (primary) hypertension: Secondary | ICD-10-CM | POA: Diagnosis not present

## 2014-08-15 NOTE — Patient Instructions (Signed)
Your physician recommends that you continue on your current medications as directed. Please refer to the Current Medication list given to you today.   Your physician recommends that you schedule a follow-up appointment as needed  

## 2014-08-15 NOTE — Progress Notes (Signed)
Cardiology Office Note   Date:  08/15/2014   ID:  AFTON MIKELSON, DOB [birth year], MRN [id number]  PCP:  Beatrice Lecher, MD  Cardiologist:   Sinclair Grooms, MD   No chief complaint on file.     History of Present Illness: Bryan Hatfield is a 82 y.o. male who presents for who has CAD and hypertension he is doing well. He denies angina. He has not had syncope any cardiac problems. He takes his medications as prescribed.    Past Medical History  Diagnosis Date  . CAD (coronary artery disease)     a. PTCA 2001. b. f/u cath 2008: mild nonobstructive CAD.  Marland Kitchen Hypertension   . Acute respiratory failure 09/25/11    hypoxic  . Peripheral vascular disease     patient denies knowledge of, but listed in his chart  . High cholesterol   . Anxiety   . Pleural effusion     Parapneumonic ?r/t PNA s/p thoracentesis 2008  . GERD (gastroesophageal reflux disease)   . Colon polyps   . COPD (chronic obstructive pulmonary disease)   . Pneumonia     hx  . Sleep apnea     cpap     Past Surgical History  Procedure Laterality Date  . Hemorrhoid surgery  1960's  . Coronary angioplasty  01/2000  . Cataract extraction w/ intraocular lens  implant, bilateral  ~ 2006  . Cholecystectomy N/A 10/11/2012    Procedure: LAPAROSCOPIC CHOLECYSTECTOMY WITH INTRAOPERATIVE CHOLANGIOGRAM;  Surgeon: Adin Hector, MD;  Location: [facility];  Service: General;  Laterality: N/A;     Current Outpatient Prescriptions  Medication Sig Dispense Refill  . Ascorbic Acid (VITAMIN C PO) Take 1 tablet by mouth daily.    Marland Kitchen aspirin EC 81 MG tablet Take 81 mg by mouth daily.      . budesonide-formoterol (SYMBICORT) 160-4.5 MCG/ACT inhaler Inhale 2 puffs into the lungs 2 (two) times daily. 1 Inhaler 0  . ciclopirox (PENLAC) 8 % solution Apply topically at bedtime. Apply over nail and surrounding skin. Apply daily over previous coat. After seven (7) days, may remove with alcohol and continue cycle. 6.6 mL 11  . furosemide  (LASIX) 40 MG tablet Take 1 tablet (40 mg total) by mouth every other day. Usually takes on MWF 45 tablet 3  . ketoconazole (NIZORAL) 2 % cream Apply 1 application topically daily. 60 g 1  . losartan (COZAAR) 50 MG tablet Take 1 tablet by mouth  daily 90 tablet 0  . Multiple Vitamins-Minerals (ICAPS MV PO) Take 1 capsule by mouth daily.    Marland Kitchen NITROSTAT 0.4 MG SL tablet Dissolve 1 tablet under the tongue every 5 minutes as  needed chest pain 25 tablet 1  . sertraline (ZOLOFT) 100 MG tablet Take 1 tablet by mouth  daily 90 tablet 1  . simvastatin (ZOCOR) 80 MG tablet Take 1 tablet by mouth  every evening 90 tablet 0  . terazosin (HYTRIN) 2 MG capsule Take 1 capsule (2 mg total) by mouth at bedtime. 90 capsule 3  . traZODone (DESYREL) 50 MG tablet Take 1 tablet by mouth at  bedtime 90 tablet 0  . triamcinolone cream (KENALOG) 0.1 % Apply to affected area(s)  twice a day for up to 2  weeks. Avoid face. 80 g 5   No current facility-administered medications for this visit.    Allergies:   Review of patient's allergies indicates no known allergies.    Social History:  The patient  reports that he quit smoking about 32 years ago. His smoking use included Cigarettes. He has a 10 pack-year smoking history. He has never used smokeless tobacco. He reports that he does not drink alcohol or use illicit drugs.   Family History:  The patient's family history includes Arthritis in his mother and sister; Asthma in his mother; Emphysema in his mother; Stroke in his father, mother, and sister.    ROS:  Please see the history of present illness.   Otherwise, review of systems are positive for insomnia, easy bruising, and knee discomfort. .   All other systems are reviewed and negative.    PHYSICAL EXAM: VS:  BP 114/52 mmHg  Pulse 65  Ht 5\' 11"  (1.803 m)  Wt 249 lb (112.946 kg)  BMI 34.74 kg/m2 , BMI Body mass index is 34.74 kg/(m^2). GEN: Well nourished, well developed, in no acute distress HEENT:  normal Neck: no JVD, carotid bruits, or masses Cardiac: RRR; no murmurs, rubs, or gallops,no edema  Respiratory:  clear to auscultation bilaterally, normal work of breathing GI: soft, nontender, nondistended, + BS MS: no deformity or atrophy Skin: warm and dry, no rash Neuro:  Strength and sensation are intact Psych: euthymic mood, full affect   EKG:  EKG is ordered today. The ekg ordered today demonsnormal sinus rhythm with left axis deviation and no evidence of prior infarction or change from prior tracing. Recent Labs: 09/23/2013: ALT 22; BUN 19; Creatinine 0.82; Hemoglobin 12.7*; Platelets 140*; Potassium 4.3; Sodium 139    Lipid Panel    Component Value Date/Time   CHOL 155 06/07/2013 1410   TRIG 138 06/07/2013 1410   HDL 70 06/07/2013 1410   CHOLHDL 2.2 06/07/2013 1410   VLDL 28 06/07/2013 1410   LDLCALC 57 06/07/2013 1410      Wt Readings from Last 3 Encounters:  08/15/14 249 lb (112.946 kg)  04/05/14 252 lb (114.306 kg)  12/21/13 256 lb (116.121 kg)      Other studies Reviewed: Additional studies/ records that were reviewed today includnone    ASSESSMENT AND PLAN:  Coronary artery disease with other forms of angina pectoris: Doing well  Essential hypertension, benign: Excellent control  Hyperlipidemia: Followed by primary care   Sleep apnea    Current medicines are reviewed at length with the patient today.  The patient has concerns regarding medicines.  The following changes have been made:  Wanted to know why he needed to continue to take statin therapy. I encouraged no change in the current medical regimen.  Labs/ tests ordered today include:  No orders of the defined types were placed in this encounter.     Disposition:   FU wiH. [name] in return as needed     Signed, Sinclair Grooms, MD  08/15/2014 3:19 PM    [address], [place], [place]  [address] Phone: [phone]; Fax: [phone]

## 2014-08-27 ENCOUNTER — Other Ambulatory Visit: Payer: Self-pay | Admitting: Family Medicine

## 2014-08-30 ENCOUNTER — Ambulatory Visit (INDEPENDENT_AMBULATORY_CARE_PROVIDER_SITE_OTHER): Payer: Medicare Other | Admitting: Sports Medicine

## 2014-08-30 ENCOUNTER — Encounter: Payer: Self-pay | Admitting: Sports Medicine

## 2014-08-30 VITALS — BP 115/60 | HR 64 | Ht 71.0 in | Wt 248.0 lb

## 2014-08-30 DIAGNOSIS — R531 Weakness: Secondary | ICD-10-CM

## 2014-08-30 DIAGNOSIS — R42 Dizziness and giddiness: Secondary | ICD-10-CM

## 2014-08-30 DIAGNOSIS — Z79899 Other long term (current) drug therapy: Secondary | ICD-10-CM | POA: Diagnosis not present

## 2014-08-30 LAB — POCT URINALYSIS DIPSTICK
Bilirubin, UA: NEGATIVE
Blood, UA: NEGATIVE
Glucose, UA: NEGATIVE
Ketones, UA: NEGATIVE
Leukocytes, UA: NEGATIVE
Nitrite, UA: NEGATIVE
Protein, UA: NEGATIVE
Spec Grav, UA: 1.02
Urobilinogen, UA: 0.2
pH, UA: 7

## 2014-08-30 NOTE — Progress Notes (Signed)
  Subjective:    CC: Weakness and shakiness  HPI: This is a pleasant 81 year old male, he has a history of coronary artery disease, he is post-stenting, and more recently 2 years ago a cardiac catheterization without any evidence of restenosis.  He typically exercises vigorously, 30 minutes to 1 hour on a stair stepper machine. Most recently after a visit with his cardiologist where a 12-lead EKG was normal, he had been exercising vigorously for 30 minutes without any chest pain, shortness of breath, palpitations, diaphoresis or nausea. 1-2 minutes after he stopped without a cool down, he developed shaking, weakness, and sweatiness without any palpitations, chest pain, or shortness of breath. After 30 minutes or so all symptoms resolved, he continued to be free of chest pain. He is here to see if anything else needs to be done. He has been asymptomatic since the episode.  Past medical history, Surgical history, Family history not pertinant except as noted below, Social history, Allergies, and medications have been entered into the medical record, reviewed, and no changes needed.   Review of Systems: No fevers, chills, night sweats, weight loss, chest pain, or shortness of breath.   Objective:    General: Well Developed, well nourished, and in no acute distress.  Neuro: Alert and oriented x3, extra-ocular muscles intact, sensation grossly intact.  HEENT: Normocephalic, atraumatic, pupils equal round reactive to light, neck supple, no masses, no lymphadenopathy, thyroid nonpalpable.  Skin: Warm and dry, no rashes. Cardiac: Regular rate and rhythm, no murmurs rubs or gallops, no lower extremity edema.  Respiratory: Clear to auscultation bilaterally. Not using accessory muscles, speaking in full sentences.  Urinalysis is negative.  12-lead EKG reviewed and shows left axis deviation but with a normal rate, normal rhythm, no ST changes, and completely unchanged from prior EKG done with the  cardiologist.  Impression and Recommendations:

## 2014-08-30 NOTE — Assessment & Plan Note (Signed)
Episode of dizziness likely represents venous pooling after physical exercise. We did discuss the importance of inappropriate cooldown period after aerobic exercise will continued use of the legs as a second heart, and walking briskly after vigorous exercise. I do not think that this represents an acute coronary syndrome considering no chest pain, nausea, diaphoresis, palpitations during intense exercise/30 minutes on the stepping machine, we are going to do some blood work, and I'd like to see him back on an as-needed basis. EKG is unchanged. Urinalysis is negative.

## 2014-08-31 LAB — COMPREHENSIVE METABOLIC PANEL WITH GFR
ALT: 17 U/L (ref 0–53)
Alkaline Phosphatase: 78 U/L (ref 39–117)
BUN: 19 mg/dL (ref 6–23)
Creat: 0.92 mg/dL (ref 0.50–1.35)
Total Bilirubin: 0.3 mg/dL (ref 0.2–1.2)

## 2014-08-31 LAB — TSH: TSH: 5.247 u[IU]/mL — ABNORMAL HIGH (ref 0.350–4.500)

## 2014-08-31 LAB — TROPONIN I: Troponin I: 0.01 ng/mL (ref <0.06)

## 2014-08-31 LAB — COMPREHENSIVE METABOLIC PANEL
AST: 22 U/L (ref 0–37)
Albumin: 4 g/dL (ref 3.5–5.2)
CO2: 30 mEq/L (ref 19–32)
Calcium: 8.3 mg/dL — ABNORMAL LOW (ref 8.4–10.5)
Chloride: 105 mEq/L (ref 96–112)
Glucose, Bld: 78 mg/dL (ref 70–99)
Potassium: 4.3 mEq/L (ref 3.5–5.3)
Sodium: 142 mEq/L (ref 135–145)
Total Protein: 6.5 g/dL (ref 6.0–8.3)

## 2014-08-31 LAB — CBC
HCT: 36.7 % — ABNORMAL LOW (ref 39.0–52.0)
Hemoglobin: 12 g/dL — ABNORMAL LOW (ref 13.0–17.0)
MCH: 30.7 pg (ref 26.0–34.0)
MCHC: 32.7 g/dL (ref 30.0–36.0)
MCV: 93.9 fL (ref 78.0–100.0)
MPV: 9.8 fL (ref 8.6–12.4)
Platelets: 152 10*3/uL (ref 150–400)
RBC: 3.91 MIL/uL — ABNORMAL LOW (ref 4.22–5.81)
RDW: 13.9 % (ref 11.5–15.5)
WBC: 5 10*3/uL (ref 4.0–10.5)

## 2014-08-31 LAB — CK TOTAL AND CKMB (NOT AT ARMC)
CK, MB: 1.5 ng/mL (ref 0.0–5.0)
Total CK: 86 U/L (ref 7–232)

## 2014-09-04 NOTE — Addendum Note (Signed)
Addended by: Narda Rutherford on: 09/04/2014 04:03 PM   Modules accepted: Orders

## 2014-09-05 ENCOUNTER — Encounter: Payer: Self-pay | Admitting: Family Medicine

## 2014-09-05 ENCOUNTER — Ambulatory Visit (INDEPENDENT_AMBULATORY_CARE_PROVIDER_SITE_OTHER): Payer: Medicare Other | Admitting: Family Medicine

## 2014-09-05 DIAGNOSIS — R7309 Other abnormal glucose: Secondary | ICD-10-CM

## 2014-09-05 DIAGNOSIS — Z13220 Encounter for screening for lipoid disorders: Secondary | ICD-10-CM | POA: Diagnosis not present

## 2014-09-05 DIAGNOSIS — R7989 Other specified abnormal findings of blood chemistry: Secondary | ICD-10-CM

## 2014-09-05 DIAGNOSIS — D649 Anemia, unspecified: Secondary | ICD-10-CM | POA: Diagnosis not present

## 2014-09-05 DIAGNOSIS — R946 Abnormal results of thyroid function studies: Secondary | ICD-10-CM | POA: Diagnosis not present

## 2014-09-05 DIAGNOSIS — Z79899 Other long term (current) drug therapy: Secondary | ICD-10-CM | POA: Diagnosis not present

## 2014-09-05 MED ORDER — CLINDAMYCIN PHOS-BENZOYL PEROX 1-5 % EX GEL: Freq: Two times a day (BID) | CUTANEOUS | Status: DC

## 2014-09-05 MED ORDER — FLUCONAZOLE 150 MG PO TABS: 150.0000 mg | ORAL_TABLET | ORAL | Status: DC

## 2014-09-05 NOTE — Progress Notes (Signed)
   Subjective:    Patient ID: Bryan Hatfield, male    DOB: 1926/10/31, 81 y.o.   MRN: [id number]  HPI  follow-up for episode of dizziness.  He was at the gym and started to feel sweaty and shaky while he was on the bicycle exercising. He got off of the bike and sent down on a bench in the hall. He got very dizzy with the abrupt cessation of exercise. He came in to be evaluated. They did some blood work and EKG. EKG was normal but he had some abnormalities on the blood work. He is here to review those today. He has been exercising since he was seen and took the advice to slowly reduce his exercise as he is stopping to avoid drop in blood pressure. He is due to take his Lasix today. He did take his other medications today. Next  Erythrasma-he still has intense itching and has been using the topical antifungal cream without any significant relief for success.   Review of Systems     Objective:   Physical Exam        Assessment & Plan:  Dizziness -  Seems to have resolved. Changing his routine post exercise has been very helpful. His blood pressures actually little bit low today and I wonder if that may have contributed to his episode that he experienced.  I also encouraged him to make sure he is eating breakfast regularly. He says most days he doesn't eat breakfast before going to the gym.  Hypertension- today his blood pressure is low today and has been on a couple of occasions on going to go ahead and decrease his losartan to 25 mg. He just filled a 90 day supply so he will split these in half.  Hypocalcemia -   Check calcium and parathyroid level. Next  Abnormal thyroid level-recheck TSH and will add T4 and T3 for confirmation as well.  Abnormal glucose -we'll check hemoglobin A1c level.  Anemia, unspecified.  Will check for iron deficiency, B12 deficiecy and folate deficiency. Hemoglobin was around 12 with no significant change in MCV. He denies any blood in the stool etc.

## 2014-09-05 NOTE — Patient Instructions (Addendum)
Cut the losartan in half.  Take half a tab daily.  For the rash in her groin area take the oral tablet call Diflucan every 3 days until you take all 4 tabs. Also apply the new cream call clindamycin. If the rash is not significantly improved over the next 2-3 weeks then please come back and let us know.  please go to the lab when you are fasting so that we can get up-to-date blood work. We will call you with those results once available.

## 2014-09-06 ENCOUNTER — Telehealth: Payer: Self-pay | Admitting: Family Medicine

## 2014-09-06 LAB — VITAMIN B12: Vitamin B-12: 506 pg/mL (ref 211–911)

## 2014-09-06 LAB — HEMOGLOBIN A1C
HEMOGLOBIN A1C: 6 % — AB (ref <5.7)
Mean Plasma Glucose: 126 mg/dL — ABNORMAL HIGH (ref <117)

## 2014-09-06 LAB — TSH: TSH: 6.292 u[IU]/mL — ABNORMAL HIGH (ref 0.350–4.500)

## 2014-09-06 LAB — LIPID PANEL
CHOL/HDL RATIO: 2.5 ratio
Cholesterol: 145 mg/dL (ref 0–200)
HDL: 59 mg/dL (ref >=40)
LDL CALC: 69 mg/dL (ref 0–99)
TRIGLYCERIDES: 84 mg/dL (ref <150)
VLDL: 17 mg/dL (ref 0–40)

## 2014-09-06 LAB — FERRITIN: Ferritin: 153 ng/mL (ref 22–322)

## 2014-09-06 LAB — T3, FREE: T3 FREE: 3 pg/mL (ref 2.3–4.2)

## 2014-09-06 LAB — T4, FREE: Free T4: 0.81 ng/dL (ref 0.80–1.80)

## 2014-09-06 LAB — FOLATE: Folate: 19.7 ng/mL

## 2014-09-06 NOTE — Telephone Encounter (Signed)
Received fax for pa on Clindamycin/ benz sent through cover my meds waiting on auth. -CF

## 2014-09-06 NOTE — Telephone Encounter (Signed)
Received fax from [facility] and medication is on plans list of covered meds I informed the pharmacy and let them know if they had billing questions they could contact [facility] help desk at [phone]. - CF

## 2014-09-07 ENCOUNTER — Encounter: Payer: Self-pay | Admitting: Family Medicine

## 2014-09-07 DIAGNOSIS — E039 Hypothyroidism, unspecified: Secondary | ICD-10-CM | POA: Insufficient documentation

## 2014-09-07 LAB — PTH, INTACT AND CALCIUM
Calcium: 8.6 mg/dL (ref 8.4–10.5)
PTH: 32 pg/mL (ref 14–64)

## 2014-09-11 ENCOUNTER — Telehealth: Payer: Self-pay | Admitting: *Deleted

## 2014-09-11 NOTE — Telephone Encounter (Signed)
Pt's wife called and lvm stating that her husband has not received his results .Bryan Hatfield [place]

## 2014-09-13 ENCOUNTER — Telehealth: Payer: Self-pay | Admitting: *Deleted

## 2014-09-13 ENCOUNTER — Encounter: Payer: Self-pay | Admitting: Emergency Medicine

## 2014-09-13 ENCOUNTER — Other Ambulatory Visit: Payer: Self-pay | Admitting: Family Medicine

## 2014-09-13 ENCOUNTER — Emergency Department (INDEPENDENT_AMBULATORY_CARE_PROVIDER_SITE_OTHER)
Admission: EM | Admit: 2014-09-13 | Discharge: 2014-09-13 | Disposition: A | Discharge status: Home / Self Care | Payer: Medicare Other | Source: Home / Self Care | Attending: Emergency Medicine | Admitting: Emergency Medicine

## 2014-09-13 DIAGNOSIS — R531 Weakness: Secondary | ICD-10-CM | POA: Diagnosis not present

## 2014-09-13 DIAGNOSIS — I959 Hypotension, unspecified: Secondary | ICD-10-CM | POA: Diagnosis not present

## 2014-09-13 NOTE — Telephone Encounter (Signed)
Unsure if Pt is still taking the Rx requested, attempted to contact with no answer. Please advise if refill is appropriate.

## 2014-09-13 NOTE — ED Provider Notes (Signed)
CSN: [id number]     Arrival date & time 09/13/14  1008 History   First MD Initiated Contact with Patient 09/13/14 1042     Chief Complaint  Patient presents with  . Weakness   (Consider location/radiation/quality/duration/timing/severity/associated sxs/prior Treatment) Patient is a 81 y.o. male presenting with weakness. The history is provided by the patient. No language interpreter was used.  Weakness This is a new problem. Episode onset: 2 weeks. The problem occurs constantly. The problem has been gradually worsening. Associated symptoms include shortness of breath. Pertinent negatives include no chest pain and no headaches. Nothing aggravates the symptoms. Nothing relieves the symptoms. He has tried nothing for the symptoms.  Pt complains of blood pressure being low.  Pt reports his losaten was decreased by half last week. 4/7.  Pt reports his blood pressure was still low today.  Pt also reports he was told that he was anemic and is concerned that this is why he is feeling bad.  Pt was recently seen by cardiology and had a good check up.  Pt reports he has been exercising 3 times a week for 45 minutes but has not been able to for 3 weeks due to weakness.   Past Medical History  Diagnosis Date  . CAD (coronary artery disease)     a. PTCA 2001. b. f/u cath 2008: mild nonobstructive CAD.  Marland Kitchen Hypertension   . Acute respiratory failure 09/25/11    hypoxic  . Peripheral vascular disease     patient denies knowledge of, but listed in his chart  . High cholesterol   . Anxiety   . Pleural effusion     Parapneumonic ?r/t PNA s/p thoracentesis 2008  . GERD (gastroesophageal reflux disease)   . Colon polyps   . COPD (chronic obstructive pulmonary disease)   . Pneumonia     hx  . Sleep apnea     cpap    Past Surgical History  Procedure Laterality Date  . Hemorrhoid surgery  1960's  . Coronary angioplasty  01/2000  . Cataract extraction w/ intraocular lens  implant, bilateral  ~ 2006  .  Cholecystectomy N/A 10/11/2012    Procedure: LAPAROSCOPIC CHOLECYSTECTOMY WITH INTRAOPERATIVE CHOLANGIOGRAM;  Surgeon: Adin Hector, MD;  Location: [facility];  Service: General;  Laterality: N/A;   Family History  Problem Relation Age of Onset  . Stroke Mother   . Arthritis Mother   . Asthma Mother   . Emphysema Mother   . Arthritis Sister   . Stroke Sister   . Stroke Father    History  Substance Use Topics  . Smoking status: Former Smoker -- 1.00 packs/day for 10 years    Types: Cigarettes    Quit date: 06/01/1982  . Smokeless tobacco: Never Used     Comment: "quit smoking ~ 25 years ago; smoked ~ 1 pk/wk"  . Alcohol Use: No    Review of Systems  Respiratory: Positive for shortness of breath.   Cardiovascular: Negative for chest pain.  Neurological: Positive for weakness. Negative for headaches.  All other systems reviewed and are negative.   Allergies  Review of patient's allergies indicates no known allergies.  Home Medications   Prior to Admission medications   Medication Sig Start Date End Date Taking? Authorizing Provider  Ascorbic Acid (VITAMIN C PO) Take 1 tablet by mouth daily.    Historical Provider, MD  aspirin EC 81 MG tablet Take 81 mg by mouth daily.      Historical Provider, MD  budesonide-formoterol (SYMBICORT) 160-4.5 MCG/ACT inhaler Inhale 2 puffs into the lungs 2 (two) times daily. 04/05/14   [name], MD  ciclopirox (PENLAC) 8 % solution Apply topically at bedtime. Apply over nail and surrounding skin. Apply daily over previous coat. After seven (7) days, may remove with alcohol and continue cycle. 05/14/14   [name], MD  clindamycin-benzoyl peroxide [facility]) gel Apply topically 2 (two) times daily. 09/05/14   [name], MD  fluconazole (DIFLUCAN) 150 MG tablet Take 1 tablet (150 mg total) by mouth every 3 (three) days. 09/05/14   [name], MD  furosemide (LASIX) 40 MG tablet Take 1 tablet (40 mg total) by  mouth every other day. Usually takes on MWF 06/02/13   Sean Hommel, DO  ketoconazole (NIZORAL) 2 % cream APPLY TOPICALLY ONCE DAILY 08/29/14   [name], MD  losartan (COZAAR) 50 MG tablet Take 1 tablet by mouth  daily Patient taking differently: Take 1/2  tablet by mouth  daily 07/02/14   [name], MD  Multiple Vitamins-Minerals (ICAPS MV PO) Take 1 capsule by mouth daily.    Historical Provider, MD  NITROSTAT 0.4 MG SL tablet Dissolve 1 tablet under the tongue every 5 minutes as  needed chest pain 05/07/14   [name], MD  sertraline (ZOLOFT) 100 MG tablet Take 1 tablet by mouth  daily 05/07/14   [name], MD  simvastatin (ZOCOR) 80 MG tablet Take 1 tablet by mouth  every evening 05/07/14   [name], MD  terazosin (HYTRIN) 2 MG capsule Take 1 capsule (2 mg total) by mouth at bedtime. 06/02/13   Marcial Pacas, DO  traZODone (DESYREL) 50 MG tablet Take 1 tablet by mouth at  bedtime 05/07/14   [name], MD  triamcinolone cream (KENALOG) 0.1 % Apply to affected area(s)  twice a day for up to 2  weeks. Avoid face. 05/07/14   [name], MD   BP 107/62 mmHg  Temp(Src) 97.6 F (36.4 C) (Oral)  Resp 18  Ht 5\' 11"  (1.803 m)  Wt 247 lb (112.038 kg)  BMI 34.46 kg/m2  SpO2 95% Physical Exam  Constitutional: He is oriented to person, place, and time. He appears well-developed and well-nourished.  HENT:  Head: Normocephalic and atraumatic.  Right Ear: External ear normal.  Left Ear: External ear normal.  Nose: Nose normal.  Mouth/Throat: Oropharynx is clear and moist.  Eyes: Conjunctivae and EOM are normal. Pupils are equal, round, and reactive to light.  Neck: Normal range of motion.  Cardiovascular: Normal rate, regular rhythm and normal heart sounds.   Pulmonary/Chest: Effort normal and breath sounds normal.  Abdominal: Soft. He exhibits no distension.  Musculoskeletal: Normal range of motion.  Neurological: He is alert and  oriented to person, place, and time.  Skin: Skin is warm.  Psychiatric: He has a normal mood and affect.  Nursing note and vitals reviewed.   ED Course  Procedures (including critical care time) Labs Review Labs Reviewed - No data to display  Imaging Review No results found.   MDM  I reviewed labs,  Hemoglobin is 12.   I spoke to Dr. Burnett Harry for advice.   I will decrease his lasix by half.  20 mg a day.   He is to take 20mg  more if he has swelling. Pt is on hytrin for his prostate which may also lower his blood pressure. Pt has lost about 25 pds over 9 months (intentionally)  this may explain lower blood pressure however I want pt to have close follow up.  (RN scheduled pt to see Luvenia Starch on Monday.     1. Weakness   2. Hypotension, unspecified hypotension type    AVS Decrease lasix by I/@    Fransico Meadow, PA-C 09/14/14 1616

## 2014-09-13 NOTE — Discharge Instructions (Signed)
Hypotension As your heart beats, it forces blood through your arteries. This force is your blood pressure. If your blood pressure is too low for you to go about your normal activities or to support the organs of your body, you have hypotension. Hypotension is also referred to as low blood pressure. When your blood pressure becomes too low, you may not get enough blood to your brain. As a result, you may feel weak, feel lightheaded, or develop a rapid heart rate. In a more severe case, you may faint. CAUSES Various conditions can cause hypotension. These include:  Blood loss.  Dehydration.  Heart or endocrine problems.  Pregnancy.  Severe infection.  Not having a well-balanced diet filled with needed nutrients.  Severe allergic reactions (anaphylaxis). Some medicines, such as blood pressure medicine or water pills (diuretics), may lower your blood pressure below normal. Sometimes taking too much medicine or taking medicine not as directed can cause hypotension. TREATMENT  Hospitalization is sometimes required for hypotension if fluid or blood replacement is needed, if time is needed for medicines to wear off, or if further monitoring is needed. Treatment might include changing your diet, changing your medicines (including medicines aimed at raising your blood pressure), and use of support stockings. HOME CARE INSTRUCTIONS   Drink enough fluids to keep your urine clear or pale yellow.  Take your medicines as directed by your health care provider.  Get up slowly from reclining or sitting positions. This gives your blood pressure a chance to adjust.  Wear support stockings as directed by your health care provider.  Maintain a healthy diet by including nutritious food, such as fruits, vegetables, nuts, whole grains, and lean meats. SEEK MEDICAL CARE IF:  You have vomiting or diarrhea.  You have a fever for more than 2-3 days.  You feel more thirsty than usual.  You feel weak and  tired. SEEK IMMEDIATE MEDICAL CARE IF:   You have chest pain or a fast or irregular heartbeat.  You have a loss of feeling in some part of your body, or you lose movement in your arms or legs.  You have trouble speaking.  You become sweaty or feel lightheaded.  You faint. MAKE SURE YOU:   Understand these instructions.  Will watch your condition.  Will get help right away if you are not doing well or get worse. Document Released: 05/18/2005 Document Revised: 03/08/2013 Document Reviewed: 11/18/2012 [facility] Patient Information 2015 [place], [place]. This information is not intended to replace advice given to you by your health care provider. Make sure you discuss any questions you have with your health care provider.  Fatigue Fatigue is a feeling of tiredness, lack of energy, lack of motivation, or feeling tired all the time. Having enough rest, good nutrition, and reducing stress will normally reduce fatigue. Consult your caregiver if it persists. The nature of your fatigue will help your caregiver to find out its cause. The treatment is based on the cause.  CAUSES  There are many causes for fatigue. Most of the time, fatigue can be traced to one or more of your habits or routines. Most causes fit into one or more of three general areas. They are: Lifestyle problems  Sleep disturbances.  Overwork.  Physical exertion.  Unhealthy habits.  Poor eating habits or eating disorders.  Alcohol and/or drug use .  Lack of proper nutrition (malnutrition). Psychological problems  Stress and/or anxiety problems.  Depression.  Grief.  Boredom. Medical Problems or Conditions  Anemia.  Pregnancy.  Thyroid gland problems.  Recovery from major surgery.  Continuous pain.  Emphysema or asthma that is not well controlled  Allergic conditions.  Diabetes.  Infections (such as mononucleosis).  Obesity.  Sleep disorders, such as sleep apnea.  Heart failure or other  heart-related problems.  Cancer.  Kidney disease.  Liver disease.  Effects of certain medicines such as antihistamines, cough and cold remedies, prescription pain medicines, heart and blood pressure medicines, drugs used for treatment of cancer, and some antidepressants. SYMPTOMS  The symptoms of fatigue include:   Lack of energy.  Lack of drive (motivation).  Drowsiness.  Feeling of indifference to the surroundings. DIAGNOSIS  The details of how you feel help guide your caregiver in finding out what is causing the fatigue. You will be asked about your present and past health condition. It is important to review all medicines that you take, including prescription and non-prescription items. A thorough exam will be done. You will be questioned about your feelings, habits, and normal lifestyle. Your caregiver may suggest blood tests, urine tests, or other tests to look for common medical causes of fatigue.  TREATMENT  Fatigue is treated by correcting the underlying cause. For example, if you have continuous pain or depression, treating these causes will improve how you feel. Similarly, adjusting the dose of certain medicines will help in reducing fatigue.  HOME CARE INSTRUCTIONS   Try to get the required amount of good sleep every night.  Eat a healthy and nutritious diet, and drink enough water throughout the day.  Practice ways of relaxing (including yoga or meditation).  Exercise regularly.  Make plans to change situations that cause stress. Act on those plans so that stresses decrease over time. Keep your work and personal routine reasonable.  Avoid street drugs and minimize use of alcohol.  Start taking a daily multivitamin after consulting your caregiver. SEEK MEDICAL CARE IF:   You have persistent tiredness, which cannot be accounted for.  You have fever.  You have unintentional weight loss.  You have headaches.  You have disturbed sleep throughout the  night.  You are feeling sad.  You have constipation.  You have dry skin.  You have gained weight.  You are taking any new or different medicines that you suspect are causing fatigue.  You are unable to sleep at night.  You develop any unusual swelling of your legs or other parts of your body. SEEK IMMEDIATE MEDICAL CARE IF:   You are feeling confused.  Your vision is blurred.  You feel faint or pass out.  You develop severe headache.  You develop severe abdominal, pelvic, or back pain.  You develop chest pain, shortness of breath, or an irregular or fast heartbeat.  You are unable to pass a normal amount of urine.  You develop abnormal bleeding such as bleeding from the rectum or you vomit blood.  You have thoughts about harming yourself or committing suicide.  You are worried that you might harm someone else. MAKE SURE YOU:   Understand these instructions.  Will watch your condition.  Will get help right away if you are not doing well or get worse. Document Released: 03/15/2007 Document Revised: 08/10/2011 Document Reviewed: 09/19/2013 [facility] Patient Information 2015 [place], [place]. This information is not intended to replace advice given to you by your health care provider. Make sure you discuss any questions you have with your health care provider.

## 2014-09-13 NOTE — Telephone Encounter (Signed)
Pt's wife called this morning stating that Azell's bp is still running low even after reducing his losartan.  He's really tired and just feeling "bad".  I advised her to at least take him to UC so a dr could lay eyes on him since our office is booked up.  They preferred to see you but I told her that I didn't feel like she needed to wait.  She also stated that Kimm told her to call us so he's now getting concerned as well.  Just wanted you to know the conversation.

## 2014-09-13 NOTE — ED Notes (Signed)
Patient presents to [facility] with C/O generalized weakness and intermittent diaphoresis times two weeks. A&O, patient denies pain or discomfort.

## 2014-09-13 NOTE — ED Notes (Signed)
Per Karen's request, apt made with first available @ PCP office. Apt made for patient to see [place] Planas, [facility] 09/17/14 @ 1015am. Apt time given to patient while in office.

## 2014-09-14 ENCOUNTER — Other Ambulatory Visit: Payer: Self-pay | Admitting: *Deleted

## 2014-09-14 MED ORDER — LEVOTHYROXINE SODIUM 112 MCG PO CAPS: 1.0000 | ORAL_CAPSULE | Freq: Every day | ORAL | Status: DC

## 2014-09-14 MED ORDER — BENZOYL PEROXIDE-ERYTHROMYCIN 5-3 % EX GEL: Freq: Two times a day (BID) | CUTANEOUS | Status: DC

## 2014-09-14 NOTE — Telephone Encounter (Signed)
Med changed & sent to local pharm.  Pt notified.

## 2014-09-14 NOTE — Telephone Encounter (Signed)
Have him stop the losartan completely if nto already told to do so at [facility].  Will have him start thyroid medication as well. New rx sent to pharmacy.

## 2014-09-14 NOTE — Telephone Encounter (Signed)
Spoke with pt this morning and he was only advised to cut his furosemide in half by urgent care.  I told him to stop his losartan completely, at least until he sees [place] next week.  He wanted you to know that the Benzaclin you sent in for him will cost him $68 but his ins recommended erythromycin/benzoyl peroxide pre fd (?) which will cost him $45.

## 2014-09-14 NOTE — Telephone Encounter (Signed)
Okay to change products. Okay to send new prescription.

## 2014-09-17 ENCOUNTER — Ambulatory Visit (INDEPENDENT_AMBULATORY_CARE_PROVIDER_SITE_OTHER): Payer: Medicare Other | Admitting: Physician Assistant

## 2014-09-17 ENCOUNTER — Encounter: Payer: Self-pay | Admitting: Physician Assistant

## 2014-09-17 VITALS — BP 111/63 | HR 83 | Ht 71.0 in | Wt 245.0 lb

## 2014-09-17 DIAGNOSIS — L081 Erythrasma: Secondary | ICD-10-CM

## 2014-09-17 DIAGNOSIS — R531 Weakness: Secondary | ICD-10-CM

## 2014-09-17 DIAGNOSIS — E038 Other specified hypothyroidism: Secondary | ICD-10-CM

## 2014-09-17 DIAGNOSIS — I959 Hypotension, unspecified: Secondary | ICD-10-CM | POA: Diagnosis not present

## 2014-09-17 DIAGNOSIS — E039 Hypothyroidism, unspecified: Secondary | ICD-10-CM

## 2014-09-17 MED ORDER — LEVOTHYROXINE SODIUM 112 MCG PO CAPS: 1.0000 | ORAL_CAPSULE | Freq: Every day | ORAL | Status: DC

## 2014-09-17 NOTE — Patient Instructions (Addendum)
Terazosin stop. Recheck BP on Friday.  Start levothyroxine.  Make sure eating 3 meals a day or drinking Boost/ensure

## 2014-09-18 ENCOUNTER — Other Ambulatory Visit: Payer: Self-pay | Admitting: *Deleted

## 2014-09-18 ENCOUNTER — Telehealth: Payer: Self-pay | Admitting: Family Medicine

## 2014-09-18 DIAGNOSIS — L081 Erythrasma: Secondary | ICD-10-CM | POA: Insufficient documentation

## 2014-09-18 NOTE — Progress Notes (Signed)
   Subjective:    Patient ID: Bryan Hatfield, male    DOB: 1926/12/17, 81 y.o.   MRN: [id number]  HPI  Pt returns to the clinic for follow up after UC visit last week for weakness. Pt was not anemic. Labs were all normal. Patients   Lasix was in half to 20mg  MWF. Pt BP still remains low side. Pt admits he is not hungry and has lost approximately 11lbs in last year. He just feels dizzy and weak for last month. Was dx with subclinical hypothyroidism but has not started medication. No fever, chills, cough, nausea, vomiting, chest tightness. He does still have rash in groin. Pt was not able to pick up erythomycin due to pharmacy not getting?     Review of Systems  All other systems reviewed and are negative.      Objective:   Physical Exam  Constitutional: He is oriented to person, place, and time. He appears well-developed and well-nourished.  HENT:  Head: Normocephalic and atraumatic.  Cardiovascular: Normal rate, regular rhythm and normal heart sounds.   Pulmonary/Chest: Effort normal and breath sounds normal. He has no wheezes.  Neurological: He is alert and oriented to person, place, and time.  Skin:     Psychiatric: He has a normal mood and affect. His behavior is normal.          Assessment & Plan:  Subclinical hypothyroidism- discussed with patient to start levothyroxine and will see if helps energy and symptoms.    Weakness/hypotension- pt is off all BP medications. Pt lasix was decreased but I do not want to discontinue due to hx of fluid accumulating around lungs. Will hold terazosin until Friday when Dr. Madilyn Fireman has follow up with patient. Concerned pt is not getting enough nutrition due to decreased appetitie. If not going to eat needs to drink boost or ensure to supplement calories. Needs to be eating at least 3 meals a day.    erythemasma- was not able to  Pick up last rx resent to pharmacy erythromycin topical. Should be affordable.

## 2014-09-18 NOTE — Telephone Encounter (Signed)
Received fax for pa on levothyroxine sent through cover my meds waiting on auth. - CF

## 2014-09-18 NOTE — Telephone Encounter (Signed)
Received fax from [facility] and medication is on plans list of covered medications - CF

## 2014-09-21 ENCOUNTER — Ambulatory Visit: Payer: Medicare Other | Admitting: Family Medicine

## 2014-09-27 ENCOUNTER — Telehealth: Payer: Self-pay | Admitting: *Deleted

## 2014-09-27 NOTE — Telephone Encounter (Signed)
Try increassing the trazodone up to 3 tabs first to see if works better.

## 2014-09-27 NOTE — Telephone Encounter (Signed)
lvm informing pt's wife of recommendations.Bryan Hatfield [place]

## 2014-09-27 NOTE — Telephone Encounter (Signed)
trazodone not working would like Rx for Ambien to be sent to [facility].Bryan Hatfield [place]

## 2014-10-02 ENCOUNTER — Ambulatory Visit (INDEPENDENT_AMBULATORY_CARE_PROVIDER_SITE_OTHER): Payer: Medicare Other | Admitting: Family Medicine

## 2014-10-02 ENCOUNTER — Encounter: Payer: Self-pay | Admitting: Family Medicine

## 2014-10-02 VITALS — BP 125/62 | HR 65 | Temp 98.4°F | Wt 251.0 lb

## 2014-10-02 DIAGNOSIS — L309 Dermatitis, unspecified: Secondary | ICD-10-CM | POA: Diagnosis not present

## 2014-10-02 DIAGNOSIS — J449 Chronic obstructive pulmonary disease, unspecified: Secondary | ICD-10-CM | POA: Diagnosis not present

## 2014-10-02 DIAGNOSIS — J441 Chronic obstructive pulmonary disease with (acute) exacerbation: Secondary | ICD-10-CM | POA: Diagnosis not present

## 2014-10-02 MED ORDER — TRIAMCINOLONE ACETONIDE 0.1 % EX CREA: TOPICAL_CREAM | CUTANEOUS | Status: DC

## 2014-10-02 MED ORDER — PREDNISONE 20 MG PO TABS: ORAL_TABLET | ORAL | Status: AC

## 2014-10-02 NOTE — Progress Notes (Signed)
CC: Bryan Hatfield is a 81 y.o. male is here for cough and congestion   Subjective: HPI:  3 days of cough with wheezing that has been present all hours of the day but seems to be worse in the evenings. Cough is nonobstructive with mild shortness of breath. Accompanied by sore throat but no other symptoms. No benefit from cough drops no other interventions as of yet. Still using Symbicort no changes to any other medications recently. Denies fevers, chills, chest pain, blood in sputum  Points out a rash on his right flank that has been present for a few months now present on a daily basis itchy to a mild degree. No interventions as of yet. Worse when lying back on his back on his right side. Symptoms are not getting better or worse the last 3 months. Denies any rash elsewhere.   Review Of Systems Outlined In HPI  Past Medical History  Diagnosis Date  . CAD (coronary artery disease)     a. PTCA 2001. b. f/u cath 2008: mild nonobstructive CAD.  Marland Kitchen Hypertension   . Acute respiratory failure 09/25/11    hypoxic  . Peripheral vascular disease     patient denies knowledge of, but listed in his chart  . High cholesterol   . Anxiety   . Pleural effusion     Parapneumonic ?r/t PNA s/p thoracentesis 2008  . GERD (gastroesophageal reflux disease)   . Colon polyps   . COPD (chronic obstructive pulmonary disease)   . Pneumonia     hx  . Sleep apnea     cpap     Past Surgical History  Procedure Laterality Date  . Hemorrhoid surgery  1960's  . Coronary angioplasty  01/2000  . Cataract extraction w/ intraocular lens  implant, bilateral  ~ 2006  . Cholecystectomy N/A 10/11/2012    Procedure: LAPAROSCOPIC CHOLECYSTECTOMY WITH INTRAOPERATIVE CHOLANGIOGRAM;  Surgeon: Adin Hector, MD;  Location: [place];  Service: General;  Laterality: N/A;   Family History  Problem Relation Age of Onset  . Stroke Mother   . Arthritis Mother   . Asthma Mother   . Emphysema Mother   . Arthritis Sister   .  Stroke Sister   . Stroke Father     History   Social History  . Marital Status: Married    Spouse Name: N/A  . Number of Children: N/A  . Years of Education: N/A   Occupational History  . Retired     Surveyor, quantity - Civil engineer, [employer] and [employer]   Social History Main Topics  . Smoking status: Former Smoker -- 1.00 packs/day for 10 years    Types: Cigarettes    Quit date: 06/01/1982  . Smokeless tobacco: Never Used     Comment: "quit smoking ~ 25 years ago; smoked ~ 1 pk/wk"  . Alcohol Use: No  . Drug Use: No  . Sexual Activity: No   Other Topics Concern  . Not on file   Social History Narrative     Objective: BP 125/62 mmHg  Pulse 65  Temp(Src) 98.4 F (36.9 C)  Wt 251 lb (113.853 kg)  SpO2 95%  General: Alert and Oriented, No Acute Distress HEENT: Pupils equal, round, reactive to light. Conjunctivae clear.  External ears unremarkable, canals clear with intact TMs with appropriate landmarks.  Middle ear appears open without effusion. Pink inferior turbinates.  Moist mucous membranes, pharynx without inflammation nor lesions.  Neck supple without palpable lymphadenopathy nor abnormal masses. Lungs: Trace rhonchi with  no wheezing or rales. Comfortable work of breathing Cardiac: Regular rate and rhythm. Normal S1/S2.  No murmurs, rubs, nor gallops.   Extremities: No peripheral edema.  Strong peripheral pulses.  Mental Status: No depression, anxiety, nor agitation. Skin: Warm and dry. On the right flank and overlying right lower back there is a small cluster of hives  Assessment & Plan: Bryan Hatfield was seen today for cough and congestion.  Diagnoses and all orders for this visit:  COPD golds A   with recurrent tracheobronchitis and asthmatic bronchitic component  COPD exacerbation Orders: -     predniSONE (DELTASONE) 20 MG tablet; Three tabs at once daily for five days.  Dermatitis Orders: -     triamcinolone cream (KENALOG) 0.1 %; Apply to affected area(s)  twice a day for up to 2   weeks. Avoid face.   COPD exacerbation: Start prednisone no indication for antibiotics I believe he is suffering from a viral respiratory illness.Signs and symptoms requring emergent/urgent reevaluation were discussed with the patient. Dermatitis: Start triamcinolone cream to the right flank and right lower back on an as-needed basis after using it daily for 2 weeks    Return if symptoms worsen or fail to improve.

## 2014-10-18 ENCOUNTER — Telehealth: Payer: Self-pay | Admitting: *Deleted

## 2014-10-18 MED ORDER — AZITHROMYCIN 250 MG PO TABS: ORAL_TABLET | ORAL | Status: AC

## 2014-10-18 NOTE — Telephone Encounter (Signed)
Wife called and states pt is still not any better. ( he had appt on 10/02/14 with Hommel) He completed prednisone that was rx'ed. Pt was dx with a viral respiratory illness but still has a cough.Please advise

## 2014-10-18 NOTE — Telephone Encounter (Signed)
Azithromycin sent to [facility] on [address] in hopes that his helps his cough.

## 2014-10-18 NOTE — Telephone Encounter (Signed)
Pt's spouse notified.

## 2014-10-21 ENCOUNTER — Other Ambulatory Visit: Payer: Self-pay | Admitting: Family Medicine

## 2014-10-22 ENCOUNTER — Other Ambulatory Visit: Payer: Self-pay | Admitting: Family Medicine

## 2014-10-30 DIAGNOSIS — R05 Cough: Secondary | ICD-10-CM | POA: Diagnosis not present

## 2014-10-30 DIAGNOSIS — J209 Acute bronchitis, unspecified: Secondary | ICD-10-CM | POA: Diagnosis not present

## 2014-10-30 DIAGNOSIS — I517 Cardiomegaly: Secondary | ICD-10-CM | POA: Diagnosis not present

## 2014-11-07 ENCOUNTER — Telehealth: Payer: Self-pay | Admitting: *Deleted

## 2014-11-07 NOTE — Telephone Encounter (Signed)
Spoke w/pt's wife and she informed me that pt has been shaking really bad. Whenever he goes to hand her something his hands shake really hard. She said that he was tested some time ago for parkinsons which was negative. I scheduled an appt for him on 6.22.2016.Audelia Hives [place]

## 2014-11-21 ENCOUNTER — Encounter: Payer: Self-pay | Admitting: Family Medicine

## 2014-11-21 ENCOUNTER — Ambulatory Visit (INDEPENDENT_AMBULATORY_CARE_PROVIDER_SITE_OTHER): Payer: Medicare Other | Admitting: Family Medicine

## 2014-11-21 VITALS — BP 128/70 | HR 56 | Ht 71.0 in | Wt 249.0 lb

## 2014-11-21 DIAGNOSIS — F411 Generalized anxiety disorder: Secondary | ICD-10-CM | POA: Diagnosis not present

## 2014-11-21 DIAGNOSIS — L821 Other seborrheic keratosis: Secondary | ICD-10-CM | POA: Diagnosis not present

## 2014-11-21 DIAGNOSIS — E039 Hypothyroidism, unspecified: Secondary | ICD-10-CM

## 2014-11-21 DIAGNOSIS — I10 Essential (primary) hypertension: Secondary | ICD-10-CM | POA: Diagnosis not present

## 2014-11-21 DIAGNOSIS — G4700 Insomnia, unspecified: Secondary | ICD-10-CM

## 2014-11-21 LAB — TSH: TSH: 1.851 u[IU]/mL (ref 0.350–4.500)

## 2014-11-21 MED ORDER — SERTRALINE HCL 100 MG PO TABS: 150.0000 mg | ORAL_TABLET | Freq: Every day | ORAL | Status: DC

## 2014-11-21 NOTE — Progress Notes (Signed)
   Subjective:    Patient ID: Bryan Hatfield, male    DOB: 11-Mar-1927, 81 y.o.   MRN: [id number]  HPI   Has been feeling really nervous.  Says he feels nervous nearly every day and has been easily annoyed and irritable nearly every day. He feels like he has not been able to sleep because of his worrying more than half the days. Rates of symptoms is very difficult.   Insomnia - He still having a lot of difficulty with sleep. He does take 2 tabs trazodone at bedtime. He had called the office and we had told him that it was okay to increase to 3 if he wanted to try it that he never did.   He also has a skin tag/mole on his shoulder that he would like me to look at today.  Hypothyroidism-his TSH has been elevated over the last couple of years. His last TSH was 6.2 about 2 months ago. We decided to go ahead and start him on thyroid hormone replacement. He has now been on the medication for about 4 weeks.  Review of Systems     Objective:   Physical Exam  Constitutional: He appears well-developed and well-nourished.  HENT:  Head: Normocephalic and atraumatic.  Skin: Skin is warm and dry.  Psychiatric: He has a normal mood and affect. His behavior is normal.     Anterior right shoulder with approximately 0.6 x 1 cm hyper keratotic seborrheic keratosis with a medium brown color.     Assessment & Plan:  anxiety- inc sertraline to 150mg  for one month and then can f/u in 1 months.  If it's not really helping his symptoms and can consider changing medications completely.  Hypothyroidism-due to recheck thyroid level. He's been on current regimen for at least 6 weeks. We can adjust the days upper down based on his levels.  Insomnia-continue trazodone. Discuss with him to get his anxiety under better control will likely helplessly. He can try it 3 times a trazodone if he was light. That certainly up to him.  HTN - well-controlled. Blood pressure looks absolutely fantastic.  Seborrheic keratosis  on right anterior shoulder-gave reassurance that this is a benign lesion. He would like it treated. Cryotherapy perform. Patient tolerated well.  Reviewed cholesterol labs with him.   Cryotherapy Procedure Note  Pre-operative Diagnosis: seborrheic keratosis  Post-operative Diagnosis: same  Locations: right anterior shoulder  Indications: irritation  Procedure Details  Patient informed of risks (permanent scarring, infection, light or dark discoloration, bleeding, infection, weakness, numbness and recurrence of the lesion) and benefits of the procedure and verbal informed consent obtained.  The areas are treated with liquid nitrogen therapy, frozen until ice ball extended 2 mm beyond lesion, allowed to thaw, and treated again. The patient tolerated procedure well.  The patient was instructed on post-op care, warned that there may be blister formation, redness and pain. Recommend OTC analgesia as needed for pain.  Condition: Stable  Complications: none.  Plan: 1. Instructed to keep the area dry and covered for 24-48h and clean thereafter. 2. Warning signs of infection were reviewed.   3. Recommended that the patient use OTC acetaminophen as needed for pain.  4. Return PRN.

## 2014-12-25 ENCOUNTER — Other Ambulatory Visit: Payer: Self-pay | Admitting: Family Medicine

## 2014-12-25 NOTE — Telephone Encounter (Signed)
This hasn't been written since Jan of last year.

## 2015-01-08 ENCOUNTER — Encounter: Payer: Self-pay | Admitting: Family Medicine

## 2015-01-08 ENCOUNTER — Ambulatory Visit (INDEPENDENT_AMBULATORY_CARE_PROVIDER_SITE_OTHER): Payer: Medicare Other | Admitting: Family Medicine

## 2015-01-08 VITALS — BP 120/65 | HR 77 | Ht 71.0 in | Wt 252.0 lb

## 2015-01-08 DIAGNOSIS — G4700 Insomnia, unspecified: Secondary | ICD-10-CM

## 2015-01-08 DIAGNOSIS — F411 Generalized anxiety disorder: Secondary | ICD-10-CM | POA: Diagnosis not present

## 2015-01-08 DIAGNOSIS — L989 Disorder of the skin and subcutaneous tissue, unspecified: Secondary | ICD-10-CM | POA: Diagnosis not present

## 2015-01-08 DIAGNOSIS — E039 Hypothyroidism, unspecified: Secondary | ICD-10-CM | POA: Diagnosis not present

## 2015-01-08 MED ORDER — FLUOXETINE HCL (PMDD) 20 MG PO CAPS: 1.0000 | ORAL_CAPSULE | Freq: Every day | ORAL | Status: DC

## 2015-01-08 MED ORDER — CICLOPIROX 8 % EX SOLN
Freq: Every day | CUTANEOUS | Status: DC
Start: 2015-01-08 — End: 2015-04-12

## 2015-01-08 NOTE — Progress Notes (Addendum)
   Subjective:    Patient ID: Bryan Hatfield, male    DOB: August 27, 1926, 81 y.o.   MRN: [id number]  HPI Follow-up anxiety-I last saw him about 5 weeks ago it increases or chewing 250 mg. Following up today to see if his been helpful for his symptoms or not. He had not been sleeping well but we had held off on adjusting his trazodone until we can get his anxiety under better control. He wife is going through a bought of depression right now and that has been difficult   Hypothyroidism-ray rechecked his thyroid level at last office visit in June. TSH looked great on current regimen. We'll recheck again in September.   Insomnia - Says he is still not sleeping well. Now on trazodone 100mg  Says often can't fall asleep until 3 or 4AM. Will lay in bed for hours.  Tries to go to tbed around 10 PM.    He has a lesion on face, below his lips. He says his been there for several months. Doesn't bother him as far as itching or irritation. But when he shaves he typically hits it and then it bleeds a lot.  Review of Systems     Objective:   Physical Exam  Constitutional: He is oriented to person, place, and time. He appears well-developed and well-nourished.  HENT:  Head: Normocephalic and atraumatic.  Cardiovascular: Normal rate, regular rhythm and normal heart sounds.   Pulmonary/Chest: Effort normal and breath sounds normal.  Neurological: He is alert and oriented to person, place, and time.  Skin: Skin is warm and dry.  Psychiatric: He has a normal mood and affect. His behavior is normal.   On face below his lip his has a small 2-3 mm pink papular lesion with irregular texture.       Assessment & Plan:  Generalized anxiety disorder-gad 7 score of 9 today, previous of 8.  Will wean the sertraline and start fluoxetine instead.  F/U in 6 weeks.   Insomnia - move trazodone to about 2 hours before bedtime. He is now taking 100mg .  Get out of bed if laying there for more than 20 min.  Doing something like  reading and when feel tied then try to lay down again. F/u in 6 weeks.   Hypothyroidism-we'll recheck level at the end of September/early October.  Atypical lesional on face-wart versus early basal cell. Treat with a MIC 1 mode cream for 6 weeks. prescription sent to pharmacy.

## 2015-01-08 NOTE — Patient Instructions (Addendum)
Decrease the Zoloft  (sertraline) to 1 pill daily for 5 days, then decrease to half a tab daily for 7 days and then stop that medicine and will start a new one.      Insomnia Insomnia is frequent trouble falling and/or staying asleep. Insomnia can be a long term problem or a short term problem. Both are common. Insomnia can be a short term problem when the wakefulness is related to a certain stress or worry. Long term insomnia is often related to ongoing stress during waking hours and/or poor sleeping habits. Overtime, sleep deprivation itself can make the problem worse. Every little thing feels more severe because you are overtired and your ability to cope is decreased. CAUSES   Stress, anxiety, and depression.  Poor sleeping habits.  Distractions such as TV in the bedroom.  Naps close to bedtime.  Engaging in emotionally charged conversations before bed.  Technical reading before sleep.  Alcohol and other sedatives. They may make the problem worse. They can hurt normal sleep patterns and normal dream activity.  Stimulants such as caffeine for several hours prior to bedtime.  Pain syndromes and shortness of breath can cause insomnia.  Exercise late at night.  Changing time zones may cause sleeping problems (jet lag). It is sometimes helpful to have someone observe your sleeping patterns. They should look for periods of not breathing during the night (sleep apnea). They should also look to see how long those periods last. If you live alone or observers are uncertain, you can also be observed at a sleep clinic where your sleep patterns will be professionally monitored. Sleep apnea requires a checkup and treatment. Give your caregivers your medical history. Give your caregivers observations your family has made about your sleep.  SYMPTOMS   Not feeling rested in the morning.  Anxiety and restlessness at bedtime.  Difficulty falling and staying asleep. TREATMENT   Your caregiver  may prescribe treatment for an underlying medical disorders. Your caregiver can give advice or help if you are using alcohol or other drugs for self-medication. Treatment of underlying problems will usually eliminate insomnia problems.  Medications can be prescribed for short time use. They are generally not recommended for lengthy use.  Over-the-counter sleep medicines are not recommended for lengthy use. They can be habit forming.  You can promote easier sleeping by making lifestyle changes such as:  Using relaxation techniques that help with breathing and reduce muscle tension.  Exercising earlier in the day.  Changing your diet and the time of your last meal. No night time snacks.  Establish a regular time to go to bed.  Counseling can help with stressful problems and worry.  Soothing music and white noise may be helpful if there are background noises you cannot remove.  Stop tedious detailed work at least one hour before bedtime. HOME CARE INSTRUCTIONS   Keep a diary. Inform your caregiver about your progress. This includes any medication side effects. See your caregiver regularly. Take note of:  Times when you are asleep.  Times when you are awake during the night.  The quality of your sleep.  How you feel the next day. This information will help your caregiver care for you.  Get out of bed if you are still awake after 15 minutes. Read or do some quiet activity. Keep the lights down. Wait until you feel sleepy and go back to bed.  Keep regular sleeping and waking hours. Avoid naps.  Exercise regularly.  Avoid distractions at bedtime. Distractions  include watching television or engaging in any intense or detailed activity like attempting to balance the household checkbook.  Develop a bedtime ritual. Keep a familiar routine of bathing, brushing your teeth, climbing into bed at the same time each night, listening to soothing music. Routines increase the success of falling  to sleep faster.  Use relaxation techniques. This can be using breathing and muscle tension release routines. It can also include visualizing peaceful scenes. You can also help control troubling or intruding thoughts by keeping your mind occupied with boring or repetitive thoughts like the old concept of counting sheep. You can make it more creative like imagining planting one beautiful flower after another in your backyard garden.  During your day, work to eliminate stress. When this is not possible use some of the previous suggestions to help reduce the anxiety that accompanies stressful situations. MAKE SURE YOU:   Understand these instructions.  Will watch your condition.  Will get help right away if you are not doing well or get worse. Document Released: 05/15/2000 Document Revised: 08/10/2011 Document Reviewed: 06/15/2007 [facility] Patient Information 2015 [place], [place]. This information is not intended to replace advice given to you by your health care provider. Make sure you discuss any questions you have with your health care provider.

## 2015-01-09 MED ORDER — IMIQUIMOD 5 % EX CREA: TOPICAL_CREAM | Freq: Every day | CUTANEOUS | Status: DC

## 2015-01-09 NOTE — Addendum Note (Signed)
Addended by: Beatrice Lecher D on: 01/09/2015 09:58 AM   Modules accepted: Orders

## 2015-01-22 ENCOUNTER — Ambulatory Visit (INDEPENDENT_AMBULATORY_CARE_PROVIDER_SITE_OTHER): Payer: Medicare Other | Admitting: *Deleted

## 2015-01-22 DIAGNOSIS — Z23 Encounter for immunization: Secondary | ICD-10-CM

## 2015-01-29 ENCOUNTER — Other Ambulatory Visit: Payer: Self-pay | Admitting: Family Medicine

## 2015-02-19 ENCOUNTER — Ambulatory Visit (INDEPENDENT_AMBULATORY_CARE_PROVIDER_SITE_OTHER): Payer: Medicare Other | Admitting: Family Medicine

## 2015-02-19 ENCOUNTER — Encounter: Payer: Self-pay | Admitting: Family Medicine

## 2015-02-19 VITALS — BP 131/54 | HR 63 | Wt 255.0 lb

## 2015-02-19 DIAGNOSIS — F411 Generalized anxiety disorder: Secondary | ICD-10-CM

## 2015-02-19 DIAGNOSIS — L989 Disorder of the skin and subcutaneous tissue, unspecified: Secondary | ICD-10-CM | POA: Diagnosis not present

## 2015-02-19 DIAGNOSIS — M79676 Pain in unspecified toe(s): Secondary | ICD-10-CM

## 2015-02-19 DIAGNOSIS — G4700 Insomnia, unspecified: Secondary | ICD-10-CM | POA: Diagnosis not present

## 2015-02-19 DIAGNOSIS — F5101 Primary insomnia: Secondary | ICD-10-CM | POA: Insufficient documentation

## 2015-02-19 NOTE — Progress Notes (Signed)
   Subjective:    Patient ID: Bryan Hatfield, male    DOB: [birth year], 81 y.o.   MRN: [id number]  HPI 6 week follow-up for generalized anxiety - I last saw him we decided to switch him to fluoxetine to see if it was more effective. His wife is doing much better emotionall as well and says that has helped as well. When she gets very depressed and doesn't want to get out of the house it just really affects his mood as well.  Lesion on face is scabbed. He has been applying the imiquimod.    Toe pain - he's been having some intermittent sharp pains in his toes. He did call the [facility] to get in with the foot doctor but has not heard back from them. He says he plans to call again but if he's not able to get and then he wants to know if he could have a referral through [place].  Insomnia - has been a lot better overall. Feels like the improvement in mood has hlped as well and taking the trazodone regularly .   Review of Systems     Objective:   Physical Exam  Constitutional: He is oriented to person, place, and time. He appears well-developed and well-nourished.  HENT:  Head: Normocephalic and atraumatic.  Cardiovascular: Normal rate, regular rhythm and normal heart sounds.   Pulmonary/Chest: Effort normal and breath sounds normal.  Neurological: He is alert and oriented to person, place, and time.  Skin: Skin is warm and dry.  approx 1 cm scab just below lower lip.  Psychiatric: He has a normal mood and affect. His behavior is normal.          Assessment & Plan:  GAD- he is doing well on fluoxetine. Continue current regimen.Marland Kitchen  GAD- 7 score of 3 which looks great. Follow-up in 3 months.  Lesion on face is scabbed over right now. No sign of infection. Did remind him not to pick or scratch of the lesion. At this point he can stop the metformin since he has done a full 6 week course and let it heal. Keep the rest of the imiquimod since it is expensive.  Toe pain-happy to make referral to podiatry if  he is not able to get in at the [facility].  Insomnia-well-controlled currently. Continue trazodone.

## 2015-03-01 NOTE — Progress Notes (Signed)
   Subjective:    Patient ID: Bryan Hatfield, male    DOB: June 30, 1926, 81 y.o.   MRN: [id number]  HPI    Review of Systems     Objective:   Physical Exam        Assessment & Plan:  Flu vaccine given. Tolerated well.

## 2015-03-20 DIAGNOSIS — J209 Acute bronchitis, unspecified: Secondary | ICD-10-CM | POA: Diagnosis not present

## 2015-03-20 DIAGNOSIS — R05 Cough: Secondary | ICD-10-CM | POA: Diagnosis not present

## 2015-03-27 ENCOUNTER — Ambulatory Visit (INDEPENDENT_AMBULATORY_CARE_PROVIDER_SITE_OTHER): Payer: Medicare Other | Admitting: Osteopathic Medicine

## 2015-03-27 ENCOUNTER — Ambulatory Visit (INDEPENDENT_AMBULATORY_CARE_PROVIDER_SITE_OTHER): Payer: Medicare Other

## 2015-03-27 ENCOUNTER — Encounter: Payer: Self-pay | Admitting: Osteopathic Medicine

## 2015-03-27 VITALS — BP 122/64 | HR 68 | Temp 98.1°F | Wt 257.0 lb

## 2015-03-27 DIAGNOSIS — Z8709 Personal history of other diseases of the respiratory system: Secondary | ICD-10-CM | POA: Diagnosis not present

## 2015-03-27 DIAGNOSIS — R059 Cough, unspecified: Secondary | ICD-10-CM

## 2015-03-27 DIAGNOSIS — R05 Cough: Secondary | ICD-10-CM

## 2015-03-27 DIAGNOSIS — J449 Chronic obstructive pulmonary disease, unspecified: Secondary | ICD-10-CM | POA: Diagnosis not present

## 2015-03-27 NOTE — Progress Notes (Signed)
HPI: Bryan Hatfield is a 81 y.o. male who presents to [facility]  today for chief complaint of:  Chief Complaint  Patient presents with  . Cough    Cough is still bothering him.  . Location: chest/sinuses . Quality: cough/congestion, sneezing . Severity: moderate . Duration: 1 week . Context & Modifying Factors: Seen at [facility] last week, treated with steroids, Doxy and tessalon for cough/bronchitis. Records reviewed: CXR normal. COPD diagnosis, was on inhaler years ago but can't remember the name of this. . Assoc signs/symptoms: no fever/chills   Past medical, social and family history reviewed: Past Medical History  Diagnosis Date  . CAD (coronary artery disease)     a. PTCA 2001. b. f/u cath 2008: mild nonobstructive CAD.  Marland Kitchen Hypertension   . Acute respiratory failure ([place]) 09/25/11    hypoxic  . Peripheral vascular disease ([place])     patient denies knowledge of, but listed in his chart  . High cholesterol   . Anxiety   . Pleural effusion     Parapneumonic ?r/t PNA s/p thoracentesis 2008  . GERD (gastroesophageal reflux disease)   . Colon polyps   . COPD (chronic obstructive pulmonary disease) ([place])   . Pneumonia     hx  . Sleep apnea     cpap    Past Surgical History  Procedure Laterality Date  . Hemorrhoid surgery  1960's  . Coronary angioplasty  01/2000  . Cataract extraction w/ intraocular lens  implant, bilateral  ~ 2006  . Cholecystectomy N/A 10/11/2012    Procedure: LAPAROSCOPIC CHOLECYSTECTOMY WITH INTRAOPERATIVE CHOLANGIOGRAM;  Surgeon: Adin Hector, MD;  Location: [facility];  Service: General;  Laterality: N/A;   Social History  Substance Use Topics  . Smoking status: Former Smoker -- 1.00 packs/day for 10 years    Types: Cigarettes    Quit date: 06/01/1982  . Smokeless tobacco: Never Used     Comment: "quit smoking ~ 25 years ago; smoked ~ 1 pk/wk"  . Alcohol Use: No   Family History  Problem Relation Age of Onset  .  Stroke Mother   . Arthritis Mother   . Asthma Mother   . Emphysema Mother   . Arthritis Sister   . Stroke Sister   . Stroke Father     Current Outpatient Prescriptions  Medication Sig Dispense Refill  . Ascorbic Acid (VITAMIN C PO) Take 1 tablet by mouth daily.    Marland Kitchen aspirin EC 81 MG tablet Take 81 mg by mouth daily.      . benzonatate (TESSALON PERLES) 100 MG capsule Take 100 mg by mouth.    . benzonatate (TESSALON) 100 MG capsule Take by mouth 3 (three) times daily as needed for cough.    . benzoyl peroxide-erythromycin (BENZAMYCIN) gel Apply topically 2 (two) times daily. 23.3 g 1  . ciclopirox (PENLAC) 8 % solution Apply topically at bedtime. Apply over nail and surrounding skin. Apply daily over previous coat. After seven (7) days, may remove with alcohol and continue cycle. 6.6 mL 11  . FLUoxetine (PROZAC) 20 MG capsule TK 1 C PO D  2  . furosemide (LASIX) 40 MG tablet Take 1 tablet by mouth  every other day Usually on  Monday,Wednesday,Friday 45 tablet 1  . imiquimod (ALDARA) 5 % cream Apply topically at bedtime. 12 each 1  . levothyroxine (SYNTHROID, LEVOTHROID) 112 MCG tablet TAKE 1 TABLET(112 MCG) BY MOUTH DAILY BEFORE BREAKFAST 30 tablet 2  . Multiple Vitamins-Minerals (  ICAPS MV PO) Take 1 capsule by mouth daily.    Marland Kitchen NITROSTAT 0.4 MG SL tablet Dissolve 1 tablet under the tongue every 5 minutes as  needed chest pain 25 tablet 1  . simvastatin (ZOCOR) 80 MG tablet Take 1 tablet by mouth  every evening 90 tablet 2  . traZODone (DESYREL) 50 MG tablet Take 1 tablet by mouth at  bedtime 90 tablet 2  . triamcinolone cream (KENALOG) 0.1 % Apply to affected area(s)  twice a day for up to 2  weeks. Avoid face. 80 g 5   No current facility-administered medications for this visit.   No Known Allergies    Review of Systems: CONSTITUTIONAL:  No  fever, no chills, No  unintentional weight changes HEAD/EYES/EARS/NOSE/THROAT: No headache, no vision change, no hearing change, No  sore  throat CARDIAC: No chest pain, no pressure/palpitations, no orthopnea RESPIRATORY: Yes  cough, No  shortness of breath/wheeze GASTROINTESTINAL: No nausea, no vomiting, no abdominal pain, MUSCULOSKELETAL: No  myalgia/arthralgia NEUROLOGIC: No weakness, no dizziness, no slurred speech   Exam:  BP 122/64 mmHg  Pulse 68  Temp(Src) 98.1 F (36.7 C) (Oral)  Wt 257 lb (116.574 kg)  SpO2 95% Constitutional: VSS, see above. General Appearance: alert, well-developed, well-nourished, NAD Eyes: Normal lids and conjunctive, non-icteric sclera, PERRLA Ears, Nose, Mouth, Throat: Normal external inspection ears/nares/mouth/lips/gums, MMM, posterior pharynx No  erythema No exudate Respiratory: Normal respiratory effort. no wheeze, no rhonchi, no rales, diminished breath sounds bilaterally Cardiovascular: S1/S2 normal, no murmur, no rub/gallop auscultated. RRR. No lower extremity edema. Musculoskeletal: Gait normal. No clubbing/cyanosis of digits.  Psychiatric: Normal judgment/insight. Normal mood and affect. Oriented x3.    No results found for this or any previous visit (from the past 72 hour(s)).  CXR repeated today. Personally reviewed. No mass/infiltrate. ENlarged heart appears stable. Await radioogy overread    ASSESSMENT/PLAN:  Cough - Plan: DG Chest 2 View  History of bronchitis  COPD golds A   with recurrent tracheobronchitis and asthmatic bronchitic component   Pt declines inhaler, stating these are too expensive. Advised he address this further with Dr Madilyn Fireman at his followup since he does have OCPD diagnosis and may certainly benefit from inhaled therapy. Reassured that if no fever/chills, no SOB, no hemoptysis or significant productive cough - can expect mild cough to linger another week or two as bronchitis exacerbation clears. Any concerns, come back ASAP, and ER precautions reviewed. Declines refill on Tessalon.   No Follow-up on file.

## 2015-04-04 DIAGNOSIS — J309 Allergic rhinitis, unspecified: Secondary | ICD-10-CM | POA: Diagnosis not present

## 2015-04-04 DIAGNOSIS — J449 Chronic obstructive pulmonary disease, unspecified: Secondary | ICD-10-CM | POA: Diagnosis not present

## 2015-04-12 ENCOUNTER — Ambulatory Visit (INDEPENDENT_AMBULATORY_CARE_PROVIDER_SITE_OTHER): Payer: Medicare Other | Admitting: Family Medicine

## 2015-04-12 ENCOUNTER — Encounter: Payer: Self-pay | Admitting: Family Medicine

## 2015-04-12 ENCOUNTER — Other Ambulatory Visit: Payer: Self-pay | Admitting: Family Medicine

## 2015-04-12 VITALS — BP 118/56 | HR 72 | Temp 98.6°F | Resp 18 | Wt 249.1 lb

## 2015-04-12 DIAGNOSIS — Z0000 Encounter for general adult medical examination without abnormal findings: Secondary | ICD-10-CM

## 2015-04-12 DIAGNOSIS — J449 Chronic obstructive pulmonary disease, unspecified: Secondary | ICD-10-CM | POA: Diagnosis not present

## 2015-04-12 DIAGNOSIS — J440 Chronic obstructive pulmonary disease with acute lower respiratory infection: Secondary | ICD-10-CM

## 2015-04-12 DIAGNOSIS — L309 Dermatitis, unspecified: Secondary | ICD-10-CM | POA: Diagnosis not present

## 2015-04-12 DIAGNOSIS — J209 Acute bronchitis, unspecified: Secondary | ICD-10-CM

## 2015-04-12 MED ORDER — HYDROCODONE-HOMATROPINE 5-1.5 MG/5ML PO SYRP: 5.0000 mL | ORAL_SOLUTION | Freq: Every evening | ORAL | Status: DC | PRN

## 2015-04-12 MED ORDER — TRIAMCINOLONE ACETONIDE 0.1 % EX CREA: TOPICAL_CREAM | CUTANEOUS | Status: DC

## 2015-04-12 NOTE — Progress Notes (Signed)
Subjective:    Patient ID: Bryan Hatfield, male    DOB: 12/05/1926, 81 y.o.   MRN: [id number]  HPI Had a bronchitis and COPD exacerbation that started 3 weeks ago. Went to UC first and then wasn't gtting better so went to see Dr. Sheppard Coil. No fever, chills, or sweats.  Still coughing, productive.  Light green. Cough is better.  Says given an inhaler but it was $300.  Still on prednisone.   Dry patch on left upper arm - he's had dry itchy skin there for a couple of weeks. He is out of his previous prescription for triamcinolone.   Review of Systems     Objective:   Physical Exam  Constitutional: He is oriented to person, place, and time. He appears well-developed and well-nourished.  HENT:  Head: Normocephalic and atraumatic.  Right Ear: External ear normal.  Left Ear: External ear normal.  Nose: Nose normal.  Mouth/Throat: Oropharynx is clear and moist.  TMs and canals are clear.   Eyes: Conjunctivae and EOM are normal. Pupils are equal, round, and reactive to light.  Neck: Neck supple. No thyromegaly present.  Cardiovascular: Normal rate and normal heart sounds.   Pulmonary/Chest: Effort normal and breath sounds normal.  Lymphadenopathy:    He has no cervical adenopathy.  Neurological: He is alert and oriented to person, place, and time.  Skin: Skin is warm and dry.  Dry scaling patch on left upper arm with some excoriations.   Psychiatric: He has a normal mood and affect.          Assessment & Plan:    Subjective:  COPD exacerbation - I think overall he is getting better. Lungs are completely clear today. And it sounds like he is better than it was. He is not running a fever. He is not wheezing on exam. Given samples of Symbicort today. Make sure complete the prednisone. He says he has a couple more days left. I did print a prescription for cough syrup at bedtime. Use sparingly.  Eczema - will treat with triamcinolone. New prescription sent to pharmacy. Recommend use  once a day instead of twice a day as is equally effective and doesn't use as much cream.    Bryan Hatfield is a 81 y.o. male who presents for Medicare Annual/Subsequent preventive examination.   Preventive Screening-Counseling & Management  Tobacco History  Smoking status  . Former Smoker -- 1.00 packs/day for 10 years  . Types: Cigarettes  . Quit date: 06/01/1982  Smokeless tobacco  . Never Used    Comment: "quit smoking ~ 25 years ago; smoked ~ 1 pk/wk"    Problems Prior to Visit 1.   Current Problems (verified) Patient Active Problem List   Diagnosis Date Noted  . Insomnia 02/19/2015  . Thyroid activity decreased 11/21/2014  . GAD (generalized anxiety disorder) 11/21/2014  . Erythrasma 09/18/2014  . Weakness 09/17/2014  . Arterial hypotension 09/17/2014  . Subclinical hypothyroidism 09/07/2014  . Episode of dizziness 08/30/2014  . Prediabetes 06/08/2013  . Essential hypertension, benign 06/02/2013  . Hyperlipidemia 06/02/2013  . Depression 06/02/2013  . Gallstones and inflammation of gallbladder without obstruction 09/23/2012  . Coronary artery disease 05/09/2012  . Obesity 09/25/2011  . INSOMNIA UNSPECIFIED 09/21/2007  . COPD golds A   with recurrent tracheobronchitis and asthmatic bronchitic component 09/21/2007  . Sleep apnea 09/20/2007    Medications Prior to Visit Current Outpatient Prescriptions on File Prior to Visit  Medication Sig Dispense Refill  . Ascorbic Acid (  VITAMIN C PO) Take 1 tablet by mouth daily.    Marland Kitchen aspirin EC 81 MG tablet Take 81 mg by mouth daily.      . benzoyl peroxide-erythromycin (BENZAMYCIN) gel Apply topically 2 (two) times daily. 23.3 g 1  . ciclopirox (PENLAC) 8 % solution Apply topically at bedtime. Apply over nail and surrounding skin. Apply daily over previous coat. After seven (7) days, may remove with alcohol and continue cycle. 6.6 mL 11  . FLUoxetine (PROZAC) 20 MG capsule TK 1 C PO D  2  . furosemide (LASIX) 40 MG tablet  Take 1 tablet by mouth  every other day Usually on  Monday,Wednesday,Friday 45 tablet 1  . imiquimod (ALDARA) 5 % cream Apply topically at bedtime. 12 each 1  . Multiple Vitamins-Minerals (ICAPS MV PO) Take 1 capsule by mouth daily.    Marland Kitchen NITROSTAT 0.4 MG SL tablet Dissolve 1 tablet under the tongue every 5 minutes as  needed chest pain 25 tablet 1  . simvastatin (ZOCOR) 80 MG tablet Take 1 tablet by mouth  every evening 90 tablet 2  . traZODone (DESYREL) 50 MG tablet Take 1 tablet by mouth at  bedtime 90 tablet 2   No current facility-administered medications on file prior to visit.    Current Medications (verified) Current Outpatient Prescriptions  Medication Sig Dispense Refill  . Ascorbic Acid (VITAMIN C PO) Take 1 tablet by mouth daily.    Marland Kitchen aspirin EC 81 MG tablet Take 81 mg by mouth daily.      . benzoyl peroxide-erythromycin (BENZAMYCIN) gel Apply topically 2 (two) times daily. 23.3 g 1  . ciclopirox (PENLAC) 8 % solution Apply topically at bedtime. Apply over nail and surrounding skin. Apply daily over previous coat. After seven (7) days, may remove with alcohol and continue cycle. 6.6 mL 11  . FLUoxetine (PROZAC) 20 MG capsule TK 1 C PO D  2  . furosemide (LASIX) 40 MG tablet Take 1 tablet by mouth  every other day Usually on  Monday,Wednesday,Friday 45 tablet 1  . imiquimod (ALDARA) 5 % cream Apply topically at bedtime. 12 each 1  . levothyroxine (SYNTHROID, LEVOTHROID) 112 MCG tablet TAKE 1 TABLET(112 MCG) BY MOUTH DAILY BEFORE BREAKFAST 90 tablet 3  . Multiple Vitamins-Minerals (ICAPS MV PO) Take 1 capsule by mouth daily.    Marland Kitchen NITROSTAT 0.4 MG SL tablet Dissolve 1 tablet under the tongue every 5 minutes as  needed chest pain 25 tablet 1  . simvastatin (ZOCOR) 80 MG tablet Take 1 tablet by mouth  every evening 90 tablet 2  . traZODone (DESYREL) 50 MG tablet Take 1 tablet by mouth at  bedtime 90 tablet 2  . triamcinolone cream (KENALOG) 0.1 % Apply to affected area(s)  Daily PRN  for up to 2  weeks. Avoid face. 80 g 2  . HYDROcodone-homatropine (HYCODAN) 5-1.5 MG/5ML syrup Take 5 mLs by mouth at bedtime as needed for cough. 180 mL 0   No current facility-administered medications for this visit.     Allergies (verified) Review of patient's allergies indicates no known allergies.   PAST HISTORY  Family History Family History  Problem Relation Age of Onset  . Stroke Mother   . Arthritis Mother   . Asthma Mother   . Emphysema Mother   . Arthritis Sister   . Stroke Sister   . Stroke Father     Social History Social History  Substance Use Topics  . Smoking status: Former Smoker -- 1.00 packs/day  for 10 years    Types: Cigarettes    Quit date: 06/01/1982  . Smokeless tobacco: Never Used     Comment: "quit smoking ~ 25 years ago; smoked ~ 1 pk/wk"  . Alcohol Use: No    Are there smokers in your home (other than you)?  No  Risk Factors Current exercise habits: The patient does not participate in regular exercise at present.  Dietary issues discussed: None   Cardiac risk factors: advanced age (older than 91 for men, 24 for women) and hypertension.  Depression Screen (Note: if answer to either of the following is "Yes", a more complete depression screening is indicated)   Q1: Over the past two weeks, have you felt down, depressed or hopeless? No  Q2: Over the past two weeks, have you felt little interest or pleasure in doing things? Yes  Have you lost interest or pleasure in daily life? No  Do you often feel hopeless? No  Do you cry easily over simple problems? No  Activities of Daily Living In your present state of health, do you have any difficulty performing the following activities?:  Driving? No Managing money?  No Feeding yourself? Yes Getting from bed to chair? No Climbing a flight of stairs? No Preparing food and eating?: Yes Bathing or showering? No Getting dressed: No Getting to the toilet? No Using the toilet:No Moving around from  place to place: No In the past year have you fallen or had a near fall?:No   Are you sexually active?  No  Do you have more than one partner?  No  Hearing Difficulties: No Do you often ask people to speak up or repeat themselves? No Do you experience ringing or noises in your ears? No Do you have difficulty understanding soft or whispered voices? No   Do you feel that you have a problem with memory? No  Do you often misplace items? No  Do you feel safe at home?  Yes  Cognitive Testing  Alert? Yes  Normal Appearance?Yes  Oriented to person? Yes  Place? Yes   Time? Yes  Recall of three objects?  Yes  Can perform simple calculations? Yes  Displays appropriate judgment?Yes  Can read the correct time from a watch face?Yes    6 CIT score of 4/28 (normal)    Advanced Directives have been discussed with the patient? Yes   List the Names of Other Physician/Practitioners you currently use: 1.    Indicate any recent Medical Services you may have received from other than [facility] providers in the past year (date may be approximate).  Immunization History  Administered Date(s) Administered  . Influenza Split 01/31/2012  . Influenza Whole 03/16/2008, 03/01/2013  . Influenza,inj,Quad PF,36+ Mos 01/22/2015  . Pneumococcal Conjugate-13 12/21/2013  . Pneumococcal Polysaccharide-23 03/15/2001  . Pneumococcal-Unspecified 01/31/2008  . Tdap 06/01/2005, 07/01/2011  . Zoster 06/01/2012    Screening Tests Health Maintenance  Topic Date Due  . INFLUENZA VACCINE  12/31/2015  . TETANUS/TDAP  06/30/2021  . ZOSTAVAX  Completed  . PNA vac Low Risk Adult  Completed    All answers were reviewed with the patient and necessary referrals were made:  Staley Lunz, MD   04/12/2015   History reviewed: allergies, current medications, past family history, past medical history, past social history, past surgical history and problem list  Review of Systems A comprehensive review of systems was  negative.    Objective:     Vision by Snellen chart: Eye exam is up to date.  Blood pressure 123/49, pulse 72, temperature 98.6 F (37 C), temperature source Oral, resp. rate 18, weight 249 lb 1.6 oz (112.991 kg), SpO2 97 %. Body mass index is 34.76 kg/(m^2).  BP 123/49 mmHg  Pulse 72  Temp(Src) 98.6 F (37 C) (Oral)  Resp 18  Wt 249 lb 1.6 oz (112.991 kg)  SpO2 97% General appearance: alert, cooperative and appears stated age Head: Normocephalic, without obvious abnormality, atraumatic Eyes: conj clear, EOMi, PEERLA Ears: normal TM's and external ear canals both ears Nose: Nares normal. Septum midline. Mucosa normal. No drainage or sinus tenderness. Throat: lips, mucosa, and tongue normal; teeth and gums normal Neck: no adenopathy, no carotid bruit, no JVD, supple, symmetrical, trachea midline and thyroid not enlarged, symmetric, no tenderness/mass/nodules Back: symmetric, no curvature. ROM normal. No CVA tenderness. Lungs: clear to auscultation bilaterally Chest wall: no tenderness Heart: regular rate and rhythm, S1, S2 normal, no murmur, click, rub or gallop Abdomen: soft, non-tender; bowel sounds normal; no masses,  no organomegaly Extremities: extremities normal, atraumatic, no cyanosis or edema Pulses: 2+ and symmetric Skin: Skin color, texture, turgor normal. No rashes or lesions Lymph nodes: Cervical, supraclavicular, and axillary nodes normal. Neurologic: Alert and oriented X 3, normal strength and tone. Normal symmetric reflexes. Normal coordination and gait     Assessment:     Medicare Wellness Exam       Plan:     During the course of the visit the patient was educated and counseled about appropriate screening and preventive services including:    He is up to date on preventive care  Diet review for nutrition referral? Yes ____  Not Indicated __X__   Patient Instructions (the written plan) was given to the patient.  Medicare Attestation I have  personally reviewed: The patient's medical and social history Their use of alcohol, tobacco or illicit drugs Their current medications and supplements The patient's functional ability including ADLs,fall risks, home safety risks, cognitive, and hearing and visual impairment Diet and physical activities Evidence for depression or mood disorders  The patient's weight, height, BMI, and visual acuity have been recorded in the chart.  I have made referrals, counseling, and provided education to the patient based on review of the above and I have provided the patient with a written personalized care plan for preventive services.     Willies Laviolette, MD   04/12/2015

## 2015-04-12 NOTE — Patient Instructions (Signed)
Keep up a regular exercise program and make sure you are eating a healthy diet Try to eat 4 servings of dairy a day, or if you are lactose intolerant take a calcium with vitamin D daily.  Your vaccines are up to date.   

## 2015-04-22 ENCOUNTER — Other Ambulatory Visit: Payer: Self-pay | Admitting: Family Medicine

## 2015-04-28 ENCOUNTER — Encounter: Payer: Self-pay | Admitting: Emergency Medicine

## 2015-04-28 ENCOUNTER — Emergency Department (INDEPENDENT_AMBULATORY_CARE_PROVIDER_SITE_OTHER)
Admission: EM | Admit: 2015-04-28 | Discharge: 2015-04-28 | Disposition: A | Discharge status: Home / Self Care | Payer: Medicare Other | Source: Home / Self Care | Attending: Family Medicine | Admitting: Family Medicine

## 2015-04-28 DIAGNOSIS — B349 Viral infection, unspecified: Secondary | ICD-10-CM

## 2015-04-28 MED ORDER — BUDESONIDE-FORMOTEROL FUMARATE 80-4.5 MCG/ACT IN AERO: 2.0000 | INHALATION_SPRAY | Freq: Every day | RESPIRATORY_TRACT | Status: DC

## 2015-04-28 NOTE — Discharge Instructions (Signed)
If increasing cold symptoms develop, try the following: Take plain guaifenesin (1200mg  extended release tabs such as Mucinex) twice daily, with plenty of water, for cough and congestion.  Get adequate rest.   May use Afrin nasal spray (or generic oxymetazoline) twice daily for about 5 days and then discontinue.  Also recommend using saline nasal spray several times daily and saline nasal irrigation (AYR is a common brand).  Try warm salt water gargles for sore throat.  Check temperature daily.   May take Tylenol for headache as needed. Stop all antihistamines for now, and other non-prescription cough/cold preparations.

## 2015-04-28 NOTE — ED Notes (Signed)
Pt c/o sweating and nervousness feeling that started today @ 4:30pm.  Pt is complaining of some headache and just not feeling well.

## 2015-04-28 NOTE — ED Provider Notes (Signed)
CSN: [id number]     Arrival date & time 04/28/15  1743 History   First MD Initiated Contact with Patient 04/28/15 1751     Chief Complaint  Patient presents with  . Headache      HPI Comments: Today at 4:30pm patient developed mild headache, sweats, nausea without vomiting, and myalgias.  He has had increased sinus congestion.  No sore throat or cough.  No chest pain. He states that one month ago he had a URI, treated with a Z-pak.  Six days later he had persistent symptoms and was prescribed a 12 day course of prednisone after which he felt better. He has been prescribed Symbicort in the past, but is presently not using.  The history is provided by the patient and the spouse.    Past Medical History  Diagnosis Date  . CAD (coronary artery disease)     a. PTCA 2001. b. f/u cath 2008: mild nonobstructive CAD.  Marland Kitchen Hypertension   . Acute respiratory failure ([place]) 09/25/11    hypoxic  . Peripheral vascular disease ([place])     patient denies knowledge of, but listed in his chart  . High cholesterol   . Anxiety   . Pleural effusion     Parapneumonic ?r/t PNA s/p thoracentesis 2008  . GERD (gastroesophageal reflux disease)   . Colon polyps   . COPD (chronic obstructive pulmonary disease) ([place])   . Pneumonia     hx  . Sleep apnea     cpap    Past Surgical History  Procedure Laterality Date  . Hemorrhoid surgery  1960's  . Coronary angioplasty  01/2000  . Cataract extraction w/ intraocular lens  implant, bilateral  ~ 2006  . Cholecystectomy N/A 10/11/2012    Procedure: LAPAROSCOPIC CHOLECYSTECTOMY WITH INTRAOPERATIVE CHOLANGIOGRAM;  Surgeon: Adin Hector, MD;  Location: [facility];  Service: General;  Laterality: N/A;   Family History  Problem Relation Age of Onset  . Stroke Mother   . Arthritis Mother   . Asthma Mother   . Emphysema Mother   . Arthritis Sister   . Stroke Sister   . Stroke Father    Social History  Substance Use Topics  . Smoking status: Former Smoker -- 1.00  packs/day for 10 years    Types: Cigarettes    Quit date: 06/01/1982  . Smokeless tobacco: Never Used     Comment: "quit smoking ~ 25 years ago; smoked ~ 1 pk/wk"  . Alcohol Use: No    Review of Systems No sore throat No cough No pleuritic pain No wheezing + nasal congestion ? post-nasal drainage No sinus pain/pressure No itchy/red eyes No earache No hemoptysis + SOB ? fever, + chills/sweats + nausea No vomiting No abdominal pain No diarrhea No urinary symptoms No skin rash + fatigue + myalgias + headache Used OTC meds without relief  Allergies  Review of patient's allergies indicates no known allergies.  Home Medications   Prior to Admission medications   Medication Sig Start Date End Date Taking? Authorizing Provider  Ascorbic Acid (VITAMIN C PO) Take 1 tablet by mouth daily.    Historical Provider, MD  aspirin EC 81 MG tablet Take 81 mg by mouth daily.      Historical Provider, MD  budesonide-formoterol (SYMBICORT) 80-4.5 MCG/ACT inhaler Inhale 2 puffs into the lungs at bedtime. 04/28/15   Kandra Nicolas, MD  FLUoxetine (PROZAC) 20 MG capsule TK 1 C PO D 02/11/15   Historical Provider, MD  FLUoxetine (PROZAC)  20 MG capsule TAKE 1 CAPSULE BY MOUTH DAILY 04/22/15   [name], MD  furosemide (LASIX) 40 MG tablet Take 1 tablet by mouth  every other day Usually on  Monday,Wednesday,Friday 12/25/14   [name], MD  HYDROcodone-homatropine [facility]) 5-1.5 MG/5ML syrup Take 5 mLs by mouth at bedtime as needed for cough. 04/12/15   [name], MD  imiquimod [place]) 5 % cream Apply topically at bedtime. 01/09/15   [name], MD  levothyroxine (SYNTHROID, LEVOTHROID) 112 MCG tablet TAKE 1 TABLET(112 MCG) BY MOUTH DAILY BEFORE BREAKFAST 04/12/15   [name], MD  Multiple Vitamins-Minerals (ICAPS MV PO) Take 1 capsule by mouth daily.    Historical Provider, MD  NITROSTAT 0.4 MG SL tablet Dissolve 1 tablet under the tongue  every 5 minutes as  needed chest pain 05/07/14   [name], MD  simvastatin (ZOCOR) 80 MG tablet Take 1 tablet by mouth  every evening 01/30/15   [name], MD  traZODone (DESYREL) 50 MG tablet Take 1 tablet by mouth at  bedtime 01/30/15   [name], MD  triamcinolone cream (KENALOG) 0.1 % Apply to affected area(s)  Daily PRN for up to 2  weeks. Avoid face. 04/12/15   [name], MD   Meds Ordered and Administered this Visit  Medications - No data to display  BP 143/76 mmHg  Pulse 73  Temp(Src) 97.9 F (36.6 C) (Oral)  Ht 5\' 11"  (1.803 m)  Wt 257 lb (116.574 kg)  BMI 35.86 kg/m2  SpO2 96% No data found.   Physical Exam Nursing notes and Vital Signs reviewed. Appearance:  Patient appears stated age, and in no acute distress Eyes:  Pupils are equal, round, and reactive to light and accomodation.  Extraocular movement is intact.  Conjunctivae are not inflamed  Ears:  Canals normal.  Tympanic membranes normal.  Nose:  Mildly congested turbinates.  No sinus tenderness.     Pharynx:  Normal Neck:  Supple.  Slightly tender enlarged posterior nodes are palpated bilaterally  Lungs:  Clear to auscultation.  Breath sounds are equal.  Moving air well. Heart:  Regular rate and rhythm without murmurs, rubs, or gallops.  Abdomen:  Nontender without masses or hepatosplenomegaly.  Bowel sounds are present.  No CVA or flank tenderness.  Extremities:  No edema.  No calf tenderness Skin:  No rash present.   ED Course  Procedures  none  MDM   1. Viral syndrome    Suspect early viral prodromal symptoms. Resume Symbicort at bedtime. If increasing cold symptoms develop, try the following: Take plain guaifenesin (1200mg  extended release tabs such as Mucinex) twice daily, with plenty of water, for cough and congestion.  Get adequate rest.   May use Afrin nasal spray (or generic oxymetazoline) twice daily for about 5 days and then discontinue.  Also recommend  using saline nasal spray several times daily and saline nasal irrigation (AYR is a common brand).  Try warm salt water gargles for sore throat.  Check temperature daily.   May take Tylenol for headache as needed. Stop all antihistamines for now, and other non-prescription cough/cold preparations. Followup with Family Doctor in about two days.    Kandra Nicolas, MD 05/02/15 [phone]

## 2015-05-03 ENCOUNTER — Encounter: Payer: Self-pay | Admitting: Family Medicine

## 2015-05-03 ENCOUNTER — Ambulatory Visit (INDEPENDENT_AMBULATORY_CARE_PROVIDER_SITE_OTHER): Payer: Medicare Other | Admitting: Family Medicine

## 2015-05-03 VITALS — BP 125/59 | HR 68 | Temp 97.7°F | Resp 18 | Wt 253.2 lb

## 2015-05-03 DIAGNOSIS — G4700 Insomnia, unspecified: Secondary | ICD-10-CM

## 2015-05-03 DIAGNOSIS — J019 Acute sinusitis, unspecified: Secondary | ICD-10-CM | POA: Diagnosis not present

## 2015-05-03 MED ORDER — ESCITALOPRAM OXALATE 10 MG PO TABS
10.0000 mg | ORAL_TABLET | Freq: Every day | ORAL | Status: DC
Start: 2015-05-03 — End: 2015-06-03

## 2015-05-03 MED ORDER — AMOXICILLIN-POT CLAVULANATE 875-125 MG PO TABS: 1.0000 | ORAL_TABLET | Freq: Two times a day (BID) | ORAL | Status: DC

## 2015-05-03 MED ORDER — FLUOXETINE HCL (PMDD) 10 MG PO CAPS: 1.0000 | ORAL_CAPSULE | Freq: Every day | ORAL | Status: DC

## 2015-05-03 NOTE — Progress Notes (Signed)
Subjective:    Patient ID: Bryan Hatfield, male    DOB: 1926-12-12, 81 y.o.   MRN: [id number]  HPI 6 days ago started feeling suddenly sweaty and nauseted.  They felt it was viral  Syndrome.  He still fels tired and weak.  Still lots of nasal congestion. Feels it is worse the last couple of days.  He still has a cough. Is not worse at night. No fevers chills or sweats now  Insomnia - says the trazodone isn't working. spoke the a family member he takes Lexapro and says it works great for his mood and his sleep and he says he would like to try that so the fluoxetine.   Review of Systems  BP 125/59 mmHg  Pulse 68  Temp(Src) 97.7 F (36.5 C)  Resp 18  Wt 253 lb 3.2 oz (114.851 kg)  SpO2 99%    No Known Allergies  Past Medical History  Diagnosis Date  . CAD (coronary artery disease)     a. PTCA 2001. b. f/u cath 2008: mild nonobstructive CAD.  Marland Kitchen Hypertension   . Acute respiratory failure ([place]) 09/25/11    hypoxic  . Peripheral vascular disease ([place])     patient denies knowledge of, but listed in his chart  . High cholesterol   . Anxiety   . Pleural effusion     Parapneumonic ?r/t PNA s/p thoracentesis 2008  . GERD (gastroesophageal reflux disease)   . Colon polyps   . COPD (chronic obstructive pulmonary disease) ([place])   . Pneumonia     hx  . Sleep apnea     cpap     Past Surgical History  Procedure Laterality Date  . Hemorrhoid surgery  1960's  . Coronary angioplasty  01/2000  . Cataract extraction w/ intraocular lens  implant, bilateral  ~ 2006  . Cholecystectomy N/A 10/11/2012    Procedure: LAPAROSCOPIC CHOLECYSTECTOMY WITH INTRAOPERATIVE CHOLANGIOGRAM;  Surgeon: Adin Hector, MD;  Location: [facility];  Service: General;  Laterality: N/A;    Social History   Social History  . Marital Status: Married    Spouse Name: N/A  . Number of Children: N/A  . Years of Education: N/A   Occupational History  . Retired     Surveyor, quantity - Civil engineer, [employer] and [employer]   Social History Main  Topics  . Smoking status: Former Smoker -- 1.00 packs/day for 10 years    Types: Cigarettes    Quit date: 06/01/1982  . Smokeless tobacco: Never Used     Comment: "quit smoking ~ 25 years ago; smoked ~ 1 pk/wk"  . Alcohol Use: No  . Drug Use: No  . Sexual Activity: No   Other Topics Concern  . Not on file   Social History Narrative    Family History  Problem Relation Age of Onset  . Stroke Mother   . Arthritis Mother   . Asthma Mother   . Emphysema Mother   . Arthritis Sister   . Stroke Sister   . Stroke Father     Outpatient Encounter Prescriptions as of 05/03/2015  Medication Sig  . Ascorbic Acid (VITAMIN C PO) Take 1 tablet by mouth daily.  Marland Kitchen aspirin EC 81 MG tablet Take 81 mg by mouth daily.    . budesonide-formoterol (SYMBICORT) 80-4.5 MCG/ACT inhaler Inhale 2 puffs into the lungs at bedtime.  . furosemide (LASIX) 40 MG tablet Take 1 tablet by mouth  every other day Usually on  Monday,Wednesday,Friday  . HYDROcodone-homatropine (HYCODAN) 5-1.5  MG/5ML syrup Take 5 mLs by mouth at bedtime as needed for cough.  . imiquimod (ALDARA) 5 % cream Apply topically at bedtime.  Marland Kitchen levothyroxine (SYNTHROID, LEVOTHROID) 112 MCG tablet TAKE 1 TABLET(112 MCG) BY MOUTH DAILY BEFORE BREAKFAST  . Multiple Vitamins-Minerals (ICAPS MV PO) Take 1 capsule by mouth daily.  Marland Kitchen NITROSTAT 0.4 MG SL tablet Dissolve 1 tablet under the tongue every 5 minutes as  needed chest pain  . simvastatin (ZOCOR) 80 MG tablet Take 1 tablet by mouth  every evening  . traZODone (DESYREL) 50 MG tablet Take 1 tablet by mouth at  bedtime  . triamcinolone cream (KENALOG) 0.1 % Apply to affected area(s)  Daily PRN for up to 2  weeks. Avoid face.  . [DISCONTINUED] FLUoxetine (PROZAC) 20 MG capsule TAKE 1 CAPSULE BY MOUTH DAILY  . amoxicillin-clavulanate (AUGMENTIN) 875-125 MG tablet Take 1 tablet by mouth 2 (two) times daily.  Marland Kitchen escitalopram (LEXAPRO) 10 MG tablet Take 1 tablet (10 mg total) by mouth daily.  .  Fluoxetine HCl, PMDD, 10 MG CAPS Take 1 capsule (10 mg total) by mouth daily.  . [DISCONTINUED] FLUoxetine (PROZAC) 20 MG capsule TK 1 C PO D   No facility-administered encounter medications on file as of 05/03/2015.          Objective:   Physical Exam  Constitutional: He is oriented to person, place, and time. He appears well-developed and well-nourished.  HENT:  Head: Normocephalic and atraumatic.  Right Ear: External ear normal.  Left Ear: External ear normal.  Nose: Nose normal.  Mouth/Throat: Oropharynx is clear and moist.  TMs and canals are clear.   Eyes: Conjunctivae and EOM are normal. Pupils are equal, round, and reactive to light.  Neck: Neck supple. No thyromegaly present.  Cardiovascular: Normal rate and normal heart sounds.   Pulmonary/Chest: Effort normal and breath sounds normal.  Lymphadenopathy:    He has no cervical adenopathy.  Neurological: He is alert and oriented to person, place, and time.  Skin: Skin is warm and dry.  Psychiatric: He has a normal mood and affect.          Assessment & Plan:  Acute sinusitis-symptoms 6 days and he feels like he is getting worse. We'll go ahead and treat with Augmentin. Call if not improving over the next 5-10 days. Okay for symptom Medicare. Next  Insomnia-we'll discontinue trazodone. I'll be happy to switch him from fluoxetine over to Lexapro. New prescription sent to the pharmacy.

## 2015-05-03 NOTE — Patient Instructions (Addendum)
Decrease Fluxoetine to 10mg  for 5 days, then stop. Ok to start the escitalopram then.  Take one a day. Decrease the trazodone to 2 tabs at bedtime for 2 nights and then 1 tab at bedtime for 2 nights and then stop.

## 2015-05-07 ENCOUNTER — Telehealth: Payer: Self-pay | Admitting: *Deleted

## 2015-05-07 ENCOUNTER — Other Ambulatory Visit: Payer: Self-pay | Admitting: *Deleted

## 2015-05-07 MED ORDER — BUDESONIDE-FORMOTEROL FUMARATE 80-4.5 MCG/ACT IN AERO: 2.0000 | INHALATION_SPRAY | Freq: Every day | RESPIRATORY_TRACT | Status: DC

## 2015-05-07 NOTE — Telephone Encounter (Signed)
Called pt and informed him that his forms have been completed and are up front for him to p/u.Bryan Hatfield [place]

## 2015-05-21 ENCOUNTER — Ambulatory Visit: Payer: Medicare Other | Admitting: Family Medicine

## 2015-05-29 ENCOUNTER — Encounter: Payer: Self-pay | Admitting: Internal Medicine

## 2015-05-29 ENCOUNTER — Ambulatory Visit (INDEPENDENT_AMBULATORY_CARE_PROVIDER_SITE_OTHER): Payer: Medicare Other | Admitting: Internal Medicine

## 2015-05-29 ENCOUNTER — Other Ambulatory Visit (INDEPENDENT_AMBULATORY_CARE_PROVIDER_SITE_OTHER): Payer: Medicare Other

## 2015-05-29 VITALS — BP 122/66 | HR 61 | Ht 71.0 in | Wt 257.0 lb

## 2015-05-29 DIAGNOSIS — R0600 Dyspnea, unspecified: Secondary | ICD-10-CM | POA: Diagnosis not present

## 2015-05-29 DIAGNOSIS — J449 Chronic obstructive pulmonary disease, unspecified: Secondary | ICD-10-CM

## 2015-05-29 LAB — SEDIMENTATION RATE: SED RATE: 33 mm/h — AB (ref 0–22)

## 2015-05-29 LAB — BASIC METABOLIC PANEL
BUN: 22 mg/dL (ref 6–23)
CHLORIDE: 102 meq/L (ref 96–112)
CO2: 29 mEq/L (ref 19–32)
CREATININE: 0.86 mg/dL (ref 0.40–1.50)
Calcium: 9 mg/dL (ref 8.4–10.5)
GFR: 89.45 mL/min (ref >60.00)
Glucose, Bld: 95 mg/dL (ref 70–99)
Potassium: 4.1 mEq/L (ref 3.5–5.1)
Sodium: 139 mEq/L (ref 135–145)

## 2015-05-29 LAB — CBC WITH DIFFERENTIAL/PLATELET
BASOS ABS: 0 10*3/uL (ref 0.0–0.1)
Basophils Relative: 0.3 % (ref 0.0–3.0)
EOS ABS: 0.2 10*3/uL (ref 0.0–0.7)
Eosinophils Relative: 3.3 % (ref 0.0–5.0)
HEMATOCRIT: 41.3 % (ref 39.0–52.0)
Hemoglobin: 13.6 g/dL (ref 13.0–17.0)
LYMPHS PCT: 23.8 % (ref 12.0–46.0)
Lymphs Abs: 1.7 10*3/uL (ref 0.7–4.0)
MCHC: 33.1 g/dL (ref 30.0–36.0)
MCV: 92.5 fl (ref 78.0–100.0)
MONOS PCT: 6.4 % (ref 3.0–12.0)
Monocytes Absolute: 0.5 10*3/uL (ref 0.1–1.0)
NEUTROS PCT: 66.2 % (ref 43.0–77.0)
Neutro Abs: 4.9 10*3/uL (ref 1.4–7.7)
PLATELETS: 184 10*3/uL (ref 150.0–400.0)
RBC: 4.46 Mil/uL (ref 4.22–5.81)
RDW: 13.9 % (ref 11.5–15.5)
WBC: 7.3 10*3/uL (ref 4.0–10.5)

## 2015-05-29 LAB — BRAIN NATRIURETIC PEPTIDE: PRO B NATRI PEPTIDE: 133 pg/mL — AB (ref 0.0–100.0)

## 2015-05-29 LAB — TSH: TSH: 0.96 u[IU]/mL (ref 0.35–4.50)

## 2015-05-29 NOTE — Progress Notes (Signed)
Subjective:    Patient ID: Bryan Hatfield, male    DOB: Nov 17, 1926,     MRN: [id number]   Brief patient profile:   67 yowm quit smoking in 1984 with very mild copd previously followed by Dr Joya Gaskins   History of Present Illness  12/21/2013 Dr Bettina Gavia final note  Chief Complaint  Patient presents with  . Yearly Follow up    appt requested by Dr. Madilyn Fireman.  Feels breathing is at baseline.  Has DOE and a nonprod cough.  No wheezing, or chest tightness/pain.  Recent tracheobronchtiis and rx with pred nad is better Using symbicort now.  Now real wheeze.  rec Prevnar 13 was given Stay on symbicort two puff twice daily    05/29/2015  Extended  f/u ov/Wert re: transition of care  Chief Complaint  Patient presents with  . Follow-up    Former PW pt. Pt c/o cough and SOB for the past 6 wks. Cough is non prod at this time. He states that he feels tired and has no energy.   having trouble affording the symbicort until  4 weeks ago and only a little better ex to since restarted  Starting in Oct 2016 cough / sob > [facility]   > augmentin corrected everything  But the sob x 55ft which is different and was working out at [employer] previously  No problem at rest or sleeping  Some hb using alternative meds prn    No obvious day to day or daytime variability or assoc  excess/ purulent sputum or mucus plugs   or cp or chest tightness, subjective wheeze or overt sinus or hb symptoms. No unusual exp hx or h/o childhood pna/ asthma or knowledge of premature birth.  Sleeping ok without nocturnal  or early am exacerbation  of respiratory  c/o's or need for noct saba. Also denies any obvious fluctuation of symptoms with weather or environmental changes or other aggravating or alleviating factors except as outlined above   Current Medications, Allergies, Complete Past Medical History, Past Surgical History, Family History, and Social History were reviewed in [facility] record.  ROS  The  following are not active complaints unless bolded sore throat, dysphagia, dental problems, itching, sneezing,  nasal congestion or excess/ purulent secretions, ear ache,   fever, chills, sweats, unintended wt loss, classically pleuritic or exertional cp, hemoptysis,  orthopnea pnd or leg swelling, presyncope, palpitations, abdominal pain, anorexia, nausea, vomiting, diarrhea  or change in bowel or bladder habits, change in stools or urine, dysuria,hematuria,  rash, arthralgias, visual complaints, headache, numbness, weakness or ataxia or problems with walking or coordination,  change in mood/affect or memory.               Objective:   Physical Exam    amb hoarse obese wm nad    Wt Readings from Last 3 Encounters:  05/29/15 257 lb (116.574 kg)  05/03/15 253 lb 3.2 oz (114.851 kg)  04/28/15 257 lb (116.574 kg)    Vital signs reviewed      Gen: Pleasant, obese, in no distress,  normal affect  ENT: No lesions,  mouth clear,  oropharynx clear, no postnasal drip - endentulous   Neck: No JVD, no TMG, no carotid bruits  Lungs: No use of accessory muscles, no dullness to percussion, clear breath sounds without wheezes  Cardiovascular: RRR, heart sounds normal, no murmur or gallops, no peripheral edema  Abdomen: soft and NT, no HSM,  BS normal  Musculoskeletal: No  deformities, no cyanosis or clubbing  Neuro: alert, non focal  Skin: Warm, no lesions or rashes    I personally reviewed images and agree with radiology impression as follows:  CXR: 03/27/2015 No active cardiopulmonary disease.   Labs ordered/ reviewed:    Lab 05/29/15 1207  NA 139  K 4.1  CL 102  CO2 29  BUN 22  CREATININE 0.86  GLUCOSE 95     Lab 05/29/15 1207  HGB 13.6  HCT 41.3  WBC 7.3  PLT 184.0     Lab Results  Component Value Date   TSH 0.96 05/29/2015     Lab Results  Component Value Date   PROBNP 133.0* 05/29/2015       Lab Results  Component Value Date   ESRSEDRATE 33*  05/29/2015          Assessment & Plan:   Outpatient Encounter Prescriptions as of 05/29/2015  Medication Sig  . Ascorbic Acid (VITAMIN C PO) Take 1 tablet by mouth daily.  Marland Kitchen aspirin EC 81 MG tablet Take 81 mg by mouth daily.    . budesonide-formoterol (SYMBICORT) 80-4.5 MCG/ACT inhaler Inhale 2 puffs into the lungs at bedtime. FAX:[phone]  . escitalopram (LEXAPRO) 10 MG tablet Take 1 tablet (10 mg total) by mouth daily.  . furosemide (LASIX) 40 MG tablet Take 1 tablet by mouth  every other day Usually on  Monday,Wednesday,Friday  . imiquimod (ALDARA) 5 % cream Apply topically at bedtime.  Marland Kitchen levothyroxine (SYNTHROID, LEVOTHROID) 112 MCG tablet TAKE 1 TABLET(112 MCG) BY MOUTH DAILY BEFORE BREAKFAST  . Multiple Vitamins-Minerals (ICAPS MV PO) Take 1 capsule by mouth daily.  Marland Kitchen NITROSTAT 0.4 MG SL tablet Dissolve 1 tablet under the tongue every 5 minutes as  needed chest pain  . simvastatin (ZOCOR) 80 MG tablet Take 1 tablet by mouth  every evening  . traZODone (DESYREL) 50 MG tablet Take 1 tablet by mouth at  bedtime  . triamcinolone cream (KENALOG) 0.1 % Apply to affected area(s)  Daily PRN for up to 2  weeks. Avoid face.  . [DISCONTINUED] amoxicillin-clavulanate (AUGMENTIN) 875-125 MG tablet Take 1 tablet by mouth 2 (two) times daily.  . [DISCONTINUED] Fluoxetine HCl, PMDD, 10 MG CAPS Take 1 capsule (10 mg total) by mouth daily.  . [DISCONTINUED] HYDROcodone-homatropine (HYCODAN) 5-1.5 MG/5ML syrup Take 5 mLs by mouth at bedtime as needed for cough.   No facility-administered encounter medications on file as of 05/29/2015.

## 2015-05-29 NOTE — Patient Instructions (Addendum)
Continue  symbicort to 160 Take 2 puffs first thing in am and then another 2 puffs about 12 hours later.   Please remember to go to the lab department downstairs for your tests - we will call you with the results when they are available.  Work on inhaler technique:  relax and gently blow all the way out then take a nice smooth deep breath back in, triggering the inhaler at same time you start breathing in.  Hold for up to 5 seconds if you can. Blow out thru nose. Rinse and gargle with water when done  GERD (REFLUX)  is an extremely common cause of respiratory symptoms just like yours , many times with no obvious heartburn at all.    It can be treated with medication, but also with lifestyle changes including elevation of the head of your bed (ideally with 6 inch  bed blocks),  Smoking cessation, avoidance of late meals, excessive alcohol, and avoid fatty foods, chocolate, peppermint, colas, red wine, and acidic juices such as orange juice.  NO MINT OR MENTHOL PRODUCTS SO NO COUGH DROPS  USE SUGARLESS CANDY INSTEAD (Jolley ranchers or Stover's or Life Savers) or even ice chips will also do - the key is to swallow to prevent all throat clearing. NO OIL BASED VITAMINS - use powdered substitutes  If not improving rec prilosec 20 mg Take 30-60 min before first meal of the day   [facility] to refer you to Dr Gwenette Greet in the pulmonary clinic there- no need for follow up here

## 2015-05-29 NOTE — Assessment & Plan Note (Addendum)
05/29/2015  Walked RA x 3 laps @ 185 ft each stopped due to sob but no desats     When respiratory symptoms begin or become refractory well after a patient reports complete smoking cessation,  Especially when this wasn't the case while they were smoking, a red flag is raised based on the work of Dr Kris Mouton which states:  if you quit smoking when your best day FEV1 is still well preserved it is highly unlikely you will progress to severe disease.  That is to say, once the smoking stops,  the symptoms should not suddenly erupt or markedly worsen.  If so, the differential diagnosis should include  obesity/deconditioning,  LPR/Reflux/Aspiration syndromes,  occult CHF, or  especially side effect of medications commonly used in this population.     He does not have sign copd but needs f/u for obesity/ LPR > per primary care.  If can't afford his meds can see Dr Gwenette Greet at the [facility] for f/u AB  No need for f/u here.

## 2015-05-30 ENCOUNTER — Encounter: Payer: Self-pay | Admitting: Internal Medicine

## 2015-05-30 NOTE — Assessment & Plan Note (Signed)
Complicated by hyperlipidemia   Body mass index is 35.86    Lab Results  Component Value Date   TSH 0.96 05/29/2015     Contributing to gerd tendency/ doe/reviewed the need and the process to achieve and maintain neg calorie balance > defer f/u primary care including intermittently monitoring thyroid status

## 2015-05-30 NOTE — Assessment & Plan Note (Addendum)
Quit smoking 1984 05/19/2012: Spirometry reveals FEV1 80% predicted FVC 92% predicted FEV1 FVC ratio 64% predicted FEF 25 7541% predicted compatible with very mild peripheral airflow obstruction - Spirometry 05/29/2015   FEV1 2.57 (81%) ratio 75 p am symbicort though FV loop truncated in the effort dep portion      So he really does not have sign copd but have a small AB component and the lower strength of symbicort is fine here   - The proper method of use, as well as anticipated side effects, of a metered-dose inhaler are discussed and demonstrated to the patient. Improved effectiveness after extensive coaching during this visit to a level of approximately 75% % from a baseline of < 50 %   I had an extended discussion with the patient reviewing all relevant studies completed to date and  lasting 25 minutes of a 40 minute transition of care office visit    Each maintenance medication was reviewed in detail including most importantly the difference between maintenance and prns and under what circumstances the prns are to be triggered using an action plan format that is not reflected in the computer generated alphabetically organized AVS.    Please see instructions for details which were reviewed in writing and the patient given a copy highlighting the part that I personally wrote and discussed at today's ov.   rec

## 2015-05-31 ENCOUNTER — Ambulatory Visit (INDEPENDENT_AMBULATORY_CARE_PROVIDER_SITE_OTHER): Payer: Medicare Other | Admitting: Family Medicine

## 2015-05-31 ENCOUNTER — Encounter: Payer: Self-pay | Admitting: Family Medicine

## 2015-05-31 VITALS — BP 129/50 | HR 97 | Wt 261.0 lb

## 2015-05-31 DIAGNOSIS — F329 Major depressive disorder, single episode, unspecified: Secondary | ICD-10-CM

## 2015-05-31 DIAGNOSIS — F32A Depression, unspecified: Secondary | ICD-10-CM

## 2015-05-31 DIAGNOSIS — G4700 Insomnia, unspecified: Secondary | ICD-10-CM | POA: Diagnosis not present

## 2015-05-31 MED ORDER — ZOLPIDEM TARTRATE 5 MG PO TABS: 5.0000 mg | ORAL_TABLET | Freq: Every evening | ORAL | Status: DC | PRN

## 2015-05-31 NOTE — Progress Notes (Signed)
   Subjective:    Patient ID: SULEIMAN VACANTI, male    DOB: 13-Jun-1926, 81 y.o.   MRN: [id number]  HPI Insomnia - one of his biggest concerns is that he is not sleeping well. The trazodone was not effective.  He would like to try Ambien which his wife takes. He said he took years ago and it was effective.    Depression -  he overall feels like he is doing better. His wife is here with him today. She feels that she's noticed a big difference in his mood and feels like it's helpful. He denies any side effects. He still reports feeling down and sad several days of the week.   Review of Systems     Objective:   Physical Exam  Constitutional: He is oriented to person, place, and time. He appears well-developed and well-nourished.  HENT:  Head: Normocephalic and atraumatic.  Cardiovascular: Normal rate, regular rhythm and normal heart sounds.   Pulmonary/Chest: Effort normal and breath sounds normal.  Neurological: He is alert and oriented to person, place, and time.  Skin: Skin is warm and dry.  Psychiatric: He has a normal mood and affect. His behavior is normal.          Assessment & Plan:  Insomnia-we'll switch to low-dose Ambien 5 mg. Warned about sedation and to stop immediately if he has any side effects such as sleepwalking. Follow back up in 2 months to make sure he is doing well to medication.  Depression-some improvement. PHQ 9 score is 11 today. Is still not quite where I would like to have him. I really think you benefit from therapy or counseling. Follow back up in 2 months.

## 2015-06-03 ENCOUNTER — Other Ambulatory Visit: Payer: Self-pay | Admitting: Family Medicine

## 2015-06-26 ENCOUNTER — Other Ambulatory Visit: Payer: Self-pay | Admitting: Family Medicine

## 2015-06-26 MED ORDER — LEVOTHYROXINE SODIUM 112 MCG PO TABS: ORAL_TABLET | ORAL | Status: DC

## 2015-06-26 MED ORDER — ESCITALOPRAM OXALATE 10 MG PO TABS: 10.0000 mg | ORAL_TABLET | Freq: Every day | ORAL | Status: DC

## 2015-07-20 ENCOUNTER — Other Ambulatory Visit: Payer: Self-pay | Admitting: Family Medicine

## 2015-07-22 ENCOUNTER — Other Ambulatory Visit: Payer: Self-pay | Admitting: Family Medicine

## 2015-07-30 ENCOUNTER — Ambulatory Visit (INDEPENDENT_AMBULATORY_CARE_PROVIDER_SITE_OTHER): Payer: Medicare Other | Admitting: Family Medicine

## 2015-07-30 ENCOUNTER — Encounter: Payer: Self-pay | Admitting: Family Medicine

## 2015-07-30 VITALS — BP 134/61 | HR 77 | Wt 256.0 lb

## 2015-07-30 DIAGNOSIS — G4700 Insomnia, unspecified: Secondary | ICD-10-CM | POA: Diagnosis not present

## 2015-07-30 DIAGNOSIS — F329 Major depressive disorder, single episode, unspecified: Secondary | ICD-10-CM

## 2015-07-30 DIAGNOSIS — E038 Other specified hypothyroidism: Secondary | ICD-10-CM | POA: Diagnosis not present

## 2015-07-30 DIAGNOSIS — E039 Hypothyroidism, unspecified: Secondary | ICD-10-CM

## 2015-07-30 DIAGNOSIS — F32A Depression, unspecified: Secondary | ICD-10-CM

## 2015-07-30 MED ORDER — ATORVASTATIN CALCIUM 40 MG PO TABS: 40.0000 mg | ORAL_TABLET | Freq: Every day | ORAL | Status: DC

## 2015-07-30 MED ORDER — ZOLPIDEM TARTRATE 5 MG PO TABS: ORAL_TABLET | ORAL | Status: DC

## 2015-07-30 NOTE — Patient Instructions (Signed)
Due for labs CMP, Lipid, TSH in April

## 2015-07-30 NOTE — Progress Notes (Signed)
   Subjective:    Patient ID: Bryan Hatfield, male    DOB: [birth year], 82 y.o.   MRN: [id number]  HPI 2 mo f/u for mood. He is overall doing well and feels like his symptoms are really well controlled. He really feels like getting his sleep regulated has also made a big difference in his mood. He is currently taking Lexapro 10 mg daily.  F/U insomnia - he failed trazodone and we switched to [place] when I saw him last time 2 mo ago.  He is not sleepwalking or having any sedation. Goes to bed around 10:30 and wakes up around 4:30 so getting about 6 hours.    Hypothyroid--doing well. Taking medication regularly. He feels like his symptoms are regulated.  Lab Results  Component Value Date   TSH 0.96 05/29/2015     Review of Systems     Objective:   Physical Exam  Constitutional: He is oriented to person, place, and time. He appears well-developed and well-nourished.  HENT:  Head: Normocephalic and atraumatic.  Cardiovascular: Normal rate, regular rhythm and normal heart sounds.   Pulmonary/Chest: Effort normal and breath sounds normal.  Neurological: He is alert and oriented to person, place, and time.  Skin: Skin is warm and dry.  Psychiatric: He has a normal mood and affect. His behavior is normal.          Assessment & Plan:  Depression, acute - in remision. PHQ- 9 score of 1 for low energy.  Rates sxs as not difficult.  Continue current regimen. Follow-up in 6 months. Next  Insomnia-is tolerating the Ambien well without any excess sedation or side effects. Encouraged him to continue to monitor this carefully. Follow-up in 6 months.  Hypothyroidism-he is due to recheck his thyroid in April so encouraged him to call around that time to have his level rechecked.

## 2015-08-30 ENCOUNTER — Other Ambulatory Visit: Payer: Self-pay | Admitting: Family Medicine

## 2015-09-02 ENCOUNTER — Other Ambulatory Visit: Payer: Self-pay | Admitting: *Deleted

## 2015-09-05 ENCOUNTER — Other Ambulatory Visit: Payer: Self-pay | Admitting: *Deleted

## 2015-09-05 MED ORDER — ESCITALOPRAM OXALATE 10 MG PO TABS: 10.0000 mg | ORAL_TABLET | Freq: Every day | ORAL | Status: DC

## 2015-09-06 ENCOUNTER — Other Ambulatory Visit: Payer: Self-pay | Admitting: Family Medicine

## 2015-09-06 DIAGNOSIS — E038 Other specified hypothyroidism: Secondary | ICD-10-CM | POA: Diagnosis not present

## 2015-09-06 DIAGNOSIS — E039 Hypothyroidism, unspecified: Secondary | ICD-10-CM

## 2015-09-07 LAB — TSH: TSH: 1.75 m[IU]/L (ref 0.40–4.50)

## 2015-10-06 DIAGNOSIS — J209 Acute bronchitis, unspecified: Secondary | ICD-10-CM | POA: Diagnosis not present

## 2015-10-06 DIAGNOSIS — J40 Bronchitis, not specified as acute or chronic: Secondary | ICD-10-CM | POA: Diagnosis not present

## 2015-10-06 DIAGNOSIS — R05 Cough: Secondary | ICD-10-CM | POA: Diagnosis not present

## 2015-10-23 DIAGNOSIS — R918 Other nonspecific abnormal finding of lung field: Secondary | ICD-10-CM | POA: Diagnosis not present

## 2015-10-23 DIAGNOSIS — R05 Cough: Secondary | ICD-10-CM | POA: Diagnosis not present

## 2015-11-01 ENCOUNTER — Encounter: Payer: Self-pay | Admitting: Physician Assistant

## 2015-11-01 ENCOUNTER — Telehealth: Payer: Self-pay | Admitting: Interventional Cardiology

## 2015-11-01 ENCOUNTER — Ambulatory Visit (INDEPENDENT_AMBULATORY_CARE_PROVIDER_SITE_OTHER): Payer: Medicare Other | Admitting: Physician Assistant

## 2015-11-01 VITALS — BP 120/74 | HR 72 | Ht 71.0 in | Wt 264.4 lb

## 2015-11-01 DIAGNOSIS — I10 Essential (primary) hypertension: Secondary | ICD-10-CM

## 2015-11-01 DIAGNOSIS — R0609 Other forms of dyspnea: Secondary | ICD-10-CM | POA: Diagnosis not present

## 2015-11-01 DIAGNOSIS — R0602 Shortness of breath: Secondary | ICD-10-CM | POA: Diagnosis not present

## 2015-11-01 DIAGNOSIS — I25118 Atherosclerotic heart disease of native coronary artery with other forms of angina pectoris: Secondary | ICD-10-CM

## 2015-11-01 DIAGNOSIS — R079 Chest pain, unspecified: Secondary | ICD-10-CM | POA: Diagnosis not present

## 2015-11-01 LAB — CBC
HCT: 38.5 % (ref 38.5–50.0)
Hemoglobin: 12.5 g/dL — ABNORMAL LOW (ref 13.2–17.1)
MCH: 30 pg (ref 27.0–33.0)
MCHC: 32.5 g/dL (ref 32.0–36.0)
MCV: 92.3 fL (ref 80.0–100.0)
MPV: 9.2 fL (ref 7.5–12.5)
Platelets: 200 K/uL (ref 140–400)
RBC: 4.17 MIL/uL — ABNORMAL LOW (ref 4.20–5.80)
RDW: 13.9 % (ref 11.0–15.0)
WBC: 6.6 K/uL (ref 3.8–10.8)

## 2015-11-01 LAB — BASIC METABOLIC PANEL
BUN: 21 mg/dL (ref 7–25)
CALCIUM: 8.5 mg/dL — AB (ref 8.6–10.3)
CO2: 29 mmol/L (ref 20–31)
CREATININE: 0.97 mg/dL (ref 0.70–1.11)
Chloride: 102 mmol/L (ref 98–110)
GLUCOSE: 94 mg/dL (ref 65–99)
Potassium: 4.3 mmol/L (ref 3.5–5.3)
SODIUM: 139 mmol/L (ref 135–146)

## 2015-11-01 NOTE — Patient Instructions (Addendum)
Medication Instructions:  Your physician recommends that you continue on your current medications as directed. Please refer to the Current Medication list given to you today.   Labwork: TODAY:  DDIMER, CBC, BNP, & BMET  Testing/Procedures: Your physician has requested that you have a lexiscan myoview. For further information please visit [email or web address]. Please follow instruction sheet, as given.     Follow-Up: Your physician recommends that you schedule a follow-up appointment in: AFTER THE TEST IS DONE WITH 1ST AVAILABLE EXTENDER    Any Other Special Instructions Will Be Listed Below (If Applicable).   Pharmacologic Stress Electrocardiogram A pharmacologic stress electrocardiogram is a heart (cardiac) test that uses nuclear imaging to evaluate the blood supply to your heart. This test may also be called a pharmacologic stress electrocardiography. Pharmacologic means that a medicine is used to increase your heart rate and blood pressure.  This stress test is done to find areas of poor blood flow to the heart by determining the extent of coronary artery disease (CAD). Some people exercise on a treadmill, which naturally increases the blood flow to the heart. For those people unable to exercise on a treadmill, a medicine is used. This medicine stimulates your heart and will cause your heart to beat harder and more quickly, as if you were exercising.  Pharmacologic stress tests can help determine:  The adequacy of blood flow to your heart during increased levels of activity in order to clear you for discharge home.  The extent of coronary artery blockage caused by CAD.  Your prognosis if you have suffered a heart attack.  The effectiveness of cardiac procedures done, such as an angioplasty, which can increase the circulation in your coronary arteries.  Causes of chest pain or pressure. LET [facility] CARE PROVIDER KNOW ABOUT:  Any allergies you have.  All medicines you are  taking, including vitamins, herbs, eye drops, creams, and over-the-counter medicines.  Previous problems you or members of your family have had with the use of anesthetics.  Any blood disorders you have.  Previous surgeries you have had.  Medical conditions you have.  Possibility of pregnancy, if this applies.  If you are currently breastfeeding. RISKS AND COMPLICATIONS Generally, this is a safe procedure. However, as with any procedure, complications can occur. Possible complications include:  You develop pain or pressure in the following areas:  Chest.  Jaw or neck.  Between your shoulder blades.  Radiating down your left arm.  Headache.  Dizziness or light-headedness.  Shortness of breath.  Increased or irregular heartbeat.  Low blood pressure.  Nausea or vomiting.  Flushing.  Redness going up the arm and slight pain during injection of medicine.  Heart attack (rare). BEFORE THE PROCEDURE   Avoid all forms of caffeine for 24 hours before your test or as directed by your health care provider. This includes coffee, tea (even decaffeinated tea), caffeinated sodas, chocolate, cocoa, and certain pain medicines.  Follow your health care provider's instructions regarding eating and drinking before the test.  Take your medicines as directed at regular times with water unless instructed otherwise. Exceptions may include:  If you have diabetes, ask how you are to take your insulin or pills. It is common to adjust insulin dosing the morning of the test.  If you are taking beta-blocker medicines, it is important to talk to your health care provider about these medicines well before the date of your test. Taking beta-blocker medicines may interfere with the test. In some cases, these medicines need to  be changed or stopped 24 hours or more before the test.  If you wear a nitroglycerin patch, it may need to be removed prior to the test. Ask your health care provider if the  patch should be removed before the test.  If you use an inhaler for any breathing condition, bring it with you to the test.  If you are an outpatient, bring a snack so you can eat right after the stress phase of the test.  Do not smoke for 4 hours prior to the test or as directed by your health care provider.  Do not apply lotions, powders, creams, or oils on your chest prior to the test.  Wear comfortable shoes and clothing. Let your health care provider know if you were unable to complete or follow the preparations for your test. PROCEDURE   Multiple patches (electrodes) will be put on your chest. If needed, small areas of your chest may be shaved to get better contact with the electrodes. Once the electrodes are attached to your body, multiple wires will be attached to the electrodes, and your heart rate will be monitored.  An IV access will be started. A nuclear trace (isotope) is given. The isotope may be given intravenously, or it may be swallowed. Nuclear refers to several types of radioactive isotopes, and the nuclear isotope lights up the arteries so that the nuclear images are clear. The isotope is absorbed by your body. This results in low radiation exposure.  A resting nuclear image is taken to show how your heart functions at rest.  A medicine is given through the IV access.  A second scan is done about 1 hour after the medicine injection and determines how your heart functions under stress.  During this stress phase, you will be connected to an electrocardiogram machine. Your blood pressure and oxygen levels will be monitored. AFTER THE PROCEDURE   Your heart rate and blood pressure will be monitored after the test.  You may return to your normal schedule, including diet,activities, and medicines, unless your health care provider tells you otherwise.   This information is not intended to replace advice given to you by your health care provider. Make sure you discuss any  questions you have with your health care provider.   Document Released: 10/04/2008 Document Revised: 05/23/2013 Document Reviewed: 01/23/2013 Elsevier Interactive Patient Education [employer].   If you need a refill on your cardiac medications before your next appointment, please call your pharmacy.

## 2015-11-01 NOTE — Telephone Encounter (Signed)
New Message   Per pt wife pt sick with bronchitis   Pt c/o Shortness Of Breath: STAT if SOB developed within the last 24 hours or pt is noticeably SOB on the phone  1. Are you currently SOB (can you hear that pt is SOB on the phone)? yes  2. How long have you been experiencing SOB? About a month  3. Are you SOB when sitting or when up moving around? Walking around   4. Are you currently experiencing any other symptoms? Heavy sweating  Previous experiences of blockage

## 2015-11-01 NOTE — Progress Notes (Signed)
CARDIOLOGY OFFICE NOTE  Date:  11/01/2015    Bryan Hatfield Date of Birth: [birth year] Medical Record [id number]  PCP:  Bryan Lecher, MD  Cardiologist:  Dr. Tamala Hatfield  Chief Complaint  Patient presents with  . Follow-up    seen for Dr. Tamala Hatfield    History of Present Illness: Bryan Hatfield is a 82 y.o. male who presents today for cardiology evaluation. He has history of CAD s/p PTCA in 2001, hypertension, vascular disease, HLD, COPD and OSA on CPAP. His last Myoview on 05/09/2012 showed possible inferior lateral ischemia. His last heart catheterization on 05/31/2012 shows heavy three-vessel coronary calcification, mild to moderate proximal to mid LAD disease up to 40%, 50% proximal left circumflex disease, despite heavy constipation, RCA is widely patent, normal LV function with normal LVEDP.  For the last several month, he has been having increasing exertional dyspnea and intermittent chest pain. It does not appears that chest pain is exertional related. He also has 1+ pitting edema on physical exam. He has been seen by his PCP who felt that his lung is clear. He recently got over bronchitis with several course of antibiotics. He was sent to the cardiology office today for evaluation of potential etiology.    Past Medical History  Diagnosis Date  . CAD (coronary artery disease)     a. PTCA 2001. b. f/u cath 2008: mild nonobstructive CAD.  Marland Kitchen Hypertension   . Acute respiratory failure ([place]) 09/25/11    hypoxic  . Peripheral vascular disease ([place])     patient denies knowledge of, but listed in his chart  . High cholesterol   . Anxiety   . Pleural effusion     Parapneumonic ?r/t PNA s/p thoracentesis 2008  . GERD (gastroesophageal reflux disease)   . Colon polyps   . COPD (chronic obstructive pulmonary disease) ([place])   . Pneumonia     hx  . Sleep apnea     cpap     Past Surgical History  Procedure Laterality Date  . Hemorrhoid surgery  1960's  . Coronary angioplasty  01/2000   . Cataract extraction w/ intraocular lens  implant, bilateral  ~ 2006  . Cholecystectomy N/A 10/11/2012    Procedure: LAPAROSCOPIC CHOLECYSTECTOMY WITH INTRAOPERATIVE CHOLANGIOGRAM;  Surgeon: Bryan Hector, MD;  Location: [place];  Service: General;  Laterality: N/A;     Medications: Current Outpatient Prescriptions  Medication Sig Dispense Refill  . albuterol (PROVENTIL) (2.5 MG/3ML) 0.083% nebulizer solution     . Ascorbic Acid (VITAMIN C PO) Take 1 tablet by mouth daily.    Marland Kitchen aspirin EC 81 MG tablet Take 81 mg by mouth daily.      Marland Kitchen atorvastatin (LIPITOR) 40 MG tablet Take 1 tablet (40 mg total) by mouth at bedtime. 90 tablet 3  . budesonide-formoterol (SYMBICORT) 80-4.5 MCG/ACT inhaler Inhale 2 puffs into the lungs at bedtime. FAX:[phone] 3 Inhaler prn  . escitalopram (LEXAPRO) 10 MG tablet Take 1 tablet (10 mg total) by mouth daily. 90 tablet 1  . furosemide (LASIX) 40 MG tablet TAKE 1 TABLET BY MOUTH  EVERY OTHER DAY USUALLY ON  MONDAY,WEDNESDAY,FRIDAY 45 tablet 0  . imiquimod (ALDARA) 5 % cream Apply topically at bedtime. 12 each 1  . levothyroxine (SYNTHROID, LEVOTHROID) 112 MCG tablet TAKE 1 TABLET(112 MCG) BY MOUTH DAILY BEFORE BREAKFAST 90 tablet 3  . Multiple Vitamins-Minerals (ICAPS MV PO) Take 1 capsule by mouth daily.    . Nebulizers (COMPRESSOR/NEBULIZER) MISC by Does not apply route.    Marland Kitchen  NITROSTAT 0.4 MG SL tablet Dissolve 1 tablet under the tongue every 5 minutes as  needed chest pain 25 tablet 1  . triamcinolone cream (KENALOG) 0.1 % Apply to affected area(s)  Daily PRN for up to 2  weeks. Avoid face. 80 g 2  . zolpidem (AMBIEN) 5 MG tablet TAKE 1 TABLET BY MOUTH EVERY DAY AT BEDTIME AS NEEDED FOR SLEEP 90 tablet 0   No current facility-administered medications for this visit.    Allergies: No Known Allergies  Social History: The patient  reports that he quit smoking about 33 years ago. His smoking use included Cigarettes. He has a 10 pack-year smoking  history. He has never used smokeless tobacco. He reports that he does not drink alcohol or use illicit drugs.   Family History: The patient's family history includes Arthritis in his mother and sister; Asthma in his mother; Emphysema in his mother; Stroke in his father, mother, and sister. There is no history of Heart attack.   Review of Systems: Please see the history of present illness.   Otherwise, the review of systems is positive for DOE and LE edema.   All other systems are reviewed and negative.   Physical Exam: VS:  BP 120/74 mmHg  Pulse 72  Ht 5\' 11"  (1.803 m)  Wt 264 lb 6.4 oz (119.931 kg)  BMI 36.89 kg/m2 .  BMI Body mass index is 36.89 kg/(m^2).  Wt Readings from Last 3 Encounters:  11/01/15 264 lb 6.4 oz (119.931 kg)  07/30/15 256 lb (116.121 kg)  05/31/15 261 lb (118.389 kg)    General: Pleasant. Well developed, well nourished and in no acute distress.  HEENT: Normal. Neck: Supple, no JVD, carotid bruits, or masses noted.  Cardiac: Regular rate and rhythm. No murmurs, rubs, or gallops. 1+ pitting edema bilaterally Respiratory:  Lungs are clear to auscultation bilaterally with normal work of breathing.  GI: Soft and nontender.  MS: No deformity or atrophy. Gait and ROM intact. Skin: Warm and dry. Color is normal.  Neuro:  Strength and sensation are intact and no gross focal deficits noted.  Psych: Alert, appropriate and with normal affect.   LABORATORY DATA:  EKG:  EKG is ordered today. This demonstrates NSR with lone TWI in lead III but not in adjacent leads, not significant.  Lab Results  Component Value Date   WBC 7.3 05/29/2015   HGB 13.6 05/29/2015   HCT 41.3 05/29/2015   PLT 184.0 05/29/2015   GLUCOSE 95 05/29/2015   CHOL 145 09/05/2014   TRIG 84 09/05/2014   HDL 59 09/05/2014   LDLCALC 69 09/05/2014   ALT 17 08/30/2014   AST 22 08/30/2014   NA 139 05/29/2015   K 4.1 05/29/2015   CL 102 05/29/2015   CREATININE 0.86 05/29/2015   BUN 22  05/29/2015   CO2 29 05/29/2015   TSH 1.75 09/06/2015   INR 1.00 05/08/2012   HGBA1C 6.0* 09/05/2014     ProBNP (last 3 results)  Recent Labs  05/29/15 1207  PROBNP 133.0*     Other Studies Reviewed Today:  Nuc 05/09/2012 IMPRESSION:  1. Suspect inducible myocardial ischemia in the inferolateral wall. 2. Normal left ventricular wall motion study, with estimated ejection fraction of 67%.     Cath 05/31/2012 LEFT VENTRICULOGRAM: Left ventricular angiogram was done in the 30 RAO projection and revealed normal left ventricular wall motion and systolic function with an estimated ejection fraction of 60%.   IMPRESSIONS: 1. Heavy three-vessel coronary calcification  2.  Mild to moderate proximal and mid LAD disease up to 40%. There is proximal circumflex and OM one disease up to 50%. Despite heavy calcification the right coronary is widely patent.  3. Normal left ventricular systolic function with normal LVEDP and pulmonary capillary wedge  4. Normal pulmonary artery pressures   RECOMMENDATION: There is no cardiac explanation for the patient's dyspnea. The nuclear study performed while hospitalized was a false positive study. No further cardiac evaluation is indicated..    Assessment/Plan:  1. DOE and intermittent chest pain: Although per patient, this is the same symptom prior to his PTCA in 2001, this is also the same symptom he had prior to his Myoview on the subsequent nonobstructive cath in 2013. I have discussed with Dr. Tamala Hatfield, we plan to repeat lexiscan myoview. If myoview shows ischemia in the same region as 2013, no further workup will be done.  - obtain CBC, BMET, BNP and d-dimer given SOB. Low suspicion for PE  2. LE edema: which appears to be chronic, he has 1+ pitting edema on exam.   - i will continue his lasix dose at this time, his lung is clear, no sign of acute HF  3. CAD s/p PTCA in 2001: see #1  4. Hypertension: well controlled  5. HLD:  continue lipitor  6. OSA on CPAP   Current medicines are reviewed with the patient today.  The patient does not have concerns regarding medicines other than what has been noted above.  The following changes have been made:  See above.  Labs/ tests ordered today include:    Orders Placed This Encounter  Procedures  . D-Dimer, Quantitative  . CBC  . Basic Metabolic Panel (BMET)  . B Nat Peptide  . Myocardial Perfusion Imaging  . EKG 12-Lead     Disposition:   FU with APP in 2 weeks.   Patient is agreeable to this plan and will call if any problems develop in the interim.   Signed: [name] PA-C 11/01/2015 4:53 PM  [facility] [address] [place], [place]  [address] Phone: [phone] Fax: [phone]

## 2015-11-01 NOTE — Telephone Encounter (Signed)
I spoke with patient and pt's wife. Pt states he is short of breath and has LE edema, both of these for 2-3 months, no new changes in these symptoms recently. Pt states he has gained weight in the past several months but is unable to tell me how much weight he has gained. Pt states he has had bronchitis for the past month and has had 2 prescriptions of antibiotics. Pt states he went to the [place] this AM and was told his lungs were clear, to call his cardiologist for follow up.  I reviewed with Bryan Deforest, PA-  Per Bryan Hatfield- Schedule patient for appt today at [facility] or [place] for pt.

## 2015-11-01 NOTE — Telephone Encounter (Signed)
Pt's wife advised pt has appt today at [facility] with PA.

## 2015-11-01 NOTE — Telephone Encounter (Signed)
Left message to call back  

## 2015-11-02 LAB — BRAIN NATRIURETIC PEPTIDE: BRAIN NATRIURETIC PEPTIDE: 34.5 pg/mL (ref <100)

## 2015-11-02 LAB — D-DIMER, QUANTITATIVE (NOT AT ARMC): D DIMER QUANT: 0.51 ug{FEU}/mL — AB (ref 0.00–0.48)

## 2015-11-04 ENCOUNTER — Telehealth (HOSPITAL_COMMUNITY): Payer: Self-pay | Admitting: *Deleted

## 2015-11-04 NOTE — Telephone Encounter (Signed)
Patient given detailed instructions per Myocardial Perfusion Study Information Sheet for the test on 11/05/15 at 7:15. Patient notified to arrive 15 minutes early and that it is imperative to arrive on time for appointment to keep from having the test rescheduled.  If you need to cancel or reschedule your appointment, please call the office within 24 hours of your appointment. Failure to do so may result in a cancellation of your appointment, and a $50 no show fee. Patient verbalized understanding.Bryan Hatfield

## 2015-11-05 ENCOUNTER — Ambulatory Visit (HOSPITAL_COMMUNITY): Payer: Medicare Other | Attending: Cardiology

## 2015-11-05 DIAGNOSIS — I739 Peripheral vascular disease, unspecified: Secondary | ICD-10-CM | POA: Insufficient documentation

## 2015-11-05 DIAGNOSIS — I10 Essential (primary) hypertension: Secondary | ICD-10-CM | POA: Insufficient documentation

## 2015-11-05 DIAGNOSIS — R0609 Other forms of dyspnea: Secondary | ICD-10-CM | POA: Diagnosis not present

## 2015-11-05 DIAGNOSIS — R079 Chest pain, unspecified: Secondary | ICD-10-CM | POA: Insufficient documentation

## 2015-11-05 DIAGNOSIS — R5383 Other fatigue: Secondary | ICD-10-CM | POA: Insufficient documentation

## 2015-11-05 LAB — MYOCARDIAL PERFUSION IMAGING
CHL CUP NUCLEAR SDS: 3
CHL CUP NUCLEAR SRS: 3
CHL CUP NUCLEAR SSS: 6
CHL CUP RESTING HR STRESS: 57 {beats}/min
LHR: 0.34
LV sys vol: 41 mL
LVDIAVOL: 116 mL (ref 62–150)
Peak HR: 81 {beats}/min
TID: 0.96

## 2015-11-05 MED ORDER — TECHNETIUM TC 99M TETROFOSMIN IV KIT
32.6000 | PACK | Freq: Once | INTRAVENOUS | Status: AC | PRN
  Administered 2015-11-05: 33 via INTRAVENOUS
  Filled 2015-11-05: qty 33

## 2015-11-05 MED ORDER — REGADENOSON 0.4 MG/5ML IV SOLN
0.4000 mg | Freq: Once | INTRAVENOUS | Status: AC
  Administered 2015-11-05: 0.4 mg via INTRAVENOUS

## 2015-11-05 MED ORDER — TECHNETIUM TC 99M TETROFOSMIN IV KIT
10.7000 | PACK | Freq: Once | INTRAVENOUS | Status: AC | PRN
  Administered 2015-11-05: 11 via INTRAVENOUS
  Filled 2015-11-05: qty 11

## 2015-11-06 DIAGNOSIS — J40 Bronchitis, not specified as acute or chronic: Secondary | ICD-10-CM | POA: Diagnosis not present

## 2015-11-07 ENCOUNTER — Telehealth: Payer: Self-pay | Admitting: *Deleted

## 2015-11-07 ENCOUNTER — Other Ambulatory Visit: Payer: Self-pay | Admitting: Physician Assistant

## 2015-11-07 DIAGNOSIS — R7989 Other specified abnormal findings of blood chemistry: Secondary | ICD-10-CM

## 2015-11-07 DIAGNOSIS — R0600 Dyspnea, unspecified: Secondary | ICD-10-CM

## 2015-11-07 NOTE — Telephone Encounter (Signed)
Called pt with stress test results.  He is still experiencing SOB, so CTA / Chest with PE protocol has been ordered. Pt aware that someone will call and get this scheduled. Pt verbalized appreciation and understanding. cta

## 2015-11-11 ENCOUNTER — Ambulatory Visit (HOSPITAL_COMMUNITY): Admission: RE | Admit: 2015-11-11 | Payer: Medicare Other | Source: Ambulatory Visit

## 2015-11-11 ENCOUNTER — Ambulatory Visit (INDEPENDENT_AMBULATORY_CARE_PROVIDER_SITE_OTHER)
Admission: RE | Admit: 2015-11-11 | Discharge: 2015-11-11 | Disposition: A | Discharge status: Home / Self Care | Payer: Medicare Other | Source: Ambulatory Visit | Attending: Physician Assistant | Admitting: Physician Assistant

## 2015-11-11 ENCOUNTER — Telehealth: Payer: Self-pay | Admitting: *Deleted

## 2015-11-11 DIAGNOSIS — R7989 Other specified abnormal findings of blood chemistry: Secondary | ICD-10-CM

## 2015-11-11 DIAGNOSIS — R0600 Dyspnea, unspecified: Secondary | ICD-10-CM

## 2015-11-11 DIAGNOSIS — R791 Abnormal coagulation profile: Secondary | ICD-10-CM | POA: Diagnosis not present

## 2015-11-11 MED ORDER — IOPAMIDOL (ISOVUE-370) INJECTION 76%
100.0000 mL | Freq: Once | INTRAVENOUS | Status: AC | PRN
  Administered 2015-11-11: 100 mL via INTRAVENOUS

## 2015-11-11 NOTE — Telephone Encounter (Signed)
Copied from CTA chest report done today 11/11/15:  MPRESSION: No evidence of pulmonary emboli.  10 mm right lower lobe nodule which in retrospect was present vaguely on a prior exam from 2008. It is better visualized on the current exam due to do thinner sections. Given the stability it is consistent with a benign etiology.    No evidence of pulmonary emboli called to Triage by [facility] in [place].  Pt advised by CT no evidence of pulmonary emboli, I will forward to [name], [place] for review.

## 2015-11-11 NOTE — Telephone Encounter (Signed)
Given negative CTA of chest, given recent negative stress test, unlikely to perform further cardiac workup for SOB, i would monitor it for now

## 2015-11-12 ENCOUNTER — Telehealth: Payer: Self-pay | Admitting: Interventional Cardiology

## 2015-11-12 NOTE — Telephone Encounter (Signed)
Pt's wife calling for CT results-needs to take results to another doctor appt tomorrow--pls call [phone] or [phone] per pt ok to speak with wife Cristan Septer

## 2015-11-12 NOTE — Telephone Encounter (Signed)
Spoke with pts wife, per verbal authorization from pt.  We have discuss CTA results.  Pt will come and get records to take to the [place] appt for 11/13/15.

## 2015-11-22 ENCOUNTER — Other Ambulatory Visit: Payer: Self-pay | Admitting: Physician Assistant

## 2015-11-22 ENCOUNTER — Ambulatory Visit (INDEPENDENT_AMBULATORY_CARE_PROVIDER_SITE_OTHER): Payer: Medicare Other | Admitting: Physician Assistant

## 2015-11-22 ENCOUNTER — Encounter: Payer: Self-pay | Admitting: *Deleted

## 2015-11-22 ENCOUNTER — Encounter: Payer: Self-pay | Admitting: Physician Assistant

## 2015-11-22 VITALS — BP 122/72 | HR 68 | Ht 71.0 in | Wt 271.4 lb

## 2015-11-22 DIAGNOSIS — I2510 Atherosclerotic heart disease of native coronary artery without angina pectoris: Secondary | ICD-10-CM

## 2015-11-22 DIAGNOSIS — J449 Chronic obstructive pulmonary disease, unspecified: Secondary | ICD-10-CM

## 2015-11-22 DIAGNOSIS — R0600 Dyspnea, unspecified: Secondary | ICD-10-CM

## 2015-11-22 DIAGNOSIS — I10 Essential (primary) hypertension: Secondary | ICD-10-CM

## 2015-11-22 DIAGNOSIS — R0602 Shortness of breath: Secondary | ICD-10-CM | POA: Diagnosis not present

## 2015-11-22 DIAGNOSIS — R61 Generalized hyperhidrosis: Secondary | ICD-10-CM | POA: Diagnosis not present

## 2015-11-22 LAB — COMPREHENSIVE METABOLIC PANEL
ALBUMIN: 3.6 g/dL (ref 3.6–5.1)
ALK PHOS: 77 U/L (ref 40–115)
ALT: 25 U/L (ref 9–46)
AST: 28 U/L (ref 10–35)
BILIRUBIN TOTAL: 0.4 mg/dL (ref 0.2–1.2)
BUN: 17 mg/dL (ref 7–25)
CALCIUM: 8.3 mg/dL — AB (ref 8.6–10.3)
CO2: 27 mmol/L (ref 20–31)
Chloride: 103 mmol/L (ref 98–110)
Creat: 0.9 mg/dL (ref 0.70–1.11)
Glucose, Bld: 92 mg/dL (ref 65–99)
Potassium: 4.1 mmol/L (ref 3.5–5.3)
Sodium: 140 mmol/L (ref 135–146)
TOTAL PROTEIN: 5.9 g/dL — AB (ref 6.1–8.1)

## 2015-11-22 LAB — CBC WITH DIFFERENTIAL/PLATELET
BASOS ABS: 0 {cells}/uL (ref 0–200)
Basophils Relative: 0 %
EOS ABS: 497 {cells}/uL (ref 15–500)
Eosinophils Relative: 7 %
HEMATOCRIT: 36.9 % — AB (ref 38.5–50.0)
Hemoglobin: 12.4 g/dL — ABNORMAL LOW (ref 13.2–17.1)
Lymphocytes Relative: 21 %
Lymphs Abs: 1491 cells/uL (ref 850–3900)
MCH: 30.4 pg (ref 27.0–33.0)
MCHC: 33.6 g/dL (ref 32.0–36.0)
MCV: 90.4 fL (ref 80.0–100.0)
MONOS PCT: 9 %
MPV: 9.9 fL (ref 7.5–12.5)
Monocytes Absolute: 639 cells/uL (ref 200–950)
NEUTROS ABS: 4473 {cells}/uL (ref 1500–7800)
Neutrophils Relative %: 63 %
PLATELETS: 192 10*3/uL (ref 140–400)
RBC: 4.08 MIL/uL — ABNORMAL LOW (ref 4.20–5.80)
RDW: 14.4 % (ref 11.0–15.0)
WBC: 7.1 10*3/uL (ref 3.8–10.8)

## 2015-11-22 LAB — PROTIME-INR
INR: 1
Prothrombin Time: 10.8 s (ref 9.0–11.5)

## 2015-11-22 MED ORDER — ISOSORBIDE MONONITRATE ER 30 MG PO TB24: 30.0000 mg | ORAL_TABLET | Freq: Every day | ORAL | Status: DC

## 2015-11-22 NOTE — Patient Instructions (Addendum)
Medication Instructions:  Your physician has recommended you make the following change in your medication:  1.  START the Imdur 30 mg taking 1 tablet daily (IF YOU SEE THAT THIS MEDICATION IS WORKING FOR YOU, CALL OUR OFFICE AND WE WILL SEND IN A 90 DAY SUPPLY TO YOUR MAIL ORDER)   Labwork: TODAY:  CMET, CBC W/DIFF,  PT/INR   Testing/Procedures: Your physician has requested that you have a cardiac catheterization. Cardiac catheterization is used to diagnose and/or treat various heart conditions. Doctors may recommend this procedure for a number of different reasons. The most common reason is to evaluate chest pain. Chest pain can be a symptom of coronary artery disease (CAD), and cardiac catheterization can show whether plaque is narrowing or blocking your heart's arteries. This procedure is also used to evaluate the valves, as well as measure the blood flow and oxygen levels in different parts of your heart. For further information please visit [email or web address]. Please follow instruction sheet, as given.    Follow-Up: Your physician recommends that you schedule a follow-up appointment in: [place], PA-C OR HAO MENG, PA-C   Any Other Special Instructions Will Be Listed Below (If Applicable).  Coronary Angiogram A coronary angiogram, also called coronary angiography, is an X-ray procedure used to look at the arteries in the heart. In this procedure, a dye (contrast dye) is injected through a long, hollow tube (catheter). The catheter is about the size of a piece of cooked spaghetti and is inserted through your groin, wrist, or arm. The dye is injected into each artery, and X-rays are then taken to show if there is a blockage in the arteries of your heart. LET [facility] CARE PROVIDER KNOW ABOUT:  Any allergies you have, including allergies to shellfish or contrast dye.   All medicines you are taking, including vitamins, herbs, eye drops, creams, and over-the-counter  medicines.   Previous problems you or members of your family have had with the use of anesthetics.   Any blood disorders you have.   Previous surgeries you have had.  History of kidney problems or failure.   Other medical conditions you have. RISKS AND COMPLICATIONS  Generally, a coronary angiogram is a safe procedure. However, problems can occur and include:  Allergic reaction to the dye.  Bleeding from the access site or other locations.  Kidney injury, especially in people with impaired kidney function.  Stroke (rare).  Heart attack (rare). BEFORE THE PROCEDURE   Do not eat or drink anything after midnight the night before the procedure or as directed by your health care provider.   Ask your health care provider about changing or stopping your regular medicines. This is especially important if you are taking diabetes medicines or blood thinners. PROCEDURE  You may be given a medicine to help you relax (sedative) before the procedure. This medicine is given through an intravenous (IV) access tube that is inserted into one of your veins.   The area where the catheter will be inserted will be washed and shaved. This is usually done in the groin but may be done in the fold of your arm (near your elbow) or in the wrist.   A medicine will be given to numb the area where the catheter will be inserted (local anesthetic).   The health care provider will insert the catheter into an artery. The catheter will be guided by using a special type of X-ray (fluoroscopy) of the blood vessel being examined.   A  special dye will then be injected into the catheter, and X-rays will be taken. The dye will help to show where any narrowing or blockages are located in the heart arteries.  AFTER THE PROCEDURE   If the procedure is done through the leg, you will be kept in bed lying flat for several hours. You will be instructed to not bend or cross your legs.  The insertion site will  be checked frequently.   The pulse in your feet or wrist will be checked frequently.   Additional blood tests, X-rays, and an electrocardiogram may be done.    This information is not intended to replace advice given to you by your health care provider. Make sure you discuss any questions you have with your health care provider.   Document Released: 11/22/2002 Document Revised: 06/08/2014 Document Reviewed: 10/10/2012 Elsevier Interactive Patient Education [employer].    If you need a refill on your cardiac medications before your next appointment, please call your pharmacy.

## 2015-11-22 NOTE — Telephone Encounter (Signed)
Patient requesting a ninety day supply. Ok to go ahead and refill for this quantity?

## 2015-11-22 NOTE — Progress Notes (Signed)
Cardiology Office Note    Date:  11/22/2015  ID:  Bryan Hatfield, DOB [birth year], MRN [id number] PCP:  Beatrice Lecher, MD  Cardiologist:  Dr. [name]   Chief Complaint: f/u dyspnea  History of Present Illness:  Bryan Hatfield is a 82 y.o. male with history of CAD (s/p PTCA 2001), HTN, HLD, COPD, OSA on CPAP, parapneumonic pleural effusion r/t PNA s/p thoracentesis 2008 who presents for f/u. Last cath 05/2012 showed heavy 3-vessel calcification, 40% p-mLAD, 50% OM1, widely patent RCA, normal LVEDP and normal PA pressures. He was seen by [name] PA-C on 11/01/15 for chest pain and dyspnea. He had recently gotten over bronchitis. Nuclear stress test was normal, no evidence of ischemia, EF 65%. CTA 11/11/15 showed no evidence of PE, stable pulm nodule consistent with benign etiology. Labs notable for normal Cr 0.97, Hgb 12.5, BNP wnl.  He comes in today for follow-up. He remains concerned that he continues to have significant dyspnea and diaphoresis with exertion. He used to go to the [facility] 3x/week but stopped when he became sick last month. He now finds it hard to go to the mailbox without getting SOB and profusely sweaty. He and his wife are concerned because this is what he felt prior to his PTCA in 2001. He has not really had further chest pain. No syncope.    Past Medical History  Diagnosis Date  . CAD (coronary artery disease)     a. PTCA 2001. b. f/u cath 05/2012 showed heavy 3-vessel calcification, 40% p-mLAD, 50% OM1, widely patent RCA, normal LVEDP and normal PA pressures  . Hypertension   . Acute respiratory failure ([place]) 09/25/11    hypoxic  . Peripheral vascular disease ([place])     patient denies knowledge of, but listed in his chart  . High cholesterol   . Anxiety   . Pleural effusion     Parapneumonic ?r/t PNA s/p thoracentesis 2008  . GERD (gastroesophageal reflux disease)   . Colon polyps   . COPD (chronic obstructive pulmonary disease) ([place])   . Pneumonia     hx  . Sleep apnea      cpap     Past Surgical History  Procedure Laterality Date  . Hemorrhoid surgery  1960's  . Coronary angioplasty  01/2000  . Cataract extraction w/ intraocular lens  implant, bilateral  ~ 2006  . Cholecystectomy N/A 10/11/2012    Procedure: LAPAROSCOPIC CHOLECYSTECTOMY WITH INTRAOPERATIVE CHOLANGIOGRAM;  Surgeon: Adin Hector, MD;  Location: [facility];  Service: General;  Laterality: N/A;    Current Medications: Current Outpatient Prescriptions  Medication Sig Dispense Refill  . albuterol (PROVENTIL) (2.5 MG/3ML) 0.083% nebulizer solution Take 2.5 mg by nebulization as directed. Breathing treatment    . Ascorbic Acid (VITAMIN C PO) Take 1 tablet by mouth daily.    Marland Kitchen aspirin EC 81 MG tablet Take 81 mg by mouth daily.      Marland Kitchen atorvastatin (LIPITOR) 40 MG tablet Take 1 tablet (40 mg total) by mouth at bedtime. 90 tablet 3  . budesonide-formoterol (SYMBICORT) 80-4.5 MCG/ACT inhaler Inhale 2 puffs into the lungs at bedtime. FAX:[phone] 3 Inhaler prn  . escitalopram (LEXAPRO) 10 MG tablet Take 1 tablet (10 mg total) by mouth daily. 90 tablet 1  . furosemide (LASIX) 40 MG tablet TAKE 1 TABLET BY MOUTH  EVERY OTHER DAY USUALLY ON  MONDAY,WEDNESDAY,FRIDAY 45 tablet 0  . imiquimod (ALDARA) 5 % cream Apply topically as directed. Apply at bedtime    . levothyroxine (  SYNTHROID, LEVOTHROID) 112 MCG tablet TAKE 1 TABLET(112 MCG) BY MOUTH DAILY BEFORE BREAKFAST 90 tablet 3  . Multiple Vitamins-Minerals (ICAPS MV PO) Take 1 capsule by mouth daily.    . Nebulizers (COMPRESSOR/NEBULIZER) MISC by Does not apply route.    . nitroGLYCERIN (NITROSTAT) 0.4 MG SL tablet Place 0.4 mg under the tongue every 5 (five) minutes as needed for chest pain (x 3 doses).    . triamcinolone cream (KENALOG) 0.1 % as directed. Apply to affected area(s)  Daily as needed for up to 2 weeks. Avoid face.     No current facility-administered medications for this visit.     Allergies:   Review of patient's allergies  indicates no known allergies.   Social History   Social History  . Marital Status: Married    Spouse Name: N/A  . Number of Children: N/A  . Years of Education: N/A   Occupational History  . Retired     Surveyor, quantity - Civil engineer, contracting and [employer]   Social History Main Topics  . Smoking status: Former Smoker -- 1.00 packs/day for 10 years    Types: Cigarettes    Quit date: 06/01/1982  . Smokeless tobacco: Never Used     Comment: "quit smoking ~ 25 years ago; smoked ~ 1 pk/wk"  . Alcohol Use: No  . Drug Use: No  . Sexual Activity: No   Other Topics Concern  . None   Social History Narrative     Family History:  The patient's family history includes Arthritis in his mother and sister; Asthma in his mother; Emphysema in his mother; Stroke in his father, mother, and sister. There is no history of Heart attack.   ROS:   Please see the history of present illness.  All other systems are reviewed and otherwise negative.    PHYSICAL EXAM:   VS:  BP 122/72 mmHg  Pulse 68  Ht 5\' 11"  (1.803 m)  Wt 271 lb 6.4 oz (123.106 kg)  BMI 37.87 kg/m2  SpO2 97%  BMI: Body mass index is 37.87 kg/(m^2). GEN: Well nourished, well developed WM, in no acute distress, kyphotic posture HEENT: normocephalic, atraumatic Neck: no JVD, carotid bruits, or masses Cardiac: RRR; no murmurs, rubs, or gallops, no edema  Respiratory:  clear to auscultation bilaterally, normal work of breathing GI: soft, nontender, nondistended, + BS MS: no deformity or atrophy Skin: warm and dry, no rash Neuro:  Alert and Oriented x 3, Strength and sensation are intact, follows commands Psych: euthymic mood, full affect  Wt Readings from Last 3 Encounters:  11/22/15 271 lb 6.4 oz (123.106 kg)  11/05/15 264 lb (119.75 kg)  11/01/15 264 lb 6.4 oz (119.931 kg)      Studies/Labs Reviewed:   EKG:  EKG was not ordered today. EKG was reviewed from 11/01/15 - NSR, poor R wave progression, TWI III  Recent Labs: 05/29/2015: Pro B  Natriuretic peptide (BNP) 133.0* 09/06/2015: TSH 1.75 11/01/2015: Brain Natriuretic Peptide 34.5; BUN 21; Creat 0.97; Hemoglobin 12.5*; Platelets 200; Potassium 4.3; Sodium 139   Lipid Panel    Component Value Date/Time   CHOL 145 09/05/2014 0837   TRIG 84 09/05/2014 0837   HDL 59 09/05/2014 0837   CHOLHDL 2.5 09/05/2014 0837   VLDL 17 09/05/2014 0837   LDLCALC 69 09/05/2014 0837    Additional studies/ records that were reviewed today include: Summarized above.    ASSESSMENT & PLAN:   1. SOB and diaphoresis - recent nuc, CTA, and labwork (including BNP) unrevealing.  I do not hear any significant valvular disease on exam. The patient and his wife are concerned about the possibility of a false negative nuclear stress test. They are seeking definitive cardiac evaluation. Given his symptoms as well as the heavy calcification seen in 2013, I think it is reasonable to proceed with LHC. I d/w Dr. Angelena Form (DOD) who agrees. Risks and benefits of cardiac catheterization have been discussed with the patient.  These include bleeding, infection, kidney damage, stroke, heart attack, death. The patient understands these risks and is willing to proceed. Will get pre-cath labs today. Will add Imdur 30mg  daily. If his LHC is unrevealing, I have discussed with him the need for further evaluation with pulmonology. Suspect a component of deconditioning ever since his illness in May. 2. CAD - continue ASA, statin. He is not on BB presumably due to baseline HR 60s/COPD. Will not start at this time. Add Imdur as above to see if it helps with his sx.  3. HTN - controlled. Follow with addition of Imdur. 4. COPD - has f/u with pulm. Only 5 year history of smoking, but reports prior asbestos exposure.  Disposition: F/u with 1 month with me or [name] PA-C.   Medication Adjustments/Labs and Tests Ordered: Current medicines are reviewed at length with the patient today.  Concerns regarding medicines are outlined  above. Medication changes, Labs and Tests ordered today are summarized above and listed in the Patient Instructions accessible in Encounters.   Raechel Ache PA-C  11/22/2015 2:20 PM    [facility] [place], [place], [place]  [address] Phone: [phone]; Fax: [phone]

## 2015-11-25 ENCOUNTER — Encounter (HOSPITAL_COMMUNITY)
Admission: RE | Disposition: A | Discharge status: Home / Self Care | Payer: Self-pay | Source: Ambulatory Visit | Attending: Cardiology

## 2015-11-25 ENCOUNTER — Telehealth: Payer: Self-pay | Admitting: Physician Assistant

## 2015-11-25 ENCOUNTER — Encounter (HOSPITAL_COMMUNITY): Payer: Self-pay | Admitting: Cardiology

## 2015-11-25 ENCOUNTER — Encounter: Payer: Self-pay | Admitting: *Deleted

## 2015-11-25 ENCOUNTER — Telehealth: Payer: Self-pay | Admitting: *Deleted

## 2015-11-25 ENCOUNTER — Ambulatory Visit (HOSPITAL_COMMUNITY)
Admission: RE | Admit: 2015-11-25 | Discharge: 2015-11-25 | Disposition: A | Discharge status: Home / Self Care | Payer: Medicare Other | Source: Ambulatory Visit | Attending: Cardiology | Admitting: Cardiology

## 2015-11-25 DIAGNOSIS — F419 Anxiety disorder, unspecified: Secondary | ICD-10-CM | POA: Insufficient documentation

## 2015-11-25 DIAGNOSIS — I2510 Atherosclerotic heart disease of native coronary artery without angina pectoris: Secondary | ICD-10-CM | POA: Insufficient documentation

## 2015-11-25 DIAGNOSIS — Z87891 Personal history of nicotine dependence: Secondary | ICD-10-CM | POA: Diagnosis not present

## 2015-11-25 DIAGNOSIS — G4733 Obstructive sleep apnea (adult) (pediatric): Secondary | ICD-10-CM | POA: Insufficient documentation

## 2015-11-25 DIAGNOSIS — Z79899 Other long term (current) drug therapy: Secondary | ICD-10-CM | POA: Diagnosis not present

## 2015-11-25 DIAGNOSIS — R0609 Other forms of dyspnea: Secondary | ICD-10-CM | POA: Diagnosis present

## 2015-11-25 DIAGNOSIS — E785 Hyperlipidemia, unspecified: Secondary | ICD-10-CM | POA: Diagnosis present

## 2015-11-25 DIAGNOSIS — Z9861 Coronary angioplasty status: Secondary | ICD-10-CM | POA: Diagnosis not present

## 2015-11-25 DIAGNOSIS — Z7951 Long term (current) use of inhaled steroids: Secondary | ICD-10-CM | POA: Diagnosis not present

## 2015-11-25 DIAGNOSIS — I2584 Coronary atherosclerosis due to calcified coronary lesion: Secondary | ICD-10-CM | POA: Diagnosis not present

## 2015-11-25 DIAGNOSIS — R0600 Dyspnea, unspecified: Secondary | ICD-10-CM | POA: Diagnosis present

## 2015-11-25 DIAGNOSIS — J449 Chronic obstructive pulmonary disease, unspecified: Secondary | ICD-10-CM | POA: Insufficient documentation

## 2015-11-25 DIAGNOSIS — I10 Essential (primary) hypertension: Secondary | ICD-10-CM | POA: Insufficient documentation

## 2015-11-25 DIAGNOSIS — E7800 Pure hypercholesterolemia, unspecified: Secondary | ICD-10-CM | POA: Diagnosis not present

## 2015-11-25 DIAGNOSIS — Z7982 Long term (current) use of aspirin: Secondary | ICD-10-CM | POA: Insufficient documentation

## 2015-11-25 DIAGNOSIS — I25119 Atherosclerotic heart disease of native coronary artery with unspecified angina pectoris: Secondary | ICD-10-CM

## 2015-11-25 HISTORY — PX: CARDIAC CATHETERIZATION: SHX172

## 2015-11-25 SURGERY — LEFT HEART CATH AND CORONARY ANGIOGRAPHY
Anesthesia: LOCAL

## 2015-11-25 MED ORDER — HEPARIN (PORCINE) IN NACL 2-0.9 UNIT/ML-% IJ SOLN
INTRAMUSCULAR | Status: AC
  Filled 2015-11-25: qty 1000

## 2015-11-25 MED ORDER — SODIUM CHLORIDE 0.9 % WEIGHT BASED INFUSION: 1.0000 mL/kg/h | INTRAVENOUS | Status: DC

## 2015-11-25 MED ORDER — SODIUM CHLORIDE 0.9% FLUSH: 3.0000 mL | INTRAVENOUS | Status: DC | PRN

## 2015-11-25 MED ORDER — MIDAZOLAM HCL 2 MG/2ML IJ SOLN
INTRAMUSCULAR | Status: AC
  Filled 2015-11-25: qty 2

## 2015-11-25 MED ORDER — HEPARIN (PORCINE) IN NACL 2-0.9 UNIT/ML-% IJ SOLN
INTRAMUSCULAR | Status: DC | PRN
  Administered 2015-11-25: 10:00:00

## 2015-11-25 MED ORDER — IOPAMIDOL (ISOVUE-370) INJECTION 76%
INTRAVENOUS | Status: AC
  Filled 2015-11-25: qty 100

## 2015-11-25 MED ORDER — FENTANYL CITRATE (PF) 100 MCG/2ML IJ SOLN
INTRAMUSCULAR | Status: AC
  Filled 2015-11-25: qty 2

## 2015-11-25 MED ORDER — IOPAMIDOL (ISOVUE-370) INJECTION 76%
INTRAVENOUS | Status: DC | PRN
  Administered 2015-11-25: 70 mL

## 2015-11-25 MED ORDER — HEPARIN SODIUM (PORCINE) 1000 UNIT/ML IJ SOLN
INTRAMUSCULAR | Status: AC
  Filled 2015-11-25: qty 1

## 2015-11-25 MED ORDER — LIDOCAINE HCL (PF) 1 % IJ SOLN
INTRAMUSCULAR | Status: AC
  Filled 2015-11-25: qty 30

## 2015-11-25 MED ORDER — SODIUM CHLORIDE 0.9 % IV SOLN: 250.0000 mL | INTRAVENOUS | Status: DC | PRN

## 2015-11-25 MED ORDER — LIDOCAINE HCL (PF) 1 % IJ SOLN
INTRAMUSCULAR | Status: DC | PRN
  Administered 2015-11-25: 3 mL

## 2015-11-25 MED ORDER — SODIUM CHLORIDE 0.9 % WEIGHT BASED INFUSION: 3.0000 mL/kg/h | INTRAVENOUS | Status: AC

## 2015-11-25 MED ORDER — FENTANYL CITRATE (PF) 100 MCG/2ML IJ SOLN
INTRAMUSCULAR | Status: DC | PRN
  Administered 2015-11-25: 25 ug via INTRAVENOUS

## 2015-11-25 MED ORDER — SODIUM CHLORIDE 0.9% FLUSH: 3.0000 mL | Freq: Two times a day (BID) | INTRAVENOUS | Status: DC

## 2015-11-25 MED ORDER — HEPARIN SODIUM (PORCINE) 1000 UNIT/ML IJ SOLN
INTRAMUSCULAR | Status: DC | PRN
  Administered 2015-11-25: 6000 [IU] via INTRAVENOUS

## 2015-11-25 MED ORDER — ACETAMINOPHEN 325 MG PO TABS: 650.0000 mg | ORAL_TABLET | ORAL | Status: DC | PRN

## 2015-11-25 MED ORDER — MIDAZOLAM HCL 2 MG/2ML IJ SOLN
INTRAMUSCULAR | Status: DC | PRN
  Administered 2015-11-25: 1 mg via INTRAVENOUS

## 2015-11-25 MED ORDER — ASPIRIN 81 MG PO CHEW: 81.0000 mg | CHEWABLE_TABLET | ORAL | Status: DC

## 2015-11-25 MED ORDER — VERAPAMIL HCL 2.5 MG/ML IV SOLN
INTRAVENOUS | Status: AC
  Filled 2015-11-25: qty 2

## 2015-11-25 MED ORDER — VERAPAMIL HCL 2.5 MG/ML IV SOLN
INTRAVENOUS | Status: DC | PRN
  Administered 2015-11-25: 10 mL via INTRA_ARTERIAL

## 2015-11-25 MED ORDER — ONDANSETRON HCL 4 MG/2ML IJ SOLN: 4.0000 mg | Freq: Four times a day (QID) | INTRAMUSCULAR | Status: DC | PRN

## 2015-11-25 SURGICAL SUPPLY — 9 items
CATH INFINITI 5FR ANG PIGTAIL (CATHETERS) ×1 IMPLANT
CATH OPTITORQUE TIG 4.0 5F (CATHETERS) ×1 IMPLANT
DEVICE RAD COMP TR BAND LRG (VASCULAR PRODUCTS) ×1 IMPLANT
GLIDESHEATH SLEND A-KIT 6F 22G (SHEATH) ×1 IMPLANT
KIT HEART LEFT (KITS) ×2 IMPLANT
PACK CARDIAC CATHETERIZATION (CUSTOM PROCEDURE TRAY) ×2 IMPLANT
TRANSDUCER W/STOPCOCK (MISCELLANEOUS) ×2 IMPLANT
TUBING CIL FLEX 10 FLL-RA (TUBING) ×2 IMPLANT
WIRE SAFE-T 1.5MM-J .035X260CM (WIRE) ×1 IMPLANT

## 2015-11-25 NOTE — H&P (View-Only) (Signed)
Cardiology Office Note    Date:  11/22/2015  ID:  Bryan Hatfield, DOB 08-27-1926, MRN [id number] PCP:  Beatrice Lecher, MD  Cardiologist:  Dr. [name]   Chief Complaint: f/u dyspnea  History of Present Illness:  Bryan Hatfield is a 82 y.o. male with history of CAD (s/p PTCA 2001), HTN, HLD, COPD, OSA on CPAP, parapneumonic pleural effusion r/t PNA s/p thoracentesis 2008 who presents for f/u. Last cath 05/2012 showed heavy 3-vessel calcification, 40% p-mLAD, 50% OM1, widely patent RCA, normal LVEDP and normal PA pressures. He was seen by [name] PA-C on 11/01/15 for chest pain and dyspnea. He had recently gotten over bronchitis. Nuclear stress test was normal, no evidence of ischemia, EF 65%. CTA 11/11/15 showed no evidence of PE, stable pulm nodule consistent with benign etiology. Labs notable for normal Cr 0.97, Hgb 12.5, BNP wnl.  He comes in today for follow-up. He remains concerned that he continues to have significant dyspnea and diaphoresis with exertion. He used to go to the [facility] 3x/week but stopped when he became sick last month. He now finds it hard to go to the mailbox without getting SOB and profusely sweaty. He and his wife are concerned because this is what he felt prior to his PTCA in 2001. He has not really had further chest pain. No syncope.    Past Medical History  Diagnosis Date  . CAD (coronary artery disease)     a. PTCA 2001. b. f/u cath 05/2012 showed heavy 3-vessel calcification, 40% p-mLAD, 50% OM1, widely patent RCA, normal LVEDP and normal PA pressures  . Hypertension   . Acute respiratory failure ([place]) 09/25/11    hypoxic  . Peripheral vascular disease ([place])     patient denies knowledge of, but listed in his chart  . High cholesterol   . Anxiety   . Pleural effusion     Parapneumonic ?r/t PNA s/p thoracentesis 2008  . GERD (gastroesophageal reflux disease)   . Colon polyps   . COPD (chronic obstructive pulmonary disease) ([place])   . Pneumonia     hx  . Sleep apnea      cpap     Past Surgical History  Procedure Laterality Date  . Hemorrhoid surgery  1960's  . Coronary angioplasty  01/2000  . Cataract extraction w/ intraocular lens  implant, bilateral  ~ 2006  . Cholecystectomy N/A 10/11/2012    Procedure: LAPAROSCOPIC CHOLECYSTECTOMY WITH INTRAOPERATIVE CHOLANGIOGRAM;  Surgeon: Adin Hector, MD;  Location: [facility];  Service: General;  Laterality: N/A;    Current Medications: Current Outpatient Prescriptions  Medication Sig Dispense Refill  . albuterol (PROVENTIL) (2.5 MG/3ML) 0.083% nebulizer solution Take 2.5 mg by nebulization as directed. Breathing treatment    . Ascorbic Acid (VITAMIN C PO) Take 1 tablet by mouth daily.    Marland Kitchen aspirin EC 81 MG tablet Take 81 mg by mouth daily.      Marland Kitchen atorvastatin (LIPITOR) 40 MG tablet Take 1 tablet (40 mg total) by mouth at bedtime. 90 tablet 3  . budesonide-formoterol (SYMBICORT) 80-4.5 MCG/ACT inhaler Inhale 2 puffs into the lungs at bedtime. FAX:[phone] 3 Inhaler prn  . escitalopram (LEXAPRO) 10 MG tablet Take 1 tablet (10 mg total) by mouth daily. 90 tablet 1  . furosemide (LASIX) 40 MG tablet TAKE 1 TABLET BY MOUTH  EVERY OTHER DAY USUALLY ON  MONDAY,WEDNESDAY,FRIDAY 45 tablet 0  . imiquimod (ALDARA) 5 % cream Apply topically as directed. Apply at bedtime    . levothyroxine (  SYNTHROID, LEVOTHROID) 112 MCG tablet TAKE 1 TABLET(112 MCG) BY MOUTH DAILY BEFORE BREAKFAST 90 tablet 3  . Multiple Vitamins-Minerals (ICAPS MV PO) Take 1 capsule by mouth daily.    . Nebulizers (COMPRESSOR/NEBULIZER) MISC by Does not apply route.    . nitroGLYCERIN (NITROSTAT) 0.4 MG SL tablet Place 0.4 mg under the tongue every 5 (five) minutes as needed for chest pain (x 3 doses).    . triamcinolone cream (KENALOG) 0.1 % as directed. Apply to affected area(s)  Daily as needed for up to 2 weeks. Avoid face.     No current facility-administered medications for this visit.     Allergies:   Review of patient's allergies  indicates no known allergies.   Social History   Social History  . Marital Status: Married    Spouse Name: N/A  . Number of Children: N/A  . Years of Education: N/A   Occupational History  . Retired     Surveyor, quantity - Civil engineer, contracting and [employer]   Social History Main Topics  . Smoking status: Former Smoker -- 1.00 packs/day for 10 years    Types: Cigarettes    Quit date: 06/01/1982  . Smokeless tobacco: Never Used     Comment: "quit smoking ~ 25 years ago; smoked ~ 1 pk/wk"  . Alcohol Use: No  . Drug Use: No  . Sexual Activity: No   Other Topics Concern  . None   Social History Narrative     Family History:  The patient's family history includes Arthritis in his mother and sister; Asthma in his mother; Emphysema in his mother; Stroke in his father, mother, and sister. There is no history of Heart attack.   ROS:   Please see the history of present illness.  All other systems are reviewed and otherwise negative.    PHYSICAL EXAM:   VS:  BP 122/72 mmHg  Pulse 68  Ht 5\' 11"  (1.803 m)  Wt 271 lb 6.4 oz (123.106 kg)  BMI 37.87 kg/m2  SpO2 97%  BMI: Body mass index is 37.87 kg/(m^2). GEN: Well nourished, well developed WM, in no acute distress, kyphotic posture HEENT: normocephalic, atraumatic Neck: no JVD, carotid bruits, or masses Cardiac: RRR; no murmurs, rubs, or gallops, no edema  Respiratory:  clear to auscultation bilaterally, normal work of breathing GI: soft, nontender, nondistended, + BS MS: no deformity or atrophy Skin: warm and dry, no rash Neuro:  Alert and Oriented x 3, Strength and sensation are intact, follows commands Psych: euthymic mood, full affect  Wt Readings from Last 3 Encounters:  11/22/15 271 lb 6.4 oz (123.106 kg)  11/05/15 264 lb (119.75 kg)  11/01/15 264 lb 6.4 oz (119.931 kg)      Studies/Labs Reviewed:   EKG:  EKG was not ordered today. EKG was reviewed from 11/01/15 - NSR, poor R wave progression, TWI III  Recent Labs: 05/29/2015: Pro B  Natriuretic peptide (BNP) 133.0* 09/06/2015: TSH 1.75 11/01/2015: Brain Natriuretic Peptide 34.5; BUN 21; Creat 0.97; Hemoglobin 12.5*; Platelets 200; Potassium 4.3; Sodium 139   Lipid Panel    Component Value Date/Time   CHOL 145 09/05/2014 0837   TRIG 84 09/05/2014 0837   HDL 59 09/05/2014 0837   CHOLHDL 2.5 09/05/2014 0837   VLDL 17 09/05/2014 0837   LDLCALC 69 09/05/2014 0837    Additional studies/ records that were reviewed today include: Summarized above.    ASSESSMENT & PLAN:   1. SOB and diaphoresis - recent nuc, CTA, and labwork (including BNP) unrevealing.  I do not hear any significant valvular disease on exam. The patient and his wife are concerned about the possibility of a false negative nuclear stress test. They are seeking definitive cardiac evaluation. Given his symptoms as well as the heavy calcification seen in 2013, I think it is reasonable to proceed with LHC. I d/w Dr. Angelena Form (DOD) who agrees. Risks and benefits of cardiac catheterization have been discussed with the patient.  These include bleeding, infection, kidney damage, stroke, heart attack, death. The patient understands these risks and is willing to proceed. Will get pre-cath labs today. Will add Imdur 30mg  daily. If his LHC is unrevealing, I have discussed with him the need for further evaluation with pulmonology. Suspect a component of deconditioning ever since his illness in May. 2. CAD - continue ASA, statin. He is not on BB presumably due to baseline HR 60s/COPD. Will not start at this time. Add Imdur as above to see if it helps with his sx.  3. HTN - controlled. Follow with addition of Imdur. 4. COPD - has f/u with pulm. Only 5 year history of smoking, but reports prior asbestos exposure.  Disposition: F/u with 1 month with me or [name] PA-C.   Medication Adjustments/Labs and Tests Ordered: Current medicines are reviewed at length with the patient today.  Concerns regarding medicines are outlined  above. Medication changes, Labs and Tests ordered today are summarized above and listed in the Patient Instructions accessible in Encounters.   Raechel Ache PA-C  11/22/2015 2:20 PM    [facility] [place], [place], [place]  [address] Phone: [phone]; Fax: [phone]

## 2015-11-25 NOTE — Telephone Encounter (Signed)
Called pt to let him know that is cath was good, other then LVEDP was elevated, suggesting some fluid retention.  Per Melina Copa, PA-C, pt was advised to limit salt intake and limit fluid intake to no more than 64oz a day, and to f/u with Pulmonology. Pt and wife agreeable with this plan.

## 2015-11-25 NOTE — Telephone Encounter (Signed)
Test. Please disregard message.

## 2015-11-25 NOTE — Telephone Encounter (Signed)
Follow Up: ° ° ° ° °Returning your call from earlier today. °

## 2015-11-25 NOTE — Telephone Encounter (Signed)
Patient had cath today. Dr. Ellyn Hack sent me the chart. His LVEDP was slightly elevated suggesting mild fluid retention but his coronaries were generally unchanged ->  Dr. Ellyn Hack suspects primarily non-cardiac cause. He raised suggestion of low-dose diuretic. Bryan Hatfield is already on Lasix MWF - would continue for now, limit salt, and keep fluid intake to less than 64oz per day. The patient should f/u with pulmonology like we talked about at his visit since abnormal lung function could be the primary cause for his symptoms.  Dayna Dunn PA-C

## 2015-11-25 NOTE — Interval H&P Note (Signed)
History and Physical Interval Note:  11/25/2015 7:35 AM  Bryan Hatfield  has presented today for surgery, with the diagnosis of sob as anginal equivalent.   The various methods of treatment have been discussed with the patient and family. After consideration of risks, benefits and other options for treatment, the patient has consented to  Procedure(s): Left Heart Cath and Coronary Angiography (N/A) With Possible Percutaneous Coronary Intervention as a surgical intervention .  The patient's history has been reviewed, patient examined, no change in status, stable for surgery.  I have reviewed the patient's chart and labs.  Questions were answered to the patient's satisfaction.    AUC for cath: CAD Assessment (Coronary Angiography With or Without Left Heart Catheterization and/or Left Ventriculography)  Patient Information:   Suspected CAD (No Prior PCI, No Prior CABG, and No Prior Angiogram Showing >= 50% Angiographic Stenosis)   Prior Noninvasive Testing: Stress Test With Imaging (SPECT MPI, Stress Echocardiography, Stress PET, Stress CMR)   Low-risk findings (e.g., 5% ischemic myocardium on stress SPECT MPI or stress PET, no stress-induced wall motion abnormalities on stress echo or stress CMR)   Pretest Symptom Status: Symptomatic  AUC Score:   U (4)   Indication:   15 CAD Assessment (Coronary Angiography With or Without Left Heart Catheterization and/or Left Ventriculography)  Patient Information:    Suspected CAD (No Prior PCI, No Prior CABG, and No Prior Angiogram Showing >= 50% Angiographic Stenosis)   Prior Noninvasive Testing: Stress Test With Imaging (SPECT MPI, Stress Echocardiography, Stress PET, Stress CMR)   Discordant findings (e.g., low-risk prior imaging with ongoing symptoms consistent with ischemic equivalent)   Pretest Symptom Status: Symptomatic     AUC Score:   A (7)   Indication:   19  Consider PCI based on diagnostic angiography   Glenetta Hew

## 2015-11-25 NOTE — Discharge Instructions (Signed)
Radial Site Care °Refer to this sheet in the next few weeks. These instructions provide you with information about caring for yourself after your procedure. Your health care provider may also give you more specific instructions. Your treatment has been planned according to current medical practices, but problems sometimes occur. Call your health care provider if you have any problems or questions after your procedure. °WHAT TO EXPECT AFTER THE PROCEDURE °After your procedure, it is typical to have the following: °· Bruising at the radial site that usually fades within 1-2 weeks. °· Blood collecting in the tissue (hematoma) that may be painful to the touch. It should usually decrease in size and tenderness within 1-2 weeks. °HOME CARE INSTRUCTIONS °· Take medicines only as directed by your health care provider. °· You may shower 24-48 hours after the procedure or as directed by your health care provider. Remove the bandage (dressing) and gently wash the site with plain soap and water. Pat the area dry with a clean towel. Do not rub the site, because this may cause bleeding. °· Do not take baths, swim, or use a hot tub until your health care provider approves. °· Check your insertion site every day for redness, swelling, or drainage. °· Do not apply powder or lotion to the site. °· Do not flex or bend the affected arm for 24 hours or as directed by your health care provider. °· Do not push or pull heavy objects with the affected arm for 24 hours or as directed by your health care provider. °· Do not lift over 10 lb (4.5 kg) for 5 days after your procedure or as directed by your health care provider. °· Ask your health care provider when it is okay to: °¨ Return to work or school. °¨ Resume usual physical activities or sports. °¨ Resume sexual activity. °· Do not drive home if you are discharged the same day as the procedure. Have someone else drive you. °· You may drive 24 hours after the procedure unless otherwise  instructed by your health care provider. °· Do not operate machinery or power tools for 24 hours after the procedure. °· If your procedure was done as an outpatient procedure, which means that you went home the same day as your procedure, a responsible adult should be with you for the first 24 hours after you arrive home. °· Keep all follow-up visits as directed by your health care provider. This is important. °SEEK MEDICAL CARE IF: °· You have a fever. °· You have chills. °· You have increased bleeding from the radial site. Hold pressure on the site. °SEEK IMMEDIATE MEDICAL CARE IF: °· You have unusual pain at the radial site. °· You have redness, warmth, or swelling at the radial site. °· You have drainage (other than a small amount of blood on the dressing) from the radial site. °· The radial site is bleeding, and the bleeding does not stop after 30 minutes of holding steady pressure on the site. °· Your arm or hand becomes pale, cool, tingly, or numb. °  °This information is not intended to replace advice given to you by your health care provider. Make sure you discuss any questions you have with your health care provider. °  °Document Released: 06/20/2010 Document Revised: 06/08/2014 Document Reviewed: 12/04/2013 °Elsevier Interactive Patient Education ©2016 [employer] ° °

## 2015-12-06 DIAGNOSIS — J40 Bronchitis, not specified as acute or chronic: Secondary | ICD-10-CM | POA: Diagnosis not present

## 2015-12-24 ENCOUNTER — Encounter: Payer: Self-pay | Admitting: Physician Assistant

## 2015-12-24 ENCOUNTER — Ambulatory Visit (INDEPENDENT_AMBULATORY_CARE_PROVIDER_SITE_OTHER): Payer: Medicare Other | Admitting: Physician Assistant

## 2015-12-24 VITALS — BP 124/70 | HR 68 | Ht 71.0 in | Wt 265.4 lb

## 2015-12-24 DIAGNOSIS — I10 Essential (primary) hypertension: Secondary | ICD-10-CM | POA: Diagnosis not present

## 2015-12-24 DIAGNOSIS — I2510 Atherosclerotic heart disease of native coronary artery without angina pectoris: Secondary | ICD-10-CM

## 2015-12-24 DIAGNOSIS — R0600 Dyspnea, unspecified: Secondary | ICD-10-CM

## 2015-12-24 DIAGNOSIS — R9430 Abnormal result of cardiovascular function study, unspecified: Secondary | ICD-10-CM | POA: Diagnosis not present

## 2015-12-24 DIAGNOSIS — J449 Chronic obstructive pulmonary disease, unspecified: Secondary | ICD-10-CM

## 2015-12-24 MED ORDER — ISOSORBIDE MONONITRATE ER 30 MG PO TB24: 30.0000 mg | ORAL_TABLET | Freq: Every day | ORAL | 3 refills | Status: DC

## 2015-12-24 NOTE — Progress Notes (Signed)
Cardiology Office Note    Date:  12/24/2015  ID:  Bryan Hatfield, DOB 03/01/1927, MRN [id number] PCP:  Bryan Lecher, MD  Cardiologist:  Dr. Tamala Hatfield  Chief Complaint: f/u CP/SOB  History of Present Illness:  Bryan Hatfield is a 82 y.o. male with history of CAD (s/p PTCA 2001), HTN, HLD, COPD, OSA on CPAP, parapneumonic pleural effusion r/t PNA s/p thoracentesis 2008, h/o asbestos exposure, remote tobacco abuse (only 5 year history of smoking) who presents for f/u.  He was seen by Bryan Deforest PA-C on 11/01/15 for chest pain and dyspnea. He had recently gotten over bronchitis. Nuclear stress test was normal, no evidence of ischemia, EF 65%. CTA 11/11/15 showed no evidence of PE, stable pulm nodule consistent with benign etiology. Labs notable for normal Cr 0.97, Hgb 12.5, BNP wnl. I saw him in follow-up 11/22/15 at which time he was continuing to have significant dspnea and diaphoresis, similar to prior angina. We added Imdur. Given continued sx, we proceeded with heart cath for definitive eval. 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 LVEF was normal but LVEDP was [id number], likely diastolic dysfunction. Dr. Ellyn Hatfield recommended low dose diuretic - we continued his Lasix MWF and recommended to limit salt/fluid intake. Pulm referral was recommended. Last labs 11/22/15: K 4.1, Cr 0.90, Hgb 12.4.  He returns for follow-up doing well. He saw improvement in his symptoms with the Imdur and inquires about refills sent to his mail order. No further chest discomfort. His SOB has improved quite a bit - still present to a degree, so he is seeing pulm as recommended (at the [facility]) soon.   Past Medical History:  Diagnosis Date  . Acute respiratory failure ([place]) 09/25/11   hypoxic  . Anxiety   . CAD (coronary artery disease)     a. PTCA 2001. b. f/u cath 05/2012 heavy calcification, no PCI required. c. f/u cath for recurrent sx 2017: stable mod CAD unchanged from prior, mild progression of small vessel disease, elevated LVEDP.  . Colon polyps   . COPD (chronic obstructive pulmonary disease) ([place])   . GERD (gastroesophageal reflux disease)   . High cholesterol   . Hypertension   . Peripheral vascular disease ([place])    patient denies knowledge of, but listed in his chart  . Pleural effusion    Parapneumonic ?r/t PNA s/p thoracentesis 2008  . Pneumonia    hx  . Sleep apnea    cpap     Past Surgical History:  Procedure Laterality Date  . CARDIAC CATHETERIZATION N/A 11/25/2015   Procedure: Left Heart Cath and Coronary Angiography;  Surgeon: Bryan Man, MD;  Location: [facility];  Service: Cardiovascular;  Laterality: N/A;  . CATARACT EXTRACTION W/ INTRAOCULAR LENS  IMPLANT, BILATERAL  ~ 2006  . CHOLECYSTECTOMY N/A 10/11/2012   Procedure: LAPAROSCOPIC CHOLECYSTECTOMY WITH INTRAOPERATIVE CHOLANGIOGRAM;  Surgeon: Bryan Hector, MD;  Location: [facility];  Service: General;  Laterality: N/A;  . CORONARY ANGIOPLASTY  01/2000  . HEMORRHOID SURGERY  1960's    Current Medications: Current Outpatient Prescriptions  Medication Sig Dispense Refill  . albuterol (PROVENTIL) (2.5 MG/3ML) 0.083% nebulizer solution Take 2.5 mg by nebulization every 6 (six) hours as needed for wheezing. Breathing treatment    . Ascorbic Acid (VITAMIN C PO) Take 1 tablet by mouth  daily.    . aspirin EC 81 MG tablet Take 81 mg by mouth daily.      Marland Kitchen atorvastatin (LIPITOR) 40 MG tablet Take 1 tablet (40 mg total) by mouth at bedtime. 90 tablet 3  . budesonide-formoterol (SYMBICORT) 160-4.5 MCG/ACT inhaler Inhale 2 puffs into the lungs 2 (two) times daily.    Marland Kitchen escitalopram (LEXAPRO) 10 MG tablet Take 1 tablet (10 mg total) by mouth daily. 90 tablet 1  . furosemide (LASIX) 40 MG tablet TAKE 1 TABLET BY MOUTH  EVERY OTHER DAY USUALLY ON   MONDAY,WEDNESDAY,FRIDAY 45 tablet 0  . imiquimod (ALDARA) 5 % cream Apply 1 application topically daily as needed (rash).     . isosorbide mononitrate (IMDUR) 30 MG 24 hr tablet TAKE 1 TABLET(30 MG) BY MOUTH DAILY 90 tablet 0  . levothyroxine (SYNTHROID, LEVOTHROID) 112 MCG tablet TAKE 1 TABLET(112 MCG) BY MOUTH DAILY BEFORE BREAKFAST 90 tablet 3  . Multiple Vitamins-Minerals (ICAPS MV PO) Take 1 capsule by mouth daily.    . Nebulizers (COMPRESSOR/NEBULIZER) MISC by Does not apply route.    . nitroGLYCERIN (NITROSTAT) 0.4 MG SL tablet Place 0.4 mg under the tongue every 5 (five) minutes as needed for chest pain (x 3 doses).    . triamcinolone cream (KENALOG) 0.1 % Apply 1 application topically daily as needed (rash).      No current facility-administered medications for this visit.      Allergies:   Review of patient's allergies indicates no known allergies.   Social History   Social History  . Marital status: Married    Spouse name: N/A  . Number of children: N/A  . Years of education: N/A   Occupational History  . Retired     Surveyor, quantity - Civil engineer, [employer] and [employer]   Social History Main Topics  . Smoking status: Former Smoker    Packs/day: 1.00    Years: 10.00    Types: Cigarettes    Quit date: 06/01/1982  . Smokeless tobacco: Never Used     Comment: "quit smoking ~ 25 years ago; smoked ~ 1 pk/wk"  . Alcohol use No  . Drug use: No  . Sexual activity: No   Other Topics Concern  . None   Social History Narrative  . None     Family History:  The patient's family history includes Arthritis in his mother and sister; Asthma in his mother; Emphysema in his mother; Stroke in his father, mother, and sister. \  ROS:   Please see the history of present illness. \ All other systems are reviewed and otherwise negative.    PHYSICAL EXAM:   VS:  BP 124/70 (BP Location: Right Arm)   Pulse 68   Ht 5\' 11"  (1.803 m)   Wt 265 lb 6.4 oz (120.4 kg)   SpO2 94%   BMI 37.02 kg/m   BMI: Body  mass index is 37.02 kg/m. GEN: Well nourished, well developed WM in no acute distress  HEENT: normocephalic, atraumatic Neck: no JVD, carotid bruits, or masses Cardiac: RRR; no murmurs, rubs, or gallops, no edema  Respiratory:  clear to auscultation bilaterally, normal work of breathing GI: soft, nontender, nondistended, + BS MS: no deformity or atrophy  Skin: warm and dry, no rash. Right radial cath site without hematoma or ecchymosis; good pulse. Neuro:  Alert and Oriented x 3, Strength and sensation are intact, follows commands Psych: euthymic mood, full affect  Wt Readings from Last 3 Encounters:  12/24/15 265 lb 6.4 oz (120.4  kg)  11/25/15 264 lb (119.7 kg)  11/22/15 271 lb 6.4 oz (123.1 kg)      Studies/Labs Reviewed:   EKG:  EKG was not ordered today.  Recent Labs: 05/29/2015: Pro B Natriuretic peptide (BNP) 133.0 09/06/2015: TSH 1.75 11/01/2015: Brain Natriuretic Peptide 34.5 11/22/2015: ALT 25; BUN 17; Creat 0.90; Hemoglobin 12.4; Platelets 192; Potassium 4.1; Sodium 140   Lipid Panel    Component Value Date/Time   CHOL 145 09/05/2014 0837   TRIG 84 09/05/2014 0837   HDL 59 09/05/2014 0837   CHOLHDL 2.5 09/05/2014 0837   VLDL 17 09/05/2014 0837   LDLCALC 69 09/05/2014 0837    Additional studies/ records that were reviewed today include: Summarized above.    ASSESSMENT & PLAN:   1. Dyspnea - improved s/p addition of Imdur. Cath stable as above. Keep f/u with pulm. 2. Elevated LVEDP - continue maintenance diuretic. Labs stable recently. 3. CAD - stable as above. Cath site without complication. Continue ASA, statin, Imdur. 4. Essential HTN - BP well controlled. Continue to monitor. 5. COPD - has f/u to establish care at [facility] for pulm coming up soon.  Disposition: F/u with Dr. Tamala Hatfield in 6-8 months.   Medication Adjustments/Labs and Tests Ordered: Current medicines are reviewed at length with the patient today.  Concerns regarding medicines are outlined above.  Medication changes, Labs and Tests ordered today are summarized above and listed in the Patient Instructions accessible in Encounters.   Raechel Ache PA-C  12/24/2015 2:42 PM    [facility] [place], [place], [place]  [address] Phone: [phone]; Fax: [phone]

## 2015-12-24 NOTE — Patient Instructions (Addendum)
Medication Instructions:  Your physician recommends that you continue on your current medications as directed. Please refer to the Current Medication list given to you today.   Labwork: None ordered  Testing/Procedures: None ordered  Follow-Up: Your physician wants you to follow-up in: 6 MONTHS WITH DR. SMITH You will receive a reminder letter in the mail two months in advance. If you don't receive a letter, please call our office to schedule the follow-up appointment.   Any Other Special Instructions Will Be Listed Below (If Applicable).     If you need a refill on your cardiac medications before your next appointment, please call your pharmacy.   

## 2016-01-06 DIAGNOSIS — J40 Bronchitis, not specified as acute or chronic: Secondary | ICD-10-CM | POA: Diagnosis not present

## 2016-01-21 ENCOUNTER — Other Ambulatory Visit: Payer: Self-pay | Admitting: *Deleted

## 2016-01-21 MED ORDER — FUROSEMIDE 40 MG PO TABS: ORAL_TABLET | ORAL | 1 refills | Status: DC

## 2016-01-22 ENCOUNTER — Telehealth: Payer: Self-pay | Admitting: Family Medicine

## 2016-01-22 NOTE — Telephone Encounter (Signed)
lvm for pt expressing our condolences and advised him of Dr. Gardiner Ramus recommendations and asked that he return the call to our office.Bryan Hatfield [place]

## 2016-01-22 NOTE — Telephone Encounter (Signed)
Pt's wife called and stated that Bryan Hatfield's son just passed away and was wanting to know if there was anything you can give him for his nerves. Thanks

## 2016-01-22 NOTE — Telephone Encounter (Signed)
Call pt: and see how he is doing. We can offer greiving counseling.  Is he having sleep issues?

## 2016-01-27 ENCOUNTER — Other Ambulatory Visit: Payer: Self-pay | Admitting: Physician Assistant

## 2016-01-28 ENCOUNTER — Ambulatory Visit: Payer: Medicare Other | Admitting: Family Medicine

## 2016-02-04 ENCOUNTER — Ambulatory Visit: Payer: Medicare Other | Admitting: Family Medicine

## 2016-02-06 DIAGNOSIS — J40 Bronchitis, not specified as acute or chronic: Secondary | ICD-10-CM | POA: Diagnosis not present

## 2016-03-07 DIAGNOSIS — J40 Bronchitis, not specified as acute or chronic: Secondary | ICD-10-CM | POA: Diagnosis not present

## 2016-03-09 ENCOUNTER — Other Ambulatory Visit: Payer: Self-pay | Admitting: Family Medicine

## 2016-03-13 ENCOUNTER — Encounter (HOSPITAL_BASED_OUTPATIENT_CLINIC_OR_DEPARTMENT_OTHER): Payer: Self-pay | Admitting: Emergency Medicine

## 2016-03-13 ENCOUNTER — Emergency Department (HOSPITAL_BASED_OUTPATIENT_CLINIC_OR_DEPARTMENT_OTHER)
Admission: EM | Admit: 2016-03-13 | Discharge: 2016-03-13 | Disposition: A | Discharge status: Home / Self Care | Payer: Medicare Other | Attending: Emergency Medicine | Admitting: Emergency Medicine

## 2016-03-13 ENCOUNTER — Emergency Department (HOSPITAL_BASED_OUTPATIENT_CLINIC_OR_DEPARTMENT_OTHER): Payer: Medicare Other

## 2016-03-13 DIAGNOSIS — Z87891 Personal history of nicotine dependence: Secondary | ICD-10-CM | POA: Insufficient documentation

## 2016-03-13 DIAGNOSIS — R109 Unspecified abdominal pain: Secondary | ICD-10-CM | POA: Diagnosis not present

## 2016-03-13 DIAGNOSIS — I10 Essential (primary) hypertension: Secondary | ICD-10-CM | POA: Diagnosis not present

## 2016-03-13 DIAGNOSIS — Z7982 Long term (current) use of aspirin: Secondary | ICD-10-CM | POA: Diagnosis not present

## 2016-03-13 DIAGNOSIS — J449 Chronic obstructive pulmonary disease, unspecified: Secondary | ICD-10-CM | POA: Diagnosis not present

## 2016-03-13 DIAGNOSIS — R197 Diarrhea, unspecified: Secondary | ICD-10-CM | POA: Diagnosis not present

## 2016-03-13 DIAGNOSIS — E039 Hypothyroidism, unspecified: Secondary | ICD-10-CM | POA: Diagnosis not present

## 2016-03-13 DIAGNOSIS — R112 Nausea with vomiting, unspecified: Secondary | ICD-10-CM | POA: Diagnosis not present

## 2016-03-13 DIAGNOSIS — I2510 Atherosclerotic heart disease of native coronary artery without angina pectoris: Secondary | ICD-10-CM | POA: Diagnosis not present

## 2016-03-13 DIAGNOSIS — R05 Cough: Secondary | ICD-10-CM | POA: Insufficient documentation

## 2016-03-13 LAB — CBC WITH DIFFERENTIAL/PLATELET
BASOS ABS: 0 10*3/uL (ref 0.0–0.1)
BASOS PCT: 0 %
Eosinophils Absolute: 0.2 10*3/uL (ref 0.0–0.7)
Eosinophils Relative: 3 %
HEMATOCRIT: 40.1 % (ref 39.0–52.0)
Hemoglobin: 13.5 g/dL (ref 13.0–17.0)
LYMPHS PCT: 28 %
Lymphs Abs: 1.4 10*3/uL (ref 0.7–4.0)
MCH: 31.5 pg (ref 26.0–34.0)
MCHC: 33.7 g/dL (ref 30.0–36.0)
MCV: 93.5 fL (ref 78.0–100.0)
MONO ABS: 0.6 10*3/uL (ref 0.1–1.0)
Monocytes Relative: 12 %
NEUTROS ABS: 2.9 10*3/uL (ref 1.7–7.7)
NEUTROS PCT: 57 %
Platelets: 130 10*3/uL — ABNORMAL LOW (ref 150–400)
RBC: 4.29 MIL/uL (ref 4.22–5.81)
RDW: 13.7 % (ref 11.5–15.5)
WBC: 5 10*3/uL (ref 4.0–10.5)

## 2016-03-13 LAB — COMPREHENSIVE METABOLIC PANEL
ALBUMIN: 3.2 g/dL — AB (ref 3.5–5.0)
ALT: 30 U/L (ref 17–63)
AST: 31 U/L (ref 15–41)
Alkaline Phosphatase: 80 U/L (ref 38–126)
Anion gap: 7 (ref 5–15)
BILIRUBIN TOTAL: 0.5 mg/dL (ref 0.3–1.2)
BUN: 16 mg/dL (ref 6–20)
CHLORIDE: 105 mmol/L (ref 101–111)
CO2: 25 mmol/L (ref 22–32)
CREATININE: 0.94 mg/dL (ref 0.61–1.24)
Calcium: 7.7 mg/dL — ABNORMAL LOW (ref 8.9–10.3)
GFR calc Af Amer: >60 mL/min (ref >60)
GFR calc non Af Amer: >60 mL/min (ref >60)
GLUCOSE: 129 mg/dL — AB (ref 65–99)
POTASSIUM: 3.2 mmol/L — AB (ref 3.5–5.1)
Sodium: 137 mmol/L (ref 135–145)
TOTAL PROTEIN: 6.2 g/dL — AB (ref 6.5–8.1)

## 2016-03-13 LAB — LIPASE, BLOOD: Lipase: 20 U/L (ref 11–51)

## 2016-03-13 MED ORDER — ONDANSETRON HCL 4 MG/2ML IJ SOLN
4.0000 mg | Freq: Once | INTRAMUSCULAR | Status: AC
Start: 2016-03-13 — End: 2016-03-13
  Administered 2016-03-13: 4 mg via INTRAVENOUS
  Filled 2016-03-13: qty 2

## 2016-03-13 MED ORDER — ONDANSETRON 4 MG PO TBDP: ORAL_TABLET | ORAL | 0 refills | Status: DC

## 2016-03-13 MED ORDER — MORPHINE SULFATE (PF) 2 MG/ML IV SOLN
2.0000 mg | Freq: Once | INTRAVENOUS | Status: AC
  Administered 2016-03-13: 2 mg via INTRAVENOUS
  Filled 2016-03-13: qty 1

## 2016-03-13 MED ORDER — SODIUM CHLORIDE 0.9 % IV BOLUS (SEPSIS)
1000.0000 mL | Freq: Once | INTRAVENOUS | Status: AC
  Administered 2016-03-13: 1000 mL via INTRAVENOUS

## 2016-03-13 MED ORDER — ACETAMINOPHEN 500 MG PO TABS
1000.0000 mg | ORAL_TABLET | Freq: Once | ORAL | Status: AC
  Administered 2016-03-13: 1000 mg via ORAL
  Filled 2016-03-13: qty 2

## 2016-03-13 NOTE — Discharge Instructions (Signed)
Take immodium as needed for diarrhea

## 2016-03-13 NOTE — ED Triage Notes (Signed)
Pt with flu-like illness. C/o cough, congestion, vomiting, diarrhea.

## 2016-03-13 NOTE — ED Provider Notes (Signed)
[facility] DE[facility] Provider Note   CSN: [id number] Arrival date & time: 03/13/16  2126   By signing my name below, I, Macon Large, attest that this documentation has been prepared under the direction and in the presence of Deno Etienne, DO. Electronically Signed: Macon Large, ED Scribe. 03/13/16. 10:45 PM.  History   Chief Complaint Chief Complaint  Patient presents with  . Emesis  . Cough   The history is provided by the patient. No language interpreter was used.  Emesis   This is a new problem. The current episode started 2 days ago. The problem occurs 2 to 4 times per day. The problem has been gradually improving. There has been no fever. Associated symptoms include abdominal pain, cough and diarrhea. Pertinent negatives include no arthralgias, no chills, no fever, no headaches and no myalgias.  Cough  Pertinent negatives include no chest pain, no chills, no headaches, no myalgias and no shortness of breath.    HPI Comments: Bryan Hatfield is a 82 y.o. male who presents to the Emergency Department complaining of moderate, multiple episodes of emesis onset 2 days ago. He reports associated abdominal pain, cough, decreased appetite and diarrhea. Pt states his abdominal pain is worsened with coughing. He notes being able to keep fluids down. He denies recent sick contact and traveling outside the country. No additional complaints at this time.   Past Medical History:  Diagnosis Date  . Acute respiratory failure ([place]) 09/25/11   hypoxic  . Anxiety   . CAD (coronary artery disease)    a. PTCA 2001. b. f/u cath 05/2012 heavy calcification, no PCI required. c. f/u cath for recurrent sx 2017: stable mod CAD unchanged from prior, mild progression of small vessel disease, elevated LVEDP.  . Colon polyps   . COPD (chronic obstructive pulmonary disease) ([place])   . GERD (gastroesophageal reflux disease)   . High cholesterol   . Hypertension   . Peripheral vascular disease ([place])      patient denies knowledge of, but listed in his chart  . Pleural effusion    Parapneumonic ?r/t PNA s/p thoracentesis 2008  . Pneumonia    hx  . Sleep apnea    cpap     Patient Active Problem List   Diagnosis Date Noted  . Dyspnea 05/29/2015  . Insomnia 02/19/2015  . Thyroid activity decreased 11/21/2014  . GAD (generalized anxiety disorder) 11/21/2014  . Erythrasma 09/18/2014  . Weakness 09/17/2014  . Arterial hypotension 09/17/2014  . Subclinical hypothyroidism 09/07/2014  . Episode of dizziness 08/30/2014  . Prediabetes 06/08/2013  . Essential hypertension, benign 06/02/2013  . Hyperlipidemia 06/02/2013  . Depression 06/02/2013  . CAD S/P percutaneous coronary angioplasty 05/09/2012  . Morbid obesity ([place]) 09/25/2011  . INSOMNIA UNSPECIFIED 09/21/2007  . COPD GOLD 0/I 09/21/2007  . Sleep apnea 09/20/2007    Past Surgical History:  Procedure Laterality Date  . CARDIAC CATHETERIZATION N/A 11/25/2015   Procedure: Left Heart Cath and Coronary Angiography;  Surgeon: Leonie Man, MD;  Location: [facility];  Service: Cardiovascular;  Laterality: N/A;  . CATARACT EXTRACTION W/ INTRAOCULAR LENS  IMPLANT, BILATERAL  ~ 2006  . CHOLECYSTECTOMY N/A 10/11/2012   Procedure: LAPAROSCOPIC CHOLECYSTECTOMY WITH INTRAOPERATIVE CHOLANGIOGRAM;  Surgeon: Adin Hector, MD;  Location: [facility];  Service: General;  Laterality: N/A;  . CORONARY ANGIOPLASTY  01/2000  . HEMORRHOID SURGERY  1960's       Home Medications    Prior to Admission medications   Medication Sig  Start Date End Date Taking? Authorizing Provider  albuterol (PROVENTIL) (2.5 MG/3ML) 0.083% nebulizer solution Take 2.5 mg by nebulization every 6 (six) hours as needed for wheezing. Breathing treatment 10/06/15   Historical Provider, MD  Ascorbic Acid (VITAMIN C PO) Take 1 tablet by mouth daily.    Historical Provider, MD  aspirin EC 81 MG tablet Take 81 mg by mouth daily.      Historical Provider, MD  atorvastatin  (LIPITOR) 40 MG tablet Take 1 tablet (40 mg total) by mouth at bedtime. 07/30/15   Agapito Games, MD  budesonide-formoterol [facility]) 160-4.5 MCG/ACT inhaler Inhale 2 puffs into the lungs 2 (two) times daily.    Historical Provider, MD  escitalopram (LEXAPRO) 10 MG tablet Take 1 tablet (10 mg total) by mouth daily. 09/05/15   Agapito Games, MD  furosemide (LASIX) 40 MG tablet TAKE 1 TABLET BY MOUTH  EVERY OTHER DAY USUALLY ON  MONDAY,WEDNESDAY,FRIDAY 01/21/16   Agapito Games, MD  imiquimod [facility]) 5 % cream Apply 1 application topically daily as needed (rash).     Historical Provider, MD  isosorbide mononitrate (IMDUR) 30 MG 24 hr tablet Take 1 tablet (30 mg total) by mouth daily. 12/24/15 03/23/16  Dayna N Dunn, PA-C  levothyroxine (SYNTHROID, LEVOTHROID) 112 MCG tablet TAKE 1 TABLET(112 MCG) BY MOUTH DAILY BEFORE BREAKFAST 06/26/15   Agapito Games, MD  Multiple Vitamins-Minerals (ICAPS MV PO) Take 1 capsule by mouth daily.    Historical Provider, MD  Nebulizers (COMPRESSOR/NEBULIZER) MISC by Does not apply route. 10/06/15   Historical Provider, MD  NITROSTAT 0.4 MG SL tablet DISSOLVE 1 TABLET UNDER THE TONGUE EVERY 5 MINUTES AS  NEEDED CHEST PAIN 03/10/16   Agapito Games, MD  ondansetron (ZOFRAN ODT) 4 MG disintegrating tablet 4mg  ODT q4 hours prn nausea/vomit 03/13/16   Melene Plan, DO  triamcinolone cream (KENALOG) 0.1 % Apply 1 application topically daily as needed (rash).     Historical Provider, MD    Family History Family History  Problem Relation Age of Onset  . Stroke Mother   . Arthritis Mother   . Asthma Mother   . Emphysema Mother   . Stroke Father   . Arthritis Sister   . Stroke Sister   . Heart attack Neg Hx     Social History Social History  Substance Use Topics  . Smoking status: Former Smoker    Packs/day: 1.00    Years: 10.00    Types: Cigarettes    Quit date: 06/01/1982  . Smokeless tobacco: Never Used     Comment: "quit smoking ~ 25  years ago; smoked ~ 1 pk/wk"  . Alcohol use No     Allergies   Review of patient's allergies indicates no known allergies.   Review of Systems Review of Systems  Constitutional: Positive for appetite change. Negative for chills and fever.  HENT: Negative for congestion and facial swelling.   Eyes: Negative for discharge and visual disturbance.  Respiratory: Positive for cough. Negative for shortness of breath.   Cardiovascular: Negative for chest pain and palpitations.  Gastrointestinal: Positive for abdominal pain, diarrhea and vomiting.  Musculoskeletal: Negative for arthralgias and myalgias.  Skin: Negative for color change and rash.  Neurological: Negative for tremors, syncope and headaches.  Psychiatric/Behavioral: Negative for confusion and dysphoric mood.     Physical Exam Updated Vital Signs BP 137/63 (BP Location: Left Arm)   Pulse 90   Temp 98.8 F (37.1 C) (Oral)   Resp 18  Ht 5\' 11"  (1.803 m)   Wt 269 lb (122 kg)   SpO2 96%   BMI 37.52 kg/m   Physical Exam  Constitutional: He is oriented to person, place, and time. He appears well-developed and well-nourished.  HENT:  Head: Normocephalic and atraumatic.   Mucous membranes moist.   Eyes: EOM are normal. Pupils are equal, round, and reactive to light.  Neck: Normal range of motion. Neck supple. No JVD present.  Cardiovascular: Normal rate and regular rhythm.  Exam reveals no gallop and no friction rub.   No murmur heard. Pulmonary/Chest: No respiratory distress. He has no wheezes.  Abdominal: He exhibits no distension. There is no tenderness. There is no rebound and no guarding.  No focal abdominal tenderness.  Musculoskeletal: Normal range of motion.  Neurological: He is alert and oriented to person, place, and time.  Skin: No rash noted. No pallor.  Psychiatric: He has a normal mood and affect. His behavior is normal.  Nursing note and vitals reviewed.    ED Treatments / Results   DIAGNOSTIC  STUDIES: Oxygen Saturation is 96% on RA, normal by my interpretation.    COORDINATION OF CARE: 9:45 PM Discussed treatment plan with pt at bedside which includes labs and Zofran and pt agreed to plan.   Labs (all labs ordered are listed, but only abnormal results are displayed) Labs Reviewed  CBC WITH DIFFERENTIAL/PLATELET - Abnormal; Notable for the following:       Result Value   Platelets 130 (*)    All other components within normal limits  COMPREHENSIVE METABOLIC PANEL - Abnormal; Notable for the following:    Potassium 3.2 (*)    Glucose, Bld 129 (*)    Calcium 7.7 (*)    Total Protein 6.2 (*)    Albumin 3.2 (*)    All other components within normal limits  LIPASE, BLOOD    EKG  EKG Interpretation None       Radiology Dg Chest 2 View  Result Date: 03/13/2016 CLINICAL DATA:  82 y/o M; cough, congestion, fever, vomiting, and diarrhea for 2 days. EXAM: CHEST  2 VIEW COMPARISON:  11/11/2015 CT chest.  03/27/2015 chest radiograph. FINDINGS: Stable borderline cardiomegaly. Clear lungs. No effusion or pneumothorax. Degenerative changes of the thoracic spine. IMPRESSION: No active cardiopulmonary disease.  Stable borderline cardiomegaly. Electronically Signed   By: Kristine Garbe M.D.   On: 03/13/2016 22:09    Procedures Procedures (including critical care time)  Medications Ordered in ED Medications  sodium chloride 0.9 % bolus 1,000 mL (1,000 mLs Intravenous New Bag/Given 03/13/16 2205)  ondansetron (ZOFRAN) injection 4 mg (4 mg Intravenous Given 03/13/16 2204)  acetaminophen (TYLENOL) tablet 1,000 mg (1,000 mg Oral Given 03/13/16 2204)  morphine 2 MG/ML injection 2 mg (2 mg Intravenous Given 03/13/16 2204)     Initial Impression / Assessment and Plan / ED Course  I have reviewed the triage vital signs and the nursing notes.  Pertinent labs & imaging results that were available during my care of the patient were reviewed by me and considered in my  medical decision making (see chart for details).  Clinical Course    82 yo M with n/v/d.  Going on for past couple days.  No resolved, able to tolerate PO without difficulty.  Wife now has similar symptoms.  Given iv fluids and zofran with improvement.  D/c home.   10:53 PM:  I have discussed the diagnosis/risks/treatment options with the patient and family and believe the pt to  be eligible for discharge home to follow-up with PCP. We also discussed returning to the ED immediately if new or worsening sx occur. We discussed the sx which are most concerning (e.g., sudden worsening pain, fever, inability to tolerate by mouth) that necessitate immediate return. Medications administered to the patient during their visit and any new prescriptions provided to the patient are listed below.  Medications given during this visit Medications  sodium chloride 0.9 % bolus 1,000 mL (1,000 mLs Intravenous New Bag/Given 03/13/16 2205)  ondansetron [facility]) injection 4 mg (4 mg Intravenous Given 03/13/16 2204)  acetaminophen (TYLENOL) tablet 1,000 mg (1,000 mg Oral Given 03/13/16 2204)  morphine 2 MG/ML injection 2 mg (2 mg Intravenous Given 03/13/16 2204)     The patient appears reasonably screen and/or stabilized for discharge and I doubt any other medical condition or other [facility] requiring further screening, evaluation, or treatment in the ED at this time prior to discharge.    Final Clinical Impressions(s) / ED Diagnoses   Final diagnoses:  Nausea vomiting and diarrhea    New Prescriptions New Prescriptions   ONDANSETRON (ZOFRAN ODT) 4 MG DISINTEGRATING TABLET    4mg  ODT q4 hours prn nausea/vomit     I personally performed the services described in this documentation, which was scribed in my presence. The recorded information has been reviewed and is accurate.    Deno Etienne, DO 03/13/16 2253

## 2016-03-14 ENCOUNTER — Encounter (HOSPITAL_BASED_OUTPATIENT_CLINIC_OR_DEPARTMENT_OTHER): Payer: Self-pay | Admitting: *Deleted

## 2016-03-14 ENCOUNTER — Emergency Department (HOSPITAL_BASED_OUTPATIENT_CLINIC_OR_DEPARTMENT_OTHER)
Admission: EM | Admit: 2016-03-14 | Discharge: 2016-03-14 | Disposition: A | Discharge status: Home / Self Care | Payer: Medicare Other | Attending: Emergency Medicine | Admitting: Emergency Medicine

## 2016-03-14 DIAGNOSIS — R197 Diarrhea, unspecified: Secondary | ICD-10-CM | POA: Insufficient documentation

## 2016-03-14 DIAGNOSIS — R112 Nausea with vomiting, unspecified: Secondary | ICD-10-CM | POA: Insufficient documentation

## 2016-03-14 DIAGNOSIS — J449 Chronic obstructive pulmonary disease, unspecified: Secondary | ICD-10-CM | POA: Insufficient documentation

## 2016-03-14 DIAGNOSIS — Z79899 Other long term (current) drug therapy: Secondary | ICD-10-CM | POA: Insufficient documentation

## 2016-03-14 DIAGNOSIS — Z7982 Long term (current) use of aspirin: Secondary | ICD-10-CM | POA: Insufficient documentation

## 2016-03-14 DIAGNOSIS — I2510 Atherosclerotic heart disease of native coronary artery without angina pectoris: Secondary | ICD-10-CM | POA: Insufficient documentation

## 2016-03-14 DIAGNOSIS — R1084 Generalized abdominal pain: Secondary | ICD-10-CM | POA: Diagnosis not present

## 2016-03-14 DIAGNOSIS — Z87891 Personal history of nicotine dependence: Secondary | ICD-10-CM | POA: Diagnosis not present

## 2016-03-14 DIAGNOSIS — I10 Essential (primary) hypertension: Secondary | ICD-10-CM | POA: Insufficient documentation

## 2016-03-14 MED ORDER — SODIUM CHLORIDE 0.9 % IV BOLUS (SEPSIS)
1000.0000 mL | Freq: Once | INTRAVENOUS | Status: AC
Start: 2016-03-14 — End: 2016-03-14
  Administered 2016-03-14: 1000 mL via INTRAVENOUS

## 2016-03-14 MED ORDER — PROMETHAZINE HCL 25 MG PO TABS
25.0000 mg | ORAL_TABLET | Freq: Once | ORAL | Status: AC
  Administered 2016-03-14: 25 mg via ORAL
  Filled 2016-03-14: qty 1

## 2016-03-14 MED ORDER — PROMETHAZINE HCL 25 MG PO TABS: 25.0000 mg | ORAL_TABLET | Freq: Four times a day (QID) | ORAL | 0 refills | Status: DC | PRN

## 2016-03-14 MED ORDER — ONDANSETRON 4 MG PO TBDP
4.0000 mg | ORAL_TABLET | Freq: Once | ORAL | Status: DC
  Filled 2016-03-14: qty 1

## 2016-03-14 NOTE — ED Notes (Signed)
Pt and son given d/c instructions. Verbalizes understanding. No questions. Rx x 1

## 2016-03-14 NOTE — ED Notes (Addendum)
Pt states the Zofran is not controlling his nausea. Pt states that he is able to keep down fluids.

## 2016-03-14 NOTE — ED Triage Notes (Signed)
Pt seen her last p.m. For abd pain N/V/D Given meds. Returns because s/s have continued. No V/D today, however still nauseated.

## 2016-03-14 NOTE — ED Provider Notes (Signed)
[facility] DEPT [facility] Provider Note   CSN: [id number] Arrival date & time: 03/14/16  2045  By signing my name below, I, Reola Mosher, attest that this documentation has been prepared under the direction and in the presence of Deno Etienne, DO. Electronically Signed: Reola Mosher, ED Scribe. 03/14/16. 9:34 PM.  History   Chief Complaint Chief Complaint  Patient presents with  . Abdominal Pain   The history is provided by the patient and medical records. No language interpreter was used.  Abdominal Pain   This is a new problem. The current episode started more than 2 days ago. The problem occurs constantly. The problem has been gradually worsening. The pain is associated with an unknown factor. The pain is located in the generalized abdominal region. The pain is moderate. Associated symptoms include nausea. Pertinent negatives include fever, diarrhea, vomiting, headaches, arthralgias and myalgias. Nothing aggravates the symptoms. Nothing relieves the symptoms. Past workup does not include GI consult, CT scan, ultrasound, surgery or barium enema. His past medical history is significant for GERD. His past medical history does not include Crohn's disease.   HPI Comments: Bryan Hatfield is a 82 y.o. male with a PMHx of GERD, COPD, and CAD, who presents to the Emergency Department complaining of gradually worsening generalized abdominal pain onset ~3 days ago. He reports associated nausea and dry cough secondary to his abdominal pain today. Pt was seen in the ED for same ~1 day ago, and at that time he was given an rx for Zofran and Imodium which he has been taking with minimal relief of his nausea. However, he states that his previous diarrhea and emesis has resolved prior to coming into the ED. His abdominal pain is exacerbated with coughing. Pt has had decreased food intake but normal fluid intake since the onset of his symptoms. No recent travel outside of the country or otherwise. No  sick contacts with similar symptoms. No other associated symptoms or complaints.   Past Medical History:  Diagnosis Date  . Acute respiratory failure ([place]) 09/25/11   hypoxic  . Anxiety   . CAD (coronary artery disease)    a. PTCA 2001. b. f/u cath 05/2012 heavy calcification, no PCI required. c. f/u cath for recurrent sx 2017: stable mod CAD unchanged from prior, mild progression of small vessel disease, elevated LVEDP.  . Colon polyps   . COPD (chronic obstructive pulmonary disease) ([place])   . GERD (gastroesophageal reflux disease)   . High cholesterol   . Hypertension   . Peripheral vascular disease ([place])    patient denies knowledge of, but listed in his chart  . Pleural effusion    Parapneumonic ?r/t PNA s/p thoracentesis 2008  . Pneumonia    hx  . Sleep apnea    cpap    Patient Active Problem List   Diagnosis Date Noted  . Dyspnea 05/29/2015  . Insomnia 02/19/2015  . Thyroid activity decreased 11/21/2014  . GAD (generalized anxiety disorder) 11/21/2014  . Erythrasma 09/18/2014  . Weakness 09/17/2014  . Arterial hypotension 09/17/2014  . Subclinical hypothyroidism 09/07/2014  . Episode of dizziness 08/30/2014  . Prediabetes 06/08/2013  . Essential hypertension, benign 06/02/2013  . Hyperlipidemia 06/02/2013  . Depression 06/02/2013  . CAD S/P percutaneous coronary angioplasty 05/09/2012  . Morbid obesity ([place]) 09/25/2011  . INSOMNIA UNSPECIFIED 09/21/2007  . COPD GOLD 0/I 09/21/2007  . Sleep apnea 09/20/2007   Past Surgical History:  Procedure Laterality Date  . CARDIAC CATHETERIZATION N/A 11/25/2015   Procedure:  Left Heart Cath and Coronary Angiography;  Surgeon: Leonie Man, MD;  Location: [facility];  Service: Cardiovascular;  Laterality: N/A;  . CATARACT EXTRACTION W/ INTRAOCULAR LENS  IMPLANT, BILATERAL  ~ 2006  . CHOLECYSTECTOMY N/A 10/11/2012   Procedure: LAPAROSCOPIC CHOLECYSTECTOMY WITH INTRAOPERATIVE CHOLANGIOGRAM;  Surgeon: Adin Hector,  MD;  Location: [facility];  Service: General;  Laterality: N/A;  . CORONARY ANGIOPLASTY  01/2000  . HEMORRHOID SURGERY  1960's    Home Medications    Prior to Admission medications   Medication Sig Start Date End Date Taking? Authorizing Provider  albuterol (PROVENTIL) (2.5 MG/3ML) 0.083% nebulizer solution Take 2.5 mg by nebulization every 6 (six) hours as needed for wheezing. Breathing treatment 10/06/15   Historical Provider, MD  Ascorbic Acid (VITAMIN C PO) Take 1 tablet by mouth daily.    Historical Provider, MD  aspirin EC 81 MG tablet Take 81 mg by mouth daily.      Historical Provider, MD  atorvastatin (LIPITOR) 40 MG tablet Take 1 tablet (40 mg total) by mouth at bedtime. 07/30/15   [name], MD  budesonide-formoterol [facility]) 160-4.5 MCG/ACT inhaler Inhale 2 puffs into the lungs 2 (two) times daily.    Historical Provider, MD  escitalopram (LEXAPRO) 10 MG tablet Take 1 tablet (10 mg total) by mouth daily. 09/05/15   [name], MD  furosemide (LASIX) 40 MG tablet TAKE 1 TABLET BY MOUTH  EVERY OTHER DAY USUALLY ON  MONDAY,WEDNESDAY,FRIDAY 01/21/16   [name], MD  imiquimod [place]) 5 % cream Apply 1 application topically daily as needed (rash).     Historical Provider, MD  isosorbide mononitrate (IMDUR) 30 MG 24 hr tablet Take 1 tablet (30 mg total) by mouth daily. 12/24/15 03/23/16  Dayna N Dunn, PA-C  levothyroxine (SYNTHROID, LEVOTHROID) 112 MCG tablet TAKE 1 TABLET(112 MCG) BY MOUTH DAILY BEFORE BREAKFAST 06/26/15   [name], MD  Multiple Vitamins-Minerals (ICAPS MV PO) Take 1 capsule by mouth daily.    Historical Provider, MD  Nebulizers (COMPRESSOR/NEBULIZER) MISC by Does not apply route. 10/06/15   Historical Provider, MD  NITROSTAT 0.4 MG SL tablet DISSOLVE 1 TABLET UNDER THE TONGUE EVERY 5 MINUTES AS  NEEDED CHEST PAIN 03/10/16   [name], MD  ondansetron (ZOFRAN ODT) 4 MG disintegrating tablet 4mg  ODT q4 hours prn nausea/vomit  03/13/16   Deno Etienne, DO  promethazine (PHENERGAN) 25 MG tablet Take 1 tablet (25 mg total) by mouth every 6 (six) hours as needed for nausea or vomiting. 03/14/16   Deno Etienne, DO  triamcinolone cream (KENALOG) 0.1 % Apply 1 application topically daily as needed (rash).     Historical Provider, MD   Family History Family History  Problem Relation Age of Onset  . Stroke Mother   . Arthritis Mother   . Asthma Mother   . Emphysema Mother   . Stroke Father   . Arthritis Sister   . Stroke Sister   . Heart attack Neg Hx    Social History Social History  Substance Use Topics  . Smoking status: Former Smoker    Packs/day: 1.00    Years: 10.00    Types: Cigarettes    Quit date: 06/01/1982  . Smokeless tobacco: Never Used     Comment: "quit smoking ~ 25 years ago; smoked ~ 1 pk/wk"  . Alcohol use No   Allergies   Review of patient's allergies indicates no known allergies.  Review of Systems Review of Systems  Constitutional: Negative for chills and fever.  HENT: Negative for congestion and facial swelling.   Eyes: Negative for discharge and visual disturbance.  Respiratory: Negative for shortness of breath.   Cardiovascular: Negative for chest pain and palpitations.  Gastrointestinal: Positive for abdominal pain and nausea. Negative for diarrhea and vomiting.  Musculoskeletal: Negative for arthralgias and myalgias.  Skin: Negative for color change and rash.  Neurological: Negative for tremors, syncope and headaches.  Psychiatric/Behavioral: Negative for confusion and dysphoric mood.   Physical Exam Updated Vital Signs BP 133/68 (BP Location: Left Arm)   Pulse 80   Temp 98.1 F (36.7 C) (Oral)   Resp 20   Ht 5\' 11"  (1.803 m)   Wt 269 lb (122 kg)   SpO2 94%   BMI 37.52 kg/m   Physical Exam  Constitutional: He is oriented to person, place, and time. He appears well-developed and well-nourished.  HENT:  Head: Normocephalic and atraumatic.  Eyes: EOM are normal. Pupils are  equal, round, and reactive to light.  Neck: Normal range of motion. Neck supple. No JVD present.  Cardiovascular: Normal rate and regular rhythm.  Exam reveals no gallop and no friction rub.   No murmur heard. Pulmonary/Chest: No respiratory distress. He has no wheezes.  Abdominal: He exhibits no distension. There is no tenderness. There is no rebound and no guarding.  Musculoskeletal: Normal range of motion.  Neurological: He is alert and oriented to person, place, and time.  Skin: No rash noted. No pallor.  Psychiatric: He has a normal mood and affect. His behavior is normal.  Nursing note and vitals reviewed.  ED Treatments / Results  DIAGNOSTIC STUDIES: Oxygen Saturation is 94%, on RA, adequate by my interpretation.   COORDINATION OF CARE: 9:09 PM-Discussed next steps with pt. Pt verbalized understanding and is agreeable with the plan.   Labs (all labs ordered are listed, but only abnormal results are displayed) Labs Reviewed - No data to display  EKG  EKG Interpretation None      Radiology Dg Chest 2 View  Result Date: 03/13/2016 CLINICAL DATA:  82 y/o M; cough, congestion, fever, vomiting, and diarrhea for 2 days. EXAM: CHEST  2 VIEW COMPARISON:  11/11/2015 CT chest.  03/27/2015 chest radiograph. FINDINGS: Stable borderline cardiomegaly. Clear lungs. No effusion or pneumothorax. Degenerative changes of the thoracic spine. IMPRESSION: No active cardiopulmonary disease.  Stable borderline cardiomegaly. Electronically Signed   By: Kristine Garbe M.D.   On: 03/13/2016 22:09   Procedures Procedures (including critical care time)  Medications Ordered in ED Medications  promethazine (PHENERGAN) tablet 25 mg (not administered)  sodium chloride 0.9 % bolus 1,000 mL (0 mLs Intravenous Stopped 03/14/16 2208)   Initial Impression / Assessment and Plan / ED Course  I have reviewed the triage vital signs and the nursing notes.  Pertinent labs & imaging results that  were available during my care of the patient were reviewed by me and considered in my medical decision making (see chart for details).  Clinical Course   82 yo M With a chief complaint of nausea. Patient is just gotten over a likely viral nausea vomiting and diarrheal illness. He has been able to tolerate liquids but not solids. Given Zofran yesterday with minimal improvement. Patient was given Phenergan and feels completely better.  10:12 PM:  I have discussed the diagnosis/risks/treatment options with the patient and family and believe the pt to be eligible for discharge home to follow-up with PCP. We also discussed returning to the ED  immediately if new or worsening sx occur. We discussed the sx which are most concerning (e.g., sudden worsening pain, fever, inability to tolerate by mouth) that necessitate immediate return. Medications administered to the patient during their visit and any new prescriptions provided to the patient are listed below.  Medications given during this visit Medications  promethazine (PHENERGAN) tablet 25 mg (not administered)  sodium chloride 0.9 % bolus 1,000 mL (0 mLs Intravenous Stopped 03/14/16 2208)     The patient appears reasonably screen and/or stabilized for discharge and I doubt any other medical condition or other [facility] requiring further screening, evaluation, or treatment in the ED at this time prior to discharge.    Final Clinical Impressions(s) / ED Diagnoses   Final diagnoses:  Nausea vomiting and diarrhea   New Prescriptions New Prescriptions   PROMETHAZINE (PHENERGAN) 25 MG TABLET    Take 1 tablet (25 mg total) by mouth every 6 (six) hours as needed for nausea or vomiting.   I personally performed the services described in this documentation, which was scribed in my presence. The recorded information has been reviewed and is accurate.     Deno Etienne, DO 03/14/16 2212

## 2016-03-15 ENCOUNTER — Other Ambulatory Visit: Payer: Self-pay | Admitting: Family Medicine

## 2016-03-20 ENCOUNTER — Ambulatory Visit (INDEPENDENT_AMBULATORY_CARE_PROVIDER_SITE_OTHER): Payer: Medicare Other | Admitting: Sports Medicine

## 2016-03-20 DIAGNOSIS — A084 Viral intestinal infection, unspecified: Secondary | ICD-10-CM

## 2016-03-20 MED ORDER — PROMETHAZINE HCL 25 MG PO TABS: 25.0000 mg | ORAL_TABLET | Freq: Three times a day (TID) | ORAL | 3 refills | Status: DC | PRN

## 2016-03-20 MED ORDER — ONDANSETRON 8 MG PO TBDP: 8.0000 mg | ORAL_TABLET | Freq: Three times a day (TID) | ORAL | 3 refills | Status: DC | PRN

## 2016-03-20 NOTE — Assessment & Plan Note (Signed)
Has had 2 visits to the emergency department, he was given anti-emetics and IV fluids. Blood work was overall unremarkable with the exception of mild hypokalemia which we will recheck. He still has a bit of nausea, diarrhea has resolved, no melena, hematemesis, hematochezia. Adding Zofran and Phenergan to be taken together. Return as needed.

## 2016-03-20 NOTE — Progress Notes (Signed)
  Subjective:    CC: Follow-up  HPI: This is a pleasant 82 year old male, he was recently in the ER twice for nausea, vomiting, and diarrhea, no melena, hematochezia, hematemesis. Blood work was negative including a negative lipase. Never had any imaging, he was given fluids, Phenergan, Zofran and reports his symptoms of almost completely resolved. He only has a bit of residual nausea, he also had some hypokalemia to 3.2 in the ER.  Past medical history:  Negative.  See flowsheet/record as well for more information.  Surgical history: Negative.  See flowsheet/record as well for more information.  Family history: Negative.  See flowsheet/record as well for more information.  Social history: Negative.  See flowsheet/record as well for more information.  Allergies, and medications have been entered into the medical record, reviewed, and no changes needed.   Review of Systems: No fevers, chills, night sweats, weight loss, chest pain, or shortness of breath.   Objective:    General: Well Developed, well nourished, and in no acute distress.  Neuro: Alert and oriented x3, extra-ocular muscles intact, sensation grossly intact.  HEENT: Normocephalic, atraumatic, pupils equal round reactive to light, neck supple, no masses, no lymphadenopathy, thyroid nonpalpable.  Skin: Warm and dry, no rashes. Cardiac: Regular rate and rhythm, no murmurs rubs or gallops, no lower extremity edema.  Respiratory: Clear to auscultation bilaterally. Not using accessory muscles, speaking in full sentences. Abdomen: Soft, nontender, nondistended, no bowel sounds, no palpable masses, no guarding, rigidity, rebound tenderness.  Impression and Recommendations:    Viral gastroenteritis Has had 2 visits to the emergency department, he was given anti-emetics and IV fluids. Blood work was overall unremarkable with the exception of mild hypokalemia which we will recheck. He still has a bit of nausea, diarrhea has resolved, no  melena, hematemesis, hematochezia. Adding Zofran and Phenergan to be taken together. Return as needed.

## 2016-03-21 LAB — BASIC METABOLIC PANEL WITH GFR
Calcium: 8.4 mg/dL — ABNORMAL LOW (ref 8.6–10.3)
Creat: 0.89 mg/dL (ref 0.70–1.11)
Sodium: 141 mmol/L (ref 135–146)

## 2016-03-21 LAB — BASIC METABOLIC PANEL
BUN: 14 mg/dL (ref 7–25)
CO2: 26 mmol/L (ref 20–31)
Chloride: 102 mmol/L (ref 98–110)
Glucose, Bld: 75 mg/dL (ref 65–99)
Potassium: 4.2 mmol/L (ref 3.5–5.3)

## 2016-04-05 ENCOUNTER — Other Ambulatory Visit: Payer: Self-pay | Admitting: Family Medicine

## 2016-04-07 DIAGNOSIS — J40 Bronchitis, not specified as acute or chronic: Secondary | ICD-10-CM | POA: Diagnosis not present

## 2016-04-09 ENCOUNTER — Telehealth: Payer: Self-pay | Admitting: *Deleted

## 2016-04-09 NOTE — Telephone Encounter (Signed)
Received refill request for lexapro from mail order pharm. Patient is overdue for appointment.called patient and asked him to schedule a f/u appt with metheney.Patient voiced understanding and states he would schedule an appointment. Patient did confirm that he is still taking the lexapro.

## 2016-04-21 ENCOUNTER — Encounter: Payer: Self-pay | Admitting: Sports Medicine

## 2016-04-21 ENCOUNTER — Ambulatory Visit (INDEPENDENT_AMBULATORY_CARE_PROVIDER_SITE_OTHER): Payer: Medicare Other | Admitting: Sports Medicine

## 2016-04-21 DIAGNOSIS — R05 Cough: Secondary | ICD-10-CM | POA: Diagnosis not present

## 2016-04-21 DIAGNOSIS — R059 Cough, unspecified: Secondary | ICD-10-CM | POA: Insufficient documentation

## 2016-04-21 MED ORDER — ZOLPIDEM TARTRATE 5 MG PO TABS: 5.0000 mg | ORAL_TABLET | Freq: Every evening | ORAL | 0 refills | Status: DC | PRN

## 2016-04-21 MED ORDER — AZITHROMYCIN 250 MG PO TABS: ORAL_TABLET | ORAL | 0 refills | Status: DC

## 2016-04-21 MED ORDER — ESCITALOPRAM OXALATE 10 MG PO TABS: 10.0000 mg | ORAL_TABLET | Freq: Every day | ORAL | 0 refills | Status: DC

## 2016-04-21 MED ORDER — FLUTICASONE PROPIONATE 50 MCG/ACT NA SUSP: NASAL | 3 refills | Status: DC

## 2016-04-21 NOTE — Assessment & Plan Note (Signed)
Most likely rhinitis with postnasal drip syndrome, adding Flonase. I'm also giving him a prescription of azithromycin which he will not fill unless he coughs for another week.

## 2016-04-21 NOTE — Progress Notes (Signed)
  Subjective:    CC: Coughing  HPI: For several days this pleasant 82 year old male has had increasing cough, fatigue, malaise. Cough is minimally productive, no sinus pain or pressure, mild runny nose, sore throat. No shortness of breath, chest pain, leg swelling. No rashes, or GI symptoms.  Past medical history:  Negative.  See flowsheet/record as well for more information.  Surgical history: Negative.  See flowsheet/record as well for more information.  Family history: Negative.  See flowsheet/record as well for more information.  Social history: Negative.  See flowsheet/record as well for more information.  Allergies, and medications have been entered into the medical record, reviewed, and no changes needed.   Review of Systems: No fevers, chills, night sweats, weight loss, chest pain, or shortness of breath.   Objective:    General: Well Developed, well nourished, and in no acute distress.  Neuro: Alert and oriented x3, extra-ocular muscles intact, sensation grossly intact.  HEENT: Normocephalic, atraumatic, pupils equal round reactive to light, neck supple, no masses, no lymphadenopathy, thyroid nonpalpable. Oropharynx, nasopharynx, ear canals unremarkable Skin: Warm and dry, no rashes. Cardiac: Regular rate and rhythm, no murmurs rubs or gallops, no lower extremity edema.  Respiratory: Clear to auscultation bilaterally. Not using accessory muscles, speaking in full sentences.  Impression and Recommendations:    Cough Most likely rhinitis with postnasal drip syndrome, adding Flonase. I'm also giving him a prescription of azithromycin which he will not fill unless he coughs for another week.

## 2016-04-23 ENCOUNTER — Other Ambulatory Visit: Payer: Self-pay | Admitting: Family Medicine

## 2016-04-23 DIAGNOSIS — L309 Dermatitis, unspecified: Secondary | ICD-10-CM

## 2016-04-28 ENCOUNTER — Ambulatory Visit (INDEPENDENT_AMBULATORY_CARE_PROVIDER_SITE_OTHER): Payer: Medicare Other | Admitting: Family Medicine

## 2016-04-28 ENCOUNTER — Encounter: Payer: Self-pay | Admitting: Family Medicine

## 2016-04-28 VITALS — BP 138/68 | HR 81 | Ht 71.0 in | Wt 267.0 lb

## 2016-04-28 DIAGNOSIS — I10 Essential (primary) hypertension: Secondary | ICD-10-CM

## 2016-04-28 DIAGNOSIS — R7303 Prediabetes: Secondary | ICD-10-CM | POA: Diagnosis not present

## 2016-04-28 DIAGNOSIS — F3341 Major depressive disorder, recurrent, in partial remission: Secondary | ICD-10-CM | POA: Diagnosis not present

## 2016-04-28 DIAGNOSIS — F5101 Primary insomnia: Secondary | ICD-10-CM

## 2016-04-28 LAB — POCT GLYCOSYLATED HEMOGLOBIN (HGB A1C): Hemoglobin A1C: 5.9

## 2016-04-28 MED ORDER — ZOLPIDEM TARTRATE 5 MG PO TABS: 5.0000 mg | ORAL_TABLET | Freq: Every evening | ORAL | 0 refills | Status: DC | PRN

## 2016-04-28 NOTE — Progress Notes (Signed)
Subjective:    CC:   HPI:  Hypertension- Pt denies chest pain, SOB, dizziness, or heart palpitations.  Taking meds as directed w/o problems.  Denies medication side effects.    F/U depression - He is currently on Lexapro 10 mg daily.He reports he is doing well and is happy on his current regimen. He denies feeling down depressed or hopeless.  Insomnia-previously failed trazodone and was switched to Ambien a year ago. He does request a 90 day supply on the medication. He says a lot of nights he takes one half tabs.  IFG - no inc thirst or urination.    Past medical history, Surgical history, Family history not pertinant except as noted below, Social history, Allergies, and medications have been entered into the medical record, reviewed, and corrections made.   Review of Systems: No fevers, chills, night sweats, weight loss, chest pain, or shortness of breath.   Objective:    General: Well Developed, well nourished, and in no acute distress.  Neuro: Alert and oriented x3, extra-ocular muscles intact, sensation grossly intact.  HEENT: Normocephalic, atraumatic  Skin: Warm and dry, no rashes. Cardiac: Regular rate and rhythm, no murmurs rubs or gallops, no lower extremity edema.  Respiratory: Clear to auscultation bilaterally. Not using accessory muscles, speaking in full sentences.   Impression and Recommendations:    HTN - Well controlled. Continue current regimen. Follow up in  6 mo.   Insomnia - Encouraged him not to use the Ambien every night. I would really rather him use it as needed. That's how he was using it previously but at some point he started using it almost every night.  Depression-continue Lexapro 10 mg he is not quite ready to wean off the medication and wants to continue it for now.  He did want to update me. He's having some leaking and incontinence issues and is having to wear a pad. He actually has an appointment with urology tomorrow to take a look at  this.  IFG - Able from previous. Continue to monitor every 6 months. Did encourage him to get back into the gym which he is planning on doing to work on diet and weight loss. Lab Results  Component Value Date   HGBA1C 5.9 04/28/2016

## 2016-04-29 ENCOUNTER — Other Ambulatory Visit: Payer: Self-pay | Admitting: Family Medicine

## 2016-05-07 DIAGNOSIS — J40 Bronchitis, not specified as acute or chronic: Secondary | ICD-10-CM | POA: Diagnosis not present

## 2016-05-31 ENCOUNTER — Other Ambulatory Visit: Payer: Self-pay | Admitting: Family Medicine

## 2016-06-07 DIAGNOSIS — J40 Bronchitis, not specified as acute or chronic: Secondary | ICD-10-CM | POA: Diagnosis not present

## 2016-06-12 DIAGNOSIS — N3281 Overactive bladder: Secondary | ICD-10-CM | POA: Diagnosis not present

## 2016-06-26 ENCOUNTER — Encounter: Payer: Self-pay | Admitting: Interventional Cardiology

## 2016-06-26 ENCOUNTER — Encounter (INDEPENDENT_AMBULATORY_CARE_PROVIDER_SITE_OTHER): Payer: Self-pay

## 2016-06-26 ENCOUNTER — Ambulatory Visit (INDEPENDENT_AMBULATORY_CARE_PROVIDER_SITE_OTHER): Payer: Medicare Other | Admitting: Interventional Cardiology

## 2016-06-26 VITALS — BP 146/82 | HR 73 | Ht 71.0 in | Wt 279.0 lb

## 2016-06-26 DIAGNOSIS — E784 Other hyperlipidemia: Secondary | ICD-10-CM

## 2016-06-26 DIAGNOSIS — G4730 Sleep apnea, unspecified: Secondary | ICD-10-CM

## 2016-06-26 DIAGNOSIS — I2510 Atherosclerotic heart disease of native coronary artery without angina pectoris: Secondary | ICD-10-CM

## 2016-06-26 DIAGNOSIS — Z9861 Coronary angioplasty status: Secondary | ICD-10-CM

## 2016-06-26 DIAGNOSIS — I10 Essential (primary) hypertension: Secondary | ICD-10-CM

## 2016-06-26 DIAGNOSIS — E7849 Other hyperlipidemia: Secondary | ICD-10-CM

## 2016-06-26 NOTE — Progress Notes (Addendum)
Cardiology Office Note    Date:  06/26/2016   ID:  Bryan Hatfield, DOB [birth year], MRN [id number]  PCP:  Bryan Lecher, MD  Cardiologist: Bryan Grooms, MD   Chief Complaint  Patient presents with  . Coronary Artery Disease    History of Present Illness:  Bryan Hatfield is a 83 y.o. male who has CAD, PTCA 2001, heavy coronary calcification at cath, chronic diastolic heart failure, and hypertension. Medical history also includes HLD, COPD, OSA on CPAP, parapneumonic pleural effusion r/t PNA s/p thoracentesis 2008, h/o asbestos exposure, remote tobacco abuse (only 5 year history of smoking) who presents for f/u.    He is doing well. He denies angina. He has not had syncope any cardiac problems. He takes his medications as prescribed. He is concerned about weight gain. He says he gets sick midway through last year and has gained weight since then. He does admit to eating fast foods and sandwiches frequently. He feels he has gained over 40 pounds in the last year.     Past Medical History:  Diagnosis Date  . Acute respiratory failure ([place]) 09/25/11   hypoxic  . Anxiety   . CAD (coronary artery disease)    a. PTCA 2001. b. f/u cath 05/2012 heavy calcification, no PCI required. c. f/u cath for recurrent sx 2017: stable mod CAD unchanged from prior, mild progression of small vessel disease, elevated LVEDP.  . Colon polyps   . COPD (chronic obstructive pulmonary disease) ([place])   . GERD (gastroesophageal reflux disease)   . High cholesterol   . Hypertension   . Peripheral vascular disease ([place])    patient denies knowledge of, but listed in his chart  . Pleural effusion    Parapneumonic ?r/t PNA s/p thoracentesis 2008  . Pneumonia    hx  . Sleep apnea    cpap     Past Surgical History:  Procedure Laterality Date  . CARDIAC CATHETERIZATION N/A 11/25/2015   Procedure: Left Heart Cath and Coronary Angiography;  Surgeon: Leonie Man, MD;  Location: [facility];   Service: Cardiovascular;  Laterality: N/A;  . CATARACT EXTRACTION W/ INTRAOCULAR LENS  IMPLANT, BILATERAL  ~ 2006  . CHOLECYSTECTOMY N/A 10/11/2012   Procedure: LAPAROSCOPIC CHOLECYSTECTOMY WITH INTRAOPERATIVE CHOLANGIOGRAM;  Surgeon: Adin Hector, MD;  Location: [facility];  Service: General;  Laterality: N/A;  . CORONARY ANGIOPLASTY  01/2000  . HEMORRHOID SURGERY  1960's    Current Medications: Outpatient Medications Prior to Visit  Medication Sig Dispense Refill  . albuterol (PROVENTIL) (2.5 MG/3ML) 0.083% nebulizer solution Take 2.5 mg by nebulization every 6 (six) hours as needed for wheezing. Breathing treatment    . Ascorbic Acid (VITAMIN C PO) Take 1 tablet by mouth daily.    Marland Kitchen aspirin EC 81 MG tablet Take 81 mg by mouth daily.      Marland Kitchen atorvastatin (LIPITOR) 40 MG tablet TAKE 1 TABLET BY MOUTH AT  BEDTIME 90 tablet 1  . escitalopram (LEXAPRO) 10 MG tablet Take 1 tablet (10 mg total) by mouth daily. NEED FOLLOW UP APPOINTMENT FOR MORE REFILLS 90 tablet 0  . fluticasone (FLONASE) 50 MCG/ACT nasal spray One spray in each nostril twice a day, use left hand for right nostril, and right hand for left nostril. 48 g 3  . furosemide (LASIX) 40 MG tablet TAKE 1 TABLET BY MOUTH  EVERY OTHER DAY USUALLY ON  MONDAY,WEDNESDAY,FRIDAY 45 tablet 1  . levothyroxine (SYNTHROID, LEVOTHROID) 112 MCG tablet Take 1  tablet (112 mcg total) by mouth daily before breakfast. NEED LAB WORK FOR MORE REFILLS 90 tablet 0  . Multiple Vitamins-Minerals (ICAPS MV PO) Take 1 capsule by mouth daily.    . Nebulizers (COMPRESSOR/NEBULIZER) MISC by Does not apply route.    Marland Kitchen NITROSTAT 0.4 MG SL tablet DISSOLVE 1 TABLET UNDER THE TONGUE EVERY 5 MINUTES AS  NEEDED CHEST PAIN 50 tablet 1  . triamcinolone cream (KENALOG) 0.1 % APPLY EXTERNALLY TO THE AFFECTED AREA DAILY FOR UP TO 2 WEEKS AS NEEDED. AVOID FACE 80 g 1  . zolpidem (AMBIEN) 5 MG tablet Take 1 tablet (5 mg total) by mouth at bedtime as needed for sleep. Ok to refill  06/01/2016 90 tablet 0  . isosorbide mononitrate (IMDUR) 30 MG 24 hr tablet Take 1 tablet (30 mg total) by mouth daily. 90 tablet 3   No facility-administered medications prior to visit.      Allergies:   Patient has no known allergies.   Social History   Social History  . Marital status: Married    Spouse name: N/A  . Number of children: N/A  . Years of education: N/A   Occupational History  . Retired     Surveyor, quantity - Civil engineer, [employer] and [employer]   Social History Main Topics  . Smoking status: Former Smoker    Packs/day: 1.00    Years: 10.00    Types: Cigarettes    Quit date: 06/01/1982  . Smokeless tobacco: Never Used     Comment: "quit smoking ~ 25 years ago; smoked ~ 1 pk/wk"  . Alcohol use No  . Drug use: No  . Sexual activity: No   Other Topics Concern  . None   Social History Narrative  . None     Family History:  The patient's family history includes Arthritis in his mother and sister; Asthma in his mother; Emphysema in his mother; Stroke in his father, mother, and sister.   ROS:   Please see the history of present illness.    No specific complaints other than concern about weight cane  All other systems reviewed and are negative.   PHYSICAL EXAM:   VS:  BP (!) 146/82 (BP Location: Left Arm)   Pulse 73   Ht 5\' 11"  (1.803 m)   Wt 279 lb (126.6 kg)   BMI 38.91 kg/m    GEN: Well nourished, well developed, in no acute distress. Morbid obesity.  HEENT: normal  Neck: no JVD, carotid bruits, or masses Cardiac: RRR; no murmurs, rubs, or gallops,no edema  Respiratory:  clear to auscultation bilaterally, normal work of breathing GI: soft, nontender, nondistended, + BS MS: no deformity or atrophy  Skin: warm and dry, no rash Neuro:  Alert and Oriented x 3, Strength and sensation are intact Psych: euthymic mood, full affect  Wt Readings from Last 3 Encounters:  06/26/16 279 lb (126.6 kg)  04/28/16 267 lb (121.1 kg)  04/21/16 270 lb (122.5 kg)      Studies/Labs  Reviewed:   EKG:  EKG  Not performed  Recent Labs: 09/06/2015: TSH 1.75 11/01/2015: Brain Natriuretic Peptide 34.5 03/13/2016: ALT 30; Hemoglobin 13.5; Platelets 130 03/20/2016: BUN 14; Creat 0.89; Potassium 4.2; Sodium 141   Lipid Panel    Component Value Date/Time   CHOL 145 09/05/2014 0837   TRIG 84 09/05/2014 0837   HDL 59 09/05/2014 0837   CHOLHDL 2.5 09/05/2014 0837   VLDL 17 09/05/2014 0837   LDLCALC 69 09/05/2014 0837    Additional studies/  records that were reviewed today include:  June 2017, coronary angiography: Coronary Diagrams   Diagnostic Diagram         ASSESSMENT:    1. Essential hypertension, benign   2. CAD S/P percutaneous coronary angioplasty   3. Sleep apnea, unspecified type   4. Other hyperlipidemia   5. Morbid obesity ([place])      PLAN:  In order of problems listed above:  1. Encouraged the patient to lose weight. 2 g sodium diet. No change in medical regimen. Target blood pressure 150/90 mmHg or less. 2. Notify us if angina. No current complaints. Recently updated heart cath (noted above). Will know for future reference that the patient has some level of anxiety concerning his heart and complains of chest discomfort that most of the time has no clinical features to suggest angina, Hopefully he will not undergo repeated heart catheterizations with anatomy looking as good as it does on the most recent study. 3. Not addressed 4. Followed by primary care. LDL target is 70. 5. Strongly encouraged patient to get back into a walking program. Decreased caloric intake and attempt to lose weight.    Medication Adjustments/Labs and Tests Ordered: Current medicines are reviewed at length with the patient today.  Concerns regarding medicines are outlined above.  Medication changes, Labs and Tests ordered today are listed in the Patient Instructions below. Patient Instructions  Medication Instructions:  None  Labwork: TSH  today  Testing/Procedures: None  Follow-Up: Your physician wants you to follow-up in: 1 year with Dr. [name].  You will receive a reminder letter in the mail two months in advance. If you don't receive a letter, please call our office to schedule the follow-up appointment.   Any Other Special Instructions Will Be Listed Below (If Applicable).  Your physician discussed the importance of regular exercise and recommended that you start or continue a regular exercise program for good health.  Decrease number of calories you are taking in daily.  Eat more lean meats and less fast food.    If you need a refill on your cardiac medications before your next appointment, please call your pharmacy.      Signed, Bryan Grooms, MD  06/26/2016 4:19 PM    [facility] [address], [place], [place]  [address] Phone: [phone]; Fax: [phone]

## 2016-06-26 NOTE — Patient Instructions (Signed)
Medication Instructions:  None  Labwork: TSH today  Testing/Procedures: None  Follow-Up: Your physician wants you to follow-up in: 1 year with Dr. [name].  You will receive a reminder letter in the mail two months in advance. If you don't receive a letter, please call our office to schedule the follow-up appointment.   Any Other Special Instructions Will Be Listed Below (If Applicable).  Your physician discussed the importance of regular exercise and recommended that you start or continue a regular exercise program for good health.  Decrease number of calories you are taking in daily.  Eat more lean meats and less fast food.    If you need a refill on your cardiac medications before your next appointment, please call your pharmacy.

## 2016-06-27 LAB — TSH: TSH: 2.02 u[IU]/mL (ref 0.450–4.500)

## 2016-07-08 DIAGNOSIS — J40 Bronchitis, not specified as acute or chronic: Secondary | ICD-10-CM | POA: Diagnosis not present

## 2016-07-17 ENCOUNTER — Encounter (HOSPITAL_COMMUNITY): Payer: Self-pay | Admitting: *Deleted

## 2016-07-17 ENCOUNTER — Emergency Department (HOSPITAL_COMMUNITY): Payer: Medicare Other

## 2016-07-17 ENCOUNTER — Emergency Department (HOSPITAL_COMMUNITY)
Admission: EM | Admit: 2016-07-17 | Discharge: 2016-07-17 | Disposition: A | Discharge status: Home / Self Care | Payer: Medicare Other | Attending: Emergency Medicine | Admitting: Emergency Medicine

## 2016-07-17 DIAGNOSIS — Z955 Presence of coronary angioplasty implant and graft: Secondary | ICD-10-CM | POA: Diagnosis not present

## 2016-07-17 DIAGNOSIS — E039 Hypothyroidism, unspecified: Secondary | ICD-10-CM | POA: Diagnosis not present

## 2016-07-17 DIAGNOSIS — Z87891 Personal history of nicotine dependence: Secondary | ICD-10-CM | POA: Insufficient documentation

## 2016-07-17 DIAGNOSIS — J449 Chronic obstructive pulmonary disease, unspecified: Secondary | ICD-10-CM | POA: Insufficient documentation

## 2016-07-17 DIAGNOSIS — Z79899 Other long term (current) drug therapy: Secondary | ICD-10-CM | POA: Diagnosis not present

## 2016-07-17 DIAGNOSIS — R740 Nonspecific elevation of levels of transaminase and lactic acid dehydrogenase [LDH]: Secondary | ICD-10-CM | POA: Insufficient documentation

## 2016-07-17 DIAGNOSIS — R069 Unspecified abnormalities of breathing: Secondary | ICD-10-CM | POA: Diagnosis not present

## 2016-07-17 DIAGNOSIS — R7989 Other specified abnormal findings of blood chemistry: Secondary | ICD-10-CM

## 2016-07-17 DIAGNOSIS — R69 Illness, unspecified: Secondary | ICD-10-CM

## 2016-07-17 DIAGNOSIS — R9431 Abnormal electrocardiogram [ECG] [EKG]: Secondary | ICD-10-CM | POA: Diagnosis not present

## 2016-07-17 DIAGNOSIS — E872 Acidosis: Secondary | ICD-10-CM | POA: Diagnosis not present

## 2016-07-17 DIAGNOSIS — R6889 Other general symptoms and signs: Secondary | ICD-10-CM

## 2016-07-17 DIAGNOSIS — Z7982 Long term (current) use of aspirin: Secondary | ICD-10-CM | POA: Diagnosis not present

## 2016-07-17 DIAGNOSIS — I10 Essential (primary) hypertension: Secondary | ICD-10-CM | POA: Diagnosis not present

## 2016-07-17 DIAGNOSIS — J209 Acute bronchitis, unspecified: Secondary | ICD-10-CM | POA: Insufficient documentation

## 2016-07-17 DIAGNOSIS — R05 Cough: Secondary | ICD-10-CM | POA: Diagnosis not present

## 2016-07-17 DIAGNOSIS — D696 Thrombocytopenia, unspecified: Secondary | ICD-10-CM | POA: Insufficient documentation

## 2016-07-17 DIAGNOSIS — I2510 Atherosclerotic heart disease of native coronary artery without angina pectoris: Secondary | ICD-10-CM | POA: Diagnosis not present

## 2016-07-17 DIAGNOSIS — J111 Influenza due to unidentified influenza virus with other respiratory manifestations: Secondary | ICD-10-CM | POA: Diagnosis not present

## 2016-07-17 DIAGNOSIS — R0602 Shortness of breath: Secondary | ICD-10-CM | POA: Diagnosis not present

## 2016-07-17 DIAGNOSIS — D72819 Decreased white blood cell count, unspecified: Secondary | ICD-10-CM | POA: Insufficient documentation

## 2016-07-17 LAB — CBC WITH DIFFERENTIAL/PLATELET
BASOS ABS: 0 10*3/uL (ref 0.0–0.1)
Basophils Relative: 0 %
EOS ABS: 0.1 10*3/uL (ref 0.0–0.7)
Eosinophils Relative: 2 %
HEMATOCRIT: 39.6 % (ref 39.0–52.0)
Hemoglobin: 13.1 g/dL (ref 13.0–17.0)
Lymphocytes Relative: 41 %
Lymphs Abs: 1.2 10*3/uL (ref 0.7–4.0)
MCH: 30.9 pg (ref 26.0–34.0)
MCHC: 33.1 g/dL (ref 30.0–36.0)
MCV: 93.4 fL (ref 78.0–100.0)
MONO ABS: 0.3 10*3/uL (ref 0.1–1.0)
Monocytes Relative: 12 %
NEUTROS ABS: 1.3 10*3/uL — AB (ref 1.7–7.7)
Neutrophils Relative %: 44 %
PLATELETS: 116 10*3/uL — AB (ref 150–400)
RBC: 4.24 MIL/uL (ref 4.22–5.81)
RDW: 13.1 % (ref 11.5–15.5)
WBC: 2.9 10*3/uL — ABNORMAL LOW (ref 4.0–10.5)

## 2016-07-17 LAB — I-STAT CG4 LACTIC ACID, ED: Lactic Acid, Venous: 2.21 mmol/L (ref 0.5–1.9)

## 2016-07-17 LAB — TROPONIN I

## 2016-07-17 LAB — BASIC METABOLIC PANEL
Anion gap: 13 (ref 5–15)
BUN: 15 mg/dL (ref 6–20)
CHLORIDE: 103 mmol/L (ref 101–111)
CO2: 24 mmol/L (ref 22–32)
CREATININE: 0.96 mg/dL (ref 0.61–1.24)
Calcium: 8.5 mg/dL — ABNORMAL LOW (ref 8.9–10.3)
Glucose, Bld: 122 mg/dL — ABNORMAL HIGH (ref 65–99)
POTASSIUM: 4 mmol/L (ref 3.5–5.1)
SODIUM: 140 mmol/L (ref 135–145)

## 2016-07-17 LAB — BRAIN NATRIURETIC PEPTIDE: B Natriuretic Peptide: 11.5 pg/mL (ref 0.0–100.0)

## 2016-07-17 MED ORDER — SODIUM CHLORIDE 0.9 % IV BOLUS (SEPSIS)
1000.0000 mL | Freq: Once | INTRAVENOUS | Status: AC
  Administered 2016-07-17: 1000 mL via INTRAVENOUS

## 2016-07-17 MED ORDER — PREDNISONE 10 MG PO TABS: 40.0000 mg | ORAL_TABLET | Freq: Every day | ORAL | 0 refills | Status: DC

## 2016-07-17 MED ORDER — IPRATROPIUM-ALBUTEROL 0.5-2.5 (3) MG/3ML IN SOLN
3.0000 mL | Freq: Once | RESPIRATORY_TRACT | Status: AC
  Administered 2016-07-17: 3 mL via RESPIRATORY_TRACT
  Filled 2016-07-17: qty 3

## 2016-07-17 MED ORDER — METHYLPREDNISOLONE SODIUM SUCC 125 MG IJ SOLR
125.0000 mg | Freq: Once | INTRAMUSCULAR | Status: AC
  Administered 2016-07-17: 125 mg via INTRAVENOUS
  Filled 2016-07-17: qty 2

## 2016-07-17 NOTE — Discharge Instructions (Signed)
Return for any new or worse symptoms. Currently her lungs are clear chest x-ray was negative for pneumonia. You do have a low white blood cell count and low platelets which will need careful follow-up by your primary care doctor when you're feeling better. Take prednisone as directed to help you with your breathing. Usual albuterol inhaler 2 puffs every 6 hours for the next 7 days.

## 2016-07-17 NOTE — ED Provider Notes (Signed)
Patient turned over to me. Patient was significant improvement here from a breathing standpoint. No further wheezing lungs are clear able to ambulate without desaturating. Able to walk with saturations staying around 92% to 93%.  Chest x-ray negative for pneumonia. 2 concerns on labs one was a leukopenia as well as the platelets being slightly low but no concerns for any bleeding issues based on the level.  Patient has used prednisone in the past he was given Solu-Medrol here. We'll have him continue albuterol inhaler every 6 hours for 7 days take prednisone for the next 5 days.  He will follow-up with his primary care doctor to have his white blood cell count recheck. Patient's symptoms are consistent with the fluids had this for several days. Currently nontoxic no acute distress.  Patient's lactic acid was slightly elevated. Clinically patient markedly improved.   Fredia Sorrow, MD 07/17/16 1251

## 2016-07-17 NOTE — ED Provider Notes (Signed)
[facility] DEPT Provider Note   CSN: [id number] Arrival date & time: 07/17/16  0630     History   Chief Complaint No chief complaint on file.   HPI Bryan LATINI is a 83 y.o. Hatfield.  He has been having dyspnea and cough productive of green sputum over the last week. Dyspnea is getting worse. It is worse with coughing paroxysms and worse with laying flat. He denies fever but has had some chills. He denies sweats. Denies chest pain, heaviness, tightness, pressure. His PCP had prescribed azithromycin, but it does not seem to be helping. He did use his home nebulizer last night which did give some slight relief.   The history is provided by the patient.  Shortness of Breath  Associated symptoms include cough.  Cough  Associated symptoms include shortness of breath.    Past Medical History:  Diagnosis Date  . Acute respiratory failure ([place]) 09/25/11   hypoxic  . Anxiety   . CAD (coronary artery disease)    a. PTCA 2001. b. f/u cath 05/2012 heavy calcification, no PCI required. c. f/u cath for recurrent sx 2017: stable mod CAD unchanged from prior, mild progression of small vessel disease, elevated LVEDP.  . Colon polyps   . COPD (chronic obstructive pulmonary disease) ([place])   . GERD (gastroesophageal reflux disease)   . High cholesterol   . Hypertension   . Peripheral vascular disease ([place])    patient denies knowledge of, but listed in his chart  . Pleural effusion    Parapneumonic ?r/t PNA s/p thoracentesis 2008  . Pneumonia    hx  . Sleep apnea    cpap     Patient Active Problem List   Diagnosis Date Noted  . Cough 04/21/2016  . Dyspnea 05/29/2015  . Insomnia 02/19/2015  . GAD (generalized anxiety disorder) 11/21/2014  . Erythrasma 09/18/2014  . Weakness 09/17/2014  . Arterial hypotension 09/17/2014  . Subclinical hypothyroidism 09/07/2014  . Prediabetes 06/08/2013  . Essential hypertension, benign 06/02/2013  . Hyperlipidemia 06/02/2013  . Depression  06/02/2013  . CAD S/P percutaneous coronary angioplasty 05/09/2012  . Morbid obesity ([place]) 09/25/2011  . COPD GOLD 0/I 09/21/2007  . Sleep apnea 09/20/2007    Past Surgical History:  Procedure Laterality Date  . CARDIAC CATHETERIZATION N/A Bryan/26/2017   Procedure: Left Heart Cath and Coronary Angiography;  Surgeon: Leonie Man, MD;  Location: [facility];  Service: Cardiovascular;  Laterality: N/A;  . CATARACT EXTRACTION W/ INTRAOCULAR LENS  IMPLANT, BILATERAL  ~ 2006  . CHOLECYSTECTOMY N/A 10/11/2012   Procedure: LAPAROSCOPIC CHOLECYSTECTOMY WITH INTRAOPERATIVE CHOLANGIOGRAM;  Surgeon: Adin Hector, MD;  Location: [facility];  Service: General;  Laterality: N/A;  . CORONARY ANGIOPLASTY  01/2000  . HEMORRHOID SURGERY  1960's       Home Medications    Prior to Admission medications   Medication Sig Start Date End Date Taking? Authorizing Provider  albuterol (PROVENTIL) (2.5 MG/3ML) 0.083% nebulizer solution Take 2.5 mg by nebulization every Bryan (six) hours as needed for wheezing. Breathing treatment 10/06/15   Historical Provider, MD  Ascorbic Acid (VITAMIN C PO) Take 1 tablet by mouth daily.    Historical Provider, MD  aspirin EC 81 MG tablet Take 81 mg by mouth daily.      Historical Provider, MD  atorvastatin (LIPITOR) 40 MG tablet TAKE 1 TABLET BY MOUTH AT  BEDTIME 04/30/16   [name], MD  escitalopram (LEXAPRO) 10 MG tablet Take 1 tablet (10 mg total)  by mouth daily. NEED FOLLOW UP APPOINTMENT FOR MORE REFILLS 04/21/16   [name], MD  fluticasone [facility]) 50 MCG/ACT nasal spray One spray in each nostril twice a day, use left hand for right nostril, and right hand for left nostril. 04/21/16   Silverio Decamp, MD  furosemide (LASIX) 40 MG tablet TAKE 1 TABLET BY MOUTH  EVERY OTHER DAY USUALLY ON  MONDAY,WEDNESDAY,FRIDAY 01/21/16   [name], MD  isosorbide mononitrate (IMDUR) 30 MG 24 hr tablet Take 30 mg by mouth daily.    Historical  Provider, MD  levothyroxine (SYNTHROID, LEVOTHROID) 112 MCG tablet Take 1 tablet (112 mcg total) by mouth daily before breakfast. NEED LAB WORK FOR MORE REFILLS 06/02/16   [name], MD  Multiple Vitamins-Minerals (ICAPS MV PO) Take 1 capsule by mouth daily.    Historical Provider, MD  Nebulizers (COMPRESSOR/NEBULIZER) MISC by Does not apply route. 10/06/15   Historical Provider, MD  NITROSTAT 0.4 MG SL tablet DISSOLVE 1 TABLET UNDER THE TONGUE EVERY 5 MINUTES AS  NEEDED CHEST PAIN 03/10/16   [name], MD  triamcinolone cream (KENALOG) 0.1 % APPLY EXTERNALLY TO THE AFFECTED AREA DAILY FOR UP TO 2 WEEKS AS NEEDED. AVOID FACE 04/28/16   [name], MD  VESICARE 5 MG tablet Take 5 mg by mouth daily. 06/12/16   Historical Provider, MD  zolpidem (AMBIEN) 5 MG tablet Take 1 tablet (5 mg total) by mouth at bedtime as needed for sleep. Ok to refill 06/01/2016 04/28/16   [name], MD    Family History Family History  Problem Relation Age of Onset  . Stroke Mother   . Arthritis Mother   . Asthma Mother   . Emphysema Mother   . Stroke Father   . Arthritis Sister   . Stroke Sister   . Heart attack Neg Hx     Social History Social History  Substance Use Topics  . Smoking status: Former Smoker    Packs/day: 1.00    Years: 10.00    Types: Cigarettes    Quit date: 06/01/1982  . Smokeless tobacco: Never Used     Comment: "quit smoking ~ 25 years ago; smoked ~ 1 pk/wk"  . Alcohol use No     Allergies   Patient has no known allergies.   Review of Systems Review of Systems  Respiratory: Positive for cough and shortness of breath.   All other systems reviewed and are negative.    Physical Exam Updated Vital Signs BP 134/69 (BP Location: Right Arm)   Pulse 70   Temp 97.8 F (36.Bryan C) (Oral)   Resp 18   Ht 5\' 11"  (1.803 m)   Wt 268 lb (121.Bryan kg)   SpO2 93%   BMI 37.38 kg/m   Physical Exam  Nursing note and vitals reviewed.  83 year old Hatfield,  resting comfortably and in no acute distress. Vital signs are normal. Oxygen saturation is 93%, which is normal. Head is normocephalic and atraumatic. PERRLA, EOMI. Oropharynx is clear. Neck is nontender and supple without adenopathy or JVD. Back is nontender and there is no CVA tenderness. Lungs have bibasilar rales and scattered expiratory wheezes. There are no rhonchi. Chest is nontender. Heart has regular rate and rhythm without murmur. Abdomen is soft, flat, nontender without masses or hepatosplenomegaly and peristalsis is normoactive. Extremities have trace edema, full range of motion is present. Moderate venous stasis changes are present. Skin is warm and dry without rash. Neurologic: Mental  status is normal, cranial nerves are intact, there are no motor or sensory deficits.  ED Treatments / Results  Labs (all labs ordered are listed, but only abnormal results are displayed) Labs Reviewed  BASIC METABOLIC PANEL - Abnormal; Notable for the following:       Result Value   Glucose, Bld 122 (*)    Calcium 8.5 (*)    All other components within normal limits  CBC WITH DIFFERENTIAL/PLATELET - Abnormal; Notable for the following:    WBC 2.9 (*)    Platelets 116 (*)    Neutro Abs 1.3 (*)    All other components within normal limits  I-STAT CG4 LACTIC ACID, ED - Abnormal; Notable for the following:    Lactic Acid, Venous 2.21 (*)    All other components within normal limits  BRAIN NATRIURETIC PEPTIDE  TROPONIN I    EKG  EKG Interpretation  Date/Time:  Friday July 17 2016 06:38:34 EST Ventricular Rate:  71 PR Interval:    QRS Duration: 102 QT Interval:  392 QTC Calculation: 426 R Axis:   -64 Text Interpretation:  Sinus rhythm Left anterior fascicular block Abnormal R-wave progression, late transition When compared with ECG of 05/09/2012, No significant change was found Confirmed by [facility]  MD, Shey Bartmess ([id number]) on 07/17/2016 Bryan:46:34 AM       Radiology Dg Chest 2  View  Result Date: 07/17/2016 CLINICAL DATA:  Productive cough and chest congestion over the last several weeks. Shortness of breath this morning. EXAM: CHEST  2 VIEW COMPARISON:  03/13/2016 FINDINGS: Heart size is at the upper limits of normal. Mediastinal shadows are normal except for mild aortic tortuosity. The lungs are clear. The pulmonary vascularity is normal. No effusions. Ordinary degenerative changes affect the spine. IMPRESSION: No active cardiopulmonary disease. Electronically Signed   By: Nelson Chimes M.D.   On: 07/17/2016 07:15    Procedures Procedures (including critical care time)  Medications Ordered in ED Medications  ipratropium-albuterol (DUONEB) 0.5-2.5 (3) MG/3ML nebulizer solution 3 mL (not administered)     Initial Impression / Assessment and Plan / ED Course  I have reviewed the triage vital signs and the nursing notes.  Pertinent labs & imaging results that were available during my care of the patient were reviewed by me and considered in my medical decision making (see chart for details).  Respiratory tract infection which may be influenza. His outside the window for antiviral treatment. He is felt to respond to outpatient azithromycin. Will check chest x-ray, BNP and give albuterol with ipratropium via nebulizer. Old records are reviewed, and he does have a history of diastolic heart failure. Will hold on decision regarding steroids until after chest x-ray is obtained.  Chest x-ray shows no evidence of pneumonia. BNP has come back normal. Lactic acid has come back slightly elevated and he is given some IV fluids and also IV steroids. After nebulizer treatment, he states he feels better but there is still significant wheezing. Oxygen saturation is at 92% at rest. He will be given additional albuterol with ipratropium. Case is signed out to Dr. Rogene Houston.  Final Clinical Impressions(s) / ED Diagnoses   Final diagnoses:  Influenza-like illness  Bronchitis with  bronchospasm  Elevated lactic acid level  Leukopenia, unspecified type  Thrombocytopenia (HCC)    New Prescriptions New Prescriptions   No medications on file     Delora Fuel, MD [id number]

## 2016-07-17 NOTE — ED Notes (Signed)
Patient c/o sob and productive cough x 1 week. States he just finished a Z-pak however he doesn't feel like he is getting any better. Able to speak in complete sentences. Lungs with bilateral exp. Wheezing. Son at bedside.

## 2016-07-17 NOTE — ED Notes (Signed)
Pt ambulated in hallway while attached to pulse oximetry; O2 saturation fluctuated between 93% and 95% on room air

## 2016-07-17 NOTE — ED Notes (Signed)
Pt given [place] sandwich and ginger ale per Estill Batten, RN

## 2016-07-27 ENCOUNTER — Encounter: Payer: Self-pay | Admitting: *Deleted

## 2016-07-27 ENCOUNTER — Emergency Department
Admission: EM | Admit: 2016-07-27 | Discharge: 2016-07-27 | Disposition: A | Discharge status: Home / Self Care | Payer: Medicare Other | Source: Home / Self Care | Attending: Family Medicine | Admitting: Family Medicine

## 2016-07-27 ENCOUNTER — Ambulatory Visit: Payer: Medicare Other | Admitting: Family Medicine

## 2016-07-27 DIAGNOSIS — R05 Cough: Secondary | ICD-10-CM

## 2016-07-27 DIAGNOSIS — R059 Cough, unspecified: Secondary | ICD-10-CM

## 2016-07-27 DIAGNOSIS — J441 Chronic obstructive pulmonary disease with (acute) exacerbation: Secondary | ICD-10-CM | POA: Diagnosis not present

## 2016-07-27 LAB — POCT CBC W AUTO DIFF (K'VILLE URGENT CARE)

## 2016-07-27 LAB — COMPLETE METABOLIC PANEL WITH GFR
ALK PHOS: 73 U/L (ref 40–115)
ALT: 36 U/L (ref 9–46)
AST: 25 U/L (ref 10–35)
Albumin: 3.6 g/dL (ref 3.6–5.1)
BILIRUBIN TOTAL: 0.6 mg/dL (ref 0.2–1.2)
BUN: 24 mg/dL (ref 7–25)
CO2: 31 mmol/L (ref 20–31)
CREATININE: 1.37 mg/dL — AB (ref 0.70–1.11)
Calcium: 8.6 mg/dL (ref 8.6–10.3)
Chloride: 102 mmol/L (ref 98–110)
GFR, EST AFRICAN AMERICAN: 53 mL/min — AB (ref >=60)
GFR, EST NON AFRICAN AMERICAN: 46 mL/min — AB (ref >=60)
GLUCOSE: 118 mg/dL — AB (ref 65–99)
Potassium: 4.2 mmol/L (ref 3.5–5.3)
Sodium: 140 mmol/L (ref 135–146)
TOTAL PROTEIN: 5.7 g/dL — AB (ref 6.1–8.1)

## 2016-07-27 MED ORDER — BENZONATATE 200 MG PO CAPS: ORAL_CAPSULE | ORAL | 0 refills | Status: DC

## 2016-07-27 NOTE — ED Triage Notes (Addendum)
Patient went to [facility] ER on 07/17/16 for cough, weakness and diarrhea. CXR negative for PNA. He was advised to f/u with PCP. He was unable to see Dr. Madilyn Fireman today. He reports he is minimally improved.

## 2016-07-27 NOTE — ED Provider Notes (Signed)
[facility]    CSN: [id number] Arrival date & time: 07/27/16  1043     History   Chief Complaint Chief Complaint  Patient presents with  . Cough  . Follow-up    HPI Bryan Hatfield is a 83 y.o. male.   Patient presents for an ER follow-up visit. He had presented to the [facility] ED ten days ago, about one week after developing flu symptoms.  His chest X-ray at that time was negative and he was treated with IV fluids, Solu-Medrol, and discharged on a prednisone burst for 5 days.  At time of discharge patient was nontoxic, in no distress, and able to walk with saturations remaining around 92% to 93%.  A concern during his ER visit was leukopenia (WBC 2.9) and thrombocytopenia (platelets 116).  He also had slightly elevated lactic acid (2.21).  He was advised to follow-up with his PCP in one week.  Patient states that he was unable to see his PCP today, so presents for followup in Urgent Care.  He states that he is minimally improved and has a persistent non-productive cough, worse at night.  No pleuritic pain.  No fevers, chills, and sweats.  He complains of wheezing and shortness of breath with activity, improved when he uses his albuterol inhaler (he normally uses his inhaler about 2 times daily when well).  He also uses CPAP at night.  He denies increase in lower leg swelling.  No PND.            The history is provided by the patient and a relative.    Past Medical History:  Diagnosis Date  . Acute respiratory failure ([place]) 09/25/11   hypoxic  . Anxiety   . CAD (coronary artery disease)    a. PTCA 2001. b. f/u cath 05/2012 heavy calcification, no PCI required. c. f/u cath for recurrent sx 2017: stable mod CAD unchanged from prior, mild progression of small vessel disease, elevated LVEDP.  . Colon polyps   . COPD (chronic obstructive pulmonary disease) ([place])   . GERD (gastroesophageal reflux disease)   . High cholesterol   . Hypertension   . Peripheral vascular  disease ([place])    patient denies knowledge of, but listed in his chart  . Pleural effusion    Parapneumonic ?r/t PNA s/p thoracentesis 2008  . Pneumonia    hx  . Sleep apnea    cpap     Patient Active Problem List   Diagnosis Date Noted  . Cough 04/21/2016  . Dyspnea 05/29/2015  . Insomnia 02/19/2015  . GAD (generalized anxiety disorder) 11/21/2014  . Erythrasma 09/18/2014  . Weakness 09/17/2014  . Arterial hypotension 09/17/2014  . Subclinical hypothyroidism 09/07/2014  . Prediabetes 06/08/2013  . Essential hypertension, benign 06/02/2013  . Hyperlipidemia 06/02/2013  . Depression 06/02/2013  . CAD S/P percutaneous coronary angioplasty 05/09/2012  . Morbid obesity ([place]) 09/25/2011  . COPD GOLD 0/I 09/21/2007  . Sleep apnea 09/20/2007    Past Surgical History:  Procedure Laterality Date  . CARDIAC CATHETERIZATION N/A 11/25/2015   Procedure: Left Heart Cath and Coronary Angiography;  Surgeon: Leonie Man, MD;  Location: [facility];  Service: Cardiovascular;  Laterality: N/A;  . CATARACT EXTRACTION W/ INTRAOCULAR LENS  IMPLANT, BILATERAL  ~ 2006  . CHOLECYSTECTOMY N/A 10/11/2012   Procedure: LAPAROSCOPIC CHOLECYSTECTOMY WITH INTRAOPERATIVE CHOLANGIOGRAM;  Surgeon: Adin Hector, MD;  Location: [facility];  Service: General;  Laterality: N/A;  . CORONARY ANGIOPLASTY  01/2000  .  HEMORRHOID SURGERY  1960's       Home Medications    Prior to Admission medications   Medication Sig Start Date End Date Taking? Authorizing Provider  albuterol (PROVENTIL) (2.5 MG/3ML) 0.083% nebulizer solution Take 2.5 mg by nebulization every 6 (six) hours as needed for wheezing. Breathing treatment 10/06/15   Historical Provider, MD  Ascorbic Acid (VITAMIN C PO) Take 1 tablet by mouth daily.    Historical Provider, MD  aspirin EC 81 MG tablet Take 81 mg by mouth daily.      Historical Provider, MD  atorvastatin (LIPITOR) 40 MG tablet TAKE 1 TABLET BY MOUTH AT  BEDTIME 04/30/16    [name], MD  benzonatate (TESSALON) 200 MG capsule Take one cap by mouth at bedtime as needed for cough.  May repeat in 4 to 6 hours 07/27/16   Kandra Nicolas, MD  escitalopram (LEXAPRO) 10 MG tablet Take 1 tablet (10 mg total) by mouth daily. NEED FOLLOW UP APPOINTMENT FOR MORE REFILLS 04/21/16   [name], MD  fluticasone [facility]) 50 MCG/ACT nasal spray One spray in each nostril twice a day, use left hand for right nostril, and right hand for left nostril. 04/21/16   Silverio Decamp, MD  furosemide (LASIX) 40 MG tablet TAKE 1 TABLET BY MOUTH  EVERY OTHER DAY USUALLY ON  MONDAY,WEDNESDAY,FRIDAY 01/21/16   [name], MD  isosorbide mononitrate (IMDUR) 30 MG 24 hr tablet Take 30 mg by mouth daily.    Historical Provider, MD  levothyroxine (SYNTHROID, LEVOTHROID) 112 MCG tablet Take 1 tablet (112 mcg total) by mouth daily before breakfast. NEED LAB WORK FOR MORE REFILLS 06/02/16   [name], MD  Multiple Vitamins-Minerals (ICAPS MV PO) Take 1 capsule by mouth daily.    Historical Provider, MD  Nebulizers (COMPRESSOR/NEBULIZER) MISC by Does not apply route. 10/06/15   Historical Provider, MD  NITROSTAT 0.4 MG SL tablet DISSOLVE 1 TABLET UNDER THE TONGUE EVERY 5 MINUTES AS  NEEDED CHEST PAIN 03/10/16   [name], MD  triamcinolone cream (KENALOG) 0.1 % APPLY EXTERNALLY TO THE AFFECTED AREA DAILY FOR UP TO 2 WEEKS AS NEEDED. AVOID FACE 04/28/16   [name], MD  VESICARE 5 MG tablet Take 5 mg by mouth daily. 06/12/16   Historical Provider, MD  zolpidem (AMBIEN) 5 MG tablet Take 1 tablet (5 mg total) by mouth at bedtime as needed for sleep. Ok to refill 06/01/2016 04/28/16   [name], MD    Family History Family History  Problem Relation Age of Onset  . Stroke Mother   . Arthritis Mother   . Asthma Mother   . Emphysema Mother   . Stroke Father   . Arthritis Sister   . Stroke Sister   . Heart attack Neg Hx     Social  History Social History  Substance Use Topics  . Smoking status: Former Smoker    Packs/day: 1.00    Years: 10.00    Types: Cigarettes    Quit date: 06/01/1982  . Smokeless tobacco: Never Used     Comment: "quit smoking ~ 25 years ago; smoked ~ 1 pk/wk"  . Alcohol use No     Allergies   Patient has no known allergies.   Review of Systems Review of Systems No sore throat + cough No pleuritic pain No chest pain + wheezing No nasal congestion ? post-nasal drainage No sinus pain/pressure No itchy/red eyes No earache No hemoptysis + SOB  with activity No fever/chills No nausea No vomiting No abdominal pain No diarrhea No urinary symptoms No skin rash + fatigue No myalgias No headache    Physical Exam Triage Vital Signs ED Triage Vitals  Enc Vitals Group     BP 07/27/16 1117 112/67     Pulse Rate 07/27/16 1117 90     Resp 07/27/16 1117 (!) 80     Temp 07/27/16 1117 98.1 F (36.7 C)     Temp Source 07/27/16 1117 Oral     SpO2 07/27/16 1117 94 %     Weight --      Height --      Head Circumference --      Peak Flow --      Pain Score 07/27/16 1118 0     Pain Loc --      Pain Edu? --      Excl. in [place]? --    No data found.   Updated Vital Signs BP 112/67 (BP Location: Left Arm)   Pulse 90   Temp 98.1 F (36.7 C) (Oral)   Resp (!) 80   SpO2 94%   Visual Acuity Right Eye Distance:   Left Eye Distance:   Bilateral Distance:    Right Eye Near:   Left Eye Near:    Bilateral Near:     Physical Exam Nursing notes and Vital Signs reviewed. Appearance:  Patient appears stated age, and in no acute distress.  He appears comfortable and alert. Eyes:  Pupils are equal, round, and reactive to light and accomodation.  Extraocular movement is intact.  Conjunctivae are not inflamed  Ears:  Canals normal.  Tympanic membranes normal.  Nose:  Mildly congested turbinates.  No sinus tenderness.   Pharynx:  Normal; moist mucous membranes  Neck:  Supple.  Slightly  tender enlarged posterior/lateral nodes are palpated bilaterally  Lungs:  Clear to auscultation.  Breath sounds are equal.  Moving air well. Heart:  Regular rate and rhythm without murmurs, rubs, or gallops.  Abdomen:  Nontender without masses or hepatosplenomegaly.  Bowel sounds are present.  No CVA or flank tenderness.  Extremities:  No edema.  Skin:  No rash present.    UC Treatments / Results  Labs (all labs ordered are listed, but only abnormal results are displayed) Labs Reviewed  COMPLETE METABOLIC PANEL WITH GFR    LACTIC ACID, PLASMA  POCT CBC W AUTO DIFF ([facility]):  WBC 7.7; LY 18.9; MO 3.9; GR 77.2; Hgb 13.8; Platelets 250     EKG  EKG Interpretation None       Radiology No results found.  Procedures Procedures (including critical care time)  Medications Ordered in UC Medications - No data to display   Initial Impression / Assessment and Plan / UC Course  I have reviewed the triage vital signs and the nursing notes.  Pertinent labs & imaging results that were available during my care of the patient were reviewed by me and considered in my medical decision making (see chart for details).    Normal WBC (7.7) and platelet count (250) reassuring.  Note normal Hgb also. Lactic acid and CMP pending. Suspect resolved influenza with post-infectious cough. Note that patient normally uses his albuterol inhaler about twice daily when well; will begin trial of Spiriva Respimat 2 puffs daily (sample given) Prescription written for Benzonatate (Tessalon) to take at bedtime for night-time cough.  Take plain guaifenesin (1200mg  extended release tabs such as Mucinex) twice daily, with plenty  of water, for cough and congestion.    May continue albuterol inhaler as needed. Followup with Family Doctor in one week.    Final Clinical Impressions(s) / UC Diagnoses   Final diagnoses:  Cough  COPD exacerbation ([place])    New Prescriptions Discharge Medication  List as of 07/27/2016 12:38 PM    START taking these medications   Details  benzonatate (TESSALON) 200 MG capsule Take one cap by mouth at bedtime as needed for cough.  May repeat in 4 to 6 hours, Normal         Kandra Nicolas, MD 07/27/16 [phone]

## 2016-07-27 NOTE — Discharge Instructions (Signed)
Take plain guaifenesin (1200mg  extended release tabs such as Mucinex) twice daily, with plenty of water, for cough and congestion.    Begin Spiriva Respimat, two inhalations once daily. May continue albuterol inhaler as needed.

## 2016-07-29 ENCOUNTER — Telehealth: Payer: Self-pay | Admitting: *Deleted

## 2016-07-29 NOTE — Telephone Encounter (Signed)
LM with lab results, increase fluids, keep f/u appt with dr Madilyn Fireman, and call back if he has any questions or concerns. Per Iona Beard at [facility] lactic acid test is only run on Monday, wednesday and Friday with a 5 day turn around time for results. LM with pt about pending lactic acid test.

## 2016-07-30 LAB — LACTIC ACID, PLASMA: LACTIC ACID: 15 mg/dL (ref 4–16)

## 2016-07-31 ENCOUNTER — Telehealth: Payer: Self-pay | Admitting: Emergency Medicine

## 2016-07-31 NOTE — Telephone Encounter (Signed)
Lactic acid is in the normal range, I will put in the mail, please follow up with Dr Eustaquio Boyden

## 2016-08-03 ENCOUNTER — Encounter: Payer: Self-pay | Admitting: Family Medicine

## 2016-08-03 ENCOUNTER — Ambulatory Visit (INDEPENDENT_AMBULATORY_CARE_PROVIDER_SITE_OTHER): Payer: Medicare Other | Admitting: Family Medicine

## 2016-08-03 VITALS — BP 137/51 | HR 80 | Temp 97.4°F | Wt 271.0 lb

## 2016-08-03 DIAGNOSIS — J440 Chronic obstructive pulmonary disease with acute lower respiratory infection: Secondary | ICD-10-CM

## 2016-08-03 DIAGNOSIS — L081 Erythrasma: Secondary | ICD-10-CM | POA: Diagnosis not present

## 2016-08-03 DIAGNOSIS — J209 Acute bronchitis, unspecified: Secondary | ICD-10-CM | POA: Diagnosis not present

## 2016-08-03 DIAGNOSIS — B356 Tinea cruris: Secondary | ICD-10-CM

## 2016-08-03 DIAGNOSIS — J111 Influenza due to unidentified influenza virus with other respiratory manifestations: Secondary | ICD-10-CM

## 2016-08-03 DIAGNOSIS — R69 Illness, unspecified: Secondary | ICD-10-CM

## 2016-08-03 MED ORDER — FLUCONAZOLE 150 MG PO TABS: 150.0000 mg | ORAL_TABLET | ORAL | 0 refills | Status: DC

## 2016-08-03 MED ORDER — NYSTATIN 100000 UNIT/GM EX POWD: Freq: Four times a day (QID) | CUTANEOUS | 99 refills | Status: DC

## 2016-08-03 NOTE — Progress Notes (Signed)
  Subjective:    CC: F/u flu sxs from UC.   HPI:  83 year old male follows up today for recent visit to urgent care. He was actually seen in the emergency department initially on February 14 for influenza-like illness. At that time his chest x-ray was negative for pneumonia and he was on course of prednisone. His white blood cell count was also low at the time at 2.9. He was encouraged to follow with the PCP. Then on February 26 he went back to urgent care for COPD exacerbation. There his white blood cell count and platelets were back up to normal. He was started on Spiriva.  He says overall he feels about 80% better. Still some occasional cough and shortness of breath but otherwise feels much better. He did complete the prednisone. He feels like his appetite is normal.  He did want to discuss the rash underneath his axilla and underneath the breast area particularly on the right side. He's is been very itchy and he's been using a steroid cream, triamcinolone on it. If this is really just not getting a lot better. He also has a groin rash bilaterally.  He also reports that he sweats excessively. He says it's been going on for several years. He first noticed it back in 2001 when he had his first heart catheterization and then it resolved. He actually recently went to his cardiologist and recently had a catheterization which was essentially normal so he's not sure why he feels like he sweating excessively. He also just had his thyroid checked about 6 weeks ago and it was perfect at 2.0.  Past medical history, Surgical history, Family history not pertinant except as noted below, Social history, Allergies, and medications have been entered into the medical record, reviewed, and corrections made.   Review of Systems: No fevers, chills, night sweats, weight loss, chest pain, or shortness of breath.   Objective:    General: Well Developed, well nourished, and in no acute distress.  Neuro: Alert and  oriented x3, extra-ocular muscles intact, sensation grossly intact.  HEENT: Normocephalic, atraumatic  Skin: Warm and dry, no rashes. Has erythematous macular area crescent shaped under the right breast with excoriations.  It look wet and macerated.   Cardiac: Regular rate and rhythm, no murmurs rubs or gallops, no lower extremity edema.  Respiratory: Clear to auscultation bilaterally. Not using accessory muscles, speaking in full sentences.   Impression and Recommendations:    COPD - Stable. Continue current regimen. He is on inhaler through the [place] but just can't move her the name of it. I did encourage him to call me back in the next day or 2 and let me know the name so that we can get added to his chart.  Diaphoresis - unclear etiology. Since he recently had a catheterization that was normal and it really should not be his heart that's causing it. Thyroid is well regulated.  Tinea cruris- treat with oral Diflucan every third day along with the triamcinolone.  Erythrasma - will tx with nystatin powder and diflucan pills.

## 2016-08-20 ENCOUNTER — Other Ambulatory Visit: Payer: Self-pay | Admitting: Family Medicine

## 2016-08-20 ENCOUNTER — Other Ambulatory Visit: Payer: Self-pay | Admitting: *Deleted

## 2016-08-27 ENCOUNTER — Other Ambulatory Visit: Payer: Self-pay | Admitting: *Deleted

## 2016-08-27 MED ORDER — ALBUTEROL SULFATE (2.5 MG/3ML) 0.083% IN NEBU: 2.5000 mg | INHALATION_SOLUTION | Freq: Four times a day (QID) | RESPIRATORY_TRACT | 11 refills | Status: DC | PRN

## 2016-08-31 ENCOUNTER — Other Ambulatory Visit: Payer: Self-pay

## 2016-11-11 ENCOUNTER — Other Ambulatory Visit: Payer: Self-pay | Admitting: Family Medicine

## 2016-11-11 DIAGNOSIS — L309 Dermatitis, unspecified: Secondary | ICD-10-CM

## 2016-11-20 ENCOUNTER — Encounter: Payer: Self-pay | Admitting: Osteopathic Medicine

## 2016-11-20 ENCOUNTER — Ambulatory Visit (INDEPENDENT_AMBULATORY_CARE_PROVIDER_SITE_OTHER): Payer: Medicare Other | Admitting: Osteopathic Medicine

## 2016-11-20 ENCOUNTER — Other Ambulatory Visit: Payer: Self-pay | Admitting: Osteopathic Medicine

## 2016-11-20 ENCOUNTER — Ambulatory Visit (INDEPENDENT_AMBULATORY_CARE_PROVIDER_SITE_OTHER): Payer: Medicare Other

## 2016-11-20 VITALS — BP 122/70 | HR 64 | Ht 71.0 in | Wt 267.0 lb

## 2016-11-20 DIAGNOSIS — J209 Acute bronchitis, unspecified: Secondary | ICD-10-CM

## 2016-11-20 DIAGNOSIS — R05 Cough: Secondary | ICD-10-CM

## 2016-11-20 DIAGNOSIS — R059 Cough, unspecified: Secondary | ICD-10-CM

## 2016-11-20 DIAGNOSIS — R0602 Shortness of breath: Secondary | ICD-10-CM | POA: Diagnosis not present

## 2016-11-20 DIAGNOSIS — J440 Chronic obstructive pulmonary disease with acute lower respiratory infection: Secondary | ICD-10-CM | POA: Diagnosis not present

## 2016-11-20 MED ORDER — GUAIFENESIN-CODEINE 100-10 MG/5ML PO SOLN: 5.0000 mL | Freq: Four times a day (QID) | ORAL | 0 refills | Status: DC | PRN

## 2016-11-20 MED ORDER — IPRATROPIUM-ALBUTEROL 0.5-2.5 (3) MG/3ML IN SOLN: 3.0000 mL | RESPIRATORY_TRACT | 3 refills | Status: DC | PRN

## 2016-11-20 MED ORDER — PREDNISONE 20 MG PO TABS: 20.0000 mg | ORAL_TABLET | Freq: Two times a day (BID) | ORAL | 0 refills | Status: DC

## 2016-11-20 MED ORDER — LEVOFLOXACIN 500 MG PO TABS: 500.0000 mg | ORAL_TABLET | Freq: Every day | ORAL | 0 refills | Status: DC

## 2016-11-20 NOTE — Progress Notes (Signed)
HPI: Bryan Hatfield is a 83 y.o. male  who presents to [facility] today, 11/20/16,  for chief complaint of:  Chief Complaint  Patient presents with  . Cough   History of COPD, states gets bronchitis about twice per year. Evaluated last week at the [place], no records currently available. Patient states he was diagnosed with bronchitis, put on amoxicillin antibiotic, no inhaled medications. Now complaining of worsening cough with sputum production, no fever/chills, some increased shortness of breath with exertion    Past medical history, surgical history, social history and family history reviewed.  Patient Active Problem List   Diagnosis Date Noted  . Insomnia 02/19/2015  . GAD (generalized anxiety disorder) 11/21/2014  . Erythrasma 09/18/2014  . Weakness 09/17/2014  . Arterial hypotension 09/17/2014  . Subclinical hypothyroidism 09/07/2014  . Prediabetes 06/08/2013  . Essential hypertension, benign 06/02/2013  . Hyperlipidemia 06/02/2013  . Depression 06/02/2013  . CAD S/P percutaneous coronary angioplasty 05/09/2012  . Morbid obesity ([place]) 09/25/2011  . COPD GOLD 0/I 09/21/2007  . Sleep apnea 09/20/2007    Current medication list and allergy/intolerance information reviewed.    Patient states also taking Spiriva, not 100% sure on this, we reviewed posterior with photographs of different inhaled medications and he thinks this is what he is using Current Outpatient Prescriptions on File Prior to Visit  Medication Sig Dispense Refill  . albuterol (PROVENTIL) (2.5 MG/3ML) 0.083% nebulizer solution Take 3 mLs (2.5 mg total) by nebulization every 6 (six) hours as needed for wheezing. Breathing treatment 75 mL 11  . Ascorbic Acid (VITAMIN C PO) Take 1 tablet by mouth daily.    Marland Kitchen aspirin EC 81 MG tablet Take 81 mg by mouth daily.      Marland Kitchen atorvastatin (LIPITOR) 40 MG tablet TAKE 1 TABLET BY MOUTH AT  BEDTIME 90 tablet 1  . escitalopram (LEXAPRO) 10 MG  tablet TAKE 1 TABLET BY MOUTH  DAILY 90 tablet 1  . fluticasone (FLONASE) 50 MCG/ACT nasal spray One spray in each nostril twice a day, use left hand for right nostril, and right hand for left nostril. 48 g 3  . furosemide (LASIX) 40 MG tablet TAKE 1 TABLET BY MOUTH  EVERY OTHER DAY USUALLY ON  MONDAY,WEDNESDAY,FRIDAY 45 tablet 1  . isosorbide mononitrate (IMDUR) 30 MG 24 hr tablet Take 30 mg by mouth daily.    Marland Kitchen levothyroxine (SYNTHROID, LEVOTHROID) 112 MCG tablet TAKE 1 TABLET BY MOUTH  DAILY BEFORE BREAKFAST 90 tablet 1  . Multiple Vitamins-Minerals (ICAPS MV PO) Take 1 capsule by mouth daily.    . Nebulizers (COMPRESSOR/NEBULIZER) MISC by Does not apply route.    Marland Kitchen NITROSTAT 0.4 MG SL tablet DISSOLVE 1 TABLET UNDER THE TONGUE EVERY 5 MINUTES AS  NEEDED CHEST PAIN 50 tablet 1  . nystatin (MYCOSTATIN/NYSTOP) powder Apply topically 4 (four) times daily. 57 g PRN  . triamcinolone cream (KENALOG) 0.1 % APPLY EXTERNALLY TO THE AFFECTED AREA DAILY FOR UP TO 2 WEEKS AS NEEDED. AVOID FACE 80 g 0  . VESICARE 5 MG tablet Take 5 mg by mouth daily.    Marland Kitchen zolpidem (AMBIEN) 5 MG tablet Take 1 tablet (5 mg total) by mouth at bedtime as needed for sleep. Ok to refill 06/01/2016 90 tablet 0   No current facility-administered medications on file prior to visit.    No Known Allergies    Review of Systems:  Constitutional: + recent illness as per history of present illness  HEENT: No  headache, no vision change, +sinus drainage  Cardiac: No  chest pain, No  pressure, No palpitations  Respiratory:  +shortness of breath. +Cough  Gastrointestinal: No  abdominal pain  Musculoskeletal: No new myalgia/arthralgia  Skin: No  Rash  Neurologic: No  weakness, No  Dizziness    Exam:  BP 122/70   Pulse 64   Ht 5\' 11"  (1.803 m)   Wt 267 lb (121.1 kg)   SpO2 97%   BMI 37.24 kg/m   Constitutional: VS see above. General Appearance: alert, well-developed, well-nourished, NAD  Eyes: Normal lids and  conjunctive, non-icteric sclera  Ears, Nose, Mouth, Throat: MMM, Normal external inspection ears/nares/mouth/lips/gums.  Neck: No masses, trachea midline.   Respiratory: Normal respiratory effort. no wheeze, no rhonchi, + very slight rales at bases of lungs bilaterally  Cardiovascular: S1/S2 normal, no murmur, no rub/gallop auscultated. RRR.   Musculoskeletal: Gait normal. Symmetric and independent movement of all extremities  Neurological: Normal balance/coordination. No tremor.  Skin: warm, dry, intact.   Psychiatric: Normal judgment/insight. Normal mood and affect. Oriented x3.      Chest x-ray personally reviewed: Cardiomegaly which appears stable, no pulmonary edema/effusion noted, exposure looks a bit off to me, heart tell if inflammatory bronchitis changes versus chronic scarring. Radiology reviewed as below  Dg Chest 2 View  Result Date: 11/20/2016 CLINICAL DATA:  Productive cough, shortness of breath. EXAM: CHEST  2 VIEW COMPARISON:  Radiographs of July 17, 2016. FINDINGS: The heart size and mediastinal contours are within normal limits. Both lungs are clear. No pneumothorax or pleural effusion is noted. Stable eventration is seen involving the anterior portion of right hemidiaphragm. The visualized skeletal structures are unremarkable. IMPRESSION: No active cardiopulmonary disease. Electronically Signed   By: Marijo Conception, M.D.   On: 11/20/2016 10:45       ASSESSMENT/PLAN: Suspect post-bronchitis cough, based on chest x-ray I'm less suspicious of a community-acquired pneumonia, [facility] or added/increased any respiratory medications, will try this first in addition to cough medicine. Patient given printed prescription for antibiotic to start taking if he starts to feel worse  COPD with acute bronchitis ([place]) - Plan: DG Chest 2 View  Cough - Plan: DG Chest 2 View    Patient Instructions  Plan:  Add steroids, breathing  medicines and cough medicine  Start antibiotics (Levofloxacin) if Xray shows pneumonia, or if you get worse over the weekend     Follow-up plan: Return if symptoms worsen or fail to improve.  Visit summary with medication list and pertinent instructions was printed for patient to review, alert [place] if any changes needed. All questions at time of visit were answered - patient instructed to contact office with any additional concerns. ER/RTC precautions were reviewed with the patient and understanding verbalized.

## 2016-11-20 NOTE — Patient Instructions (Signed)
Plan:  Add steroids, breathing medicines and cough medicine  Start antibiotics (Levofloxacin) if Xray shows pneumonia, or if you get worse over the weekend

## 2016-12-03 ENCOUNTER — Encounter: Payer: Self-pay | Admitting: Family Medicine

## 2016-12-03 ENCOUNTER — Ambulatory Visit (INDEPENDENT_AMBULATORY_CARE_PROVIDER_SITE_OTHER): Payer: Medicare Other | Admitting: Family Medicine

## 2016-12-03 VITALS — BP 125/55 | HR 68 | Resp 18 | Wt 269.8 lb

## 2016-12-03 DIAGNOSIS — R7303 Prediabetes: Secondary | ICD-10-CM | POA: Diagnosis not present

## 2016-12-03 DIAGNOSIS — E784 Other hyperlipidemia: Secondary | ICD-10-CM | POA: Diagnosis not present

## 2016-12-03 DIAGNOSIS — F3341 Major depressive disorder, recurrent, in partial remission: Secondary | ICD-10-CM

## 2016-12-03 DIAGNOSIS — E7849 Other hyperlipidemia: Secondary | ICD-10-CM

## 2016-12-03 DIAGNOSIS — J449 Chronic obstructive pulmonary disease, unspecified: Secondary | ICD-10-CM

## 2016-12-03 DIAGNOSIS — I10 Essential (primary) hypertension: Secondary | ICD-10-CM

## 2016-12-03 LAB — POCT GLYCOSYLATED HEMOGLOBIN (HGB A1C): Hemoglobin A1C: 6.6

## 2016-12-03 NOTE — Progress Notes (Signed)
Subjective:    CC: HTN,   HPI:  Hypertension- Pt denies chest pain, SOB, dizziness, or heart palpitations.  Taking meds as directed w/o problems.  Denies medication side effects.    Impaired fasting glucose-no increased thirst or urination. No symptoms consistent with hypoglycemia. Lab Results  Component Value Date   HGBA1C 5.9 04/28/2016   MDD/GAD- On Lexapro 10 mg daily and doing well. He recently got over bronchitis. He said he did feel down low but during that time he's actually feeling much better. He does complain of little interest or pleasure doing things more than half the days but denies feeling down and sad or depressed. He does complain of some fatigue. But no difficulty concentrating and no thoughts of wanting to harm himself.  COPD-he did have recent exacerbation, complicated by bronchitis. He says been using his inhaler daily and even using his nebulizer some. He did use it this morning and feels like it's working well.  Hyperlipidemia-tolerating Lipitor 49 g well without any side effects.  Past medical history, Surgical history, Family history not pertinant except as noted below, Social history, Allergies, and medications have been entered into the medical record, reviewed, and corrections made.   Review of Systems: No fevers, chills, night sweats, weight loss, chest pain, or shortness of breath.   Objective:    General: Well Developed, well nourished, and in no acute distress.  Neuro: Alert and oriented x3, extra-ocular muscles intact, sensation grossly intact.  HEENT: Normocephalic, atraumatic  Skin: Warm and dry, no rashes. Cardiac: Regular rate and rhythm, no murmurs rubs or gallops, no lower extremity edema.  Respiratory: Clear to auscultation bilaterally. Not using accessory muscles, speaking in full sentences.   Impression and Recommendations:   HTN - Well controlled. Continue current regimen. Follow up in 6 months.  IFG - Well controlled. Continue current  regimen. Follow up in  6 months.   MDD/GAD- stable. Continue with Lexapro. PHQ 9 score of 6 today but 3 of the points were for fatigue.  COPD-recent flare but improving back to baseline.  Hyperlipidemia-due to recheck lipid panel.

## 2016-12-07 DIAGNOSIS — R7303 Prediabetes: Secondary | ICD-10-CM | POA: Diagnosis not present

## 2016-12-07 DIAGNOSIS — I10 Essential (primary) hypertension: Secondary | ICD-10-CM | POA: Diagnosis not present

## 2016-12-08 LAB — LIPID PANEL W/REFLEX DIRECT LDL
CHOLESTEROL: 161 mg/dL (ref <200)
HDL: 73 mg/dL (ref >40)
LDL-CHOLESTEROL: 72 mg/dL
NON-HDL CHOLESTEROL (CALC): 88 mg/dL (ref <130)
TRIGLYCERIDES: 80 mg/dL (ref <150)
Total CHOL/HDL Ratio: 2.2 Ratio (ref <5.0)

## 2016-12-08 LAB — COMPLETE METABOLIC PANEL WITH GFR
ALT: 18 U/L (ref 9–46)
AST: 18 U/L (ref 10–35)
Albumin: 3.7 g/dL (ref 3.6–5.1)
Alkaline Phosphatase: 81 U/L (ref 40–115)
BUN: 18 mg/dL (ref 7–25)
CALCIUM: 8.3 mg/dL — AB (ref 8.6–10.3)
CHLORIDE: 105 mmol/L (ref 98–110)
CO2: 29 mmol/L (ref 20–31)
CREATININE: 1.01 mg/dL (ref 0.70–1.11)
GFR, EST AFRICAN AMERICAN: 76 mL/min (ref >=60)
GFR, EST NON AFRICAN AMERICAN: 66 mL/min (ref >=60)
Glucose, Bld: 116 mg/dL — ABNORMAL HIGH (ref 65–99)
POTASSIUM: 4.4 mmol/L (ref 3.5–5.3)
Sodium: 142 mmol/L (ref 135–146)
Total Bilirubin: 0.4 mg/dL (ref 0.2–1.2)
Total Protein: 6 g/dL — ABNORMAL LOW (ref 6.1–8.1)

## 2016-12-21 ENCOUNTER — Other Ambulatory Visit: Payer: Self-pay | Admitting: Family Medicine

## 2017-01-19 ENCOUNTER — Other Ambulatory Visit: Payer: Self-pay | Admitting: Family Medicine

## 2017-01-19 DIAGNOSIS — L309 Dermatitis, unspecified: Secondary | ICD-10-CM

## 2017-02-12 ENCOUNTER — Other Ambulatory Visit: Payer: Self-pay | Admitting: Osteopathic Medicine

## 2017-03-01 ENCOUNTER — Other Ambulatory Visit: Payer: Self-pay | Admitting: Family Medicine

## 2017-03-01 ENCOUNTER — Encounter: Payer: Self-pay | Admitting: Sports Medicine

## 2017-03-01 ENCOUNTER — Ambulatory Visit (INDEPENDENT_AMBULATORY_CARE_PROVIDER_SITE_OTHER): Payer: Medicare Other

## 2017-03-01 ENCOUNTER — Ambulatory Visit (INDEPENDENT_AMBULATORY_CARE_PROVIDER_SITE_OTHER): Payer: Medicare Other | Admitting: Sports Medicine

## 2017-03-01 DIAGNOSIS — M1712 Unilateral primary osteoarthritis, left knee: Secondary | ICD-10-CM

## 2017-03-01 DIAGNOSIS — M170 Bilateral primary osteoarthritis of knee: Secondary | ICD-10-CM | POA: Diagnosis not present

## 2017-03-01 DIAGNOSIS — M179 Osteoarthritis of knee, unspecified: Secondary | ICD-10-CM | POA: Diagnosis not present

## 2017-03-01 NOTE — Progress Notes (Signed)
   Subjective:    I'm seeing this patient as a consultation for:  Dr. Beatrice Lecher  CC: Left knee pain  HPI: This is a pleasant 83 year old male with known right knee osteoarthritis, for the past several weeks he's had increasing pain at the medial joint line of his left knee, severe, persistent, difficult to walk, no mechanical symptoms, no trauma.  Past medical history, Surgical history, Family history not pertinant except as noted below, Social history, Allergies, and medications have been entered into the medical record, reviewed, and no changes needed.   Review of Systems: No headache, visual changes, nausea, vomiting, diarrhea, constipation, dizziness, abdominal pain, skin rash, fevers, chills, night sweats, weight loss, swollen lymph nodes, body aches, joint swelling, muscle aches, chest pain, shortness of breath, mood changes, visual or auditory hallucinations.   Objective:   General: Well Developed, well nourished, and in no acute distress.  Neuro:  Extra-ocular muscles intact, able to move all 4 extremities, sensation grossly intact.  Deep tendon reflexes tested were normal. Psych: Alert and oriented, mood congruent with affect. ENT:  Ears and nose appear unremarkable.  Hearing grossly normal. Neck: Unremarkable overall appearance, trachea midline.  No visible thyroid enlargement. Eyes: Conjunctivae and lids appear unremarkable.  Pupils equal and round. Skin: Warm and dry, no rashes noted.  Cardiovascular: Pulses palpable, no extremity edema. Left Knee: Normal to inspection with no erythema or effusion or obvious bony abnormalities. No significant effusion, tender to palpation severely at the medial joint line ROM normal in flexion and extension and lower leg rotation. Ligaments with solid consistent endpoints including ACL, PCL, LCL, MCL. Negative Mcmurray's and provocative meniscal tests. Non painful patellar compression. Patellar and quadriceps tendons  unremarkable. Hamstring and quadriceps strength is normal. Homans sign negative.  Procedure: Real-time Ultrasound Guided Injection of left knee Device: GE Logiq E  Verbal informed consent obtained.  Time-out conducted.  Noted no overlying erythema, induration, or other signs of local infection.  Skin prepped in a sterile fashion.  Local anesthesia: Topical Ethyl chloride.  With sterile technique and under real time ultrasound guidance:  1 mL Kenalog 40, 2 mL lidocaine, 2 mL bupivacaine injected easily. Completed without difficulty  Pain immediately resolved suggesting accurate placement of the medication.  Advised to call if fevers/chills, erythema, induration, drainage, or persistent bleeding.  Images permanently stored and available for review in the ultrasound unit.  Impression: Technically successful ultrasound guided injection.  Impression and Recommendations:   This case required medical decision making of moderate complexity.  Primary osteoarthritis of left knee Has failed over-the-counter NSAIDs, analgesics, topical cooling agents. X-rays, suspect osteoarthritis, so injected today. Home health physical therapy, patient is effectively homebound.  Return to see me in one month.  ___________________________________________ Gwen Her. Dianah Field, M.D., ABFM., CAQSM. Primary Care and [place] Instructor of [place] of [facility]

## 2017-03-01 NOTE — Assessment & Plan Note (Signed)
Has failed over-the-counter NSAIDs, analgesics, topical cooling agents. X-rays, suspect osteoarthritis, so injected today. Home health physical therapy, patient is effectively homebound.  Return to see me in one month.

## 2017-03-03 DIAGNOSIS — J449 Chronic obstructive pulmonary disease, unspecified: Secondary | ICD-10-CM | POA: Diagnosis not present

## 2017-03-03 DIAGNOSIS — M1712 Unilateral primary osteoarthritis, left knee: Secondary | ICD-10-CM | POA: Diagnosis not present

## 2017-03-03 DIAGNOSIS — I2510 Atherosclerotic heart disease of native coronary artery without angina pectoris: Secondary | ICD-10-CM | POA: Diagnosis not present

## 2017-03-03 DIAGNOSIS — G4733 Obstructive sleep apnea (adult) (pediatric): Secondary | ICD-10-CM | POA: Diagnosis not present

## 2017-03-03 DIAGNOSIS — Z9181 History of falling: Secondary | ICD-10-CM | POA: Diagnosis not present

## 2017-03-03 DIAGNOSIS — E039 Hypothyroidism, unspecified: Secondary | ICD-10-CM | POA: Diagnosis not present

## 2017-03-03 DIAGNOSIS — I10 Essential (primary) hypertension: Secondary | ICD-10-CM | POA: Diagnosis not present

## 2017-03-03 DIAGNOSIS — R262 Difficulty in walking, not elsewhere classified: Secondary | ICD-10-CM | POA: Diagnosis not present

## 2017-03-06 DIAGNOSIS — I2510 Atherosclerotic heart disease of native coronary artery without angina pectoris: Secondary | ICD-10-CM | POA: Diagnosis not present

## 2017-03-06 DIAGNOSIS — R262 Difficulty in walking, not elsewhere classified: Secondary | ICD-10-CM | POA: Diagnosis not present

## 2017-03-06 DIAGNOSIS — Z9181 History of falling: Secondary | ICD-10-CM | POA: Diagnosis not present

## 2017-03-06 DIAGNOSIS — I10 Essential (primary) hypertension: Secondary | ICD-10-CM | POA: Diagnosis not present

## 2017-03-06 DIAGNOSIS — G4733 Obstructive sleep apnea (adult) (pediatric): Secondary | ICD-10-CM | POA: Diagnosis not present

## 2017-03-06 DIAGNOSIS — M1712 Unilateral primary osteoarthritis, left knee: Secondary | ICD-10-CM | POA: Diagnosis not present

## 2017-03-06 DIAGNOSIS — E039 Hypothyroidism, unspecified: Secondary | ICD-10-CM | POA: Diagnosis not present

## 2017-03-06 DIAGNOSIS — J449 Chronic obstructive pulmonary disease, unspecified: Secondary | ICD-10-CM | POA: Diagnosis not present

## 2017-03-08 ENCOUNTER — Other Ambulatory Visit: Payer: Self-pay | Admitting: Family Medicine

## 2017-03-09 ENCOUNTER — Encounter: Payer: Self-pay | Admitting: Family Medicine

## 2017-03-09 ENCOUNTER — Ambulatory Visit (INDEPENDENT_AMBULATORY_CARE_PROVIDER_SITE_OTHER): Payer: Medicare Other | Admitting: Family Medicine

## 2017-03-09 VITALS — BP 128/51 | HR 72 | Ht 71.0 in | Wt 273.0 lb

## 2017-03-09 DIAGNOSIS — J441 Chronic obstructive pulmonary disease with (acute) exacerbation: Secondary | ICD-10-CM | POA: Diagnosis not present

## 2017-03-09 DIAGNOSIS — M1712 Unilateral primary osteoarthritis, left knee: Secondary | ICD-10-CM

## 2017-03-09 DIAGNOSIS — Z23 Encounter for immunization: Secondary | ICD-10-CM | POA: Diagnosis not present

## 2017-03-09 DIAGNOSIS — R195 Other fecal abnormalities: Secondary | ICD-10-CM

## 2017-03-09 DIAGNOSIS — G4733 Obstructive sleep apnea (adult) (pediatric): Secondary | ICD-10-CM | POA: Diagnosis not present

## 2017-03-09 DIAGNOSIS — Z9181 History of falling: Secondary | ICD-10-CM | POA: Diagnosis not present

## 2017-03-09 DIAGNOSIS — I2510 Atherosclerotic heart disease of native coronary artery without angina pectoris: Secondary | ICD-10-CM | POA: Diagnosis not present

## 2017-03-09 DIAGNOSIS — I10 Essential (primary) hypertension: Secondary | ICD-10-CM | POA: Diagnosis not present

## 2017-03-09 DIAGNOSIS — R7303 Prediabetes: Secondary | ICD-10-CM | POA: Diagnosis not present

## 2017-03-09 DIAGNOSIS — J449 Chronic obstructive pulmonary disease, unspecified: Secondary | ICD-10-CM | POA: Diagnosis not present

## 2017-03-09 DIAGNOSIS — E039 Hypothyroidism, unspecified: Secondary | ICD-10-CM | POA: Diagnosis not present

## 2017-03-09 DIAGNOSIS — R262 Difficulty in walking, not elsewhere classified: Secondary | ICD-10-CM | POA: Diagnosis not present

## 2017-03-09 MED ORDER — TIOTROPIUM BROMIDE-OLODATEROL 2.5-2.5 MCG/ACT IN AERS: 2.0000 | INHALATION_SPRAY | Freq: Every day | RESPIRATORY_TRACT | 5 refills | Status: DC

## 2017-03-09 MED ORDER — AMBULATORY NON FORMULARY MEDICATION: 0 refills | Status: DC

## 2017-03-09 MED ORDER — PREDNISONE 20 MG PO TABS: 40.0000 mg | ORAL_TABLET | Freq: Every day | ORAL | 0 refills | Status: DC

## 2017-03-09 MED ORDER — DOXYCYCLINE HYCLATE 100 MG PO TABS: 100.0000 mg | ORAL_TABLET | Freq: Two times a day (BID) | ORAL | 0 refills | Status: DC

## 2017-03-09 NOTE — Progress Notes (Signed)
Subjective:    Patient ID: Bryan Hatfield, male    DOB: 02-24-1927, 83 y.o.   MRN: [id number]  HPI  Impaired fasting glucose-no increased thirst or urination. No symptoms consistent with hypoglycemia.Asked him to come back early for this office visit because his last hemoglobin A1c had bumped up. He has not been as active and admittedly his diet wasn't as good as it had been recently.  He is also been wheezing and coughing for about a week and getting up some yellow-colored mucus. He denies any fever or chills. No nausea vomiting or diarrhea. He's been taking some over-the-counter counter cough medication. He uses his Spiriva daily and then also uses his albuterol nebulizer daily. He currently has albuterol in 6 Combivent.  He also reports that he's been seeing some blood in his underwear. He says he has not seen any blood in his stool or when he wipes and has not noticed any blood in the urine and so really can't figure out where it's coming from. He has not noticed any sores or wounds. No pain with BM.   Knee pain - he has been using his cane some but would like a 4 prong cane instead.  He has started home PT and has had an injection.   Review of Systems  BP (!) 128/51   Pulse 72   Ht 5\' 11"  (1.803 m)   Wt 273 lb (123.8 kg)   SpO2 97%   BMI 38.08 kg/m     No Known Allergies  Past Medical History:  Diagnosis Date  . Acute respiratory failure ([place]) 09/25/11   hypoxic  . Anxiety   . CAD (coronary artery disease)    a. PTCA 2001. b. f/u cath 05/2012 heavy calcification, no PCI required. c. f/u cath for recurrent sx 2017: stable mod CAD unchanged from prior, mild progression of small vessel disease, elevated LVEDP.  . Colon polyps   . COPD (chronic obstructive pulmonary disease) ([place])   . GERD (gastroesophageal reflux disease)   . High cholesterol   . Hypertension   . Peripheral vascular disease ([place])    patient denies knowledge of, but listed in his chart  . Pleural effusion    Parapneumonic ?r/t PNA s/p thoracentesis 2008  . Pneumonia    hx  . Sleep apnea    cpap     Past Surgical History:  Procedure Laterality Date  . CARDIAC CATHETERIZATION N/A 11/25/2015   Procedure: Left Heart Cath and Coronary Angiography;  Surgeon: Leonie Man, MD;  Location: [facility];  Service: Cardiovascular;  Laterality: N/A;  . CATARACT EXTRACTION W/ INTRAOCULAR LENS  IMPLANT, BILATERAL  ~ 2006  . CHOLECYSTECTOMY N/A 10/11/2012   Procedure: LAPAROSCOPIC CHOLECYSTECTOMY WITH INTRAOPERATIVE CHOLANGIOGRAM;  Surgeon: Adin Hector, MD;  Location: [facility];  Service: General;  Laterality: N/A;  . CORONARY ANGIOPLASTY  01/2000  . HEMORRHOID SURGERY  1960's    Social History   Social History  . Marital status: Married    Spouse name: N/A  . Number of children: N/A  . Years of education: N/A   Occupational History  . Retired     Surveyor, quantity - Civil engineer, [employer] and [employer]   Social History Main Topics  . Smoking status: Former Smoker    Packs/day: 1.00    Years: 10.00    Types: Cigarettes    Quit date: 06/01/1982  . Smokeless tobacco: Never Used     Comment: "quit smoking ~ 25 years ago; smoked ~ 1  pk/wk"  . Alcohol use No  . Drug use: No  . Sexual activity: No   Other Topics Concern  . Not on file   Social History Narrative  . No narrative on file    Family History  Problem Relation Age of Onset  . Stroke Mother   . Arthritis Mother   . Asthma Mother   . Emphysema Mother   . Stroke Father   . Arthritis Sister   . Stroke Sister   . Heart attack Neg Hx     Outpatient Encounter Prescriptions as of 03/09/2017  Medication Sig  . albuterol (PROVENTIL) (2.5 MG/3ML) 0.083% nebulizer solution Take 3 mLs (2.5 mg total) by nebulization every 6 (six) hours as needed for wheezing. Breathing treatment  . AMBULATORY NON FORMULARY MEDICATION Medication Name: 4 prong cane. Dx: gait instability  . Ascorbic Acid (VITAMIN C PO) Take 1 tablet by mouth daily.  Marland Kitchen aspirin EC 81 MG  tablet Take 81 mg by mouth daily.    Marland Kitchen atorvastatin (LIPITOR) 40 MG tablet TAKE 1 TABLET BY MOUTH AT  BEDTIME  . doxycycline (VIBRA-TABS) 100 MG tablet Take 1 tablet (100 mg total) by mouth 2 (two) times daily.  Marland Kitchen escitalopram (LEXAPRO) 10 MG tablet TAKE 1 TABLET BY MOUTH  DAILY  . fluticasone (FLONASE) 50 MCG/ACT nasal spray One spray in each nostril twice a day, use left hand for right nostril, and right hand for left nostril.  . furosemide (LASIX) 40 MG tablet TAKE 1 TABLET BY MOUTH  EVERY OTHER DAY USUALLY ON  MONDAY,WEDNESDAY,FRIDAY  . ipratropium-albuterol (DUONEB) 0.5-2.5 (3) MG/3ML SOLN USE 1 VIAL VIA NEBULIZER EVERY 2 HOURS AS NEEDED FOR WHEEZING, SHORTNESS OF BREATH.  . isosorbide mononitrate (IMDUR) 30 MG 24 hr tablet Take 30 mg by mouth daily.  Marland Kitchen levothyroxine (SYNTHROID, LEVOTHROID) 112 MCG tablet TAKE 1 TABLET BY MOUTH  DAILY BEFORE BREAKFAST  . Multiple Vitamins-Minerals (ICAPS MV PO) Take 1 capsule by mouth daily.  . Nebulizers (COMPRESSOR/NEBULIZER) MISC by Does not apply route.  Marland Kitchen NITROSTAT 0.4 MG SL tablet DISSOLVE 1 TABLET UNDER THE TONGUE EVERY 5 MINUTES AS  NEEDED CHEST PAIN  . nystatin (MYCOSTATIN/NYSTOP) powder Apply topically 4 (four) times daily.  . predniSONE (DELTASONE) 20 MG tablet Take 2 tablets (40 mg total) by mouth daily.  . Tiotropium Bromide-Olodaterol (STIOLTO RESPIMAT) 2.5-2.5 MCG/ACT AERS Inhale 2 puffs into the lungs daily.  Marland Kitchen triamcinolone cream (KENALOG) 0.1 % APPLY EXTERNALLY TO THE AFFECTED AREA DAILY FOR UP TO 2 WEEKS AS NEEDED. AVOID FACE  . VESICARE 5 MG tablet Take 5 mg by mouth daily.  Marland Kitchen zolpidem (AMBIEN) 5 MG tablet Take 1 tablet (5 mg total) by mouth at bedtime as needed for sleep. Ok to refill 06/01/2016  . [DISCONTINUED] Tiotropium Bromide Monohydrate (SPIRIVA RESPIMAT) 1.25 MCG/ACT AERS Inhale into the lungs.   No facility-administered encounter medications on file as of 03/09/2017.           Objective:   Physical Exam  Constitutional: He  is oriented to person, place, and time. He appears well-developed and well-nourished.  HENT:  Head: Normocephalic and atraumatic.  Cardiovascular: Normal rate, regular rhythm and normal heart sounds.   Pulmonary/Chest: Effort normal.  Good air movement with a few rhonchi in the right lower base that cleared after several deep breaths.  Genitourinary: Rectal exam shows guaiac positive stool. Prostate is enlarged. Prostate is not tender.     Genitourinary Comments: On the buttock area he did have a small scab  on the left buttock cheek with some old scarring. It looks like he gets recurrent folliculitis at times. He has some erythema along the buttock crease do not see any open wounds or drainage. He has some hemorrhoid cream in place. No bleeding hemorrhoids etc. on exam. Good rectal tone with Hemoccult testing.  Neurological: He is alert and oriented to person, place, and time.  Skin: Skin is warm and dry.  Psychiatric: He has a normal mood and affect. His behavior is normal.        Assessment & Plan:  IFG - A1c down to 6.4. And has lost 4 pounds. He's been trying to do better with his diet. He admits he still struggling a little bit with eating bread.  COPD exacerbation-we'll treat with doxycycline and prednisone. I'll not significantly better in one week. To change the Spiriva to Stiolto.    Knee pain - given new rx for cane. continue PT.    In regards to the bleeding the etiology still unclear. He does have a small scab on the left buttock cheek which certainly could've been a source of bleeding. Do not see any external hemorrhoids that are actively bleeding or scabs. I do not see any open wounds currently that he has not noticed any bleeding in about 3 days. Rectal exam was positive for blood on the guaiac test.  Blood in stool - Guaiac positive. Will refer to Dr. [name] GI.

## 2017-03-09 NOTE — Patient Instructions (Addendum)
I am changing your Spiriva to [employer].  It has 2 medicines in it for your breathing.    Blood sugar is looking better overall. Just continue to work on cutting back on things like bread and trying to stay active. I would love to see you lose another 4 pounds over the next 3 months.

## 2017-03-15 DIAGNOSIS — J449 Chronic obstructive pulmonary disease, unspecified: Secondary | ICD-10-CM | POA: Diagnosis not present

## 2017-03-15 DIAGNOSIS — I2510 Atherosclerotic heart disease of native coronary artery without angina pectoris: Secondary | ICD-10-CM | POA: Diagnosis not present

## 2017-03-15 DIAGNOSIS — E039 Hypothyroidism, unspecified: Secondary | ICD-10-CM | POA: Diagnosis not present

## 2017-03-15 DIAGNOSIS — G4733 Obstructive sleep apnea (adult) (pediatric): Secondary | ICD-10-CM | POA: Diagnosis not present

## 2017-03-15 DIAGNOSIS — R262 Difficulty in walking, not elsewhere classified: Secondary | ICD-10-CM | POA: Diagnosis not present

## 2017-03-15 DIAGNOSIS — I10 Essential (primary) hypertension: Secondary | ICD-10-CM | POA: Diagnosis not present

## 2017-03-15 DIAGNOSIS — Z9181 History of falling: Secondary | ICD-10-CM | POA: Diagnosis not present

## 2017-03-15 DIAGNOSIS — M1712 Unilateral primary osteoarthritis, left knee: Secondary | ICD-10-CM | POA: Diagnosis not present

## 2017-03-16 ENCOUNTER — Ambulatory Visit (INDEPENDENT_AMBULATORY_CARE_PROVIDER_SITE_OTHER): Payer: Medicare Other | Admitting: Sports Medicine

## 2017-03-16 ENCOUNTER — Encounter: Payer: Self-pay | Admitting: Sports Medicine

## 2017-03-16 DIAGNOSIS — M1712 Unilateral primary osteoarthritis, left knee: Secondary | ICD-10-CM | POA: Diagnosis not present

## 2017-03-16 DIAGNOSIS — G8929 Other chronic pain: Secondary | ICD-10-CM | POA: Diagnosis not present

## 2017-03-16 DIAGNOSIS — M25562 Pain in left knee: Secondary | ICD-10-CM | POA: Diagnosis not present

## 2017-03-16 MED ORDER — TRAMADOL HCL 50 MG PO TABS: ORAL_TABLET | ORAL | 0 refills | Status: DC

## 2017-03-16 NOTE — Assessment & Plan Note (Addendum)
Unfortunately had no relief after the injection at the last visit. We are going to proceed with MRI to evaluate the degree of meniscal tearing, and for a subchondral insufficiency fractures considering his severe medial joint line pain as well as pain over the medial tibial plateau. Return to go over MRI results, short course of tramadol for temporary pain relief.  MRI confirms medial and lateral meniscal tearing as well as a subchondral insufficiency fracture as expected, referral for surgical evaluation.

## 2017-03-16 NOTE — Progress Notes (Addendum)
  Subjective:    CC: Follow-up  HPI: Left knee pain: Persistent after injection, tells me there is no relief, not even temporary. Pain is moderate, persistent, present for over 6 weeks in spite of physical therapy, analgesics, injection.  Past medical history:  Negative.  See flowsheet/record as well for more information.  Surgical history: Negative.  See flowsheet/record as well for more information.  Family history: Negative.  See flowsheet/record as well for more information.  Social history: Negative.  See flowsheet/record as well for more information.  Allergies, and medications have been entered into the medical record, reviewed, and no changes needed.   Review of Systems: No fevers, chills, night sweats, weight loss, chest pain, or shortness of breath.   Objective:    General: Well Developed, well nourished, and in no acute distress.  Neuro: Alert and oriented x3, extra-ocular muscles intact, sensation grossly intact.  HEENT: Normocephalic, atraumatic, pupils equal round reactive to light, neck supple, no masses, no lymphadenopathy, thyroid nonpalpable.  Skin: Warm and dry, no rashes. Cardiac: Regular rate and rhythm, no murmurs rubs or gallops, no lower extremity edema.  Respiratory: Clear to auscultation bilaterally. Not using accessory muscles, speaking in full sentences. Left Knee: Normal to inspection with no erythema or effusion or obvious bony abnormalities. Severe tenderness at the medial joint line, as well as at the medial tibial plateau ROM normal in flexion and extension and lower leg rotation. Ligaments with solid consistent endpoints including ACL, PCL, LCL, MCL. Negative Mcmurray's and provocative meniscal tests. Non painful patellar compression. Patellar and quadriceps tendons unremarkable. Hamstring and quadriceps strength is normal.  Impression and Recommendations:    Primary osteoarthritis of left knee Unfortunately had no relief after the injection at  the last visit. We are going to proceed with MRI to evaluate the degree of meniscal tearing, and for a subchondral insufficiency fractures considering his severe medial joint line pain as well as pain over the medial tibial plateau. Return to go over MRI results, short course of tramadol for temporary pain relief.  MRI confirms medial and lateral meniscal tearing as well as a subchondral insufficiency fracture as expected, referral for surgical evaluation. ___________________________________________ Gwen Her. Dianah Field, M.D., ABFM., CAQSM. Primary Care and [place] Instructor of [place] of [facility] of Medicine

## 2017-03-17 DIAGNOSIS — Z9181 History of falling: Secondary | ICD-10-CM | POA: Diagnosis not present

## 2017-03-17 DIAGNOSIS — I2510 Atherosclerotic heart disease of native coronary artery without angina pectoris: Secondary | ICD-10-CM | POA: Diagnosis not present

## 2017-03-17 DIAGNOSIS — R262 Difficulty in walking, not elsewhere classified: Secondary | ICD-10-CM | POA: Diagnosis not present

## 2017-03-17 DIAGNOSIS — G4733 Obstructive sleep apnea (adult) (pediatric): Secondary | ICD-10-CM | POA: Diagnosis not present

## 2017-03-17 DIAGNOSIS — E039 Hypothyroidism, unspecified: Secondary | ICD-10-CM | POA: Diagnosis not present

## 2017-03-17 DIAGNOSIS — I10 Essential (primary) hypertension: Secondary | ICD-10-CM | POA: Diagnosis not present

## 2017-03-17 DIAGNOSIS — J449 Chronic obstructive pulmonary disease, unspecified: Secondary | ICD-10-CM | POA: Diagnosis not present

## 2017-03-17 DIAGNOSIS — M1712 Unilateral primary osteoarthritis, left knee: Secondary | ICD-10-CM | POA: Diagnosis not present

## 2017-03-21 ENCOUNTER — Other Ambulatory Visit: Payer: Self-pay | Admitting: Family Medicine

## 2017-03-21 DIAGNOSIS — L309 Dermatitis, unspecified: Secondary | ICD-10-CM

## 2017-03-23 DIAGNOSIS — I10 Essential (primary) hypertension: Secondary | ICD-10-CM | POA: Diagnosis not present

## 2017-03-23 DIAGNOSIS — I2510 Atherosclerotic heart disease of native coronary artery without angina pectoris: Secondary | ICD-10-CM | POA: Diagnosis not present

## 2017-03-23 DIAGNOSIS — M1712 Unilateral primary osteoarthritis, left knee: Secondary | ICD-10-CM | POA: Diagnosis not present

## 2017-03-23 DIAGNOSIS — E039 Hypothyroidism, unspecified: Secondary | ICD-10-CM | POA: Diagnosis not present

## 2017-03-23 DIAGNOSIS — Z800 Family history of malignant neoplasm of digestive organs: Secondary | ICD-10-CM | POA: Diagnosis not present

## 2017-03-23 DIAGNOSIS — Z9181 History of falling: Secondary | ICD-10-CM | POA: Diagnosis not present

## 2017-03-23 DIAGNOSIS — J449 Chronic obstructive pulmonary disease, unspecified: Secondary | ICD-10-CM | POA: Diagnosis not present

## 2017-03-23 DIAGNOSIS — G4733 Obstructive sleep apnea (adult) (pediatric): Secondary | ICD-10-CM | POA: Diagnosis not present

## 2017-03-23 DIAGNOSIS — K921 Melena: Secondary | ICD-10-CM | POA: Diagnosis not present

## 2017-03-23 DIAGNOSIS — R262 Difficulty in walking, not elsewhere classified: Secondary | ICD-10-CM | POA: Diagnosis not present

## 2017-03-24 DIAGNOSIS — I2510 Atherosclerotic heart disease of native coronary artery without angina pectoris: Secondary | ICD-10-CM | POA: Diagnosis not present

## 2017-03-24 DIAGNOSIS — J449 Chronic obstructive pulmonary disease, unspecified: Secondary | ICD-10-CM | POA: Diagnosis not present

## 2017-03-24 DIAGNOSIS — G4733 Obstructive sleep apnea (adult) (pediatric): Secondary | ICD-10-CM | POA: Diagnosis not present

## 2017-03-24 DIAGNOSIS — M1712 Unilateral primary osteoarthritis, left knee: Secondary | ICD-10-CM | POA: Diagnosis not present

## 2017-03-24 DIAGNOSIS — I10 Essential (primary) hypertension: Secondary | ICD-10-CM | POA: Diagnosis not present

## 2017-03-24 DIAGNOSIS — R262 Difficulty in walking, not elsewhere classified: Secondary | ICD-10-CM | POA: Diagnosis not present

## 2017-03-24 DIAGNOSIS — E039 Hypothyroidism, unspecified: Secondary | ICD-10-CM | POA: Diagnosis not present

## 2017-03-24 DIAGNOSIS — Z9181 History of falling: Secondary | ICD-10-CM | POA: Diagnosis not present

## 2017-03-25 ENCOUNTER — Other Ambulatory Visit: Payer: Self-pay | Admitting: Gastroenterology

## 2017-03-25 DIAGNOSIS — K921 Melena: Secondary | ICD-10-CM

## 2017-03-29 ENCOUNTER — Ambulatory Visit: Payer: Medicare Other | Admitting: Sports Medicine

## 2017-03-29 ENCOUNTER — Ambulatory Visit (INDEPENDENT_AMBULATORY_CARE_PROVIDER_SITE_OTHER): Payer: Medicare Other

## 2017-03-29 DIAGNOSIS — M25562 Pain in left knee: Secondary | ICD-10-CM

## 2017-03-29 DIAGNOSIS — M1712 Unilateral primary osteoarthritis, left knee: Secondary | ICD-10-CM

## 2017-03-29 DIAGNOSIS — M23201 Derangement of unspecified lateral meniscus due to old tear or injury, left knee: Secondary | ICD-10-CM | POA: Diagnosis not present

## 2017-03-29 DIAGNOSIS — G8929 Other chronic pain: Secondary | ICD-10-CM

## 2017-03-29 DIAGNOSIS — M23222 Derangement of posterior horn of medial meniscus due to old tear or injury, left knee: Secondary | ICD-10-CM

## 2017-03-29 NOTE — Addendum Note (Signed)
Addended by: Silverio Decamp on: 03/29/2017 02:36 PM   Modules accepted: Orders

## 2017-03-31 ENCOUNTER — Telehealth: Payer: Self-pay

## 2017-03-31 DIAGNOSIS — M1712 Unilateral primary osteoarthritis, left knee: Secondary | ICD-10-CM

## 2017-03-31 NOTE — Telephone Encounter (Signed)
Wife of pt called stating he is still in significant pain. Would like to know if he can get something for it? Please advise.

## 2017-03-31 NOTE — Telephone Encounter (Signed)
Wife of pt states tramadol is not helping with the pain at all.

## 2017-03-31 NOTE — Telephone Encounter (Signed)
Nonweightbearing with a crutch, also I gave him some tramadol 2 weeks ago, is this used up?  I am happy to provide some more.

## 2017-04-01 ENCOUNTER — Ambulatory Visit
Admission: RE | Admit: 2017-04-01 | Discharge: 2017-04-01 | Disposition: A | Discharge status: Home / Self Care | Payer: Medicare Other | Source: Ambulatory Visit | Attending: Gastroenterology | Admitting: Gastroenterology

## 2017-04-01 DIAGNOSIS — K921 Melena: Secondary | ICD-10-CM

## 2017-04-01 MED ORDER — TRAMADOL HCL 50 MG PO TABS: 100.0000 mg | ORAL_TABLET | Freq: Three times a day (TID) | ORAL | 3 refills | Status: DC | PRN

## 2017-04-01 NOTE — Telephone Encounter (Signed)
Pt is out of tramadol at this time and would need a refill. Information was given to take 2 tabs 3x a day. Verbalized understanding.

## 2017-04-01 NOTE — Telephone Encounter (Signed)
Rx in box. 

## 2017-04-01 NOTE — Telephone Encounter (Signed)
Double to 2 tramadol 3x a day (6 pills total per day).  I can write out for more if needed.

## 2017-04-03 DIAGNOSIS — M1712 Unilateral primary osteoarthritis, left knee: Secondary | ICD-10-CM | POA: Diagnosis not present

## 2017-04-03 DIAGNOSIS — S83282A Other tear of lateral meniscus, current injury, left knee, initial encounter: Secondary | ICD-10-CM | POA: Diagnosis not present

## 2017-04-03 DIAGNOSIS — S83242A Other tear of medial meniscus, current injury, left knee, initial encounter: Secondary | ICD-10-CM | POA: Diagnosis not present

## 2017-04-05 ENCOUNTER — Other Ambulatory Visit: Payer: Self-pay | Admitting: Gastroenterology

## 2017-04-05 DIAGNOSIS — K921 Melena: Secondary | ICD-10-CM

## 2017-04-05 DIAGNOSIS — Z800 Family history of malignant neoplasm of digestive organs: Secondary | ICD-10-CM

## 2017-04-08 ENCOUNTER — Other Ambulatory Visit: Payer: Self-pay | Admitting: Family Medicine

## 2017-04-09 ENCOUNTER — Ambulatory Visit
Admission: RE | Admit: 2017-04-09 | Discharge: 2017-04-09 | Disposition: A | Discharge status: Home / Self Care | Payer: Medicare Other | Source: Ambulatory Visit | Attending: Gastroenterology | Admitting: Gastroenterology

## 2017-04-09 ENCOUNTER — Telehealth: Payer: Self-pay

## 2017-04-09 ENCOUNTER — Telehealth: Payer: Self-pay | Admitting: Emergency Medicine

## 2017-04-09 DIAGNOSIS — K921 Melena: Secondary | ICD-10-CM

## 2017-04-09 DIAGNOSIS — K5730 Diverticulosis of large intestine without perforation or abscess without bleeding: Secondary | ICD-10-CM | POA: Diagnosis not present

## 2017-04-09 DIAGNOSIS — Z800 Family history of malignant neoplasm of digestive organs: Secondary | ICD-10-CM

## 2017-04-09 MED ORDER — IOPAMIDOL (ISOVUE-300) INJECTION 61%
125.0000 mL | Freq: Once | INTRAVENOUS | Status: AC | PRN
  Administered 2017-04-09: 125 mL via INTRAVENOUS

## 2017-04-09 NOTE — Telephone Encounter (Signed)
Patient's wife called to report his bottle of NG has been lost and his next refill by mail won't arrive until 04/24/17; could we please phone interim rx to [facility]. After chart review I have done this. PK

## 2017-04-09 NOTE — Telephone Encounter (Signed)
Perfect. Thank you!

## 2017-04-09 NOTE — Telephone Encounter (Signed)
    [facility] Pre-operative Risk Assessment    Request for surgical clearance:  1. What type of surgery is being performed? L Knee Arthroscopy   2. When is this surgery scheduled? undetermined   3. Are there any medications that need to be held prior to surgery and how long?ASA    4. Practice name and name of physician performing surgery? [facility]/ Dr. Dorna Leitz   5. What is your office phone and fax number? [phone]   6. Anesthesia type (None, local, MAC, general) ? Non-specified   Frederik Schmidt 04/09/2017, 3:20 PM  _________________________________________________________________   (provider comments below)

## 2017-04-09 NOTE — Telephone Encounter (Signed)
    Chart reviewed as part of pre-operative protocol coverage. Because of Bryan Hatfield past medical history and time since last visit, he/she will require a follow-up visit in order to better assess preoperative cardiovascular risk.   Pre-op covering staff: - Please schedule appointment and call patient to inform them. - Please contact requesting surgeon's office via preferred method (i.e, phone, fax) to inform them of need for appointment prior to surgery.  Murray Hodgkins, NP  04/09/2017, 4:27 PM

## 2017-04-12 NOTE — Telephone Encounter (Signed)
Pt apt for preop clearance has already been scheduled.

## 2017-04-13 ENCOUNTER — Encounter: Payer: Self-pay | Admitting: Physician Assistant

## 2017-04-13 ENCOUNTER — Ambulatory Visit: Payer: Medicare Other | Admitting: Physician Assistant

## 2017-04-13 VITALS — BP 122/72 | HR 80 | Ht 71.0 in | Wt 275.2 lb

## 2017-04-13 DIAGNOSIS — I5033 Acute on chronic diastolic (congestive) heart failure: Secondary | ICD-10-CM | POA: Diagnosis not present

## 2017-04-13 DIAGNOSIS — Z9861 Coronary angioplasty status: Secondary | ICD-10-CM | POA: Diagnosis not present

## 2017-04-13 DIAGNOSIS — I959 Hypotension, unspecified: Secondary | ICD-10-CM

## 2017-04-13 DIAGNOSIS — I10 Essential (primary) hypertension: Secondary | ICD-10-CM | POA: Diagnosis not present

## 2017-04-13 DIAGNOSIS — I2510 Atherosclerotic heart disease of native coronary artery without angina pectoris: Secondary | ICD-10-CM

## 2017-04-13 DIAGNOSIS — G4730 Sleep apnea, unspecified: Secondary | ICD-10-CM | POA: Diagnosis not present

## 2017-04-13 DIAGNOSIS — E7849 Other hyperlipidemia: Secondary | ICD-10-CM

## 2017-04-13 MED ORDER — FUROSEMIDE 40 MG PO TABS: ORAL_TABLET | ORAL | 3 refills | Status: DC

## 2017-04-13 NOTE — Progress Notes (Signed)
Agree 

## 2017-04-13 NOTE — Progress Notes (Addendum)
Cardiology Office Note    Date:  04/13/2017   ID:  Bryan Hatfield, DOB [birth year], MRN [id number]  PCP:  [name], MD  Cardiologist:  Dr. [name]   Chief Complaint: Surgical clearance for L Knee Arthroscopy   History of Present Illness:   Bryan Hatfield is a 83 y.o. male with hx of CAD, PTCA 2001, heavy coronary calcification at cath, chronic diastolic heart failure,  hypertension, HLD, COPD, OSA on CPAP, parapneumonic pleural effusion r/t PNA s/p thoracentesis 2008, h/o asbestos exposure, remote tobacco abuse (only 5 year history of smoking) who presents for surgical clearance.   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 LVEF was normal but LVEDP was 14NWGN, likely diastolic dysfunction. Started on low dose diuretics.   Last seen by Dr. [name] 06/2016. Encouraged exercise program.   Here today for surgical clearance.  Patient was doing exercise 3 times per week up until 1 month ago when he started noticing left knee issue.  He was doing bicycling and most significant for approximately 1.5 hours each time without any significant discomfort.  Patient has a chronic shortness of breath with exertion due to COPD.  He is compliant with CPAP machine.  Sleeps on 2 pillows chronically.  No exacerbating symptoms from baseline.   Past Medical History:  Diagnosis Date  . Acute respiratory failure ([place]) 09/25/11   hypoxic  . Anxiety   . CAD (coronary artery disease)    a. PTCA 2001. b. f/u cath 05/2012 heavy calcification, no PCI required. c. f/u cath for recurrent sx 2017: stable mod CAD unchanged from prior, mild progression of small vessel disease, elevated LVEDP.  . Colon polyps   . COPD (chronic obstructive pulmonary disease) ([place])   . GERD (gastroesophageal reflux disease)   . High  cholesterol   . Hypertension   . Peripheral vascular disease ([place])    patient denies knowledge of, but listed in his chart  . Pleural effusion    Parapneumonic ?r/t PNA s/p thoracentesis 2008  . Pneumonia    hx  . Sleep apnea    cpap     Past Surgical History:  Procedure Laterality Date  . CATARACT EXTRACTION W/ INTRAOCULAR LENS  IMPLANT, BILATERAL  ~ 2006  . CORONARY ANGIOPLASTY  01/2000  . HEMORRHOID SURGERY  1960's    Current Medications: Prior to Admission medications   Medication Sig Start Date End Date Taking? Authorizing Provider  albuterol (PROVENTIL) (2.5 MG/3ML) 0.083% nebulizer solution Take 3 mLs (2.5 mg total) by nebulization every 6 (six) hours as needed for wheezing. Breathing treatment 08/27/16   [name], MD  AMBULATORY NON FORMULARY MEDICATION Medication Name: 4 prong cane. Dx: gait instability 03/09/17   [name], MD  Ascorbic Acid (VITAMIN C PO) Take 1 tablet by mouth daily.    [provider]  aspirin EC 81 MG tablet Take 81 mg by mouth daily.      [provider]  atorvastatin (LIPITOR) 40 MG tablet TAKE 1 TABLET BY MOUTH AT  BEDTIME 12/21/16   [name], MD  doxycycline (VIBRA-TABS) 100 MG tablet Take 1 tablet (100 mg total) by mouth 2 (two) times daily. 03/09/17   [name], MD  escitalopram (LEXAPRO) 10 MG tablet TAKE 1 TABLET BY MOUTH  DAILY 03/01/17  [name], MD  fluticasone [facility]) 50 MCG/ACT nasal spray One spray in each nostril twice a day, use left hand for right nostril, and right hand for left nostril. 04/21/16   Silverio Decamp, MD  furosemide (LASIX) 40 MG tablet TAKE 1 TABLET BY MOUTH  EVERY OTHER DAY USUALLY ON  MONDAY,WEDNESDAY,FRIDAY 08/20/16   [name], MD  ipratropium-albuterol (DUONEB) 0.5-2.5 (3) MG/3ML SOLN USE 1 VIAL VIA NEBULIZER EVERY 2 HOURS AS NEEDED FOR WHEEZING, SHORTNESS OF BREATH. 02/12/17   [name], MD  isosorbide  mononitrate (IMDUR) 30 MG 24 hr tablet Take 30 mg by mouth daily.    [provider]  levothyroxine (SYNTHROID, LEVOTHROID) 112 MCG tablet TAKE 1 TABLET BY MOUTH  DAILY BEFORE BREAKFAST 03/08/17   [name], MD  Multiple Vitamins-Minerals (ICAPS MV PO) Take 1 capsule by mouth daily.    [provider]  Nebulizers (COMPRESSOR/NEBULIZER) MISC by Does not apply route. 10/06/15   [provider]  nitroGLYCERIN (NITROSTAT) 0.4 MG SL tablet DISSOLVE 1 TABLET UNDER THE TONGUE EVERY 5 MINUTES AS  NEEDED CHEST PAIN 04/08/17   [name], MD  nystatin (MYCOSTATIN/NYSTOP) powder Apply topically 4 (four) times daily. 08/03/16   [name], MD  predniSONE (DELTASONE) 20 MG tablet Take 2 tablets (40 mg total) by mouth daily. 03/09/17   [name], MD  Tiotropium Bromide-Olodaterol (STIOLTO RESPIMAT) 2.5-2.5 MCG/ACT AERS Inhale 2 puffs into the lungs daily. 03/09/17   [name], MD  traMADol (ULTRAM) 50 MG tablet Take 2 tablets (100 mg total) by mouth 3 (three) times daily as needed. 04/01/17   Silverio Decamp, MD  triamcinolone cream (KENALOG) 0.1 % APPLY EXTERNALLY TO THE AFFECTED AREA DAILY FOR UP TO 2 WEEKS AS NEEDED. AVOID FACE 03/22/17   [name], MD  VESICARE 5 MG tablet Take 5 mg by mouth daily. 06/12/16   [provider]  zolpidem (AMBIEN) 5 MG tablet Take 1 tablet (5 mg total) by mouth at bedtime as needed for sleep. Ok to refill 06/01/2016 04/28/16   [name], MD    Allergies:   Patient has no known allergies.   Social History   Socioeconomic History  . Marital status: Married    Spouse name: None  . Number of children: None  . Years of education: None  . Highest education level: None  Social Needs  . Financial resource strain: None  . Food insecurity - worry: None  . Food insecurity - inability: None  . Transportation needs - medical: None  . Transportation needs - non-medical:  None  Occupational History  . Occupation: Retired    Comment: Surveyor, quantity - [employer]  Tobacco Use  . Smoking status: Former Smoker    Packs/day: 1.00    Years: 10.00    Pack years: 10.00    Types: Cigarettes    Last attempt to quit: 06/01/1982    Years since quitting: 34.8  . Smokeless tobacco: Never Used  . Tobacco comment: "quit smoking ~ 25 years ago; smoked ~ 1 pk/wk"  Substance and Sexual Activity  . Alcohol use: No  . Drug use: No  . Sexual activity: No  Other Topics Concern  . None  Social History Narrative  . None     Family History:  The patient's family history includes Arthritis in his mother and sister; Asthma in his mother; Emphysema in his mother; Stroke in his father, mother, and sister.  ROS:   Please see the history of present illness.    ROS All other systems reviewed and are negative.   PHYSICAL EXAM:   VS:  BP 122/72   Pulse 80   Ht 5\' 11"  (1.803 m)   Wt 275 lb 4 oz (124.9 kg)   SpO2 93%   BMI 38.39 kg/m    GEN: Well nourished, well developed, in no acute distress  HEENT: normal  Neck: no JVD, carotid bruits, or masses Cardiac: RRR; no murmurs, rubs, or gallops, trace bilateral lower extremity edema. Respiratory:  clear to auscultation bilaterally, normal work of breathing GI: soft, nontender, nondistended, + BS MS: no deformity or atrophy  Skin: warm and dry, no rash Neuro:  Alert and Oriented x 3, Strength and sensation are intact Psych: euthymic mood, full affect  Wt Readings from Last 3 Encounters:  04/13/17 275 lb 4 oz (124.9 kg)  03/16/17 272 lb (123.4 kg)  03/09/17 273 lb (123.8 kg)      Studies/Labs Reviewed:   EKG:  EKG is ordered today.  The ekg ordered today demonstrates normal sinus rhythm without acute changes. Recent Labs: 06/26/2016: TSH 2.020 07/17/2016: B Natriuretic Peptide 11.5; Hemoglobin 13.1; Platelets 116 12/07/2016: ALT 18; BUN 18; Creat 1.01; Potassium 4.4; Sodium 142   Lipid Panel    Component Value  Date/Time   CHOL 161 12/07/2016 0743   TRIG 80 12/07/2016 0743   HDL 73 12/07/2016 0743   CHOLHDL 2.2 12/07/2016 0743   VLDL 17 09/05/2014 0837   LDLCALC 69 09/05/2014 0837    Additional studies/ records that were reviewed today include:   Left Heart Cath and Coronary Angiography  10/2015  Conclusion    1st RPLB lesion, 80% stenosed. Small caliber branch vessel. No change from prior catheterization  Ost 3rd Diag lesion, 60% stenosed. Moderate caliber vessel. No change from prior catheterization  Dist LAD-2 lesion, 55% stenosed. Distal/apical LAD diffusely diseased. No change from last catheterization  Dist LAD-1 lesion, 40% stenosed in what appears to be a myocardial bridging segment. No change from prior catheterization  Ost Cx lesion, 40% stenosed. Also no change from prior catheterization  The left ventricular systolic function is normal.  Mildly elevated LVEDP of 20 mmHg. Likely diastolic dysfunction.   Graphically, relatively similar coronary artery disease compared to prior catheterization. Mild progression of small vessel branch disease. Normal EF, but mildly elevated LVEDP. Considers diastolic dysfunction. I don't think this would given the extent of dyspnea with exertion but he is having. Sign may consider low-dose diuretic, but I think major issues likely noncardiac.  Plan: Standard post radial cath care with TR band removal and discharge post bedrest. Continue follow-up with Dr. [name]. Consider low-dose diuretic. Also continue pulmonary medicine consultation.   Diagnostic Diagram         ASSESSMENT & PLAN:    1. CAD - Last cath 10/2015 as above.  Graphically, relatively similar coronary artery disease compared to prior catheterization. Mild progression of small vessel branch disease. On medical therapy.  No angina.  Continue aspirin, statin and imdur.   2. Acute on chronic diastolic CHF -Mild lower extremity edema.  Lungs clear.  Increase Lasix for a few  days then back to regular dose as described below.  If he feels  better on daily dose of Lasix, he will continue and let us know.  3. HTN -Stable and well controlled on current regimen  4. HLD - 12/07/2016: Cholesterol 161; HDL 73; Triglycerides 80.  LDL 72 -Continue  Lipitor 40 mg daily  5.  Surgical clearance -Patient was doing exercise 3 times per week for approximately 1.5 hours each time without any discomfort.  He has not done any exercise in the past 1 month due to knee issue, now requiring surgery.   - Given past medical history and time since last visit, based on ACC/AHA guidelines, SHRIYANS KUENZI would be at acceptable risk for the planned procedure without further cardiovascular testing.   Reviewed with Dr. [name] - Madaline Brilliant to hold ASA for 5-7 days.   6. OSA on CPAP -Compliant   Medication Adjustments/Labs and Tests Ordered: Current medicines are reviewed at length with the patient today.  Concerns regarding medicines are outlined above.  Medication changes, Labs and Tests ordered today are listed in the Patient Instructions below. Patient Instructions  Medication Instructions:  Your physician has recommended you make the following change in your medication:  1.  INCREASE the Lasix 40 mg to daily for 5 days then go back to every other day.  IF YOU FEEL BETTER TAKING IT EVERYDAY, CALL us AND LET us KNOW   Labwork: None ordered  Testing/Procedures: None ordered  Follow-Up: Your physician recommends that you schedule a follow-up appointment in: [place]   Any Other Special Instructions Will Be Listed Below (If Applicable).   YOU HAVE BEEN CLEARED FOR SURGERY. WE WILL FAX THE SURGEONS OFFICE TO LET THEM KNOW. GOOD LUCK WITH THE SURGERY.  HAPPY THANKSGIVING!!  If you need a refill on your cardiac medications before your next appointment, please call your pharmacy.      Jarrett Soho, [place]  04/13/2017 10:32 AM    [facility] [address], [place], [place]  [address] Phone: [phone]; Fax: [phone]

## 2017-04-13 NOTE — Patient Instructions (Signed)
Medication Instructions:  Your physician has recommended you make the following change in your medication:  1.  INCREASE the Lasix 40 mg to daily for 5 days then go back to every other day.  IF YOU FEEL BETTER TAKING IT EVERYDAY, CALL us AND LET us KNOW   Labwork: None ordered  Testing/Procedures: None ordered  Follow-Up: Your physician recommends that you schedule a follow-up appointment in: [place]   Any Other Special Instructions Will Be Listed Below (If Applicable).   YOU HAVE BEEN CLEARED FOR SURGERY. WE WILL FAX THE SURGEONS OFFICE TO LET THEM KNOW. GOOD LUCK WITH THE SURGERY.  HAPPY THANKSGIVING!!  If you need a refill on your cardiac medications before your next appointment, please call your pharmacy.

## 2017-04-14 ENCOUNTER — Other Ambulatory Visit: Payer: Self-pay | Admitting: Orthopedic Surgery

## 2017-04-15 ENCOUNTER — Encounter (HOSPITAL_COMMUNITY): Payer: Self-pay | Admitting: *Deleted

## 2017-04-15 ENCOUNTER — Other Ambulatory Visit: Payer: Self-pay

## 2017-04-15 ENCOUNTER — Other Ambulatory Visit (HOSPITAL_COMMUNITY): Payer: Self-pay | Admitting: *Deleted

## 2017-04-15 NOTE — Progress Notes (Signed)
Anesthesia Chart Review:  Patient is a same day work up.   Patient is an 83 year old male scheduled for left knee arthroscopy on 04/19/2017 with Dorna Leitz, M.D.  - PCP is Beatrice Lecher, MD - Cardiologist is [name], M.D. Pt cleared for surgery at last office visit 04/13/17 with Leanor Kail, PA  PMH includes: CAD (diffuse moderate by 11/25/15 cath), HTN, hyperlipidemia, COPD, OSA, hypothyroidism, GERD. Former smoker. BMI 38.  Medications include: Albuterol, ASA 81 mg, Lipitor, Lasix, DuoNeb, Imdur, levothyroxine, stiolto respimat. Pt to hold ASA day of surgery.  Labs will be obtained day of surgery  CXR 11/20/16: No active cardiopulmonary disease.  EKG 04/13/17: NSR. LAD.  Cardiac cath 11/25/15:   1st RPLB lesion, 80% stenosed. Small caliber branch vessel. No change from prior catheterization  Ost 3rd Diag lesion, 60% stenosed. Moderate caliber vessel. No change from prior catheterization  Dist LAD-2 lesion, 55% stenosed. Distal/apical LAD diffusely diseased. No change from last catheterization  Dist LAD-1 lesion, 40% stenosed in what appears to be a myocardial bridging segment. No change from prior catheterization  Ost Cx lesion, 40% stenosed. Also no change from prior catheterization  The left ventricular systolic function is normal.  Mildly elevated LVEDP of 20 mmHg. Likely diastolic dysfunction. - Graphically, relatively similar coronary artery disease compared to prior catheterization. Mild progression of small vessel branch disease. Normal EF, but mildly elevated LVEDP. Considers diastolic dysfunction. I don't think this would given the extent of dyspnea with exertion but he is having. Sign may consider low-dose diuretic, but I think major issues likely noncardiac.  If labs acceptable, I anticipate pt can proceed with surgery as scheduled.   Willeen Cass, FNP-BC [facility]/Anesthesiology Phone: [phone] 04/15/2017 7:09 PM

## 2017-04-15 NOTE — Progress Notes (Signed)
Gave patient pre-op instructions  Cardiologist: Dr. [name]  Sleep study at the [facility] in [place] - instructed patient to bring mask and tubing the morning of surgery Bring inhalers with him the morning of surgery  Per Patient Dr. Berenice Primas stated that he did not care if patient stopped Aspirin or not Cardiologist told him to not take it the morning of surgery -  This is what patient is doing  Will have anesthesia review cart

## 2017-04-16 MED ORDER — DEXTROSE 5 % IV SOLN
3.0000 g | INTRAVENOUS | Status: AC
  Administered 2017-04-19: 3 g via INTRAVENOUS
  Filled 2017-04-16: qty 3

## 2017-04-18 NOTE — H&P (Signed)
PREOPERATIVE H&P  Chief Complaint: left knee pain  HPI: Bryan Hatfield is a 83 y.o. male who presents for evaluation of Left knee pain. It has been present for Several months and has been worsening. He has failed conservative measures. Pain is rated as severe.  Past Medical History:  Diagnosis Date  . Acute respiratory failure ([place]) 09/25/11   hypoxic  . Anxiety   . CAD (coronary artery disease)    a. PTCA 2001. b. f/u cath 05/2012 heavy calcification, no PCI required. c. f/u cath for recurrent sx 2017: stable mod CAD unchanged from prior, mild progression of small vessel disease, elevated LVEDP.  . Colon polyps   . COPD (chronic obstructive pulmonary disease) ([place])   . GERD (gastroesophageal reflux disease)   . High cholesterol   . Hypertension   . Hypothyroidism   . Peripheral vascular disease ([place])    patient denies knowledge of, but listed in his chart  . Pleural effusion    Parapneumonic ?r/t PNA s/p thoracentesis 2008  . Pneumonia    hx  . Sleep apnea    cpap    Past Surgical History:  Procedure Laterality Date  . CATARACT EXTRACTION W/ INTRAOCULAR LENS  IMPLANT, BILATERAL  ~ 2006  . COLONOSCOPY    . CORONARY ANGIOPLASTY  01/2000  . HEMORRHOID SURGERY  1960's  . LAPAROSCOPIC CHOLECYSTECTOMY WITH INTRAOPERATIVE CHOLANGIOGRAM N/A 10/11/2012   Performed by Adin Hector, MD at [facility]  . Left Heart Cath and Coronary Angiography N/A 11/25/2015   Performed by Leonie Man, MD at [facility]   Social History   Socioeconomic History  . Marital status: Married    Spouse name: None  . Number of children: None  . Years of education: None  . Highest education level: None  Social Needs  . Financial resource strain: None  . Food insecurity - worry: None  . Food insecurity - inability: None  . Transportation needs - medical: None  . Transportation needs - non-medical: None  Occupational History  . 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 Smoking status: Former Smoker    Packs/day: 1.00    Years: 10.00    Pack years: 10.00    Types: Cigarettes    Last attempt to quit: 06/01/1982    Years since quitting: 34.9  . Smokeless tobacco: Never Used  . Tobacco comment: "quit smoking ~ 25 years ago; smoked ~ 1 pk/wk"  Substance and Sexual Activity  . Alcohol use: No  . Drug use: No  . Sexual activity: No  Other Topics Concern  . None  Social History Narrative  . None   Family History  Problem Relation Age of Onset  . Stroke Mother   . Arthritis Mother   . Asthma Mother   . Emphysema Mother   . Stroke Father   . Arthritis Sister   . Stroke Sister   . Heart attack Neg Hx    No Known Allergies Prior to Admission medications   Medication Sig Start Date End Date Taking? Authorizing Provider  albuterol (PROVENTIL HFA;VENTOLIN HFA) 108 (90 Base) MCG/ACT inhaler Inhale 2 puffs every 6 (six) hours as needed into the lungs for wheezing or shortness of breath.   Yes [provider]  Ascorbic Acid (VITAMIN C PO) Take 1 tablet by mouth daily.   Yes [provider]  aspirin EC 81 MG tablet Take 81 mg by mouth daily.  Yes [provider]  atorvastatin (LIPITOR) 40 MG tablet TAKE 1 TABLET BY MOUTH AT  BEDTIME 12/21/16  Yes [name], MD  escitalopram (LEXAPRO) 10 MG tablet TAKE 1 TABLET BY MOUTH  DAILY 03/01/17  Yes [name], MD  fluticasone [facility]) 50 MCG/ACT nasal spray One spray in each nostril twice a day, use left hand for right nostril, and right hand for left nostril. 04/21/16  Yes Silverio Decamp, MD  furosemide (LASIX) 40 MG tablet Take 1 tablet by mouth daily for 5 days then take 1 tablet by mouth every other day 04/13/17  Yes Bhagat, Bhavinkumar, PA  ipratropium-albuterol (DUONEB) 0.5-2.5 (3) MG/3ML SOLN USE 1 VIAL VIA NEBULIZER EVERY 2 HOURS AS NEEDED FOR WHEEZING, SHORTNESS OF BREATH. 02/12/17  Yes [name], MD  isosorbide mononitrate  (IMDUR) 30 MG 24 hr tablet Take 30 mg by mouth daily.   Yes [provider]  levothyroxine (SYNTHROID, LEVOTHROID) 112 MCG tablet TAKE 1 TABLET BY MOUTH  DAILY BEFORE BREAKFAST 03/08/17  Yes [name], MD  Multiple Vitamins-Minerals (ICAPS AREDS 2) CAPS Take 1 capsule 2 (two) times daily by mouth.   Yes [provider]  nitroGLYCERIN (NITROSTAT) 0.4 MG SL tablet DISSOLVE 1 TABLET UNDER THE TONGUE EVERY 5 MINUTES AS  NEEDED CHEST PAIN 04/08/17  Yes [name], MD  Tiotropium Bromide-Olodaterol (STIOLTO RESPIMAT) 2.5-2.5 MCG/ACT AERS Inhale 2 puffs into the lungs daily. 03/09/17  Yes [name], MD  traMADol (ULTRAM) 50 MG tablet Take 2 tablets (100 mg total) by mouth 3 (three) times daily as needed. Patient taking differently: Take 100 mg 3 (three) times daily as needed by mouth for moderate pain.  04/01/17  Yes Silverio Decamp, MD  triamcinolone cream (KENALOG) 0.1 % APPLY EXTERNALLY TO THE AFFECTED AREA DAILY FOR UP TO 2 WEEKS AS NEEDED. AVOID FACE Patient taking differently: APPLY EXTERNALLY TO THE AFFECTED AREA DAILY FOR UP TO 2 WEEKS AS NEEDED FOR ITCHING . AVOID FACE 03/22/17  Yes [name], MD  VESICARE 5 MG tablet Take 5 mg by mouth daily. 06/12/16  Yes [provider]  zolpidem (AMBIEN) 5 MG tablet Take 1 tablet (5 mg total) by mouth at bedtime as needed for sleep. Ok to refill 06/01/2016 Patient taking differently: Take 5 mg at bedtime by mouth.  04/28/16  Yes Metheney, Rene Kocher, MD  AMBULATORY NON FORMULARY MEDICATION Medication Name: 4 prong cane. Dx: gait instability 03/09/17   [name], MD  doxycycline (VIBRA-TABS) 100 MG tablet Take 1 tablet (100 mg total) by mouth 2 (two) times daily. Patient not taking: Reported on 04/15/2017 03/09/17   [name], MD  predniSONE (DELTASONE) 20 MG tablet Take 2 tablets (40 mg total) by mouth daily. Patient not taking: Reported on 04/15/2017 03/09/17    [name], MD     Positive ROS: none  All other systems have been reviewed and were otherwise negative with the exception of those mentioned in the HPI and as above.  Physical Exam: There were no vitals filed for this visit.  General: Alert, no acute distress Cardiovascular: No pedal edema Respiratory: No cyanosis, no use of accessory musculature GI: No organomegaly, abdomen is soft and non-tender Skin: No lesions in the area of chief complaint Neurologic: Sensation intact distally Psychiatric: Patient is competent for consent with normal mood and affect Lymphatic: No axillary or cervical lymphadenopathy  MUSCULOSKELETAL: Left knee: Painful range of motion.  Limited range of motion.  Trace effusion.  No instability.  MRI: MRI shows medial lateral meniscal tears with severe edema and the posterior medial tibial plateau area with questionable fracture and significant edema  Assessment/Plan: 1.LEFT KNEE LATERAL AND MEDIAL MENISCUS TEARS 2.Left posterior medialtibial plateau fracture Plan for Procedure(s): 1.ARTHROSCOPY KNEEwith debridement of medial lateral meniscal tears 2. Sub-chondroplasty left posterior medial tibial plateau fracture  The risks benefits and alternatives were discussed with the patient including but not limited to the risks of nonoperative treatment, versus surgical intervention including infection, bleeding, nerve injury, malunion, nonunion, hardware prominence, hardware failure, need for hardware removal, blood clots, cardiopulmonary complications, morbidity, mortality, among others, and they were willing to proceed.  Predicted outcome is good, although there will be at least a six to nine month expected recovery.  Alta Corning, MD 04/18/2017 5:26 PM

## 2017-04-19 ENCOUNTER — Ambulatory Visit (HOSPITAL_COMMUNITY): Payer: Medicare Other | Admitting: Emergency Medicine

## 2017-04-19 ENCOUNTER — Other Ambulatory Visit: Payer: Self-pay

## 2017-04-19 ENCOUNTER — Encounter (HOSPITAL_COMMUNITY)
Admission: RE | Disposition: A | Discharge status: Home / Self Care | Payer: Self-pay | Source: Ambulatory Visit | Attending: Orthopedic Surgery

## 2017-04-19 ENCOUNTER — Ambulatory Visit (HOSPITAL_COMMUNITY): Payer: Medicare Other

## 2017-04-19 ENCOUNTER — Ambulatory Visit (HOSPITAL_COMMUNITY)
Admission: RE | Admit: 2017-04-19 | Discharge: 2017-04-19 | Disposition: A | Discharge status: Home / Self Care | Payer: Medicare Other | Source: Ambulatory Visit | Attending: Orthopedic Surgery | Admitting: Orthopedic Surgery

## 2017-04-19 ENCOUNTER — Encounter (HOSPITAL_COMMUNITY): Payer: Self-pay | Admitting: *Deleted

## 2017-04-19 DIAGNOSIS — S83232A Complex tear of medial meniscus, current injury, left knee, initial encounter: Secondary | ICD-10-CM | POA: Insufficient documentation

## 2017-04-19 DIAGNOSIS — M94262 Chondromalacia, left knee: Secondary | ICD-10-CM | POA: Diagnosis not present

## 2017-04-19 DIAGNOSIS — S82192A Other fracture of upper end of left tibia, initial encounter for closed fracture: Secondary | ICD-10-CM | POA: Diagnosis not present

## 2017-04-19 DIAGNOSIS — E785 Hyperlipidemia, unspecified: Secondary | ICD-10-CM | POA: Insufficient documentation

## 2017-04-19 DIAGNOSIS — J449 Chronic obstructive pulmonary disease, unspecified: Secondary | ICD-10-CM | POA: Insufficient documentation

## 2017-04-19 DIAGNOSIS — F329 Major depressive disorder, single episode, unspecified: Secondary | ICD-10-CM | POA: Insufficient documentation

## 2017-04-19 DIAGNOSIS — S83282A Other tear of lateral meniscus, current injury, left knee, initial encounter: Secondary | ICD-10-CM | POA: Diagnosis not present

## 2017-04-19 DIAGNOSIS — M6752 Plica syndrome, left knee: Secondary | ICD-10-CM | POA: Diagnosis not present

## 2017-04-19 DIAGNOSIS — G4733 Obstructive sleep apnea (adult) (pediatric): Secondary | ICD-10-CM | POA: Insufficient documentation

## 2017-04-19 DIAGNOSIS — S83242A Other tear of medial meniscus, current injury, left knee, initial encounter: Secondary | ICD-10-CM | POA: Diagnosis not present

## 2017-04-19 DIAGNOSIS — I2510 Atherosclerotic heart disease of native coronary artery without angina pectoris: Secondary | ICD-10-CM | POA: Diagnosis not present

## 2017-04-19 DIAGNOSIS — Z7982 Long term (current) use of aspirin: Secondary | ICD-10-CM | POA: Insufficient documentation

## 2017-04-19 DIAGNOSIS — K219 Gastro-esophageal reflux disease without esophagitis: Secondary | ICD-10-CM | POA: Diagnosis not present

## 2017-04-19 DIAGNOSIS — Z7951 Long term (current) use of inhaled steroids: Secondary | ICD-10-CM | POA: Diagnosis not present

## 2017-04-19 DIAGNOSIS — Z87891 Personal history of nicotine dependence: Secondary | ICD-10-CM | POA: Insufficient documentation

## 2017-04-19 DIAGNOSIS — Z6838 Body mass index (BMI) 38.0-38.9, adult: Secondary | ICD-10-CM | POA: Diagnosis not present

## 2017-04-19 DIAGNOSIS — M84462A Pathological fracture, left tibia, initial encounter for fracture: Secondary | ICD-10-CM | POA: Diagnosis not present

## 2017-04-19 DIAGNOSIS — S82132A Displaced fracture of medial condyle of left tibia, initial encounter for closed fracture: Secondary | ICD-10-CM | POA: Diagnosis present

## 2017-04-19 DIAGNOSIS — F419 Anxiety disorder, unspecified: Secondary | ICD-10-CM | POA: Diagnosis not present

## 2017-04-19 DIAGNOSIS — X58XXXA Exposure to other specified factors, initial encounter: Secondary | ICD-10-CM | POA: Insufficient documentation

## 2017-04-19 DIAGNOSIS — M2242 Chondromalacia patellae, left knee: Secondary | ICD-10-CM | POA: Diagnosis not present

## 2017-04-19 DIAGNOSIS — Z9861 Coronary angioplasty status: Secondary | ICD-10-CM | POA: Insufficient documentation

## 2017-04-19 DIAGNOSIS — E7800 Pure hypercholesterolemia, unspecified: Secondary | ICD-10-CM | POA: Insufficient documentation

## 2017-04-19 DIAGNOSIS — Z419 Encounter for procedure for purposes other than remedying health state, unspecified: Secondary | ICD-10-CM

## 2017-04-19 DIAGNOSIS — M25562 Pain in left knee: Secondary | ICD-10-CM | POA: Diagnosis not present

## 2017-04-19 DIAGNOSIS — E039 Hypothyroidism, unspecified: Secondary | ICD-10-CM | POA: Diagnosis not present

## 2017-04-19 DIAGNOSIS — Z79899 Other long term (current) drug therapy: Secondary | ICD-10-CM | POA: Insufficient documentation

## 2017-04-19 DIAGNOSIS — I10 Essential (primary) hypertension: Secondary | ICD-10-CM | POA: Diagnosis not present

## 2017-04-19 HISTORY — DX: Hypothyroidism, unspecified: E03.9

## 2017-04-19 HISTORY — PX: KNEE ARTHROSCOPY: SHX127

## 2017-04-19 HISTORY — PX: KNEE ARTHROSCOPY WITH MEDIAL MENISECTOMY: SHX5651

## 2017-04-19 LAB — BASIC METABOLIC PANEL
ANION GAP: 8 (ref 5–15)
BUN: 17 mg/dL (ref 6–20)
CO2: 28 mmol/L (ref 22–32)
Calcium: 8.4 mg/dL — ABNORMAL LOW (ref 8.9–10.3)
Chloride: 104 mmol/L (ref 101–111)
Creatinine, Ser: 0.94 mg/dL (ref 0.61–1.24)
GFR calc Af Amer: >60 mL/min (ref >60)
GLUCOSE: 119 mg/dL — AB (ref 65–99)
POTASSIUM: 3.9 mmol/L (ref 3.5–5.1)
Sodium: 140 mmol/L (ref 135–145)

## 2017-04-19 LAB — HEMOGLOBIN A1C
Hgb A1c MFr Bld: 6.4 % — ABNORMAL HIGH (ref 4.8–5.6)
MEAN PLASMA GLUCOSE: 136.98 mg/dL

## 2017-04-19 LAB — CBC
HEMATOCRIT: 40.7 % (ref 39.0–52.0)
HEMOGLOBIN: 13.5 g/dL (ref 13.0–17.0)
MCH: 30.9 pg (ref 26.0–34.0)
MCHC: 33.2 g/dL (ref 30.0–36.0)
MCV: 93.1 fL (ref 78.0–100.0)
Platelets: 183 10*3/uL (ref 150–400)
RBC: 4.37 MIL/uL (ref 4.22–5.81)
RDW: 13.2 % (ref 11.5–15.5)
WBC: 7.8 10*3/uL (ref 4.0–10.5)

## 2017-04-19 SURGERY — ARTHROSCOPY, KNEE
Anesthesia: General | Site: Knee | Laterality: Left

## 2017-04-19 MED ORDER — OXYCODONE-ACETAMINOPHEN 5-325 MG PO TABS: 1.0000 | ORAL_TABLET | Freq: Four times a day (QID) | ORAL | 0 refills | Status: DC | PRN

## 2017-04-19 MED ORDER — SODIUM CHLORIDE 0.9 % IR SOLN
Status: DC | PRN
  Administered 2017-04-19 (×2): 3000 mL

## 2017-04-19 MED ORDER — ONDANSETRON HCL 4 MG/2ML IJ SOLN
INTRAMUSCULAR | Status: DC | PRN
  Administered 2017-04-19: 4 mg via INTRAVENOUS

## 2017-04-19 MED ORDER — EPHEDRINE SULFATE-NACL 50-0.9 MG/10ML-% IV SOSY
PREFILLED_SYRINGE | INTRAVENOUS | Status: DC | PRN
  Administered 2017-04-19: 10 mg via INTRAVENOUS
  Administered 2017-04-19: 5 mg via INTRAVENOUS

## 2017-04-19 MED ORDER — FENTANYL CITRATE (PF) 100 MCG/2ML IJ SOLN
INTRAMUSCULAR | Status: DC | PRN
  Administered 2017-04-19 (×3): 50 ug via INTRAVENOUS

## 2017-04-19 MED ORDER — PROPOFOL 10 MG/ML IV BOLUS
INTRAVENOUS | Status: DC | PRN
  Administered 2017-04-19: 20 mg via INTRAVENOUS
  Administered 2017-04-19: 10 mg via INTRAVENOUS
  Administered 2017-04-19: 150 mg via INTRAVENOUS

## 2017-04-19 MED ORDER — FENTANYL CITRATE (PF) 250 MCG/5ML IJ SOLN
INTRAMUSCULAR | Status: AC
  Filled 2017-04-19: qty 5

## 2017-04-19 MED ORDER — LIDOCAINE 2% (20 MG/ML) 5 ML SYRINGE
INTRAMUSCULAR | Status: DC | PRN
  Administered 2017-04-19: 100 mg via INTRAVENOUS

## 2017-04-19 MED ORDER — LACTATED RINGERS IV SOLN
Freq: Once | INTRAVENOUS | Status: AC
  Administered 2017-04-19: 12:00:00 via INTRAVENOUS

## 2017-04-19 MED ORDER — DEXAMETHASONE SODIUM PHOSPHATE 10 MG/ML IJ SOLN
INTRAMUSCULAR | Status: DC | PRN
  Administered 2017-04-19: 8 mg via INTRAVENOUS

## 2017-04-19 MED ORDER — ONDANSETRON HCL 4 MG/2ML IJ SOLN
INTRAMUSCULAR | Status: AC
  Filled 2017-04-19: qty 2

## 2017-04-19 MED ORDER — DEXAMETHASONE SODIUM PHOSPHATE 10 MG/ML IJ SOLN
INTRAMUSCULAR | Status: AC
  Filled 2017-04-19: qty 1

## 2017-04-19 MED ORDER — CHLORHEXIDINE GLUCONATE 4 % EX LIQD: 60.0000 mL | Freq: Once | CUTANEOUS | Status: DC

## 2017-04-19 MED ORDER — EPHEDRINE 5 MG/ML INJ
INTRAVENOUS | Status: AC
  Filled 2017-04-19: qty 10

## 2017-04-19 MED ORDER — FENTANYL CITRATE (PF) 100 MCG/2ML IJ SOLN: 25.0000 ug | INTRAMUSCULAR | Status: DC | PRN

## 2017-04-19 MED ORDER — BUPIVACAINE HCL (PF) 0.25 % IJ SOLN
INTRAMUSCULAR | Status: AC
  Filled 2017-04-19: qty 30

## 2017-04-19 MED ORDER — BUPIVACAINE HCL (PF) 0.25 % IJ SOLN
INTRAMUSCULAR | Status: DC | PRN
  Administered 2017-04-19: 20 mL

## 2017-04-19 MED ORDER — MIDAZOLAM HCL 2 MG/2ML IJ SOLN
INTRAMUSCULAR | Status: AC
  Filled 2017-04-19: qty 2

## 2017-04-19 MED ORDER — LACTATED RINGERS IV SOLN
INTRAVENOUS | Status: DC | PRN
  Administered 2017-04-19 (×2): via INTRAVENOUS

## 2017-04-19 MED ORDER — PROPOFOL 10 MG/ML IV BOLUS
INTRAVENOUS | Status: AC
  Filled 2017-04-19: qty 20

## 2017-04-19 SURGICAL SUPPLY — 42 items
BANDAGE ACE 6X5 VEL STRL LF (GAUZE/BANDAGES/DRESSINGS) ×3 IMPLANT
BLADE CUTTER GATOR 3.5 (BLADE) IMPLANT
BLADE GREAT WHITE 4.2 (BLADE) ×2 IMPLANT
BLADE GREAT WHITE 4.2MM (BLADE) ×1
BNDG GAUZE ELAST 4 BULKY (GAUZE/BANDAGES/DRESSINGS) ×3 IMPLANT
CUFF TOURNIQUET SINGLE 34IN LL (TOURNIQUET CUFF) ×2 IMPLANT
DRAPE ARTHROSCOPY W/POUCH 114 (DRAPES) ×3 IMPLANT
DRAPE C-ARM 42X72 X-RAY (DRAPES) ×2 IMPLANT
DRAPE C-ARMOR (DRAPES) ×2 IMPLANT
DRAPE HALF SHEET 40X57 (DRAPES) ×3 IMPLANT
DRAPE U-SHAPE 47X51 STRL (DRAPES) ×3 IMPLANT
DRSG EMULSION OIL 3X3 NADH (GAUZE/BANDAGES/DRESSINGS) ×3 IMPLANT
DRSG PAD ABDOMINAL 8X10 ST (GAUZE/BANDAGES/DRESSINGS) ×3 IMPLANT
DURAPREP 26ML APPLICATOR (WOUND CARE) ×3 IMPLANT
FILTER STRAW FLUID ASPIR (MISCELLANEOUS) ×3 IMPLANT
GAUZE SPONGE 4X4 12PLY STRL (GAUZE/BANDAGES/DRESSINGS) ×3 IMPLANT
GAUZE SPONGE 4X4 12PLY STRL LF (GAUZE/BANDAGES/DRESSINGS) ×2 IMPLANT
GLOVE BIOGEL PI IND STRL 8 (GLOVE) ×2 IMPLANT
GLOVE BIOGEL PI INDICATOR 8 (GLOVE) ×4
GLOVE ECLIPSE 7.5 STRL STRAW (GLOVE) ×6 IMPLANT
GOWN STRL REUS W/ TWL LRG LVL3 (GOWN DISPOSABLE) ×1 IMPLANT
GOWN STRL REUS W/ TWL XL LVL3 (GOWN DISPOSABLE) ×2 IMPLANT
GOWN STRL REUS W/TWL LRG LVL3 (GOWN DISPOSABLE) ×3
GOWN STRL REUS W/TWL XL LVL3 (GOWN DISPOSABLE) ×6
KIT BASIN OR (CUSTOM PROCEDURE TRAY) ×3 IMPLANT
KIT MIXER ACCUMIX (KITS) ×2 IMPLANT
KIT ROOM TURNOVER OR (KITS) ×3 IMPLANT
KNEE KIT SCP W/SIDE ACCUPORT (Joint) ×2 IMPLANT
NDL 18GX1X1/2 (RX/OR ONLY) (NEEDLE) ×1 IMPLANT
NEEDLE 18GX1X1/2 (RX/OR ONLY) (NEEDLE) IMPLANT
PACK ARTHROSCOPY DSU (CUSTOM PROCEDURE TRAY) ×3 IMPLANT
PAD ARMBOARD 7.5X6 YLW CONV (MISCELLANEOUS) ×6 IMPLANT
PAD CAST 4YDX4 CTTN HI CHSV (CAST SUPPLIES) ×2 IMPLANT
PADDING CAST COTTON 4X4 STRL (CAST SUPPLIES) ×6
PADDING CAST COTTON 6X4 STRL (CAST SUPPLIES) ×2 IMPLANT
SET ARTHROSCOPY TUBING (MISCELLANEOUS) ×3
SET ARTHROSCOPY TUBING LN (MISCELLANEOUS) ×1 IMPLANT
SUT ETHILON 4 0 PS 2 18 (SUTURE) ×3 IMPLANT
SYR 5ML LL (SYRINGE) ×3 IMPLANT
TOWEL OR 17X24 6PK STRL BLUE (TOWEL DISPOSABLE) ×3 IMPLANT
TOWEL OR 17X26 10 PK STRL BLUE (TOWEL DISPOSABLE) ×3 IMPLANT
WRAP KNEE MAXI GEL POST OP (GAUZE/BANDAGES/DRESSINGS) ×3 IMPLANT

## 2017-04-19 NOTE — Discharge Instructions (Signed)
You may weight-bear as tolerated on the left leg.  Ambulate with a walker. Wear knee immobilizer when up.  Try to keep an ice pack on your left knee as much as possible. You can bend your knee back and forth as tolerated. You will have increased pain in your knee for 2-3 days.  This is to be expected.

## 2017-04-19 NOTE — Transfer of Care (Signed)
Immediate Anesthesia Transfer of Care Note  Patient: Bryan Hatfield  Procedure(s) Performed: ARTHROSCOPY KNEE, SUBCHONDROPLASTY (Left Knee) KNEE ARTHROSCOPY WITH MEDIAL AND LATERAL MENISECTOMY (Left Knee)  Patient Location: PACU  Anesthesia Type:General  Level of Consciousness: awake, alert , patient cooperative and confused  Airway & Oxygen Therapy: Patient Spontanous Breathing and Patient connected to nasal cannula oxygen  Post-op Assessment: Report given to RN and Post -op Vital signs reviewed and stable  Post vital signs: Reviewed and stable  Last Vitals:  Vitals:   04/19/17 1109  BP: (!) 165/87  Pulse: 92  Resp: 18  Temp: 36.7 C  SpO2: 95%    Last Pain:  Vitals:   04/19/17 1109  TempSrc: Oral      Patients Stated Pain Goal: 3 (53/79/43 2761)  Complications: No apparent anesthesia complications

## 2017-04-19 NOTE — Anesthesia Preprocedure Evaluation (Addendum)
Anesthesia Evaluation  Patient identified by MRN, date of birth, ID band Patient awake    Reviewed: Allergy & Precautions, H&P , NPO status , Patient's Chart, lab work & pertinent test results  Airway Mallampati: II  TM Distance: >3 FB Neck ROM: Full    Dental no notable dental hx. (+) Edentulous Upper, Edentulous Lower, Dental Advisory Given   Pulmonary sleep apnea , COPD,  COPD inhaler, former smoker,    Pulmonary exam normal breath sounds clear to auscultation       Cardiovascular hypertension, Pt. on medications + CAD and + Peripheral Vascular Disease   Rhythm:Regular Rate:Normal     Neuro/Psych Anxiety Depression negative neurological ROS     GI/Hepatic Neg liver ROS, GERD  Medicated and Controlled,  Endo/Other  Hypothyroidism Morbid obesity  Renal/GU negative Renal ROS  negative genitourinary   Musculoskeletal  (+) Arthritis , Osteoarthritis,    Abdominal   Peds  Hematology negative hematology ROS (+)   Anesthesia Other Findings   Reproductive/Obstetrics negative OB ROS                            Anesthesia Physical Anesthesia Plan  ASA: III  Anesthesia Plan: General   Post-op Pain Management:    Induction: Intravenous  PONV Risk Score and Plan: 3 and Ondansetron, Dexamethasone and Treatment may vary due to age or medical condition  Airway Management Planned: LMA  Additional Equipment:   Intra-op Plan:   Post-operative Plan: Extubation in OR  Informed Consent: I have reviewed the patients History and Physical, chart, labs and discussed the procedure including the risks, benefits and alternatives for the proposed anesthesia with the patient or authorized representative who has indicated his/her understanding and acceptance.   Dental advisory given  Plan Discussed with: CRNA  Anesthesia Plan Comments:         Anesthesia Quick Evaluation

## 2017-04-19 NOTE — Progress Notes (Signed)
Orthopedic Tech Progress Note Patient Details:  Bryan Hatfield September 08, 1926 [id number]  Ortho Devices Type of Ortho Device: Knee Immobilizer Ortho Device/Splint Location: LLE Ortho Device/Splint Interventions: Ordered, Application   Braulio Bosch 04/19/2017, 3:50 PM

## 2017-04-19 NOTE — Brief Op Note (Signed)
04/19/2017  3:14 PM  PATIENT:  Bryan Hatfield  83 y.o. male  PRE-OPERATIVE DIAGNOSIS:  LEFT KNEE LATERAL AND MEDIAL MENISCUS TEARS  POST-OPERATIVE DIAGNOSIS:  LEFT KNEE LATERAL AND MEDIAL MENISCUS TEARS  PROCEDURE:  Procedure(s): ARTHROSCOPY KNEE, SUBCHONDROPLASTY (Left) KNEE ARTHROSCOPY WITH MEDIAL AND LATERAL MENISECTOMY (Left)  SURGEON:  Surgeon(s) and Role:    Dorna Leitz, MD - Primary  PHYSICIAN ASSISTANT:   ASSISTANTS: bethune   ANESTHESIA:   general  EBL:  None   BLOOD ADMINISTERED:none  DRAINS: none   LOCAL MEDICATIONS USED:  MARCAINE     SPECIMEN:  No Specimen  DISPOSITION OF SPECIMEN:  N/A  COUNTS:  YES  TOURNIQUET:  * No tourniquets in log *  DICTATION: .Other Dictation: Dictation Number [id number]  PLAN OF CARE: Discharge to home after PACU  PATIENT DISPOSITION:  PACU - hemodynamically stable.   Delay start of Pharmacological VTE agent (>24hrs) due to surgical blood loss or risk of bleeding: no

## 2017-04-20 ENCOUNTER — Telehealth: Payer: Self-pay | Admitting: Interventional Cardiology

## 2017-04-20 DIAGNOSIS — I10 Essential (primary) hypertension: Secondary | ICD-10-CM

## 2017-04-20 NOTE — Op Note (Signed)
NAME:  FONG, MCCARRY NO.:  [id number]  MEDICAL RECORD NO.:  [id number]  LOCATION:                                 FACILITY:  PHYSICIAN:  Alta Corning, M.D.        DATE OF BIRTH:  DATE OF PROCEDURE:  04/19/2017 DATE OF DISCHARGE:                              OPERATIVE REPORT   PREOPERATIVE DIAGNOSES: 1. Medial and lateral meniscal tears, left knee. 2. Subchondral fracture, medial tibial plateau.  POSTOPERATIVE DIAGNOSES: 1. Medial and lateral meniscal tears, left knee. 2. Medial shelf plica. 3. Chondromalacia medial and patellofemoral joint.  PROCEDURES: 1. Left knee arthroscopy with medial and lateral meniscal debridement. 2. Chondroplasty, medial and patellofemoral compartments. 3. Medial shelf plica excision. 4. Arthroscopic-assisted fixation of posteromedial tibial plateau     fracture. 5. Interpretation of multiple intraoperative fluoroscopic images.  SURGEON:  Alta Corning, MD.  ASSISTANT:  Gary Fleet, PA.  ANESTHESIA:  General.  BRIEF HISTORY:  Mr. Nixon is an 83 year old male with long history and significant complaints of left knee pain.  He had been treated conservatively for prolonged period of time and ultimately underwent MRI.  MRI showed acute or subacute medial and posteromedial insufficiency fracture associated with medial and lateral meniscal tears as well as significant chondromalacia.  After discussion and failure of conservative care, he was taken to the operating room for evaluation and fixation as needed.  DESCRIPTION OF PROCEDURE:  The patient was taken to the operating table. After adequate anesthesia was obtained with general anesthetic, the patient was placed supine on the operating table.  The left leg was prepped and draped in usual sterile fashion.  Following this, routine arthroscopic examination of the knee revealed there was a complex posterior horn medial meniscal tear which was debrided back to a smooth and  stable rim with straight biting forceps and upbiting forceps.  The remaining meniscal rim was contoured down with the suction shaver. Attention was turned to the ACL, normal.  Attention was turned to the lateral side where there was a midbody lateral meniscal tear, which was debrided back to a smooth and stable rim.  Attention was then turned to the medial compartment where there was significant chondromalacia, which was debrided.  Attention was turned back up to the patellofemoral compartment where there was debridement of the chondromalacia in this area.  There was area of exposed bone and he had been debrided down to bleeding bone when necessary.  At this point, the arthroscopic instruments were removed.  Attention was then turned towards the fluoroscopic localization of the posteromedial tibial plateau.  This was done and once this was done, a guidewire was drilled into place.  Once we were satisfied with the positioning, 50 mL of bone calcium phosphate cement was injected anterior, superior, and posterior in the area of subchondral insufficiency fracture.  Once the cement allowed to completely harden, the needle was removed and final images were taken. I had to put the scope back in to make sure there was no extravasation in the joints, we saw none.  At this point, the wounds were irrigated, suctioned dry and the portals were closed in the  standard fashion.  20 mL of 0.25% Marcaine was instilled in the knee for postoperative anesthesia.  Sterile compressive dressing was applied.  The patient was taken to recovery where she was noted be in satisfactory condition.  He will be allowed weightbearing as tolerated in a knee immobilizer.  I will see him back in the office in several days.     Alta Corning, M.D.   ______________________________ Alta Corning, M.D.    Corliss Skains  D:  04/19/2017  T:  04/19/2017  Job:  [id number]

## 2017-04-20 NOTE — Telephone Encounter (Signed)
New message   Patient wife returning call.  Patient wife calling to report patient feeling better after Lasix changed.

## 2017-04-20 NOTE — Telephone Encounter (Signed)
New message      *STAT* If patient is at the pharmacy, call can be transferred to refill team.   1. Which medications need to be refilled? (please list name of each medication and dose if known) furosemide (LASIX) 40 MG tablet  2. Which pharmacy/location (including street and city if local pharmacy) is medication to be sent to? [facility] [place]  3. Do they need a 30 day or 90 day supply? [place]

## 2017-04-20 NOTE — Anesthesia Postprocedure Evaluation (Signed)
Anesthesia Post Note  Patient: BYRL LATIN  Procedure(s) Performed: ARTHROSCOPY KNEE, SUBCHONDROPLASTY (Left Knee) KNEE ARTHROSCOPY WITH MEDIAL AND LATERAL MENISECTOMY (Left Knee)     Patient location during evaluation: PACU Anesthesia Type: General Level of consciousness: awake and alert Pain management: pain level controlled Vital Signs Assessment: post-procedure vital signs reviewed and stable Respiratory status: spontaneous breathing, nonlabored ventilation and respiratory function stable Cardiovascular status: blood pressure returned to baseline and stable Postop Assessment: no apparent nausea or vomiting Anesthetic complications: no    Last Vitals:  Vitals:   04/19/17 1540 04/19/17 1545  BP: (!) 145/80   Pulse: 81 84  Resp: 14 20  Temp: 36.9 C   SpO2: 97% 98%    Last Pain:  Vitals:   04/19/17 1109  TempSrc: Oral                 Vincy Feliz,W. EDMOND

## 2017-04-20 NOTE — Telephone Encounter (Signed)
Called pt and spoke with pt's wife to inform them that pt's medication was sent to his pharmacy as requested, with refills. Pt's wife verbalized understanding.

## 2017-04-20 NOTE — Telephone Encounter (Signed)
Pt seen by Robbie Lis, PA-C on 11/13 for surgical clearance.  Pt noted to have mild LE edema at appt.  Lasix was increased to 40mg  QD x 5 days and then pt was to go back to taking it QOD.  Pt was advised to call the office if feeling better and that we may leave him on Lasix 40mg  QD.  Spoke with wife, DPR on file.  She states that pt is doing much better.  Swelling and SOB have resolved.  Pt and wife feel like he would benefit continuing this daily.  Advised I would send message to Dr. [name] for review and advisement.    FYI-  Pt had BMET drawn at hospital yesterday at time of his surgery.

## 2017-04-21 ENCOUNTER — Encounter (HOSPITAL_COMMUNITY): Payer: Self-pay | Admitting: Orthopedic Surgery

## 2017-04-23 NOTE — Telephone Encounter (Signed)
Repeat BMET 1 month

## 2017-04-26 MED ORDER — FUROSEMIDE 40 MG PO TABS: 40.0000 mg | ORAL_TABLET | Freq: Every day | ORAL | 3 refills | Status: DC

## 2017-04-26 NOTE — Addendum Note (Signed)
Addended by: Loren Racer on: 04/26/2017 08:57 AM   Modules accepted: Orders

## 2017-04-26 NOTE — Telephone Encounter (Signed)
Clarified with Dr. [name], ok for pt to continue Furosemide 40mg  QD.  Spoke with wife and made her aware of this and need for labs in one month.  Scheduled pt for labs on 12/27.  Wife verbalized understanding and was in agreement with this plan.

## 2017-04-27 DIAGNOSIS — Z9889 Other specified postprocedural states: Secondary | ICD-10-CM | POA: Diagnosis not present

## 2017-05-05 ENCOUNTER — Other Ambulatory Visit: Payer: Medicare Other

## 2017-05-05 DIAGNOSIS — K6289 Other specified diseases of anus and rectum: Secondary | ICD-10-CM | POA: Diagnosis not present

## 2017-05-05 DIAGNOSIS — I10 Essential (primary) hypertension: Secondary | ICD-10-CM

## 2017-05-05 DIAGNOSIS — R933 Abnormal findings on diagnostic imaging of other parts of digestive tract: Secondary | ICD-10-CM | POA: Diagnosis not present

## 2017-05-06 ENCOUNTER — Ambulatory Visit: Payer: Medicare Other | Admitting: Physical Therapy

## 2017-05-06 ENCOUNTER — Encounter: Payer: Self-pay | Admitting: Physical Therapy

## 2017-05-06 DIAGNOSIS — R2689 Other abnormalities of gait and mobility: Secondary | ICD-10-CM | POA: Diagnosis not present

## 2017-05-06 DIAGNOSIS — M6281 Muscle weakness (generalized): Secondary | ICD-10-CM

## 2017-05-06 DIAGNOSIS — R600 Localized edema: Secondary | ICD-10-CM | POA: Diagnosis not present

## 2017-05-06 DIAGNOSIS — M25662 Stiffness of left knee, not elsewhere classified: Secondary | ICD-10-CM | POA: Diagnosis not present

## 2017-05-06 LAB — BASIC METABOLIC PANEL
BUN / CREAT RATIO: 19 (ref 10–24)
BUN: 18 mg/dL (ref 8–27)
CHLORIDE: 100 mmol/L (ref 96–106)
CO2: 29 mmol/L (ref 20–29)
Calcium: 8.7 mg/dL (ref 8.6–10.2)
Creatinine, Ser: 0.93 mg/dL (ref 0.76–1.27)
GFR calc non Af Amer: 73 mL/min/{1.73_m2} (ref >59)
GFR, EST AFRICAN AMERICAN: 84 mL/min/{1.73_m2} (ref >59)
Glucose: 72 mg/dL (ref 65–99)
POTASSIUM: 3.8 mmol/L (ref 3.5–5.2)
SODIUM: 143 mmol/L (ref 134–144)

## 2017-05-06 NOTE — Patient Instructions (Addendum)
Quad Set    With right leg bent, foot flat, slowly tighten muscles on left thigh of straight leg while counting out loud to _10___. Repeat with other leg. Repeat __10__ times. Do __2__ sessions per day.  Bridge    Lie back, legs bent. Inhale, pressing hips up. Keeping ribs in, lengthen lower back. Exhale, rolling down along spine from top. Repeat __10-20__ times. Do __2_ sessions per day.  KNEE: Flexion / Extension - Sitting    Sit at edge of surface, foot on towel or pillowcase. Bend land straighten left knee. _10__ reps per set, _2__ sets per day,Use opposite leg to increase knee flexion.  Long [employer]    Tighten muscle on front of left thigh and extend leg. Hold straight for _5-10___ seconds.  Repeat _10___ times. Do __2__ sessions per day.  Balance: Unilateral - do this next to a counter or holding on to walker.     Attempt to balance on left leg, eyes open. Hold _as long as able___ seconds. Repeat __10__ times per set. Do _1___ sets per session. Do __2__ sessions per day. Perform exercise with eyes closed.

## 2017-05-06 NOTE — Therapy (Signed)
[place], [place], [address] Phone: [phone]   Fax:  [phone]  Physical Therapy Evaluation  Patient Details  Name: Bryan Hatfield MRN: [id number] Date of Birth: [birth year] Referring Provider: Dr Dorna Leitz   Encounter Date: 05/06/2017  PT End of Session - 81/06/18 1532    Visit Number  1    Number of Visits  12    Date for PT Re-Evaluation  06/17/17    PT Start Time  6948    PT Stop Time  1546    PT Time Calculation (min)  59 min    Activity Tolerance  Patient tolerated treatment well       Past Medical History:  Diagnosis Date  . Acute respiratory failure ([place]) 09/25/11   hypoxic  . Anxiety   . CAD (coronary artery disease)    a. PTCA 2001. b. f/u cath 05/2012 heavy calcification, no PCI required. c. f/u cath for recurrent sx 2017: stable mod CAD unchanged from prior, mild progression of small vessel disease, elevated LVEDP.  . Colon polyps   . COPD (chronic obstructive pulmonary disease) ([place])   . GERD (gastroesophageal reflux disease)   . High cholesterol   . Hypertension   . Hypothyroidism   . Peripheral vascular disease ([place])    patient denies knowledge of, but listed in his chart  . Pleural effusion    Parapneumonic ?r/t PNA s/p thoracentesis 2008  . Pneumonia    hx  . Sleep apnea    cpap     Past Surgical History:  Procedure Laterality Date  . CARDIAC CATHETERIZATION N/A 11/25/2015   Procedure: Left Heart Cath and Coronary Angiography;  Surgeon: Leonie Man, MD;  Location: [facility];  Service: Cardiovascular;  Laterality: N/A;  . CATARACT EXTRACTION W/ INTRAOCULAR LENS  IMPLANT, BILATERAL  ~ 2006  . CHOLECYSTECTOMY N/A 10/11/2012   Procedure: LAPAROSCOPIC CHOLECYSTECTOMY WITH INTRAOPERATIVE CHOLANGIOGRAM;  Surgeon: Adin Hector, MD;  Location: [facility];  Service: General;  Laterality: N/A;  . COLONOSCOPY    . CORONARY ANGIOPLASTY  01/2000  . HEMORRHOID  SURGERY  1960's  . KNEE ARTHROSCOPY Left 04/19/2017   Procedure: ARTHROSCOPY KNEE, SUBCHONDROPLASTY;  Surgeon: Dorna Leitz, MD;  Location: [facility];  Service: Orthopedics;  Laterality: Left;  . KNEE ARTHROSCOPY WITH MEDIAL MENISECTOMY Left 04/19/2017   Procedure: KNEE ARTHROSCOPY WITH MEDIAL AND LATERAL MENISECTOMY;  Surgeon: Dorna Leitz, MD;  Location: [facility];  Service: Orthopedics;  Laterality: Left;    There were no vitals filed for this visit.   Subjective Assessment - 05/06/17 1500    Subjective  Pt reported knee pain starting the beginning of October after his Lt LE gave away,  He had HHPT for a month with no improvement, MRI showed torn mensicus.  Had elective Lt knee scope 04/19/17.  Currently  walking in the house with his walker and performing some knee exercises.     How long can you walk comfortably?  using walker currently limited to short community walks.      Diagnostic tests  MRI    Patient Stated Goals  return to I ambulation without assistive device and return to [facility] workouts.     Currently in Pain?  Yes    Pain Score  6     Pain Location  Knee    Pain Orientation  Left    Pain Descriptors / Indicators  Dull;Aching    Pain Type  Surgical pain  Pain Onset  More than a month ago    Pain Frequency  Constant    Aggravating Factors   random increase in pain especially when first getting up     Pain Relieving Factors  pain meds, ice         [facility] PT Assessment - 05/06/17 0001      Assessment   Medical Diagnosis  Lt knee scope     Referring Provider  Dr Dorna Leitz    Onset Date/Surgical Date  04/19/17    Next MD Visit  05/13/17    Prior Therapy  HHPT prior to surgery      Precautions   Precautions  -- limit lifting    Precaution Comments  no longer needing knee immobilizer      Balance Screen   Has the patient fallen in the past 6 months  No      [place] residence apartment    Living Arrangements  Spouse/significant  other    Home Layout  One level      Prior Function   Level of [place]  Retired    Leisure  go to the [employer]      Observation/Other Assessments   Focus on Therapeutic Outcomes (FOTO)   69% limited      Observation/Other Assessments-Edema    Edema  -- (+) Lt knee      Posture/Postural Control   Posture Comments  stand with bilat knee slight flexion      ROM / Strength   AROM / PROM / Strength  AROM;PROM;Strength      AROM   AROM Assessment Site  Knee    Right/Left Knee  Right;Left    Right Knee Extension  -2    Right Knee Flexion  130    Left Knee Extension  -9    Left Knee Flexion  110      PROM   PROM Assessment Site  Knee    Right/Left Knee  Left    Left Knee Extension  -4    Left Knee Flexion  115      Strength   Strength Assessment Site  Knee;Hip;Ankle    Right/Left Hip  Left    Left Hip Flexion  -- 5-/5    Left Hip ABduction  4+/5    Right/Left Knee  Left Rt WNL    Left Knee Flexion  4+/5    Left Knee Extension  4-/5      Flexibility   Soft Tissue Assessment /Muscle Length  -- tightness in LE musculature      Palpation   Patella mobility  hypomobile due to edema      Bed Mobility   Bed Mobility  -- Independent      Transfers   Transfers  -- bilat UE assist to stand ( his prior level)       Ambulation/Gait   Ambulation Distance (Feet)  -- observed in clinic    Assistive device  Rolling walker    Gait Pattern  Trunk flexed;Left flexed knee in stance;Right flexed knee in stance;Decreased hip/knee flexion - left             Objective measurements completed on examination: See above findings.      [facility] Adult PT Treatment/Exercise - 05/06/17 0001      Exercises   Exercises  Knee/Hip      Knee/Hip Exercises: Aerobic   Nustep  L4x5'  Knee/Hip Exercises: Seated   Long Arc Quad  Strengthening;Left;10 reps    Hamstring Curl  Left;AROM;10 reps      Knee/Hip Exercises: Supine   Quad Sets   Strengthening;Left;10 reps 10 sec holds    Bridges  10 reps pt reported some LBP with these      Modalities   Modalities  Vasopneumatic      Vasopneumatic   Number Minutes Vasopneumatic   15 minutes    Vasopnuematic Location   Knee Lt    Vasopneumatic Pressure  Medium    Vasopneumatic Temperature   3*             PT Education - 05/06/17 1526    Education provided  Yes    Education Details  HEP    Person(s) Educated  Patient    Methods  Explanation;Demonstration;Handout    Comprehension  Verbalized understanding          PT Long Term Goals - 05/06/17 1656      PT LONG TERM GOAL #1   Title  I with HEP to include return to [facility] ( 06/17/17)     Time  6    Period  Weeks    Status  New      PT LONG TERM GOAL #2   Title  improve Lt knee ROM =/better than -2 extension to 125 flexion to assist with transfers ( 06/17/17)     Time  6    Period  Weeks    Status  New      PT LONG TERM GOAL #3   Title  ambulate without an assistive device on level surfaces in home and in the community ( 06/17/17)     Time  6    Period  Weeks    Status  New      PT LONG TERM GOAL #4   Title  demo LE strength =/> 5-/5 to allow him to use gym equipment at the [facility] per his previous level ( 06/17/17)     Time  6    Period  Weeks    Status  New      PT LONG TERM GOAL #5   Title  improve FOTO =/< 52% limited, CK level ( 06/17/17)     Time  6    Period  Weeks    Status  New             Plan - 05/06/17 1654    Clinical Impression Statement  83 yo male who was very active prior to having an elective Lt knee scope on 04/19/17 for torn meniscus.  He is currently ambulating with a RW,wishes to return to no device.  He has decreased knee ROM, tightness in the LE's, along with weakness, edema and gait abnormalities.     Clinical Presentation  Stable    Clinical Decision Making  Low    Rehab Potential  Excellent    PT Frequency  2x / week    PT Duration  6 weeks    PT  Treatment/Interventions  Gait training;Neuromuscular re-education;Dry needling;Manual techniques;Functional mobility training;Moist Heat;Ultrasound;Therapeutic activities;Patient/family education;Taping;Vasopneumatic Device;Therapeutic exercise;Cryotherapy;Electrical Stimulation;Balance training;Passive range of motion    PT Next Visit Plan  increase Lt knee ROM, Lower body strengthening, gait training    Consulted and Agree with Plan of Care  Patient       Patient will benefit from skilled therapeutic intervention in order to improve the following deficits and impairments:  Abnormal gait, Pain, Decreased range  of motion, Decreased strength, Hypomobility, Decreased balance, Increased edema  Visit Diagnosis: Stiffness of left knee, not elsewhere classified - Plan: PT plan of care cert/re-cert  Muscle weakness (generalized) - Plan: PT plan of care cert/re-cert  Other abnormalities of gait and mobility - Plan: PT plan of care cert/re-cert  Localized edema - Plan: PT plan of care cert/re-cert  [facility] PT PB G-CODES - May 21, 2017 1659    Functional Assessment Tool Used   FOTO and professional judgement    Functional Limitations  Mobility: Walking and moving around    Mobility: Walking and Moving Around Current Status  At least 60 percent but less than 80 percent impaired, limited or restricted    Mobility: Walking and Moving Around Goal Status [id number]  At least 40 percent but less than 60 percent impaired, limited or restricted        Problem List Patient Active Problem List   Diagnosis Date Noted  . Acute medial meniscus tear of left knee 04/19/2017  . Fracture of medial portion of left tibial plateau 04/19/2017  . Primary osteoarthritis of left knee 03/01/2017  . Insomnia 02/19/2015  . GAD (generalized anxiety disorder) 11/21/2014  . Erythrasma 09/18/2014  . Weakness 09/17/2014  . Arterial hypotension 09/17/2014  . Subclinical hypothyroidism 09/07/2014  . Prediabetes 06/08/2013  .  Essential hypertension, benign 06/02/2013  . Hyperlipidemia 06/02/2013  . Depression 06/02/2013  . CAD S/P percutaneous coronary angioplasty 05/09/2012  . Morbid obesity ([place]) 09/25/2011  . COPD GOLD 0/I 09/21/2007  . Sleep apnea 09/20/2007    Jeral Pinch PT  2017-05-21, [place], [place], [address] Phone: [phone]   Fax:  [phone]  Name: URIAN MARTENSON MRN: [id number] Date of Birth: 1926/12/03

## 2017-05-12 ENCOUNTER — Ambulatory Visit (INDEPENDENT_AMBULATORY_CARE_PROVIDER_SITE_OTHER): Payer: Medicare Other | Admitting: Physical Therapy

## 2017-05-12 ENCOUNTER — Encounter: Payer: Self-pay | Admitting: Physical Therapy

## 2017-05-12 DIAGNOSIS — R2689 Other abnormalities of gait and mobility: Secondary | ICD-10-CM

## 2017-05-12 DIAGNOSIS — R600 Localized edema: Secondary | ICD-10-CM

## 2017-05-12 DIAGNOSIS — M25662 Stiffness of left knee, not elsewhere classified: Secondary | ICD-10-CM | POA: Diagnosis not present

## 2017-05-12 DIAGNOSIS — M6281 Muscle weakness (generalized): Secondary | ICD-10-CM | POA: Diagnosis not present

## 2017-05-12 NOTE — Patient Instructions (Addendum)
Chair Sitting    Sit at edge of seat, spine straight, one leg extended. Put a hand on each thigh and bend forward from the hip, keeping spine straight. Allow hand on extended leg to reach toward toes. Support upper body with other arm. Hold _30__ seconds. Repeat _2__ times per session. Do __2_ sessions per day.  Calf Stretch    Place hands on wall at shoulder height. Keeping back leg straight, bend front leg, feet pointing forward, heels flat on floor. Lean forward slightly until stretch is felt in calf of back leg. Hold stretch __30_ seconds, breathing slowly in and out. Repeat stretch with other leg back. Do _2__ sessions per day. Variation: Use chair or table for support.    [facility] at [facility] [place], [place] [address]  [phone] (office) [phone] (fax)

## 2017-05-12 NOTE — Therapy (Signed)
[place], [place], [address] Phone: [phone]   Fax:  [phone]  Physical Therapy Treatment  Patient Details  Name: Bryan Hatfield MRN: [id number] Date of Birth: 09-03-1926 Referring Provider: Dr. Dorna Leitz    Encounter Date: 05/12/2017  PT End of Session - 05/12/17 1521    Visit Number  2    Number of Visits  12    Date for PT Re-Evaluation  06/17/17    PT Start Time  5929    PT Stop Time  1608    PT Time Calculation (min)  53 min    Activity Tolerance  Patient tolerated treatment well    Behavior During Therapy  [facility] for tasks assessed/performed       Past Medical History:  Diagnosis Date  . Acute respiratory failure ([place]) 09/25/11   hypoxic  . Anxiety   . CAD (coronary artery disease)    a. PTCA 2001. b. f/u cath 05/2012 heavy calcification, no PCI required. c. f/u cath for recurrent sx 2017: stable mod CAD unchanged from prior, mild progression of small vessel disease, elevated LVEDP.  . Colon polyps   . COPD (chronic obstructive pulmonary disease) ([place])   . GERD (gastroesophageal reflux disease)   . High cholesterol   . Hypertension   . Hypothyroidism   . Peripheral vascular disease ([place])    patient denies knowledge of, but listed in his chart  . Pleural effusion    Parapneumonic ?r/t PNA s/p thoracentesis 2008  . Pneumonia    hx  . Sleep apnea    cpap     Past Surgical History:  Procedure Laterality Date  . CARDIAC CATHETERIZATION N/A 11/25/2015   Procedure: Left Heart Cath and Coronary Angiography;  Surgeon: Leonie Man, MD;  Location: [facility];  Service: Cardiovascular;  Laterality: N/A;  . CATARACT EXTRACTION W/ INTRAOCULAR LENS  IMPLANT, BILATERAL  ~ 2006  . CHOLECYSTECTOMY N/A 10/11/2012   Procedure: LAPAROSCOPIC CHOLECYSTECTOMY WITH INTRAOPERATIVE CHOLANGIOGRAM;  Surgeon: Adin Hector, MD;  Location: [place];  Service: General;  Laterality: N/A;  .  COLONOSCOPY    . CORONARY ANGIOPLASTY  01/2000  . HEMORRHOID SURGERY  1960's  . KNEE ARTHROSCOPY Left 04/19/2017   Procedure: ARTHROSCOPY KNEE, SUBCHONDROPLASTY;  Surgeon: Dorna Leitz, MD;  Location: [place];  Service: Orthopedics;  Laterality: Left;  . KNEE ARTHROSCOPY WITH MEDIAL MENISECTOMY Left 04/19/2017   Procedure: KNEE ARTHROSCOPY WITH MEDIAL AND LATERAL MENISECTOMY;  Surgeon: Dorna Leitz, MD;  Location: [place];  Service: Orthopedics;  Laterality: Left;    There were no vitals filed for this visit.  Subjective Assessment - 05/12/17 1522    Subjective  Pt ambulates into therapy with SPC.  He said he has been working on his exercises at home and ambulates in home/community with SPC. He complains he still has some swelling.     Patient Stated Goals  return to independent ambulation without assistive device and return to [facility] workouts.     Currently in Pain?  Yes    Pain Score  7  0 at rest. pain with movement.     Pain Location  Knee    Pain Orientation  Left    Pain Descriptors / Indicators  Dull;Aching    Aggravating Factors   moving knee     Pain Relieving Factors  ice, medication          [facility] PT Assessment - 05/12/17 0001  Assessment   Medical Diagnosis  Lt knee scope     Referring Provider  Dr. Dorna Leitz     Onset Date/Surgical Date  04/19/17    Next MD Visit  05/13/17      Observation/Other Assessments   Focus on Therapeutic Outcomes (FOTO)   55% limited      AROM   AROM Assessment Site  Knee    Right/Left Knee  Left    Left Knee Extension  -3    Left Knee Flexion  125         [facility] Adult PT Treatment/Exercise - 05/12/17 0001      Knee/Hip Exercises: Stretches   Passive Hamstring Stretch  Right;Left;2 reps;30 seconds seated with leg straight    Quad Stretch  Left;2 reps;30 seconds seated, foot under chair.     Gastroc Stretch  Left;Right;2 reps;20 seconds runners stretch; VC to not bounce.       Knee/Hip Exercises: Aerobic   Nustep  L5: 6.5 min   PTA present to discuss progress      Knee/Hip Exercises: Standing   Forward Step Up  Left;1 set;Hand Hold: 2;Limitations;15 reps 3" step    Forward Step Up Limitations  unable to tolerate 6" step    SLS  Lt - mulitple trials up to 2 seconds without UE support.  semi-tandem stance x 15 sec x 4 reps      Knee/Hip Exercises: Supine   Quad Sets  Strengthening;Left;1 set;5 reps    Heel Slides  Strengthening;1 set;5 reps    Straight Leg Raises  Strengthening;Left;1 set;10 reps      Vasopneumatic   Number Minutes Vasopneumatic   15 minutes    Vasopnuematic Location   Knee Lt    Vasopneumatic Pressure  Medium    Vasopneumatic Temperature   34 deg             PT Education - 05/12/17 1604    Education provided  Yes    Education Details  HEP, added HS and calf stretch .     Person(s) Educated  Patient    Methods  Explanation;Demonstration;Handout    Comprehension  Verbalized understanding;Returned demonstration          PT Long Term Goals - 05/12/17 1606      PT LONG TERM GOAL #1   Title  I with HEP to include return to [facility] ( 06/17/17)     Time  6    Period  Weeks      PT LONG TERM GOAL #2   Title  improve Lt knee ROM =/better than -2 extension to 125 flexion to assist with transfers ( 06/17/17)     Time  6    Period  Weeks    Status  Partially Met      PT LONG TERM GOAL #3   Title  ambulate without an assistive device on level surfaces in home and in the community ( 06/17/17)     Time  6    Period  Weeks    Status  On-going      PT LONG TERM GOAL #4   Title  demo LE strength =/> 5-/5 to allow him to use gym equipment at the [facility] per his previous level ( 06/17/17)     Time  6    Period  Weeks    Status  On-going      PT LONG TERM GOAL #5   Title  improve FOTO =/< 52% limited, CK level (  06/17/17)     Time  6    Period  Weeks    Status  On-going            Plan - 05/12/17 1600    Clinical Impression Statement  Pt improved his FOTO score from 69% limited to  55% limited.  His Lt knee ROM has improved to -3 ext, 125 deg flexion.  He continues with decreased LE strength and balance. He will benefit from continued PT intervention to maximize safety and functional mobility.     Rehab Potential  Excellent    PT Frequency  2x / week    PT Duration  6 weeks    PT Treatment/Interventions  Gait training;Neuromuscular re-education;Dry needling;Manual techniques;Functional mobility training;Moist Heat;Ultrasound;Therapeutic activities;Patient/family education;Taping;Vasopneumatic Device;Therapeutic exercise;Cryotherapy;Electrical Stimulation;Balance training;Passive range of motion    PT Next Visit Plan  increase Lt knee ROM, Lower body strengthening, gait training    Consulted and Agree with Plan of Care  Patient       Patient will benefit from skilled therapeutic intervention in order to improve the following deficits and impairments:  Abnormal gait, Pain, Decreased range of motion, Decreased strength, Hypomobility, Decreased balance, Increased edema  Visit Diagnosis: Stiffness of left knee, not elsewhere classified  Muscle weakness (generalized)  Other abnormalities of gait and mobility  Localized edema     Problem List Patient Active Problem List   Diagnosis Date Noted  . Acute medial meniscus tear of left knee 04/19/2017  . Fracture of medial portion of left tibial plateau 04/19/2017  . Primary osteoarthritis of left knee 03/01/2017  . Insomnia 02/19/2015  . GAD (generalized anxiety disorder) 11/21/2014  . Erythrasma 09/18/2014  . Weakness 09/17/2014  . Arterial hypotension 09/17/2014  . Subclinical hypothyroidism 09/07/2014  . Prediabetes 06/08/2013  . Essential hypertension, benign 06/02/2013  . Hyperlipidemia 06/02/2013  . Depression 06/02/2013  . CAD S/P percutaneous coronary angioplasty 05/09/2012  . Morbid obesity ([place]) 09/25/2011  . COPD GOLD 0/I 09/21/2007  . Sleep apnea 09/20/2007   Kerin Perna, PTA 05/12/17  [facility] [place], [place], [address] Phone: [phone]   Fax:  [phone]  Name: CORYDON SCHWEISS MRN: [id number] Date of Birth: 11/12/1926

## 2017-05-13 DIAGNOSIS — M25562 Pain in left knee: Secondary | ICD-10-CM | POA: Diagnosis not present

## 2017-05-14 ENCOUNTER — Ambulatory Visit (INDEPENDENT_AMBULATORY_CARE_PROVIDER_SITE_OTHER): Payer: Medicare Other | Admitting: Physical Therapy

## 2017-05-14 DIAGNOSIS — M25662 Stiffness of left knee, not elsewhere classified: Secondary | ICD-10-CM

## 2017-05-14 DIAGNOSIS — R600 Localized edema: Secondary | ICD-10-CM

## 2017-05-14 DIAGNOSIS — R2689 Other abnormalities of gait and mobility: Secondary | ICD-10-CM

## 2017-05-14 DIAGNOSIS — M6281 Muscle weakness (generalized): Secondary | ICD-10-CM | POA: Diagnosis not present

## 2017-05-14 NOTE — Therapy (Signed)
[place], [place], [address] Phone: [phone]   Fax:  [phone]  Physical Therapy Treatment  Patient Details  Name: Bryan Hatfield MRN: [id number] Date of Birth: Oct 28, 1926 Referring Provider: Dr. Dorna Leitz   Encounter Date: 05/14/2017  PT End of Session - 05/14/17 1153    Visit Number  3    Number of Visits  12    Date for PT Re-Evaluation  06/17/17    PT Start Time  1147    PT Stop Time  1243    PT Time Calculation (min)  56 min    Activity Tolerance  Patient tolerated treatment well;Patient limited by fatigue    Behavior During Therapy  [facility] for tasks assessed/performed       Past Medical History:  Diagnosis Date  . Acute respiratory failure ([place]) 09/25/11   hypoxic  . Anxiety   . CAD (coronary artery disease)    a. PTCA 2001. b. f/u cath 05/2012 heavy calcification, no PCI required. c. f/u cath for recurrent sx 2017: stable mod CAD unchanged from prior, mild progression of small vessel disease, elevated LVEDP.  . Colon polyps   . COPD (chronic obstructive pulmonary disease) ([place])   . GERD (gastroesophageal reflux disease)   . High cholesterol   . Hypertension   . Hypothyroidism   . Peripheral vascular disease ([place])    patient denies knowledge of, but listed in his chart  . Pleural effusion    Parapneumonic ?r/t PNA s/p thoracentesis 2008  . Pneumonia    hx  . Sleep apnea    cpap     Past Surgical History:  Procedure Laterality Date  . CARDIAC CATHETERIZATION N/A 11/25/2015   Procedure: Left Heart Cath and Coronary Angiography;  Surgeon: Leonie Man, MD;  Location: [facility];  Service: Cardiovascular;  Laterality: N/A;  . CATARACT EXTRACTION W/ INTRAOCULAR LENS  IMPLANT, BILATERAL  ~ 2006  . CHOLECYSTECTOMY N/A 10/11/2012   Procedure: LAPAROSCOPIC CHOLECYSTECTOMY WITH INTRAOPERATIVE CHOLANGIOGRAM;  Surgeon: Adin Hector, MD;  Location: [place];  Service: General;   Laterality: N/A;  . COLONOSCOPY    . CORONARY ANGIOPLASTY  01/2000  . HEMORRHOID SURGERY  1960's  . KNEE ARTHROSCOPY Left 04/19/2017   Procedure: ARTHROSCOPY KNEE, SUBCHONDROPLASTY;  Surgeon: Dorna Leitz, MD;  Location: [place];  Service: Orthopedics;  Laterality: Left;  . KNEE ARTHROSCOPY WITH MEDIAL MENISECTOMY Left 04/19/2017   Procedure: KNEE ARTHROSCOPY WITH MEDIAL AND LATERAL MENISECTOMY;  Surgeon: Dorna Leitz, MD;  Location: [place];  Service: Orthopedics;  Laterality: Left;    There were no vitals filed for this visit.  Subjective Assessment - 05/14/17 1156    Subjective  Pt ambulates into therapy with RW, "I just don't feel safe walking with that. I'm afraid I might fall" (due to rain).  Pt visited MD, things are going well.      Patient Stated Goals  return to independent ambulation without assistive device and return to [facility] workouts.     Currently in Pain?  Yes    Pain Score  5     Pain Location  Knee    Pain Orientation  Left    Pain Descriptors / Indicators  Aching    Aggravating Factors   moving knee    Pain Relieving Factors  ice, medication.          [facility] PT Assessment - 05/14/17 0001      Assessment   Medical Diagnosis  Lt knee scope     Referring Provider  Dr. Dorna Leitz    Onset Date/Surgical Date  04/19/17    Next MD Visit  06/03/17       [facility] Adult PT Treatment/Exercise - 05/14/17 0001      Knee/Hip Exercises: Stretches   Passive Hamstring Stretch  Right;Left;2 reps;30 seconds seated with leg straight    Quad Stretch  Left;2 reps;30 seconds seated, foot under chair.     Gastroc Stretch  Left;Right;20 seconds;3 reps runners stretch; VC to not bounce.       Knee/Hip Exercises: Aerobic   Recumbent Bike  L2: 5.5 min  PTA present to discuss progress      Knee/Hip Exercises: Standing   SLS  Lt SLS with light UE support on counter x 2 reps pt became light headed; resolved with seated rest.     Other Standing Knee Exercises  semi-tandem stance x 15 sec with  light UE support on counter x 2 reps each leg;  backwards gait, length of counter (with SBA for safety) and UE support on counter x 4 lengths- VC for increased step length.       Knee/Hip Exercises: Seated   Long Arc Quad  Strengthening;Left;10 reps;Weights;2 sets    [employer] Weight  2 lbs.    Marching  Both;1 set;20 reps;Weights    Marching Limitations  2#    Sit to Sand  5 reps;with UE support VC for hand placement for safety      Vasopneumatic   Number Minutes Vasopneumatic   15 minutes    Vasopnuematic Location   Knee Lt    Vasopneumatic Pressure  Medium    Vasopneumatic Temperature   34 deg                  PT Long Term Goals - 05/12/17 1606      PT LONG TERM GOAL #1   Title  I with HEP to include return to [facility] ( 06/17/17)     Time  6    Period  Weeks      PT LONG TERM GOAL #2   Title  improve Lt knee ROM =/better than -2 extension to 125 flexion to assist with transfers ( 06/17/17)     Time  6    Period  Weeks    Status  Partially Met      PT LONG TERM GOAL #3   Title  ambulate without an assistive device on level surfaces in home and in the community ( 06/17/17)     Time  6    Period  Weeks    Status  On-going      PT LONG TERM GOAL #4   Title  demo LE strength =/> 5-/5 to allow him to use gym equipment at the [facility] per his previous level ( 06/17/17)     Time  6    Period  Weeks    Status  On-going      PT LONG TERM GOAL #5   Title  improve FOTO =/< 52% limited, CK level ( 06/17/17)     Time  6    Period  Weeks    Status  On-going            Plan - 05/14/17 1221    Clinical Impression Statement  Pt reported light headedness with SLS exercise; resolved with seated rest. Pt requires short rest breaks due to fatigue.  He has decreased standing dynamic and  static balance; will benefit from continued work in this area.  Pt progressing towards established goals.     Rehab Potential  Excellent    PT Frequency  2x / week    PT Duration  6 weeks     PT Treatment/Interventions  Gait training;Neuromuscular re-education;Dry needling;Manual techniques;Functional mobility training;Moist Heat;Ultrasound;Therapeutic activities;Patient/family education;Taping;Vasopneumatic Device;Therapeutic exercise;Cryotherapy;Electrical Stimulation;Balance training;Passive range of motion    PT Next Visit Plan  increase Lt knee ROM, Lower body strengthening, gait training    Consulted and Agree with Plan of Care  Patient       Patient will benefit from skilled therapeutic intervention in order to improve the following deficits and impairments:  Abnormal gait, Pain, Decreased range of motion, Decreased strength, Hypomobility, Decreased balance, Increased edema  Visit Diagnosis: Stiffness of left knee, not elsewhere classified  Muscle weakness (generalized)  Other abnormalities of gait and mobility  Localized edema     Problem List Patient Active Problem List   Diagnosis Date Noted  . Acute medial meniscus tear of left knee 04/19/2017  . Fracture of medial portion of left tibial plateau 04/19/2017  . Primary osteoarthritis of left knee 03/01/2017  . Insomnia 02/19/2015  . GAD (generalized anxiety disorder) 11/21/2014  . Erythrasma 09/18/2014  . Weakness 09/17/2014  . Arterial hypotension 09/17/2014  . Subclinical hypothyroidism 09/07/2014  . Prediabetes 06/08/2013  . Essential hypertension, benign 06/02/2013  . Hyperlipidemia 06/02/2013  . Depression 06/02/2013  . CAD S/P percutaneous coronary angioplasty 05/09/2012  . Morbid obesity ([place]) 09/25/2011  . COPD GOLD 0/I 09/21/2007  . Sleep apnea 09/20/2007   Kerin Perna, PTA 05/14/17 12:41 PM  [place], [place], [address] Phone: [phone]   Fax:  [phone]  Name: Bryan Hatfield MRN: [id number] Date of Birth: 1927-02-22

## 2017-05-18 ENCOUNTER — Encounter: Payer: Self-pay | Admitting: Physical Therapy

## 2017-05-18 ENCOUNTER — Ambulatory Visit: Payer: Medicare Other | Admitting: Physical Therapy

## 2017-05-18 DIAGNOSIS — R2689 Other abnormalities of gait and mobility: Secondary | ICD-10-CM | POA: Diagnosis not present

## 2017-05-18 DIAGNOSIS — R600 Localized edema: Secondary | ICD-10-CM | POA: Diagnosis not present

## 2017-05-18 DIAGNOSIS — M25662 Stiffness of left knee, not elsewhere classified: Secondary | ICD-10-CM | POA: Diagnosis not present

## 2017-05-18 DIAGNOSIS — M6281 Muscle weakness (generalized): Secondary | ICD-10-CM | POA: Diagnosis not present

## 2017-05-18 NOTE — Therapy (Signed)
[place], [place], [address] Phone: [phone]   Fax:  [phone]  Physical Therapy Treatment  Patient Details  Name: Bryan Hatfield MRN: [id number] Date of Birth: 1926/07/04 Referring Provider: Dr. Dorna Leitz   Encounter Date: 05/18/2017  PT End of Session - 05/18/17 1153    Visit Number  4    Number of Visits  12    Date for PT Re-Evaluation  06/17/17    PT Start Time  1153    PT Stop Time  5300    PT Time Calculation (min)  42 min    Activity Tolerance  Patient tolerated treatment well       Past Medical History:  Diagnosis Date  . Acute respiratory failure ([place]) 09/25/11   hypoxic  . Anxiety   . CAD (coronary artery disease)    a. PTCA 2001. b. f/u cath 05/2012 heavy calcification, no PCI required. c. f/u cath for recurrent sx 2017: stable mod CAD unchanged from prior, mild progression of small vessel disease, elevated LVEDP.  . Colon polyps   . COPD (chronic obstructive pulmonary disease) ([place])   . GERD (gastroesophageal reflux disease)   . High cholesterol   . Hypertension   . Hypothyroidism   . Peripheral vascular disease ([place])    patient denies knowledge of, but listed in his chart  . Pleural effusion    Parapneumonic ?r/t PNA s/p thoracentesis 2008  . Pneumonia    hx  . Sleep apnea    cpap     Past Surgical History:  Procedure Laterality Date  . CARDIAC CATHETERIZATION N/A 11/25/2015   Procedure: Left Heart Cath and Coronary Angiography;  Surgeon: Leonie Man, MD;  Location: [facility];  Service: Cardiovascular;  Laterality: N/A;  . CATARACT EXTRACTION W/ INTRAOCULAR LENS  IMPLANT, BILATERAL  ~ 2006  . CHOLECYSTECTOMY N/A 10/11/2012   Procedure: LAPAROSCOPIC CHOLECYSTECTOMY WITH INTRAOPERATIVE CHOLANGIOGRAM;  Surgeon: Adin Hector, MD;  Location: [place];  Service: General;  Laterality: N/A;  . COLONOSCOPY    . CORONARY ANGIOPLASTY  01/2000  . HEMORRHOID  SURGERY  1960's  . KNEE ARTHROSCOPY Left 04/19/2017   Procedure: ARTHROSCOPY KNEE, SUBCHONDROPLASTY;  Surgeon: Dorna Leitz, MD;  Location: [place];  Service: Orthopedics;  Laterality: Left;  . KNEE ARTHROSCOPY WITH MEDIAL MENISECTOMY Left 04/19/2017   Procedure: KNEE ARTHROSCOPY WITH MEDIAL AND LATERAL MENISECTOMY;  Surgeon: Dorna Leitz, MD;  Location: [facility];  Service: Orthopedics;  Laterality: Left;    There were no vitals filed for this visit.  Subjective Assessment - 05/18/17 1153    Subjective  Pt reports he went to [employer] yesterday and walking all over, not feeling sore and didn't swell up afterwards, Pt wishes to return to the [facility] and do the nustep and bike    Patient Stated Goals  return to independent ambulation without assistive device and return to [facility] workouts.     Currently in Pain?  No/denies         [facility] PT Assessment - 05/18/17 0001      Assessment   Medical Diagnosis  Lt knee scope       AROM   Left Knee Extension  -2    Left Knee Flexion  127                  [facility] Adult PT Treatment/Exercise - 05/18/17 0001      Knee/Hip Exercises: Aerobic   Recumbent Bike  L2x6'  Knee/Hip Exercises: Standing   Heel Raises  20 reps;Both with HHA on rail    Lateral Step Up  Left;1 set;10 reps;Step Height: 6"    Forward Step Up  Left;Step Height: 6";10 reps;1 set bilat HHA    Forward Step Up Limitations  bilat HHA     SLS  Lt SLS with light UE support on counter x 10 reps      Knee/Hip Exercises: Seated   Long Arc Quad  Strengthening;Left;10 reps 10sec       Knee/Hip Exercises: Supine   Bridges  Strengthening;Left;2 sets;10 reps figure 4 lifts      Modalities   Modalities  Vasopneumatic      Vasopneumatic   Number Minutes Vasopneumatic   15 minutes    Vasopnuematic Location   Knee    Vasopneumatic Pressure  Medium    Vasopneumatic Temperature   3*                  PT Long Term Goals - 05/18/17 1217      PT LONG TERM GOAL #1    Title  I with HEP to include return to [facility] ( 06/17/17)     Status  On-going      PT LONG TERM GOAL #2   Title  improve Lt knee ROM =/better than -2 extension to 125 flexion to assist with transfers ( 06/17/17)     Status  Achieved      PT LONG TERM GOAL #3   Title  ambulate without an assistive device on level surfaces in home and in the community ( 06/17/17)     Status  On-going      PT LONG TERM GOAL #4   Title  demo LE strength =/> 5-/5 to allow him to use gym equipment at the [facility] per his previous level ( 06/17/17)     Status  On-going      PT LONG TERM GOAL #5   Title  improve FOTO =/< 52% limited, CK level ( 06/17/17)     Status  On-going            Plan - 05/18/17 1225    Clinical Impression Statement  Pt fatigued with harder standing ex today.  His balance is very poor and he has fair Lt quad contraction. His Lt knee ROM is improving and he has met this goal.  Pt is still ambulating with his walker as he doesn't feel safe without it. He was encouraged to return to the [facility] tomorrow and work on the nustep and bike, decreasing his intensity and time.      Rehab Potential  Excellent    PT Frequency  2x / week    PT Duration  6 weeks    PT Treatment/Interventions  Gait training;Neuromuscular re-education;Dry needling;Manual techniques;Functional mobility training;Moist Heat;Ultrasound;Therapeutic activities;Patient/family education;Taping;Vasopneumatic Device;Therapeutic exercise;Cryotherapy;Electrical Stimulation;Balance training;Passive range of motion    PT Next Visit Plan  work on [employer] quad activation, Lower body strengthening, gait training    Consulted and Agree with Plan of Care  Patient       Patient will benefit from skilled therapeutic intervention in order to improve the following deficits and impairments:  Abnormal gait, Pain, Decreased range of motion, Decreased strength, Hypomobility, Decreased balance, Increased edema  Visit Diagnosis: Stiffness of left knee, not  elsewhere classified  Muscle weakness (generalized)  Other abnormalities of gait and mobility  Localized edema     Problem List Patient Active Problem List   Diagnosis Date  Noted  . Acute medial meniscus tear of left knee 04/19/2017  . Fracture of medial portion of left tibial plateau 04/19/2017  . Primary osteoarthritis of left knee 03/01/2017  . Insomnia 02/19/2015  . GAD (generalized anxiety disorder) 11/21/2014  . Erythrasma 09/18/2014  . Weakness 09/17/2014  . Arterial hypotension 09/17/2014  . Subclinical hypothyroidism 09/07/2014  . Prediabetes 06/08/2013  . Essential hypertension, benign 06/02/2013  . Hyperlipidemia 06/02/2013  . Depression 06/02/2013  . CAD S/P percutaneous coronary angioplasty 05/09/2012  . Morbid obesity ([place]) 09/25/2011  . COPD GOLD 0/I 09/21/2007  . Sleep apnea 09/20/2007    Jeral Pinch PT  05/18/2017, 12:32 PM  [facility] [place], [place], [address] Phone: [phone]   Fax:  [phone]  Name: Bryan Hatfield MRN: [id number] Date of Birth: 28-Oct-1926

## 2017-05-20 ENCOUNTER — Encounter: Payer: Self-pay | Admitting: Physical Therapy

## 2017-05-20 ENCOUNTER — Ambulatory Visit (INDEPENDENT_AMBULATORY_CARE_PROVIDER_SITE_OTHER): Payer: Medicare Other | Admitting: Physical Therapy

## 2017-05-20 DIAGNOSIS — R600 Localized edema: Secondary | ICD-10-CM

## 2017-05-20 DIAGNOSIS — R2689 Other abnormalities of gait and mobility: Secondary | ICD-10-CM | POA: Diagnosis not present

## 2017-05-20 DIAGNOSIS — M6281 Muscle weakness (generalized): Secondary | ICD-10-CM

## 2017-05-20 DIAGNOSIS — M25662 Stiffness of left knee, not elsewhere classified: Secondary | ICD-10-CM | POA: Diagnosis not present

## 2017-05-20 NOTE — Therapy (Signed)
[facility] [place], [place], [address] Phone: [phone]   Fax:  [phone]  Physical Therapy Treatment  Patient Details  Name: Bryan Hatfield MRN: [id number] Date of Birth: [birth year] Referring Provider: Dr. Dorna Leitz    Encounter Date: 05/20/2017  PT End of Session - 05/20/17 1409    Visit Number  5    Number of Visits  12    Date for PT Re-Evaluation  06/17/17    PT Start Time  1402    PT Stop Time  1458    PT Time Calculation (min)  56 min    Activity Tolerance  Patient limited by fatigue;Patient tolerated treatment well    Behavior During Therapy  [facility] for tasks assessed/performed       Past Medical History:  Diagnosis Date  . Acute respiratory failure ([place]) 09/25/11   hypoxic  . Anxiety   . CAD (coronary artery disease)    a. PTCA 2001. b. f/u cath 05/2012 heavy calcification, no PCI required. c. f/u cath for recurrent sx 2017: stable mod CAD unchanged from prior, mild progression of small vessel disease, elevated LVEDP.  . Colon polyps   . COPD (chronic obstructive pulmonary disease) ([place])   . GERD (gastroesophageal reflux disease)   . High cholesterol   . Hypertension   . Hypothyroidism   . Peripheral vascular disease ([place])    patient denies knowledge of, but listed in his chart  . Pleural effusion    Parapneumonic ?r/t PNA s/p thoracentesis 2008  . Pneumonia    hx  . Sleep apnea    cpap     Past Surgical History:  Procedure Laterality Date  . CARDIAC CATHETERIZATION N/A 11/25/2015   Procedure: Left Heart Cath and Coronary Angiography;  Surgeon: Leonie Man, MD;  Location: [facility];  Service: Cardiovascular;  Laterality: N/A;  . CATARACT EXTRACTION W/ INTRAOCULAR LENS  IMPLANT, BILATERAL  ~ 2006  . CHOLECYSTECTOMY N/A 10/11/2012   Procedure: LAPAROSCOPIC CHOLECYSTECTOMY WITH INTRAOPERATIVE CHOLANGIOGRAM;  Surgeon: Adin Hector, MD;  Location: [facility];  Service: General;   Laterality: N/A;  . COLONOSCOPY    . CORONARY ANGIOPLASTY  01/2000  . HEMORRHOID SURGERY  1960's  . KNEE ARTHROSCOPY Left 04/19/2017   Procedure: ARTHROSCOPY KNEE, SUBCHONDROPLASTY;  Surgeon: Dorna Leitz, MD;  Location: [facility];  Service: Orthopedics;  Laterality: Left;  . KNEE ARTHROSCOPY WITH MEDIAL MENISECTOMY Left 04/19/2017   Procedure: KNEE ARTHROSCOPY WITH MEDIAL AND LATERAL MENISECTOMY;  Surgeon: Dorna Leitz, MD;  Location: No Name;  Service: Orthopedics;  Laterality: Left;    There were no vitals filed for this visit.  Subjective Assessment - 05/20/17 1409    Subjective  "I reckon I overdid it yesterday".   He had gone to [facility] yesterday and rode [employer] L5 for 20 min.  He had planned on riding bicycle as well, but there wasn't a free one. His Lt knee is sore today.      Patient Stated Goals  return to independent ambulation without assistive device and return to [facility] workouts.     Currently in Pain?  Yes    Pain Score  7     Pain Location  Knee    Pain Orientation  Left    Pain Descriptors / Indicators  Aching    Aggravating Factors   overdoing it    Pain Relieving Factors  ice, medication         [facility] PT Assessment -  05/20/17 0001      Assessment   Medical Diagnosis  Lt knee scope     Referring Provider  Dr. Dorna Leitz     Onset Date/Surgical Date  04/19/17    Next MD Visit  06/03/17          [facility] Adult PT Treatment/Exercise - 05/20/17 0001      Knee/Hip Exercises: Stretches   Passive Hamstring Stretch  Right;Left;2 reps;30 seconds seated with leg straight      Knee/Hip Exercises: Aerobic   Recumbent Bike  L1-2 x6 min  PTA present to discuss progress      Knee/Hip Exercises: Standing   Heel Raises  Both;1 set;10 reps UE support on walker. 2 sets    Lateral Step Up  Left;1 set;10 reps;Step Height: 6";Hand Hold: 2    Stairs  reciprocal step pattern up/down 3" step x 5 reps    Other Standing Knee Exercises  side stepping with increased step height Rt/Lt x 8 ft x  2 reps - with light UE support on counter.      Other Standing Knee Exercises  semi-tandem forward gait followed by retrogait with light UE support on counter x 10 ft x 2 reps each direction.       Knee/Hip Exercises: Seated   Long Arc Quad  Strengthening;Left;10 reps 10sec     Sit to [employer]  2 sets;5 reps;without UE support      Vasopneumatic   Number Minutes Vasopneumatic   15 minutes    Vasopnuematic Location   Knee Lt    Vasopneumatic Pressure  Low    Vasopneumatic Temperature   3*         PT Long Term Goals - 05/20/17 1416      PT LONG TERM GOAL #1   Title  I with HEP to include return to [facility] ( 06/17/17)     Time  6    Period  Weeks    Status  Partially Met      PT LONG TERM GOAL #2   Title  improve Lt knee ROM =/better than -2 extension to 125 flexion to assist with transfers ( 06/17/17)     Time  6    Period  Weeks    Status  Achieved      PT LONG TERM GOAL #3   Title  ambulate without an assistive device on level surfaces in home and in the community ( 06/17/17)     Time  6    Period  Weeks    Status  Partially Met      PT LONG TERM GOAL #4   Title  demo LE strength =/> 5-/5 to allow him to use gym equipment at the [facility] per his previous level ( 06/17/17)     Time  6    Period  Weeks    Status  On-going      PT LONG TERM GOAL #5   Title  improve FOTO =/< 52% limited, CK level ( 06/17/17)     Time  6    Period  Weeks    Status  On-going            Plan - 05/20/17 1420    Clinical Impression Statement  Pt is no longer using assistive device in his home, but uses rolling walker in community due to weather conditions and verbalizing fear of falling. He continues to fatigue quickly and shortness of breath with standing exercise; requires short rest breaks between  exercises.  Pt making good gains towards remaining goals.     Rehab Potential  Excellent    PT Frequency  2x / week    PT Duration  6 weeks    PT Treatment/Interventions  Gait training;Neuromuscular  re-education;Dry needling;Manual techniques;Functional mobility training;Moist Heat;Ultrasound;Therapeutic activities;Patient/family education;Taping;Vasopneumatic Device;Therapeutic exercise;Cryotherapy;Electrical Stimulation;Balance training;Passive range of motion    PT Next Visit Plan  assess goals, including MMT; FOTO for upcoming MD appt.      Consulted and Agree with Plan of Care  Patient       Patient will benefit from skilled therapeutic intervention in order to improve the following deficits and impairments:  Abnormal gait, Pain, Decreased range of motion, Decreased strength, Hypomobility, Decreased balance, Increased edema  Visit Diagnosis: Stiffness of left knee, not elsewhere classified  Muscle weakness (generalized)  Other abnormalities of gait and mobility  Localized edema     Problem List Patient Active Problem List   Diagnosis Date Noted  . Acute medial meniscus tear of left knee 04/19/2017  . Fracture of medial portion of left tibial plateau 04/19/2017  . Primary osteoarthritis of left knee 03/01/2017  . Insomnia 02/19/2015  . GAD (generalized anxiety disorder) 11/21/2014  . Erythrasma 09/18/2014  . Weakness 09/17/2014  . Arterial hypotension 09/17/2014  . Subclinical hypothyroidism 09/07/2014  . Prediabetes 06/08/2013  . Essential hypertension, benign 06/02/2013  . Hyperlipidemia 06/02/2013  . Depression 06/02/2013  . CAD S/P percutaneous coronary angioplasty 05/09/2012  . Morbid obesity ([place]) 09/25/2011  . COPD GOLD 0/I 09/21/2007  . Sleep apnea 09/20/2007   Kerin Perna, PTA 05/20/17 2:55 PM  [facility] [place], [place], [address] Phone: [phone]   Fax:  [phone]  Name: EDD REPPERT MRN: [id number] Date of Birth: 27-Nov-1926

## 2017-05-23 ENCOUNTER — Encounter (HOSPITAL_COMMUNITY): Payer: Self-pay

## 2017-05-23 ENCOUNTER — Emergency Department (HOSPITAL_COMMUNITY)
Admission: EM | Admit: 2017-05-23 | Discharge: 2017-05-23 | Disposition: A | Discharge status: Home / Self Care | Payer: Medicare Other | Attending: Emergency Medicine | Admitting: Emergency Medicine

## 2017-05-23 ENCOUNTER — Emergency Department (HOSPITAL_COMMUNITY): Payer: Medicare Other

## 2017-05-23 DIAGNOSIS — R0602 Shortness of breath: Secondary | ICD-10-CM | POA: Insufficient documentation

## 2017-05-23 DIAGNOSIS — Z7982 Long term (current) use of aspirin: Secondary | ICD-10-CM | POA: Diagnosis not present

## 2017-05-23 DIAGNOSIS — I10 Essential (primary) hypertension: Secondary | ICD-10-CM | POA: Diagnosis not present

## 2017-05-23 DIAGNOSIS — J449 Chronic obstructive pulmonary disease, unspecified: Secondary | ICD-10-CM | POA: Insufficient documentation

## 2017-05-23 DIAGNOSIS — I2510 Atherosclerotic heart disease of native coronary artery without angina pectoris: Secondary | ICD-10-CM | POA: Insufficient documentation

## 2017-05-23 DIAGNOSIS — E039 Hypothyroidism, unspecified: Secondary | ICD-10-CM | POA: Insufficient documentation

## 2017-05-23 DIAGNOSIS — Z79899 Other long term (current) drug therapy: Secondary | ICD-10-CM | POA: Diagnosis not present

## 2017-05-23 DIAGNOSIS — Z87891 Personal history of nicotine dependence: Secondary | ICD-10-CM | POA: Insufficient documentation

## 2017-05-23 LAB — BASIC METABOLIC PANEL
ANION GAP: 9 (ref 5–15)
BUN: 19 mg/dL (ref 6–20)
CALCIUM: 8.8 mg/dL — AB (ref 8.9–10.3)
CO2: 24 mmol/L (ref 22–32)
CREATININE: 0.86 mg/dL (ref 0.61–1.24)
Chloride: 105 mmol/L (ref 101–111)
Glucose, Bld: 140 mg/dL — ABNORMAL HIGH (ref 65–99)
Potassium: 4.2 mmol/L (ref 3.5–5.1)
SODIUM: 138 mmol/L (ref 135–145)

## 2017-05-23 LAB — CBC
HEMATOCRIT: 39.2 % (ref 39.0–52.0)
Hemoglobin: 12.5 g/dL — ABNORMAL LOW (ref 13.0–17.0)
MCH: 30.2 pg (ref 26.0–34.0)
MCHC: 31.9 g/dL (ref 30.0–36.0)
MCV: 94.7 fL (ref 78.0–100.0)
PLATELETS: 166 10*3/uL (ref 150–400)
RBC: 4.14 MIL/uL — ABNORMAL LOW (ref 4.22–5.81)
RDW: 13.8 % (ref 11.5–15.5)
WBC: 9.6 10*3/uL (ref 4.0–10.5)

## 2017-05-23 LAB — BRAIN NATRIURETIC PEPTIDE: B NATRIURETIC PEPTIDE 5: 117 pg/mL — AB (ref 0.0–100.0)

## 2017-05-23 MED ORDER — IPRATROPIUM-ALBUTEROL 0.5-2.5 (3) MG/3ML IN SOLN
3.0000 mL | Freq: Once | RESPIRATORY_TRACT | Status: AC
  Administered 2017-05-23: 3 mL via RESPIRATORY_TRACT
  Filled 2017-05-23: qty 3

## 2017-05-23 MED ORDER — PREDNISONE 20 MG PO TABS: 40.0000 mg | ORAL_TABLET | Freq: Every day | ORAL | 0 refills | Status: DC

## 2017-05-23 MED ORDER — FUROSEMIDE 10 MG/ML IJ SOLN
40.0000 mg | Freq: Once | INTRAMUSCULAR | Status: AC
  Administered 2017-05-23: 40 mg via INTRAVENOUS
  Filled 2017-05-23: qty 4

## 2017-05-23 MED ORDER — PREDNISONE 20 MG PO TABS
60.0000 mg | ORAL_TABLET | Freq: Once | ORAL | Status: AC
  Administered 2017-05-23: 60 mg via ORAL
  Filled 2017-05-23: qty 3

## 2017-05-23 NOTE — ED Triage Notes (Signed)
Pt states that he has had SOB for the past several weeks, had a CT of his chest with his PCP that was negative, states SOB is getting worse and he can not take a deep breath, recent knee surgery

## 2017-05-23 NOTE — ED Provider Notes (Signed)
[facility] EMERGENCY DEPARTMENT Provider Note   CSN: [id number] Arrival date & time: 05/23/17  7510     History   Chief Complaint Chief Complaint  Patient presents with  . Shortness of Breath    HPI Bryan Hatfield is a 83 y.o. male.  HPI  This is an 83 year old male with a history of coronary artery disease, COPD who presents with shortness of breath.  Patient reports ongoing and worsening shortness of breath over the last several months.  He has been seen by his pulmonologist.  He reports having a CT scan to rule out blood clots within the last 2 weeks which was negative.  He states that he was at home this morning when he got short of breath.  He took a nebulizer treatment and had some improvement.  However, shortness of breath returned and he required another treatment.  At baseline he has 2 pillow orthopnea.  No worsening lower extremity edema.  He takes Lasix daily.  Denies any chest pain.  Denies any recent fevers or cough.  Past Medical History:  Diagnosis Date  . Acute respiratory failure ([place]) 09/25/11   hypoxic  . Anxiety   . CAD (coronary artery disease)    a. PTCA 2001. b. f/u cath 05/2012 heavy calcification, no PCI required. c. f/u cath for recurrent sx 2017: stable mod CAD unchanged from prior, mild progression of small vessel disease, elevated LVEDP.  . Colon polyps   . COPD (chronic obstructive pulmonary disease) ([place])   . GERD (gastroesophageal reflux disease)   . High cholesterol   . Hypertension   . Hypothyroidism   . Peripheral vascular disease ([place])    patient denies knowledge of, but listed in his chart  . Pleural effusion    Parapneumonic ?r/t PNA s/p thoracentesis 2008  . Pneumonia    hx  . Sleep apnea    cpap     Patient Active Problem List   Diagnosis Date Noted  . Acute medial meniscus tear of left knee 04/19/2017  . Fracture of medial portion of left tibial plateau 04/19/2017  . Primary osteoarthritis of left knee  03/01/2017  . Insomnia 02/19/2015  . GAD (generalized anxiety disorder) 11/21/2014  . Erythrasma 09/18/2014  . Weakness 09/17/2014  . Arterial hypotension 09/17/2014  . Subclinical hypothyroidism 09/07/2014  . Prediabetes 06/08/2013  . Essential hypertension, benign 06/02/2013  . Hyperlipidemia 06/02/2013  . Depression 06/02/2013  . CAD S/P percutaneous coronary angioplasty 05/09/2012  . Morbid obesity ([place]) 09/25/2011  . COPD GOLD 0/I 09/21/2007  . Sleep apnea 09/20/2007    Past Surgical History:  Procedure Laterality Date  . CARDIAC CATHETERIZATION N/A 11/25/2015   Procedure: Left Heart Cath and Coronary Angiography;  Surgeon: Leonie Man, MD;  Location: [facility];  Service: Cardiovascular;  Laterality: N/A;  . CATARACT EXTRACTION W/ INTRAOCULAR LENS  IMPLANT, BILATERAL  ~ 2006  . CHOLECYSTECTOMY N/A 10/11/2012   Procedure: LAPAROSCOPIC CHOLECYSTECTOMY WITH INTRAOPERATIVE CHOLANGIOGRAM;  Surgeon: Adin Hector, MD;  Location: [facility];  Service: General;  Laterality: N/A;  . COLONOSCOPY    . CORONARY ANGIOPLASTY  01/2000  . HEMORRHOID SURGERY  1960's  . KNEE ARTHROSCOPY Left 04/19/2017   Procedure: ARTHROSCOPY KNEE, SUBCHONDROPLASTY;  Surgeon: Dorna Leitz, MD;  Location: [facility];  Service: Orthopedics;  Laterality: Left;  . KNEE ARTHROSCOPY WITH MEDIAL MENISECTOMY Left 04/19/2017   Procedure: KNEE ARTHROSCOPY WITH MEDIAL AND LATERAL MENISECTOMY;  Surgeon: Dorna Leitz, MD;  Location: [facility];  Service:  Orthopedics;  Laterality: Left;       Home Medications    Prior to Admission medications   Medication Sig Start Date End Date Taking? Authorizing Provider  albuterol (PROVENTIL HFA;VENTOLIN HFA) 108 (90 Base) MCG/ACT inhaler Inhale 2 puffs every 6 (six) hours as needed into the lungs for wheezing or shortness of breath.    [provider]  AMBULATORY NON FORMULARY MEDICATION Medication Name: 4 prong cane. Dx: gait instability 03/09/17   [name],  MD  Ascorbic Acid (VITAMIN C PO) Take 1 tablet by mouth daily.    [provider]  aspirin EC 81 MG tablet Take 81 mg by mouth daily.      [provider]  atorvastatin (LIPITOR) 40 MG tablet TAKE 1 TABLET BY MOUTH AT  BEDTIME 12/21/16   [name], MD  escitalopram (LEXAPRO) 10 MG tablet TAKE 1 TABLET BY MOUTH  DAILY 03/01/17   [name], MD  fluticasone (FLONASE) 50 MCG/ACT nasal spray One spray in each nostril twice a day, use left hand for right nostril, and right hand for left nostril. 04/21/16   Silverio Decamp, MD  furosemide (LASIX) 40 MG tablet Take 1 tablet (40 mg total) by mouth daily. 04/26/17   Belva Crome, MD  ipratropium-albuterol (DUONEB) 0.5-2.5 (3) MG/3ML SOLN USE 1 VIAL VIA NEBULIZER EVERY 2 HOURS AS NEEDED FOR WHEEZING, SHORTNESS OF BREATH. 02/12/17   [name], MD  isosorbide mononitrate (IMDUR) 30 MG 24 hr tablet Take 30 mg by mouth daily.    [provider]  levothyroxine (SYNTHROID, LEVOTHROID) 112 MCG tablet TAKE 1 TABLET BY MOUTH  DAILY BEFORE BREAKFAST 03/08/17   [name], MD  Multiple Vitamins-Minerals (ICAPS AREDS 2) CAPS Take 1 capsule 2 (two) times daily by mouth.    [provider]  nitroGLYCERIN (NITROSTAT) 0.4 MG SL tablet DISSOLVE 1 TABLET UNDER THE TONGUE EVERY 5 MINUTES AS  NEEDED CHEST PAIN 04/08/17   [name], MD  oxyCODONE-acetaminophen (PERCOCET/ROXICET) 5-325 MG tablet Take 1 tablet every 6 (six) hours as needed by mouth for severe pain. 04/19/17   Gary Fleet, PA-C  predniSONE (DELTASONE) 20 MG tablet Take 2 tablets (40 mg total) by mouth daily. 05/23/17   Yobana Culliton, Barbette Hair, MD  Tiotropium Bromide-Olodaterol (STIOLTO RESPIMAT) 2.5-2.5 MCG/ACT AERS Inhale 2 puffs into the lungs daily. 03/09/17   [name], MD  traMADol (ULTRAM) 50 MG tablet Take 2 tablets (100 mg total) by mouth 3 (three) times daily as needed. Patient taking differently: Take  100 mg 3 (three) times daily as needed by mouth for moderate pain.  04/01/17   Silverio Decamp, MD  triamcinolone cream (KENALOG) 0.1 % APPLY EXTERNALLY TO THE AFFECTED AREA DAILY FOR UP TO 2 WEEKS AS NEEDED. AVOID FACE Patient taking differently: APPLY EXTERNALLY TO THE AFFECTED AREA DAILY FOR UP TO 2 WEEKS AS NEEDED FOR ITCHING . AVOID FACE 03/22/17   [name], MD  VESICARE 5 MG tablet Take 5 mg by mouth daily. 06/12/16   [provider]  zolpidem (AMBIEN) 5 MG tablet Take 1 tablet (5 mg total) by mouth at bedtime as needed for sleep. Ok to refill 06/01/2016 Patient taking differently: Take 5 mg at bedtime by mouth.  04/28/16   [name], MD    Family History Family History  Problem Relation Age of Onset  . Stroke Mother   . Arthritis Mother   . Asthma Mother   . Emphysema  Mother   . Stroke Father   . Arthritis Sister   . Stroke Sister   . Heart attack Neg Hx     Social History Social History   Tobacco Use  . Smoking status: Former Smoker    Packs/day: 1.00    Years: 10.00    Pack years: 10.00    Types: Cigarettes    Last attempt to quit: 06/01/1982    Years since quitting: 35.0  . Smokeless tobacco: Never Used  . Tobacco comment: "quit smoking ~ 25 years ago; smoked ~ 1 pk/wk"  Substance Use Topics  . Alcohol use: No  . Drug use: No     Allergies   Patient has no known allergies.   Review of Systems Review of Systems  Constitutional: Negative for fever.  Respiratory: Positive for shortness of breath. Negative for cough.   Cardiovascular: Positive for leg swelling. Negative for chest pain.  Gastrointestinal: Negative for abdominal pain and nausea.  All other systems reviewed and are negative.    Physical Exam Updated Vital Signs BP 139/75   Pulse 84   Temp 97.6 F (36.4 C) (Oral)   Resp 20   SpO2 96%   Physical Exam  Constitutional: He is oriented to person, place, and time.  Elderly, overweight, no acute distress,  nontoxic appearing  HENT:  Head: Normocephalic and atraumatic.  Cardiovascular: Normal rate, regular rhythm and normal heart sounds.  No murmur heard. Pulmonary/Chest: [place] normal. No respiratory distress. He has wheezes.  Good air movement, occasional wheeze  Abdominal: Soft. Bowel sounds are normal. There is no tenderness. There is no rebound.  Musculoskeletal:       Right lower leg: He exhibits edema.       Left lower leg: He exhibits edema.  Trace to 1+ bilateral lower extremity edema  Lymphadenopathy:    He has no cervical adenopathy.  Neurological: He is alert and oriented to person, place, and time.  Skin: Skin is warm and dry.  Psychiatric: He has a normal mood and affect.  Nursing note and vitals reviewed.    ED Treatments / Results  Labs (all labs ordered are listed, but only abnormal results are displayed) Labs Reviewed  CBC - Abnormal; Notable for the following components:      Result Value   RBC 4.14 (*)    Hemoglobin 12.5 (*)    All other components within normal limits  BASIC METABOLIC PANEL - Abnormal; Notable for the following components:   Glucose, Bld 140 (*)    Calcium 8.8 (*)    All other components within normal limits  BRAIN NATRIURETIC PEPTIDE    EKG  EKG Interpretation  Date/Time:  Sunday May 23 2017 05:47:53 EST Ventricular Rate:  85 PR Interval:  174 QRS Duration: 98 QT Interval:  362 QTC Calculation: 430 R Axis:   -56 Text Interpretation:  Normal sinus rhythm Left axis deviation Abnormal ECG No significant change since last tracing Confirmed by Thayer Jew [phone]) on 05/23/2017 6:10:26 AM       Radiology Dg Chest 2 View  Result Date: 05/23/2017 CLINICAL DATA:  Subacute onset of shortness of breath. EXAM: CHEST  2 VIEW COMPARISON:  Chest radiograph performed 11/20/2016 FINDINGS: The lungs are well-aerated. Mild vascular congestion is noted. Peribronchial thickening is seen. Mild bibasilar atelectasis is noted. There is no  evidence of pleural effusion or pneumothorax. The heart is normal in size; the mediastinal contour is within normal limits. No acute osseous abnormalities are seen. IMPRESSION: Mild vascular  congestion. Mild bibasilar atelectasis. Peribronchial thickening seen. Electronically Signed   By: Garald Balding M.D.   On: 05/23/2017 06:48    Procedures Procedures (including critical care time)  Medications Ordered in ED Medications  ipratropium-albuterol (DUONEB) 0.5-2.5 (3) MG/3ML nebulizer solution 3 mL (3 mLs Nebulization Given 05/23/17 0652)  predniSONE (DELTASONE) tablet 60 mg (60 mg Oral Given 05/23/17 0650)  furosemide (LASIX) injection 40 mg (40 mg Intravenous Given 05/23/17 0744)     Initial Impression / Assessment and Plan / ED Course  I have reviewed the triage vital signs and the nursing notes.  Pertinent labs & imaging results that were available during my care of the patient were reviewed by me and considered in my medical decision making (see chart for details).     Patient presents with shortness of breath.  Has a history of COPD.  Reports recent negative CT scan.  Reports some improvement this morning with nebulizer treatment.  Minimal wheezing on exam.  Fair air movement.  Minimal lower extremity edema.  Does not appear overtly volume overloaded.  Vital signs are reassuring.  He is satting well on room air.  Patient was given a DuoNeb and prednisone.  Chest x-ray is clear.  Basic lab work is reassuring.  Given lower extremity edema, patient was given 1 dose of IV Lasix for any potential component of volume overload.  He will need to ambulate with pulse ox prior to discharge.    Final Clinical Impressions(s) / ED Diagnoses   Final diagnoses:  SOB (shortness of breath)    ED Discharge Orders        Ordered    predniSONE (DELTASONE) 20 MG tablet  Daily     05/23/17 0751       Merryl Hacker, MD 05/23/17 [phone]

## 2017-05-23 NOTE — Discharge Instructions (Signed)
You were seen today for shortness of breath.  This may be related to your known COPD or volume overload.  You were given 1 dose of Lasix.  You will be placed on 5 days of steroids.  Follow-up with your pulmonologist.

## 2017-05-23 NOTE — ED Notes (Signed)
Pt. To XRAY via stretcher. 

## 2017-05-27 ENCOUNTER — Encounter: Payer: Self-pay | Admitting: Physical Therapy

## 2017-05-27 ENCOUNTER — Other Ambulatory Visit: Payer: Medicare Other

## 2017-06-02 ENCOUNTER — Ambulatory Visit: Payer: Medicare Other | Admitting: Physical Therapy

## 2017-06-02 ENCOUNTER — Encounter: Payer: Self-pay | Admitting: Physical Therapy

## 2017-06-02 DIAGNOSIS — M25662 Stiffness of left knee, not elsewhere classified: Secondary | ICD-10-CM

## 2017-06-02 DIAGNOSIS — M6281 Muscle weakness (generalized): Secondary | ICD-10-CM | POA: Diagnosis not present

## 2017-06-02 DIAGNOSIS — R2689 Other abnormalities of gait and mobility: Secondary | ICD-10-CM

## 2017-06-02 NOTE — Therapy (Addendum)
[facility] [place], [place], [address] Phone: [phone]   Fax:  [phone]  Physical Therapy Treatment  Patient Details  Name: Bryan Hatfield MRN: [id number] Date of Birth: [birth year] Referring Provider: Dr. Dorna Leitz   Encounter Date: 06/02/2017  PT End of Session - 06/02/17 1406    Visit Number  6    Number of Visits  12    Date for PT Re-Evaluation  06/17/17    PT Start Time  1402    PT Stop Time  1432    PT Time Calculation (min)  30 min    Activity Tolerance  Patient tolerated treatment well;No increased pain    Behavior During Therapy  WFL for tasks assessed/performed       Past Medical History:  Diagnosis Date  . Acute respiratory failure ([place]) 09/25/11   hypoxic  . Anxiety   . CAD (coronary artery disease)    a. PTCA 2001. b. f/u cath 05/2012 heavy calcification, no PCI required. c. f/u cath for recurrent sx 2017: stable mod CAD unchanged from prior, mild progression of small vessel disease, elevated LVEDP.  . Colon polyps   . COPD (chronic obstructive pulmonary disease) ([place])   . GERD (gastroesophageal reflux disease)   . High cholesterol   . Hypertension   . Hypothyroidism   . Peripheral vascular disease ([place])    patient denies knowledge of, but listed in his chart  . Pleural effusion    Parapneumonic ?r/t PNA s/p thoracentesis 2008  . Pneumonia    hx  . Sleep apnea    cpap     Past Surgical History:  Procedure Laterality Date  . CARDIAC CATHETERIZATION N/A 11/25/2015   Procedure: Left Heart Cath and Coronary Angiography;  Surgeon: Leonie Man, MD;  Location: [facility];  Service: Cardiovascular;  Laterality: N/A;  . CATARACT EXTRACTION W/ INTRAOCULAR LENS  IMPLANT, BILATERAL  ~ 2006  . CHOLECYSTECTOMY N/A 10/11/2012   Procedure: LAPAROSCOPIC CHOLECYSTECTOMY WITH INTRAOPERATIVE CHOLANGIOGRAM;  Surgeon: Adin Hector, MD;  Location: [facility];  Service: General;   Laterality: N/A;  . COLONOSCOPY    . CORONARY ANGIOPLASTY  01/2000  . HEMORRHOID SURGERY  1960's  . KNEE ARTHROSCOPY Left 04/19/2017   Procedure: ARTHROSCOPY KNEE, SUBCHONDROPLASTY;  Surgeon: Dorna Leitz, MD;  Location: [facility];  Service: Orthopedics;  Laterality: Left;  . KNEE ARTHROSCOPY WITH MEDIAL MENISECTOMY Left 04/19/2017   Procedure: KNEE ARTHROSCOPY WITH MEDIAL AND LATERAL MENISECTOMY;  Surgeon: Dorna Leitz, MD;  Location: [facility];  Service: Orthopedics;  Laterality: Left;    There were no vitals filed for this visit.  Subjective Assessment - 06/02/17 1406    Subjective  Pt reports he had a recent visit to hospital for shortness of breath.  He has not been the [facility] in over a week. He uses cane in community, but no cane at home.  He returns to surgeon tomorrow. He has occasional shooting pains on inside of Lt knee, but resolve quickly.  Overall he feels like he is doing pretty well.    Patient Stated Goals  return to independent ambulation without assistive device and return to [facility] workouts.     Currently in Pain?  No/denies    Pain Score  0-No pain    Pain Location  Knee    Pain Orientation  Left         [facility] PT Assessment - 06/02/17 0001      Assessment  Medical Diagnosis  Lt knee scope     Referring Provider  Dr. Dorna Leitz    Onset Date/Surgical Date  04/19/17    Next MD Visit  06/03/17      Observation/Other Assessments   Focus on Therapeutic Outcomes (FOTO)   36% limited      AROM   Left Knee Extension  -2    Left Knee Flexion  127      Strength   Right/Left Hip  Left    Left Hip Flexion  5/5    Left Hip ABduction  -- 5-/5    Left Knee Flexion  5/5    Left Knee Extension  5/5      [facility] Adult PT Treatment/Exercise - 06/02/17 0001      Knee/Hip Exercises: Stretches   Passive Hamstring Stretch  Right;Left;2 reps;30 seconds seated with leg straight      Knee/Hip Exercises: Aerobic   Nustep  L5: 6.5 min  PTA present to discuss progress      Knee/Hip  Exercises: Standing   Heel Raises  Both;1 set;10 reps    Forward Step Up  Left;Step Height: 6";10 reps;1 set bilat HHA    Step Down  5 reps;Right;1 set;Hand Hold: 2;Step Height: 2"    Functional Squat  1 set;10 reps      Knee/Hip Exercises: Supine   Bridges  Strengthening;1 set;10 reps VC to breath; pt SOB after set      Modalities   Modalities  -- pt declined; will ice at home.                   PT Long Term Goals - 06/02/17 1433      PT LONG TERM GOAL #1   Title  I with HEP to include return to [facility] ( 06/17/17)     Time  6    Period  Weeks    Status  Partially Met      PT LONG TERM GOAL #2   Title  improve Lt knee ROM =/better than -2 extension to 125 flexion to assist with transfers ( 06/17/17)     Time  6    Period  Weeks    Status  Achieved      PT LONG TERM GOAL #3   Title  ambulate without an assistive device on level surfaces in home and in the community ( 06/17/17)     Time  6    Period  Weeks    Status  Partially Met      PT LONG TERM GOAL #4   Title  demo LE strength =/> 5-/5 to allow him to use gym equipment at the [facility] per his previous level ( 06/17/17)     Time  6    Period  Weeks    Status  Partially Met      PT LONG TERM GOAL #5   Title  improve FOTO =/< 52% limited, CK level ( 06/17/17)     Time  6    Period  Weeks    Status  Achieved            Plan - 06/02/17 1448    Clinical Impression Statement  Pt demonstrated improved LE strength.  He tolerates all exercise well, but occasionally needs short seated rest breaks due to shortness of breath.  He is using [place] in community, but uses no assistive device at home.  He has partially met his goals and is nearing meeting remaining goals.  Rehab Potential  Excellent    PT Frequency  2x / week    PT Duration  6 weeks    PT Treatment/Interventions  Gait training;Neuromuscular re-education;Dry needling;Manual techniques;Functional mobility training;Moist Heat;Ultrasound;Therapeutic  activities;Patient/family education;Taping;Vasopneumatic Device;Therapeutic exercise;Cryotherapy;Electrical Stimulation;Balance training;Passive range of motion    PT Next Visit Plan  Will hold therapy per pt request until MD visit.     Consulted and Agree with Plan of Care  Patient       Patient will benefit from skilled therapeutic intervention in order to improve the following deficits and impairments:  Abnormal gait, Pain, Decreased range of motion, Decreased strength, Hypomobility, Decreased balance, Increased edema  Visit Diagnosis: Stiffness of left knee, not elsewhere classified  Muscle weakness (generalized)  Other abnormalities of gait and mobility     Problem List Patient Active Problem List   Diagnosis Date Noted  . Acute medial meniscus tear of left knee 04/19/2017  . Fracture of medial portion of left tibial plateau 04/19/2017  . Primary osteoarthritis of left knee 03/01/2017  . Insomnia 02/19/2015  . GAD (generalized anxiety disorder) 11/21/2014  . Erythrasma 09/18/2014  . Weakness 09/17/2014  . Arterial hypotension 09/17/2014  . Subclinical hypothyroidism 09/07/2014  . Prediabetes 06/08/2013  . Essential hypertension, benign 06/02/2013  . Hyperlipidemia 06/02/2013  . Depression 06/02/2013  . CAD S/P percutaneous coronary angioplasty 05/09/2012  . Morbid obesity ([place]) 09/25/2011  . COPD GOLD 0/I 09/21/2007  . Sleep apnea 09/20/2007   Kerin Perna, PTA 06/02/17 2:53 PM  [facility] [place], [place], [address] Phone: [phone]   Fax:  [phone]  Name: GIOVONI BUNCH MRN: [id number] Date of Birth: 05-Oct-1926   PHYSICAL THERAPY DISCHARGE SUMMARY  Visits from Start of Care: 6  Current functional level related to goals / functional outcomes: See above for function at last visit   Remaining deficits: unknown   Education / Equipment: HEP Plan: Patient agrees to  discharge.  Patient goals were partially met. Patient is being discharged due to being pleased with the current functional level.  ?????    Jeral Pinch, PT 07/06/17 9:20 AM

## 2017-06-03 DIAGNOSIS — Z9889 Other specified postprocedural states: Secondary | ICD-10-CM | POA: Diagnosis not present

## 2017-06-09 ENCOUNTER — Encounter: Payer: Self-pay | Admitting: Family Medicine

## 2017-06-09 ENCOUNTER — Ambulatory Visit (INDEPENDENT_AMBULATORY_CARE_PROVIDER_SITE_OTHER): Payer: Medicare Other | Admitting: Family Medicine

## 2017-06-09 ENCOUNTER — Other Ambulatory Visit: Payer: Self-pay | Admitting: Family Medicine

## 2017-06-09 VITALS — BP 132/56 | HR 72 | Ht 69.0 in | Wt 274.0 lb

## 2017-06-09 DIAGNOSIS — R0602 Shortness of breath: Secondary | ICD-10-CM

## 2017-06-09 DIAGNOSIS — R7303 Prediabetes: Secondary | ICD-10-CM | POA: Diagnosis not present

## 2017-06-09 DIAGNOSIS — I10 Essential (primary) hypertension: Secondary | ICD-10-CM

## 2017-06-09 DIAGNOSIS — E119 Type 2 diabetes mellitus without complications: Secondary | ICD-10-CM | POA: Insufficient documentation

## 2017-06-09 DIAGNOSIS — E038 Other specified hypothyroidism: Secondary | ICD-10-CM | POA: Diagnosis not present

## 2017-06-09 LAB — POCT GLYCOSYLATED HEMOGLOBIN (HGB A1C): HEMOGLOBIN A1C: 7.5

## 2017-06-09 MED ORDER — LEVOTHYROXINE SODIUM 112 MCG PO TABS: ORAL_TABLET | ORAL | 0 refills | Status: DC

## 2017-06-09 MED ORDER — ZOLPIDEM TARTRATE 10 MG PO TABS: 10.0000 mg | ORAL_TABLET | Freq: Every evening | ORAL | 1 refills | Status: DC | PRN

## 2017-06-09 NOTE — Progress Notes (Signed)
Subjective:    CC: BP, glucose.   HPI:  Hypertension- Pt denies chest pain, SOB, dizziness, or heart palpitations.  Taking meds as directed w/o problems.  Denies medication side effects.    Scheduled for colonoscopy on Monday.  Impaired fasting glucose-no increased thirst or urination. No symptoms consistent with hypoglycemia.   He also recently was seen in the emergency department on December 3 for shortness of breath.  They felt like he was a little volume overloaded and gave him Lasix but also gave him a prescription for prednisone with his history of COPD.  He has completed the prednisone.  Chest x-ray showed peribronchial thickening.  Hypothyroidism-patient has been out of his thyroid medication for about a month.  Evidently they were unable to continue current thyroid medication from current manufacturer.  But they have not substituted anything else are shifted to him so he has been off his medication for about 2 months.  Past medical history, Surgical history, Family history not pertinant except as noted below, Social history, Allergies, and medications have been entered into the medical record, reviewed, and corrections made.   Review of Systems: No fevers, chills, night sweats, weight loss, chest pain, or shortness of breath.   Objective:    General: Well Developed, well nourished, and in no acute distress.  Neuro: Alert and oriented x3, extra-ocular muscles intact, sensation grossly intact.  HEENT: Normocephalic, atraumatic, no thyromegaly.   Skin: Warm and dry, no rashes. Cardiac: Regular rate and rhythm, no murmurs rubs or gallops, no lower extremity edema. No carotid bruits.   Respiratory: Clear to auscultation bilaterally. Not using accessory muscles, speaking in full sentences.   Impression and Recommendations:    HTN - Well controlled. Continue current regimen. Follow up in  6 months.   Diabetes - hemoglobin A1c of 7.5.  New diagnosis.  He has been on prednisone and  has not been the best with his diet.  Discussed getting back on track with  diet.  Hypothyroidism-due to recheck TSH we will do that today knowing that his been off his medication for 2 months.  Will restart medication and send a 2-week supply to the local pharmacy and call mail order to substitute.  Plan to follow him back up in 6 weeks and to recheck his thyroid at that time.  Shortness of breath-he is now on Lasix daily which should be helping take care of any fluid I do not see any sign of volume overload today.  He does occasionally get some relief with nebulizer but not consistently.  He had testing done at the [place] which only showed some mild COPD.  Some of this may be related to being off his thyroid medication for the last 2 months as well as weight gain.  Follow-up in 6 weeks.

## 2017-06-10 DIAGNOSIS — I10 Essential (primary) hypertension: Secondary | ICD-10-CM | POA: Diagnosis not present

## 2017-06-10 LAB — COMPLETE METABOLIC PANEL WITH GFR
AG RATIO: 1.6 (calc) (ref 1.0–2.5)
ALT: 24 U/L (ref 9–46)
AST: 19 U/L (ref 10–35)
Albumin: 3.8 g/dL (ref 3.6–5.1)
Alkaline phosphatase (APISO): 112 U/L (ref 40–115)
BUN: 23 mg/dL (ref 7–25)
CO2: 32 mmol/L (ref 20–32)
Calcium: 8.7 mg/dL (ref 8.6–10.3)
Chloride: 100 mmol/L (ref 98–110)
Creat: 0.92 mg/dL (ref 0.70–1.11)
GFR, Est African American: 86 mL/min/{1.73_m2} (ref >=60)
GFR, Est Non African American: 74 mL/min/{1.73_m2} (ref >=60)
GLOBULIN: 2.4 g/dL (ref 1.9–3.7)
Glucose, Bld: 116 mg/dL — ABNORMAL HIGH (ref 65–99)
POTASSIUM: 4.2 mmol/L (ref 3.5–5.3)
SODIUM: 139 mmol/L (ref 135–146)
Total Bilirubin: 0.5 mg/dL (ref 0.2–1.2)
Total Protein: 6.2 g/dL (ref 6.1–8.1)

## 2017-06-10 LAB — TSH: TSH: 10.06 m[IU]/L — AB (ref 0.40–4.50)

## 2017-06-11 ENCOUNTER — Other Ambulatory Visit: Payer: Self-pay | Admitting: Family Medicine

## 2017-06-11 MED ORDER — LEVOTHYROXINE SODIUM 125 MCG PO TABS: 125.0000 ug | ORAL_TABLET | Freq: Every day | ORAL | 1 refills | Status: DC

## 2017-06-14 ENCOUNTER — Other Ambulatory Visit: Payer: Self-pay | Admitting: *Deleted

## 2017-06-14 ENCOUNTER — Encounter: Payer: Self-pay | Admitting: Family Medicine

## 2017-06-14 DIAGNOSIS — E038 Other specified hypothyroidism: Secondary | ICD-10-CM

## 2017-06-14 DIAGNOSIS — D124 Benign neoplasm of descending colon: Secondary | ICD-10-CM | POA: Diagnosis not present

## 2017-06-14 DIAGNOSIS — K5730 Diverticulosis of large intestine without perforation or abscess without bleeding: Secondary | ICD-10-CM | POA: Diagnosis not present

## 2017-06-14 DIAGNOSIS — R933 Abnormal findings on diagnostic imaging of other parts of digestive tract: Secondary | ICD-10-CM | POA: Diagnosis not present

## 2017-06-14 DIAGNOSIS — K621 Rectal polyp: Secondary | ICD-10-CM | POA: Diagnosis not present

## 2017-06-15 DIAGNOSIS — M25562 Pain in left knee: Secondary | ICD-10-CM | POA: Diagnosis not present

## 2017-06-15 DIAGNOSIS — Z9889 Other specified postprocedural states: Secondary | ICD-10-CM | POA: Diagnosis not present

## 2017-06-27 NOTE — Progress Notes (Signed)
Cardiology Office Note    Date:  06/28/2017   ID:  Bryan Hatfield, DOB 06-13-1926, MRN [id number]  PCP:  [name], MD  Cardiologist: Sinclair Grooms, MD   Chief Complaint  Patient presents with  . Coronary Artery Disease    History of Present Illness:  Bryan Hatfield is a 84 y.o. male with hx of CAD, PTCA 2001, heavy coronary calcification at cath, chronic diastolic heart failure,  hypertension, HLD, COPD, OSA on CPAP, parapneumonic pleural effusion r/t PNA s/p thoracentesis 2008, h/o asbestos exposure, remote tobacco abuse (only 5 year history of smoking)   Is doing well.  He is status post left knee replacement in November 2018.  Has been in rehabilitation program without chest discomfort.  He did have a recent emergency room visit because of dyspnea.  He was treated for COPD with improvement.  He now has the ability to use nebulizer therapy at home when dyspnea occurs.  He denies chest pain.  He has not needed sublingual nitroglycerin.   Past Medical History:  Diagnosis Date  . Acute respiratory failure ([place]) 09/25/11   hypoxic  . Anxiety   . CAD (coronary artery disease)    a. PTCA 2001. b. f/u cath 05/2012 heavy calcification, no PCI required. c. f/u cath for recurrent sx 2017: stable mod CAD unchanged from prior, mild progression of small vessel disease, elevated LVEDP.  . Colon polyps   . COPD (chronic obstructive pulmonary disease) ([place])   . GERD (gastroesophageal reflux disease)   . High cholesterol   . Hypertension   . Hypothyroidism   . Peripheral vascular disease ([place])    patient denies knowledge of, but listed in his chart  . Pleural effusion    Parapneumonic ?r/t PNA s/p thoracentesis 2008  . Pneumonia    hx  . Sleep apnea    cpap     Past Surgical History:  Procedure Laterality Date  . CARDIAC CATHETERIZATION N/A 11/25/2015   Procedure: Left Heart Cath and Coronary Angiography;  Surgeon: Leonie Man, MD;  Location: [facility];   Service: Cardiovascular;  Laterality: N/A;  . CATARACT EXTRACTION W/ INTRAOCULAR LENS  IMPLANT, BILATERAL  ~ 2006  . CHOLECYSTECTOMY N/A 10/11/2012   Procedure: LAPAROSCOPIC CHOLECYSTECTOMY WITH INTRAOPERATIVE CHOLANGIOGRAM;  Surgeon: Adin Hector, MD;  Location: [facility];  Service: General;  Laterality: N/A;  . COLONOSCOPY    . CORONARY ANGIOPLASTY  01/2000  . HEMORRHOID SURGERY  1960's  . KNEE ARTHROSCOPY Left 04/19/2017   Procedure: ARTHROSCOPY KNEE, SUBCHONDROPLASTY;  Surgeon: Dorna Leitz, MD;  Location: [facility];  Service: Orthopedics;  Laterality: Left;  . KNEE ARTHROSCOPY WITH MEDIAL MENISECTOMY Left 04/19/2017   Procedure: KNEE ARTHROSCOPY WITH MEDIAL AND LATERAL MENISECTOMY;  Surgeon: Dorna Leitz, MD;  Location: [facility];  Service: Orthopedics;  Laterality: Left;    Current Medications: Outpatient Medications Prior to Visit  Medication Sig Dispense Refill  . albuterol (PROVENTIL HFA;VENTOLIN HFA) 108 (90 Base) MCG/ACT inhaler Inhale 2 puffs every 6 (six) hours as needed into the lungs for wheezing or shortness of breath.    . AMBULATORY NON FORMULARY MEDICATION Medication Name: 4 prong cane. Dx: gait instability 1 vial 0  . Ascorbic Acid (VITAMIN C PO) Take 1 tablet by mouth daily.    Marland Kitchen aspirin EC 81 MG tablet Take 81 mg by mouth daily.      Marland Kitchen atorvastatin (LIPITOR) 40 MG tablet TAKE 1 TABLET BY MOUTH AT  BEDTIME 90 tablet 3  .  escitalopram (LEXAPRO) 10 MG tablet TAKE 1 TABLET BY MOUTH  DAILY 90 tablet 1  . fluticasone (FLONASE) 50 MCG/ACT nasal spray One spray in each nostril twice a day, use left hand for right nostril, and right hand for left nostril. 48 g 3  . furosemide (LASIX) 40 MG tablet Take 1 tablet (40 mg total) by mouth daily. 90 tablet 3  . ipratropium-albuterol (DUONEB) 0.5-2.5 (3) MG/3ML SOLN USE 1 VIAL VIA NEBULIZER EVERY 2 HOURS AS NEEDED FOR WHEEZING, SHORTNESS OF BREATH. 4050 mL 3  . isosorbide mononitrate (IMDUR) 30 MG 24 hr tablet Take 30 mg by mouth daily.    Marland Kitchen  levothyroxine (SYNTHROID, LEVOTHROID) 125 MCG tablet Take 1 tablet (125 mcg total) by mouth daily before breakfast. TAKE 1 TABLET BY MOUTH  DAILY BEFORE BREAKFAST 30 tablet 1  . Multiple Vitamins-Minerals (ICAPS AREDS 2) CAPS Take 1 capsule 2 (two) times daily by mouth.    . nitroGLYCERIN (NITROSTAT) 0.4 MG SL tablet DISSOLVE 1 TABLET UNDER THE TONGUE EVERY 5 MINUTES AS  NEEDED CHEST PAIN 25 tablet 0  . oxyCODONE-acetaminophen (PERCOCET/ROXICET) 5-325 MG tablet Take 1 tablet every 6 (six) hours as needed by mouth for severe pain. 40 tablet 0  . Tiotropium Bromide-Olodaterol (STIOLTO RESPIMAT) 2.5-2.5 MCG/ACT AERS Inhale 2 puffs into the lungs daily. 4 g 5  . traMADol (ULTRAM) 50 MG tablet Take 2 tablets (100 mg total) by mouth 3 (three) times daily as needed. (Patient taking differently: Take 100 mg 3 (three) times daily as needed by mouth for moderate pain. ) 90 tablet 3  . triamcinolone cream (KENALOG) 0.1 % APPLY EXTERNALLY TO THE AFFECTED AREA DAILY FOR UP TO 2 WEEKS AS NEEDED. AVOID FACE (Patient taking differently: APPLY EXTERNALLY TO THE AFFECTED AREA DAILY FOR UP TO 2 WEEKS AS NEEDED FOR ITCHING . AVOID FACE) 80 g prn  . VESICARE 5 MG tablet Take 5 mg by mouth daily.    Marland Kitchen zolpidem (AMBIEN) 10 MG tablet Take 1 tablet (10 mg total) by mouth at bedtime as needed for sleep. Ok to refill 06/01/2016 30 tablet 1   No facility-administered medications prior to visit.      Allergies:   Patient has no known allergies.   Social History   Socioeconomic History  . Marital status: Married    Spouse name: None  . Number of children: None  . Years of education: None  . Highest education level: None  Social Needs  . Financial resource strain: None  . Food insecurity - worry: None  . Food insecurity - inability: None  . Transportation needs - medical: None  . Transportation needs - non-medical: None  Occupational History  . Occupation: Retired    Comment: Surveyor, [employer] - [employer]  Tobacco Use   . Smoking status: Former Smoker    Packs/day: 1.00    Years: 10.00    Pack years: 10.00    Types: Cigarettes    Last attempt to quit: 06/01/1982    Years since quitting: 35.0  . Smokeless tobacco: Never Used  . Tobacco comment: "quit smoking ~ 25 years ago; smoked ~ 1 pk/wk"  Substance and Sexual Activity  . Alcohol use: No  . Drug use: No  . Sexual activity: No  Other Topics Concern  . None  Social History Narrative  . None     Family History:  The patient's family history includes Arthritis in his mother and sister; Asthma in his mother; Emphysema in his mother; Stroke in his  father, mother, and sister.   ROS:   Please see the history of present illness.    Unexplained weight gain.  Recent left knee surgery April 19, 2017, coming along well.  Recent colonoscopy by Dr. Laurence Spates.  Will have to be repeated because he was not adequately prepped. All other systems reviewed and are negative.   PHYSICAL EXAM:   VS:  BP 132/74   Pulse 80   Ht 5\' 11"  (1.803 m)   Wt 264 lb 12.8 oz (120.1 kg)   BMI 36.93 kg/m    GEN: Well nourished, well developed, in no acute distress: Morbidly obese. HEENT: normal  Neck: no JVD, carotid bruits, or masses Cardiac: RRR; no murmurs, rubs, or gallops,no edema  Respiratory:  clear to auscultation bilaterally, normal work of breathing GI: soft, nontender, nondistended, + BS MS: no deformity or atrophy  Skin: warm and dry, no rash Neuro:  Alert and Oriented x 3, Strength and sensation are intact Psych: euthymic mood, full affect  Wt Readings from Last 3 Encounters:  06/28/17 264 lb 12.8 oz (120.1 kg)  06/09/17 274 lb (124.3 kg)  04/19/17 275 lb (124.7 kg)      Studies/Labs Reviewed:   EKG:  EKG May 23, 2017 demonstrates left anterior hemiblock, sinus rhythm, poor R wave progression.  No change from prior tracings dating back to February 2018.  Recent Labs: 05/23/2017: B Natriuretic Peptide 117.0; Hemoglobin 12.5; Platelets  166 06/10/2017: ALT 24; BUN 23; Creat 0.92; Potassium 4.2; Sodium 139; TSH 10.06   Lipid Panel    Component Value Date/Time   CHOL 161 12/07/2016 0743   TRIG 80 12/07/2016 0743   HDL 73 12/07/2016 0743   CHOLHDL 2.2 12/07/2016 0743   VLDL 17 09/05/2014 0837   LDLCALC 69 09/05/2014 0837    Additional studies/ records that were reviewed today include:  No new data    ASSESSMENT:    1. Coronary artery disease involving native coronary artery of native heart with angina pectoris ([place])   2. Idiopathic hypotension   3. Essential hypertension, benign   4. COPD GOLD 0/I   5. Sleep apnea, unspecified type   6. Mixed hyperlipidemia      PLAN:  In order of problems listed above:  1. Stable without gentleman anginal complaints. 2. No recent syncopal episodes. 3. Excellent blood pressure control. 4. Has bronchodilator therapy and capacity for nebulizer treatment at home now. 5. If not addressed 6. LDL target less than 70.  Followed by primary care.    Medication Adjustments/Labs and Tests Ordered: Current medicines are reviewed at length with the patient today.  Concerns regarding medicines are outlined above.  Medication changes, Labs and Tests ordered today are listed in the Patient Instructions below. Patient Instructions  Medication Instructions:  Your physician recommends that you continue on your current medications as directed. Please refer to the Current Medication list given to you today.  Labwork: None  Testing/Procedures: None  Follow-Up: Your physician wants you to follow-up in: 1 year with Dr. [name].  You will receive a reminder letter in the mail two months in advance. If you don't receive a letter, please call our office to schedule the follow-up appointment.   Any Other Special Instructions Will Be Listed Below (If Applicable).     If you need a refill on your cardiac medications before your next appointment, please call your pharmacy.       Signed, Sinclair Grooms, MD  06/28/2017 9:45 AM  [facility] [place], [place], [place]  [address] Phone: [phone]; Fax: [phone]

## 2017-06-28 ENCOUNTER — Ambulatory Visit: Payer: Medicare Other | Admitting: Interventional Cardiology

## 2017-06-28 ENCOUNTER — Encounter: Payer: Self-pay | Admitting: Interventional Cardiology

## 2017-06-28 VITALS — BP 132/74 | HR 80 | Ht 71.0 in | Wt 264.8 lb

## 2017-06-28 DIAGNOSIS — E782 Mixed hyperlipidemia: Secondary | ICD-10-CM

## 2017-06-28 DIAGNOSIS — I950 Idiopathic hypotension: Secondary | ICD-10-CM

## 2017-06-28 DIAGNOSIS — I25119 Atherosclerotic heart disease of native coronary artery with unspecified angina pectoris: Secondary | ICD-10-CM

## 2017-06-28 DIAGNOSIS — G4730 Sleep apnea, unspecified: Secondary | ICD-10-CM

## 2017-06-28 DIAGNOSIS — J449 Chronic obstructive pulmonary disease, unspecified: Secondary | ICD-10-CM

## 2017-06-28 DIAGNOSIS — I10 Essential (primary) hypertension: Secondary | ICD-10-CM | POA: Diagnosis not present

## 2017-06-28 NOTE — Patient Instructions (Signed)

## 2017-07-06 DIAGNOSIS — M25562 Pain in left knee: Secondary | ICD-10-CM | POA: Diagnosis not present

## 2017-07-08 DIAGNOSIS — Z86010 Personal history of colonic polyps: Secondary | ICD-10-CM | POA: Diagnosis not present

## 2017-07-08 DIAGNOSIS — D126 Benign neoplasm of colon, unspecified: Secondary | ICD-10-CM | POA: Diagnosis not present

## 2017-07-08 DIAGNOSIS — D490 Neoplasm of unspecified behavior of digestive system: Secondary | ICD-10-CM | POA: Diagnosis not present

## 2017-07-08 LAB — HM COLONOSCOPY

## 2017-07-13 DIAGNOSIS — D126 Benign neoplasm of colon, unspecified: Secondary | ICD-10-CM | POA: Diagnosis not present

## 2017-07-22 ENCOUNTER — Ambulatory Visit: Payer: Medicare Other | Admitting: Family Medicine

## 2017-07-22 ENCOUNTER — Telehealth: Payer: Self-pay | Admitting: Interventional Cardiology

## 2017-07-22 DIAGNOSIS — D123 Benign neoplasm of transverse colon: Secondary | ICD-10-CM | POA: Diagnosis not present

## 2017-07-22 NOTE — Telephone Encounter (Signed)
New Message:    Dr [name] please call Dr Laurence Spates on his [phone].This is concerning patient,Bryan Hatfield.

## 2017-07-22 NOTE — Telephone Encounter (Signed)
Dr. [name] called and spoke with Dr. [name]

## 2017-07-23 ENCOUNTER — Other Ambulatory Visit: Payer: Self-pay | Admitting: General Surgery

## 2017-07-23 DIAGNOSIS — K6389 Other specified diseases of intestine: Secondary | ICD-10-CM

## 2017-07-26 ENCOUNTER — Encounter: Payer: Self-pay | Admitting: Family Medicine

## 2017-07-26 ENCOUNTER — Telehealth: Payer: Self-pay | Admitting: Family Medicine

## 2017-07-26 ENCOUNTER — Other Ambulatory Visit: Payer: Self-pay | Admitting: Family Medicine

## 2017-07-26 ENCOUNTER — Ambulatory Visit (INDEPENDENT_AMBULATORY_CARE_PROVIDER_SITE_OTHER): Payer: Medicare Other | Admitting: Family Medicine

## 2017-07-26 VITALS — BP 117/56 | HR 69 | Ht 71.0 in | Wt 261.0 lb

## 2017-07-26 DIAGNOSIS — K639 Disease of intestine, unspecified: Secondary | ICD-10-CM

## 2017-07-26 DIAGNOSIS — K6389 Other specified diseases of intestine: Secondary | ICD-10-CM

## 2017-07-26 DIAGNOSIS — E119 Type 2 diabetes mellitus without complications: Secondary | ICD-10-CM | POA: Diagnosis not present

## 2017-07-26 DIAGNOSIS — J449 Chronic obstructive pulmonary disease, unspecified: Secondary | ICD-10-CM

## 2017-07-26 DIAGNOSIS — E038 Other specified hypothyroidism: Secondary | ICD-10-CM | POA: Diagnosis not present

## 2017-07-26 LAB — TSH: TSH: 5.07 m[IU]/L — AB (ref 0.40–4.50)

## 2017-07-26 LAB — BASIC METABOLIC PANEL WITH GFR
BUN: 21 mg/dL (ref 7–25)
CALCIUM: 9.1 mg/dL (ref 8.6–10.3)
CHLORIDE: 102 mmol/L (ref 98–110)
CO2: 30 mmol/L (ref 20–32)
Creat: 0.96 mg/dL (ref 0.70–1.11)
GFR, Est African American: 81 mL/min/{1.73_m2} (ref >=60)
GFR, Est Non African American: 70 mL/min/{1.73_m2} (ref >=60)
GLUCOSE: 107 mg/dL — AB (ref 65–99)
Potassium: 4.2 mmol/L (ref 3.5–5.3)
Sodium: 140 mmol/L (ref 135–146)

## 2017-07-26 MED ORDER — METFORMIN HCL ER 500 MG PO TB24: 500.0000 mg | ORAL_TABLET | Freq: Every day | ORAL | 1 refills | Status: DC

## 2017-07-26 NOTE — Telephone Encounter (Signed)
Please call [facility] and see if they can fax last colonoscopy and path report.  We received the first where he didn't have a good prep but haven't received last scope report.

## 2017-07-26 NOTE — Progress Notes (Addendum)
Subjective:    CC: DM, thyroid.    HPI:  Diabetes - no hypoglycemic events. No wounds or sores that are not healing well. No increased thirst or urination. Checking glucose at home. Taking medications as prescribed without any side effects. He has had a dec appetite and has been drinking slim fast.    Hypothyroidism -last TSH was 10.06 in January.  It actually been off of his medication for a couple of months at that point in time.  He is also reports that he is been feeling short of breath for about 8 weeks.  When I last saw him he had started taking Lasix daily to help with volume overload.  He had some testing at the [place] which showed some mild COPD as well.  Also felt like it could have been related to him being off his thyroid medication for a couple months. His SOB is much better.  He feels better since using his nebulizer. No chest pain.    Past medical history, Surgical history, Family history not pertinant except as noted below, Social history, Allergies, and medications have been entered into the medical record, reviewed, and corrections made.   Review of Systems: No fevers, chills, night sweats, weight loss, chest pain, or shortness of breath.   Objective:    General: Well Developed, well nourished, and in no acute distress.  Neuro: Alert and oriented x3, extra-ocular muscles intact, sensation grossly intact.  HEENT: Normocephalic, atraumatic  Skin: Warm and dry, no rashes. Cardiac: Regular rate and rhythm, no murmurs rubs or gallops, no lower extremity edema.  Respiratory: Clear to auscultation bilaterally. Not using accessory muscles, speaking in full sentences.   Impression and Recommendations:    DM -  A1C up from previous thought not full 90 days. Will add metformin. He has lost some weight.    Hypothyroidism-due to recheck TSH today.  Will adjust dose if needed.  He will need refills sent to the pharmacy.  SOB much improved.  A combination of using his nebulizer,  removing excess fluid by taking his Lasix daily, and losing weight.  He is down 3 lbs, taking lasix daily.  Due to recheck BMP.    He also wanted to let me know when he has a colon mass.  Initially he had a diagnostic image to take a look at the colon.  Unfortunately the machine was broken so they were not able to do the test.  He went back for second time and they were able to get an evaluation done but showed some abnormality to the recommended colonoscopy even though he is 84 years old.  Unfortunately on the first colonoscopy he did not have a good cleanout so had to go back for second but on the second they did find a mass.  He actually has a consultation with the surgeon coming up to discuss surgical removal.  He says he will likely go forward with surgery if it is recommended.

## 2017-07-27 NOTE — Telephone Encounter (Signed)
Record requested.

## 2017-08-03 ENCOUNTER — Ambulatory Visit
Admission: RE | Admit: 2017-08-03 | Discharge: 2017-08-03 | Disposition: A | Discharge status: Home / Self Care | Payer: Medicare Other | Source: Ambulatory Visit | Attending: General Surgery | Admitting: General Surgery

## 2017-08-03 DIAGNOSIS — K5730 Diverticulosis of large intestine without perforation or abscess without bleeding: Secondary | ICD-10-CM | POA: Diagnosis not present

## 2017-08-03 DIAGNOSIS — K6389 Other specified diseases of intestine: Secondary | ICD-10-CM

## 2017-08-03 DIAGNOSIS — K449 Diaphragmatic hernia without obstruction or gangrene: Secondary | ICD-10-CM | POA: Diagnosis not present

## 2017-08-03 MED ORDER — IOPAMIDOL (ISOVUE-300) INJECTION 61%
125.0000 mL | Freq: Once | INTRAVENOUS | Status: AC | PRN
  Administered 2017-08-03: 125 mL via INTRAVENOUS

## 2017-08-04 ENCOUNTER — Encounter: Payer: Self-pay | Admitting: Family Medicine

## 2017-08-04 DIAGNOSIS — D126 Benign neoplasm of colon, unspecified: Secondary | ICD-10-CM | POA: Insufficient documentation

## 2017-08-05 ENCOUNTER — Encounter: Payer: Self-pay | Admitting: General Surgery

## 2017-08-05 ENCOUNTER — Other Ambulatory Visit: Payer: Self-pay | Admitting: General Surgery

## 2017-08-05 DIAGNOSIS — E119 Type 2 diabetes mellitus without complications: Secondary | ICD-10-CM | POA: Diagnosis not present

## 2017-08-05 DIAGNOSIS — K639 Disease of intestine, unspecified: Secondary | ICD-10-CM | POA: Diagnosis not present

## 2017-08-05 DIAGNOSIS — E039 Hypothyroidism, unspecified: Secondary | ICD-10-CM | POA: Diagnosis not present

## 2017-08-05 DIAGNOSIS — I2510 Atherosclerotic heart disease of native coronary artery without angina pectoris: Secondary | ICD-10-CM | POA: Diagnosis not present

## 2017-08-06 ENCOUNTER — Telehealth: Payer: Self-pay | Admitting: *Deleted

## 2017-08-06 NOTE — Telephone Encounter (Signed)
   [facility] Pre-operative Risk Assessment    Request for surgical clearance:  1. What type of surgery is being performed? Laparoscopic-assisted right colectomy, possible open colectomy  2. When is this surgery scheduled? Pending Clearance   3. What type of clearance is required (medical clearance vs. Pharmacy clearance to hold med vs. Both)? Both  4. Are there any medications that need to be held prior to surgery and how long? Aspirin before and after the procedure  5. Practice name and name of physician performing surgery? [facility], [place] Dalbert Batman, MD  6. What is your office phone and fax number? P#[phone], F#[phone]   Anesthesia type (None, local, MAC, general) ? Pending Clearance  Bryan Hatfield 08/06/2017, 3:11 PM  _________________________________________________________________   (provider comments below)

## 2017-08-06 NOTE — Telephone Encounter (Signed)
Referral sent to scheduling. 

## 2017-08-09 NOTE — Telephone Encounter (Signed)
   Primary Cardiologist:Henry Nicholes Stairs III, MD  Chart reviewed as part of pre-operative protocol coverage. Because of Bryan Hatfield past medical history and time since last visit, he/she will require a phone call to ensure no new cardiac symptoms since his recent office visit with Dr. [name] 06/2017.  I attempted to contact pt by phone. Left message to call back pre-op clinic M-F between the hrs of 1:30- 5 pm   Lyda Jester, PA-C  08/09/2017, 4:56 PM

## 2017-08-10 ENCOUNTER — Encounter: Payer: Self-pay | Admitting: Family Medicine

## 2017-08-10 NOTE — Progress Notes (Signed)
07/08/2017 

## 2017-08-11 ENCOUNTER — Encounter: Payer: Self-pay | Admitting: Family Medicine

## 2017-08-11 ENCOUNTER — Ambulatory Visit (INDEPENDENT_AMBULATORY_CARE_PROVIDER_SITE_OTHER): Payer: Medicare Other | Admitting: Family Medicine

## 2017-08-11 VITALS — BP 130/51 | HR 78 | Temp 97.3°F | Ht 71.0 in | Wt 262.0 lb

## 2017-08-11 DIAGNOSIS — J441 Chronic obstructive pulmonary disease with (acute) exacerbation: Secondary | ICD-10-CM

## 2017-08-11 DIAGNOSIS — J0110 Acute frontal sinusitis, unspecified: Secondary | ICD-10-CM | POA: Diagnosis not present

## 2017-08-11 MED ORDER — AMOXICILLIN-POT CLAVULANATE 875-125 MG PO TABS: 1.0000 | ORAL_TABLET | Freq: Two times a day (BID) | ORAL | 0 refills | Status: DC

## 2017-08-11 MED ORDER — ZOLPIDEM TARTRATE 10 MG PO TABS: 10.0000 mg | ORAL_TABLET | Freq: Every evening | ORAL | 1 refills | Status: DC | PRN

## 2017-08-11 MED ORDER — PREDNISONE 20 MG PO TABS: 40.0000 mg | ORAL_TABLET | Freq: Every day | ORAL | 0 refills | Status: DC

## 2017-08-11 NOTE — Telephone Encounter (Signed)
Patient scheduled to see Melina Copa [facility] on Monday August 16, 2017 at 11:30 am. Patient aware of appt date and time.

## 2017-08-11 NOTE — Telephone Encounter (Signed)
Patient called back. He is having SOB and LE edema. Will make follow up.

## 2017-08-11 NOTE — Progress Notes (Signed)
   Subjective:    Patient ID: Bryan Hatfield, male    DOB: 09-28-1926, 84 y.o.   MRN: [id number]  HPI 84 year old male with a history of COPD comes in today complaining of persistent cough and nasal congestion x  1 week.   He has been getting light yellow mucous form his nose and with coughing. No fever, chills or sweats.  + frontal HA.  Has has felt SOB and has been wheezing.  He has been taking Benadryl and says that does help temporarily.  He thought maybe it was allergies.    Review of Systems     Objective:   Physical Exam  Constitutional: He is oriented to person, place, and time. He appears well-developed and well-nourished.  HENT:  Head: Normocephalic and atraumatic.  Right Ear: External ear normal.  Left Ear: External ear normal.  Nose: Nose normal.  Mouth/Throat: Oropharynx is clear and moist.  TMs and canals are clear.   Eyes: Conjunctivae and EOM are normal. Pupils are equal, round, and reactive to light.  Neck: Neck supple. No thyromegaly present.  Cardiovascular: Normal rate and normal heart sounds.  Pulmonary/Chest: Effort normal and breath sounds normal.  Lymphadenopathy:    He has no cervical adenopathy.  Neurological: He is alert and oriented to person, place, and time.  Skin: Skin is warm and dry.  Psychiatric: He has a normal mood and affect.          Assessment & Plan:  Acute sinusitis with COPD exacerbation-we will treat with Augmentin and prednisone.  Hopefully he will be feeling much better in time for his surgery which is scheduled in 2 weeks to remove a mass on his colon.  Hopefully will be benign.  Call if not significantly better by Monday.  Okay to continue Benadryl since it does seem to provide some symptom relief.

## 2017-08-11 NOTE — Telephone Encounter (Signed)
Left voice mail to call between pre-op hours.  

## 2017-08-13 ENCOUNTER — Encounter: Payer: Self-pay | Admitting: Sports Medicine

## 2017-08-13 ENCOUNTER — Ambulatory Visit: Payer: Medicare Other | Admitting: Cardiology

## 2017-08-13 ENCOUNTER — Ambulatory Visit (INDEPENDENT_AMBULATORY_CARE_PROVIDER_SITE_OTHER): Payer: Medicare Other | Admitting: Sports Medicine

## 2017-08-13 DIAGNOSIS — M1712 Unilateral primary osteoarthritis, left knee: Secondary | ICD-10-CM | POA: Diagnosis not present

## 2017-08-13 NOTE — Progress Notes (Signed)
Subjective:    CC: Follow-up  HPI: Left knee pain: This is a pleasant 84 year old male with known left knee osteoarthritis, he had complex medial and lateral meniscal tears as well as a subchondral insufficiency fracture on MRI, he saw Dr. Berenice Primas, had a medial and lateral partial meniscectomy with debridement, as well as bone cement injection for at the subchondral insufficiency fracture, still has some pain, this is expected as he does have pre-existing osteoarthritis.  I reviewed the past medical history, family history, social history, surgical history, and allergies today and no changes were needed.  Please see the problem list section below in epic for further details.  Past Medical History: Past Medical History:  Diagnosis Date  . Acute respiratory failure ([place]) 09/25/11   hypoxic  . Anxiety   . CAD (coronary artery disease)    a. PTCA 2001. b. f/u cath 05/2012 heavy calcification, no PCI required. c. f/u cath for recurrent sx 2017: stable mod CAD unchanged from prior, mild progression of small vessel disease, elevated LVEDP.  . Colon polyps   . COPD (chronic obstructive pulmonary disease) ([place])   . GERD (gastroesophageal reflux disease)   . High cholesterol   . Hypertension   . Hypothyroidism   . Peripheral vascular disease ([place])    patient denies knowledge of, but listed in his chart  . Pleural effusion    Parapneumonic ?r/t PNA s/p thoracentesis 2008  . Pneumonia    hx  . Sleep apnea    cpap    Past Surgical History: Past Surgical History:  Procedure Laterality Date  . CARDIAC CATHETERIZATION N/A 11/25/2015   Procedure: Left Heart Cath and Coronary Angiography;  Surgeon: Leonie Man, MD;  Location: [place] [facility];  Service: Cardiovascular;  Laterality: N/A;  . CATARACT EXTRACTION W/ INTRAOCULAR LENS  IMPLANT, BILATERAL  ~ 2006  . CHOLECYSTECTOMY N/A 10/11/2012   Procedure: LAPAROSCOPIC CHOLECYSTECTOMY WITH INTRAOPERATIVE CHOLANGIOGRAM;  Surgeon: Adin Hector, MD;  Location: [place];  Service: General;  Laterality: N/A;  . COLONOSCOPY    . CORONARY ANGIOPLASTY  01/2000  . HEMORRHOID SURGERY  1960's  . KNEE ARTHROSCOPY Left 04/19/2017   Procedure: ARTHROSCOPY KNEE, SUBCHONDROPLASTY;  Surgeon: Dorna Leitz, MD;  Location: [place];  Service: Orthopedics;  Laterality: Left;  . KNEE ARTHROSCOPY WITH MEDIAL MENISECTOMY Left 04/19/2017   Procedure: KNEE ARTHROSCOPY WITH MEDIAL AND LATERAL MENISECTOMY;  Surgeon: Dorna Leitz, MD;  Location: [place];  Service: Orthopedics;  Laterality: Left;   Social History: Social History   Socioeconomic History  . Marital status: Married    Spouse name: None  . Number of children: None  . Years of education: None  . Highest education level: None  Social Needs  . Financial resource strain: None  . Food insecurity - worry: None  . Food insecurity - inability: None  . Transportation needs - medical: None  . Transportation needs - non-medical: None  Occupational History  . Occupation: Retired    Comment: Surveyor, quantity - [employer]  Tobacco Use  . Smoking status: Former Smoker    Packs/day: 1.00    Years: 10.00    Pack years: 10.00    Types: Cigarettes    Last attempt to quit: 06/01/1982    Years since quitting: 35.2  . Smokeless tobacco: Never Used  . Tobacco comment: "quit smoking ~ 25 years ago; smoked ~ 1 pk/wk"  Substance and Sexual Activity  . Alcohol use: No  . Drug use: No  . Sexual activity:  No  Other Topics Concern  . None  Social History Narrative  . None   Family History: Family History  Problem Relation Age of Onset  . Stroke Mother   . Arthritis Mother   . Asthma Mother   . Emphysema Mother   . Stroke Father   . Arthritis Sister   . Stroke Sister   . Heart attack Neg Hx    Allergies: No Known Allergies Medications: See med rec.  Review of Systems: No fevers, chills, night sweats, weight loss, chest pain, or shortness of breath.   Objective:    General: Well Developed,  well nourished, and in no acute distress.  Neuro: Alert and oriented x3, extra-ocular muscles intact, sensation grossly intact.  HEENT: Normocephalic, atraumatic, pupils equal round reactive to light, neck supple, no masses, no lymphadenopathy, thyroid nonpalpable.  Skin: Warm and dry, no rashes. Cardiac: Regular rate and rhythm, no murmurs rubs or gallops, no lower extremity edema.  Respiratory: Clear to auscultation bilaterally. Not using accessory muscles, speaking in full sentences. Left knee: Normal to inspection with no erythema or effusion or obvious bony abnormalities. Palpation normal with no warmth or joint line tenderness or patellar tenderness or condyle tenderness. ROM normal in flexion and extension and lower leg rotation. Ligaments with solid consistent endpoints including ACL, PCL, LCL, MCL. Negative Mcmurray's and provocative meniscal tests. Non painful patellar compression. Patellar and quadriceps tendons unremarkable. Hamstring and quadriceps strength is normal.  Procedure: Real-time Ultrasound Guided Injection of left knee Device: GE Logiq E  Verbal informed consent obtained.  Time-out conducted.  Noted no overlying erythema, induration, or other signs of local infection.  Skin prepped in a sterile fashion.  Local anesthesia: Topical Ethyl chloride.  With sterile technique and under real time ultrasound guidance: 1 cc: 40, 2 cc lidocaine, 2 cc bupivacaine injected easily Completed without difficulty  Pain immediately resolved suggesting accurate placement of the medication.  Advised to call if fevers/chills, erythema, induration, drainage, or persistent bleeding.  Images permanently stored and available for review in the ultrasound unit.  Impression: Technically successful ultrasound guided injection.  Impression and Recommendations:    Primary osteoarthritis of left knee Post partial meniscectomy, subchondral insufficiency fixation with bone cement. Still with  pain, likely from the pre-existing osteoarthritis. Injection as above, we are going to get him approved for Visco supplementation. Return to start Orthovisc, we will do 2 shots per week to get him done before his colectomy in early April.  __________________________________________ Gwen Her. Dianah Field, M.D., ABFM., CAQSM. Primary Care and [place] Instructor of [facility] [employer]

## 2017-08-13 NOTE — Assessment & Plan Note (Signed)
Post partial meniscectomy, subchondral insufficiency fixation with bone cement. Still with pain, likely from the pre-existing osteoarthritis. Injection as above, we are going to get him approved for Visco supplementation. Return to start Orthovisc, we will do 2 shots per week to get him done before his colectomy in early April.

## 2017-08-15 ENCOUNTER — Encounter: Payer: Self-pay | Admitting: Physician Assistant

## 2017-08-15 NOTE — Progress Notes (Signed)
Cardiology Office Note    Date:  08/16/2017  ID:  Bryan Hatfield, DOB 1927/02/04, MRN [id number] PCP:  Bryan Marry, MD  Cardiologist:  Dr. Tamala Hatfield   Chief Complaint: pre-operative evaluation  History of Present Illness:  Bryan Hatfield is a 84 y.o. male with history of CAD (s/p PTCA 2001 with moderate disease by cath 2017), HTN, HLD, hyperglycemia with A1C 7.5 in 06/2017 (previously pre-diabetic by A1Cs), COPD, OSA on CPAP, parapneumonic pleural effusion r/t PNA s/p thoracentesis 2008, h/o asbestos exposure, remote tobacco abuse (only 5 year history of smoking), probable chronic diastolic CHF who presents for pre-op evaluation.  To recap last studies, last studies include 2017 nuclear stress test that normal, no evidence of ischemia, EF 65% - done for dyspnea. CTA 11/11/15 showed no evidence of PE, stable pulm nodule consistent with benign etiology. Labs notable for normal Cr 0.97, Hgb 12.5, BNP wnl. I saw him in follow-up 11/22/15 at which time he was continuing to have significant dspnea and diaphoresis, similar to prior angina. We added Imdur. Given continued sx, we proceeded with heart cath for definitive eval. 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 LVEF was normal but LVEDP was 29HBZJ, likely diastolic dysfunction. Lasix was recommended. He had knee surgery 04/2017. Since last time I saw him in 2017, Dr. Tamala Hatfield notes he's had to have treatment for his COPD. Last labs 07/2017 showed glucose 107, Cr 0.96, K 4.2, TSH 5 requiring increase in thyroid, LFTs wnl 06/2017. Of note, A1C 7.5 in 06/2017 - checked by primary care - previous values pre-DM, but had been on steroids. Last LDL 72 in 11/2016.   He returns for pre-op eval. He is to undergo laparoscopic-assisted right colectomy, possible open  colectomy. This would require holding of his aspirin per intake information. [facility] [place]'s note indicates the patient had numerous polyps on a colonoscopy recently with an 8cm mass at the hepatic flexure, and infrarenal aortic ectasia measuring 2.7cm. There is also mention that Dr. Tamala Hatfield has cleared patient. There is a phone note from Dr. Thompson Caul nurse in 07/2017 documenting that he had a conversation with GI Dr. Oletta Hatfield which I suspect is where that clearance may have come from. The patient affirms he's been doing well lately. His breathing is actually better than it's been in a while. He's not had any more COPD flares. No chest pain, orthopnea, PND, LEE (the edema is now controlled with daily Lasix). He does notice a generally decreased stamina with longer activities but no specific angina. He is able to do housework and walk up a hill/steps without angina.   Past Medical History:  Diagnosis Date  . Acute respiratory failure ([place]) 09/25/11   hypoxic  . Anxiety   . CAD (coronary artery disease)    a. PTCA 2001. b. f/u cath 05/2012 heavy calcification, no PCI required. c. f/u cath for recurrent sx 2017: stable mod CAD unchanged from prior, mild progression of small vessel disease, elevated LVEDP.  . Colon polyps   . COPD (chronic obstructive pulmonary disease) ([place])   . GERD (gastroesophageal reflux disease)   . High cholesterol   . Hyperglycemia   . Hypertension   . Hypothyroidism   . Peripheral vascular disease ([place])    patient denies knowledge of, but listed in his chart  . Pleural  effusion    Parapneumonic ?r/t PNA s/p thoracentesis 2008  . Pneumonia    hx  . Sleep apnea    cpap     Past Surgical History:  Procedure Laterality Date  . CARDIAC CATHETERIZATION N/A 11/25/2015   Procedure: Left Heart Cath and Coronary Angiography;  Surgeon: Leonie Man, MD;  Location: [place] [facility];  Service: Cardiovascular;  Laterality: N/A;  . CATARACT EXTRACTION W/ INTRAOCULAR LENS   IMPLANT, BILATERAL  ~ 2006  . CHOLECYSTECTOMY N/A 10/11/2012   Procedure: LAPAROSCOPIC CHOLECYSTECTOMY WITH INTRAOPERATIVE CHOLANGIOGRAM;  Surgeon: Adin Hector, MD;  Location: [place];  Service: General;  Laterality: N/A;  . COLONOSCOPY    . CORONARY ANGIOPLASTY  01/2000  . HEMORRHOID SURGERY  1960's  . KNEE ARTHROSCOPY Left 04/19/2017   Procedure: ARTHROSCOPY KNEE, SUBCHONDROPLASTY;  Surgeon: Dorna Leitz, MD;  Location: [place];  Service: Orthopedics;  Laterality: Left;  . KNEE ARTHROSCOPY WITH MEDIAL MENISECTOMY Left 04/19/2017   Procedure: KNEE ARTHROSCOPY WITH MEDIAL AND LATERAL MENISECTOMY;  Surgeon: Dorna Leitz, MD;  Location: [place];  Service: Orthopedics;  Laterality: Left;    Current Medications: Current Meds  Medication Sig  . albuterol (PROVENTIL HFA;VENTOLIN HFA) 108 (90 Base) MCG/ACT inhaler Inhale 2 puffs every 6 (six) hours as needed into the lungs for wheezing or shortness of breath.  . AMBULATORY NON FORMULARY MEDICATION Medication Name: 4 prong cane. Dx: gait instability  . amoxicillin-clavulanate (AUGMENTIN) 875-125 MG tablet Take 1 tablet by mouth 2 (two) times daily.  . Ascorbic Acid (VITAMIN C PO) Take 1 tablet by mouth daily.  Marland Kitchen aspirin EC 81 MG tablet Take 81 mg by mouth daily.    Marland Kitchen atorvastatin (LIPITOR) 40 MG tablet TAKE 1 TABLET BY MOUTH AT  BEDTIME  . escitalopram (LEXAPRO) 10 MG tablet TAKE 1 TABLET BY MOUTH  DAILY  . fluticasone (FLONASE) 50 MCG/ACT nasal spray One spray in each nostril twice a day, use left hand for right nostril, and right hand for left nostril.  . furosemide (LASIX) 40 MG tablet Take 1 tablet (40 mg total) by mouth daily.  Marland Kitchen ipratropium-albuterol (DUONEB) 0.5-2.5 (3) MG/3ML SOLN USE 1 VIAL VIA NEBULIZER EVERY 2 HOURS AS NEEDED FOR WHEEZING, SHORTNESS OF BREATH.  . isosorbide mononitrate (IMDUR) 30 MG 24 hr tablet Take 30 mg by mouth daily.  Marland Kitchen levothyroxine (SYNTHROID, LEVOTHROID) 125 MCG tablet Take 1 tablet (125 mcg total) by mouth daily  before breakfast. TAKE 1 TABLET BY MOUTH  DAILY BEFORE BREAKFAST  . metFORMIN (GLUCOPHAGE-XR) 500 MG 24 hr tablet Take 1 tablet (500 mg total) by mouth daily with breakfast.  . Multiple Vitamins-Minerals (ICAPS AREDS 2) CAPS Take 1 capsule 2 (two) times daily by mouth.  . nitroGLYCERIN (NITROSTAT) 0.4 MG SL tablet DISSOLVE 1 TABLET UNDER THE TONGUE EVERY 5 MINUTES AS  NEEDED CHEST PAIN  . Tiotropium Bromide-Olodaterol (STIOLTO RESPIMAT) 2.5-2.5 MCG/ACT AERS Inhale 2 puffs into the lungs daily.  Marland Kitchen triamcinolone cream (KENALOG) 0.1 % APPLY EXTERNALLY TO THE AFFECTED AREA DAILY FOR UP TO 2 WEEKS AS NEEDED. AVOID FACE (Patient taking differently: APPLY EXTERNALLY TO THE AFFECTED AREA DAILY FOR UP TO 2 WEEKS AS NEEDED FOR ITCHING . AVOID FACE)  . VESICARE 5 MG tablet Take 5 mg by mouth daily.  Marland Kitchen zolpidem (AMBIEN) 10 MG tablet Take 1 tablet (10 mg total) by mouth at bedtime as needed for sleep. Ok to refill 06/01/2016    Allergies:   Patient has no known allergies.   Social History  Socioeconomic History  . Marital status: Married    Spouse name: None  . Number of children: None  . Years of education: None  . Highest education level: None  Social Needs  . Financial resource strain: None  . Food insecurity - worry: None  . Food insecurity - inability: None  . Transportation needs - medical: None  . Transportation needs - non-medical: None  Occupational History  . Occupation: Retired    Comment: Surveyor, quantity - [employer]  Tobacco Use  . Smoking status: Former Smoker    Packs/day: 1.00    Years: 10.00    Pack years: 10.00    Types: Cigarettes    Last attempt to quit: 06/01/1982    Years since quitting: 35.2  . Smokeless tobacco: Never Used  . Tobacco comment: "quit smoking ~ 25 years ago; smoked ~ 1 pk/wk"  Substance and Sexual Activity  . Alcohol use: No  . Drug use: No  . Sexual activity: No  Other Topics Concern  . None  Social History Narrative  . None     Family History:    Family History  Problem Relation Age of Onset  . Stroke Mother   . Arthritis Mother   . Asthma Mother   . Emphysema Mother   . Stroke Father   . Arthritis Sister   . Stroke Sister   . Heart attack Neg Hx     ROS:   Please see the history of present illness. Had rectal bleeding previously but this resolved - this is what prompted colonoscopy. All other systems are reviewed and otherwise negative.    PHYSICAL EXAM:   VS:  BP 124/74 (BP Location: Left Arm, Patient Position: Sitting, Cuff Size: Large)   Pulse 76   Ht 5\' 11"  (1.803 m)   Wt 268 lb 3.2 oz (121.7 kg)   SpO2 97%   BMI 37.41 kg/m   BMI: Body mass index is 37.41 kg/m. GEN: Well nourished, well developed obese WM, in no acute distress  HEENT: normocephalic, atraumatic Neck: no JVD, carotid bruits, or masses Cardiac: RRR; no murmurs, rubs, or gallops, no edema  Respiratory:  clear to auscultation bilaterally, normal work of breathing GI: soft, nontender, nondistended, + BS MS: no deformity or atrophy  Skin: warm and dry, no rash Neuro:  Alert and Oriented x 3, Strength and sensation are intact, follows commands Psych: euthymic mood, full affect  Wt Readings from Last 3 Encounters:  08/16/17 268 lb 3.2 oz (121.7 kg)  08/13/17 265 lb (120.2 kg)  08/11/17 262 lb (118.8 kg)      Studies/Labs Reviewed:   EKG:  EKG was ordered today and personally reviewed by me and demonstrates NSR 76bpm, left axis deviation, poor r wave progression, nonspecific TW changes III no acute change.  Recent Labs: 05/23/2017: B Natriuretic Peptide 117.0; Hemoglobin 12.5; Platelets 166 06/10/2017: ALT 24 07/26/2017: BUN 21; Creat 0.96; Potassium 4.2; Sodium 140; TSH 5.07   Lipid Panel    Component Value Date/Time   CHOL 161 12/07/2016 0743   TRIG 80 12/07/2016 0743   HDL 73 12/07/2016 0743   CHOLHDL 2.2 12/07/2016 0743   VLDL 17 09/05/2014 0837   LDLCALC 69 09/05/2014 0837    Additional studies/ records that were reviewed today  include: Summarized above.    ASSESSMENT & PLAN:   1. Pre-op cardiovascular evaluation - RCRI is calculated at >11% given the type of surgery and comorbidities. He understands that his age, coronary disease and lung disease  put him at least moderate risk of surgery regardless. EKG today is unchanged and he has not had any new angina. He is able to perform about 6 METS. I will reach out to Dr. Tamala Hatfield to find out what discussion he's previously had with regards to clearance.  2. CAD - no recent new angina. Continue current regimen. 3. Hypertension - controlled.  4. Hyperlipidemia - continue statin. LDL was near goal 11/2016. 5. Chronic diastolic CHF - he reports resolution of edema ever since being on Lasix 40mg  daily. Continue present regimen.   Disposition: F/u with Dr. Tamala Hatfield in 6 months.   Medication Adjustments/Labs and Tests Ordered: Current medicines are reviewed at length with the patient today.  Concerns regarding medicines are outlined above. Medication changes, Labs and Tests ordered today are summarized above and listed in the Patient Instructions accessible in Encounters.   Signed, Charlie Pitter, PA-C  08/16/2017 12:23 PM    [facility] [address], [place], [place]  [address] Phone: [phone]; Fax: [phone]

## 2017-08-16 ENCOUNTER — Encounter: Payer: Self-pay | Admitting: Physician Assistant

## 2017-08-16 ENCOUNTER — Ambulatory Visit: Payer: Medicare Other | Admitting: Physician Assistant

## 2017-08-16 VITALS — BP 124/74 | HR 76 | Ht 71.0 in | Wt 268.2 lb

## 2017-08-16 DIAGNOSIS — I10 Essential (primary) hypertension: Secondary | ICD-10-CM

## 2017-08-16 DIAGNOSIS — E785 Hyperlipidemia, unspecified: Secondary | ICD-10-CM

## 2017-08-16 DIAGNOSIS — I5032 Chronic diastolic (congestive) heart failure: Secondary | ICD-10-CM

## 2017-08-16 DIAGNOSIS — Z01810 Encounter for preprocedural cardiovascular examination: Secondary | ICD-10-CM | POA: Diagnosis not present

## 2017-08-16 DIAGNOSIS — I2510 Atherosclerotic heart disease of native coronary artery without angina pectoris: Secondary | ICD-10-CM

## 2017-08-16 NOTE — Patient Instructions (Signed)
Medication Instructions:  Your physician recommends that you continue on your current medications as directed. Please refer to the Current Medication list given to you today.   Labwork: None ordered  Testing/Procedures: None ordered  Follow-Up: Your physician wants you to follow-up in: [place] will receive a reminder letter in the mail two months in advance. If you don't receive a letter, please call our office to schedule the follow-up appointment.   Any Other Special Instructions Will Be Listed Below (If Applicable).     If you need a refill on your cardiac medications before your next appointment, please call your pharmacy.

## 2017-08-17 ENCOUNTER — Other Ambulatory Visit: Payer: Self-pay | Admitting: *Deleted

## 2017-08-17 MED ORDER — METFORMIN HCL ER 500 MG PO TB24: 500.0000 mg | ORAL_TABLET | Freq: Every day | ORAL | 1 refills | Status: DC

## 2017-08-17 MED ORDER — LEVOTHYROXINE SODIUM 125 MCG PO TABS: 125.0000 ug | ORAL_TABLET | Freq: Every day | ORAL | 1 refills | Status: DC

## 2017-08-17 NOTE — Progress Notes (Signed)
Proceed with surgery since the patient was stable.  Okay to hold aspirin if required.

## 2017-08-18 ENCOUNTER — Telehealth: Payer: Self-pay | Admitting: Physician Assistant

## 2017-08-18 NOTE — Telephone Encounter (Signed)
Per review with Dr. [name], Dr. [name] feels he is OK to proceed with surgery and OK to hold aspirin as requested. Please let the pt know. No action is needed to notify the surgical team as they were already aware since Dr. [name] had spoken with Dr. [name]. Per pt, surgical team had already given him instructions on when to begin holding aspirin. Webber Michiels PA-C

## 2017-08-18 NOTE — Telephone Encounter (Signed)
Called pt to let him know that Dr. Elnora Morrison ok'd for him to proceed with surgery and hold his ASA as instructed by Dr. [name], the surgeon.  Pt thanked me for the call.

## 2017-08-23 ENCOUNTER — Other Ambulatory Visit: Payer: Self-pay | Admitting: Family Medicine

## 2017-08-24 NOTE — Pre-Procedure Instructions (Addendum)
Bryan Hatfield [facility]  08/24/2017      [facility], [place] AT [address] [place] [address] Phone: [phone] Fax: [phone]    Your procedure is scheduled on April 2.  Report to [facility] Admitting at 600 A.M.  Call this number if you have problems the morning of surgery:  [phone]   Remember:  Do not eat food or drink liquids after midnight.  Take these medicines the morning of surgery with A SIP OF WATER   Albuterol inhaler- bring your inhaler with you on the day of surgery Amoxicillin (Augmentin) escitalopram (Lexapro) Flonase nasal spray Duoneb if needed Isosorbide mononitrate (Imdur) Levothyroxine (Synthroid)  Nitro stat if needed Tiotropium Bromide-Olodaterol (Stiolto Respimat)-bring with you on the day of surgery Vesicare    Stop taking aspirin as directed by your Dr. Stop taking BC's, Goody's, Herbal medications, Fish Oil, Aleve, Ibuprofen, Advil, Motrin, Vitamins     How to Manage Your Diabetes Before and After Surgery  Why is it important to control my blood sugar before and after surgery? . Improving blood sugar levels before and after surgery helps healing and can limit problems. . A way of improving blood sugar control is eating a healthy diet by: o  Eating less sugar and carbohydrates o  Increasing activity/exercise o  Talking with your doctor about reaching your blood sugar goals . High blood sugars (greater than 180 mg/dL) can raise your risk of infections and slow your recovery, so you will need to focus on controlling your diabetes during the weeks before surgery. . Make sure that the doctor who takes care of your diabetes knows about your planned surgery including the date and location.  How do I manage my blood sugar before surgery? . Check your blood sugar at least 4 times a day, starting 2 days before surgery, to make sure that the level is not  too high or low. o Check your blood sugar the morning of your surgery when you wake up and every 2 hours until you get to the Short Stay unit. . If your blood sugar is less than 70 mg/dL, you will need to treat for low blood sugar: o Do not take insulin. o Treat a low blood sugar (less than 70 mg/dL) with  cup of clear juice (cranberry or apple), 4 glucose tablets, OR glucose gel. Recheck blood sugar in 15 minutes after treatment (to make sure it is greater than 70 mg/dL). If your blood sugar is not greater than 70 mg/dL on recheck, call [phone] o  for further instructions. . Report your blood sugar to the short stay nurse when you get to Short Stay.  . If you are admitted to the hospital after surgery: o Your blood sugar will be checked by the staff and you will probably be given insulin after surgery (instead of oral diabetes medicines) to make sure you have good blood sugar levels. o The goal for blood sugar control after surgery is 80-180 mg/dL.              WHAT DO I DO ABOUT MY DIABETES MEDICATION?   Marland Kitchen Do not take oral diabetes medicines (pills) the morning of surgery.  Metformin (Glucophage- XR)       Other Instructions:          Patient Signature:  Date:   Nurse Signature:  Date:   Reviewed and  Endorsed by [facility] Patient Education Committee, August 2015  Do not wear jewelry, make-up or nail polish.  Do not wear lotions, powders, or perfumes, or deodorant.  Do not shave 48 hours prior to surgery.  Men may shave face and neck.  Do not bring valuables to the hospital.  [facility] is not responsible for any belongings or valuables.  Contacts, dentures or bridgework may not be worn into surgery.  Leave your suitcase in the car.  After surgery it may be brought to your room.  For patients admitted to the hospital, discharge time will be determined by your treatment team.  Patients discharged the day of surgery will not be allowed to drive home.    Special instructions:   [name]  Before surgery, you can play an important role. Because skin is not sterile, your skin needs to be as free of germs as possible. You can reduce the number of germs on your skin by washing with CHG (chlorahexidine gluconate) Soap before surgery.  CHG is an antiseptic cleaner which kills germs and bonds with the skin to continue killing germs even after washing.  Please do not use if you have an allergy to CHG or antibacterial soaps. If your skin becomes reddened/irritated stop using the CHG.  Do not shave (including legs and underarms) for at least 48 hours prior to first CHG shower. It is OK to shave your face.  Please follow these instructions carefully.   1. Shower the NIGHT BEFORE SURGERY and the MORNING OF SURGERY with CHG.   2. If you chose to wash your hair, wash your hair first as usual with your normal shampoo.  3. After you shampoo, rinse your hair and body thoroughly to remove the shampoo.  4. Use CHG as you would any other liquid soap. You can apply CHG directly to the skin and wash gently with a scrungie or a clean washcloth.   5. Apply the CHG Soap to your body ONLY FROM THE NECK DOWN.  Do not use on open wounds or open sores. Avoid contact with your eyes, ears, mouth and genitals (private parts). Wash Face and genitals (private parts)  with your normal soap.  6. Wash thoroughly, paying special attention to the area where your surgery will be performed.  7. Thoroughly rinse your body with warm water from the neck down.  8. DO NOT shower/wash with your normal soap after using and rinsing off the CHG Soap.  9. Pat yourself dry with a CLEAN TOWEL.  10. Wear CLEAN PAJAMAS to bed the night before surgery, wear comfortable clothes the morning of surgery  11. Place CLEAN SHEETS on your bed the night of your first shower and DO NOT SLEEP WITH PETS.    Day of Surgery: Do not apply any deodorants/lotions. Please  wear clean clothes to the hospital/surgery center.      Please read over the following fact sheets that you were given. Pain Booklet, Coughing and Deep Breathing and Surgical Site Infection Prevention

## 2017-08-25 ENCOUNTER — Encounter (HOSPITAL_COMMUNITY): Payer: Self-pay

## 2017-08-25 ENCOUNTER — Encounter (HOSPITAL_COMMUNITY)
Admission: RE | Admit: 2017-08-25 | Discharge: 2017-08-25 | Disposition: A | Discharge status: Home / Self Care | Payer: Medicare Other | Source: Ambulatory Visit | Attending: General Surgery | Admitting: General Surgery

## 2017-08-25 ENCOUNTER — Other Ambulatory Visit: Payer: Self-pay

## 2017-08-25 DIAGNOSIS — E7800 Pure hypercholesterolemia, unspecified: Secondary | ICD-10-CM | POA: Diagnosis not present

## 2017-08-25 DIAGNOSIS — Z79899 Other long term (current) drug therapy: Secondary | ICD-10-CM | POA: Diagnosis not present

## 2017-08-25 DIAGNOSIS — Z7984 Long term (current) use of oral hypoglycemic drugs: Secondary | ICD-10-CM | POA: Insufficient documentation

## 2017-08-25 DIAGNOSIS — G4730 Sleep apnea, unspecified: Secondary | ICD-10-CM | POA: Diagnosis not present

## 2017-08-25 DIAGNOSIS — I10 Essential (primary) hypertension: Secondary | ICD-10-CM | POA: Diagnosis not present

## 2017-08-25 DIAGNOSIS — I25119 Atherosclerotic heart disease of native coronary artery with unspecified angina pectoris: Secondary | ICD-10-CM | POA: Insufficient documentation

## 2017-08-25 DIAGNOSIS — Z01818 Encounter for other preprocedural examination: Secondary | ICD-10-CM | POA: Diagnosis not present

## 2017-08-25 DIAGNOSIS — K219 Gastro-esophageal reflux disease without esophagitis: Secondary | ICD-10-CM | POA: Diagnosis not present

## 2017-08-25 DIAGNOSIS — Z7982 Long term (current) use of aspirin: Secondary | ICD-10-CM | POA: Diagnosis not present

## 2017-08-25 DIAGNOSIS — K639 Disease of intestine, unspecified: Secondary | ICD-10-CM | POA: Insufficient documentation

## 2017-08-25 DIAGNOSIS — J449 Chronic obstructive pulmonary disease, unspecified: Secondary | ICD-10-CM | POA: Insufficient documentation

## 2017-08-25 DIAGNOSIS — E039 Hypothyroidism, unspecified: Secondary | ICD-10-CM | POA: Diagnosis not present

## 2017-08-25 DIAGNOSIS — Z87891 Personal history of nicotine dependence: Secondary | ICD-10-CM | POA: Insufficient documentation

## 2017-08-25 DIAGNOSIS — E1151 Type 2 diabetes mellitus with diabetic peripheral angiopathy without gangrene: Secondary | ICD-10-CM | POA: Insufficient documentation

## 2017-08-25 DIAGNOSIS — Z79890 Hormone replacement therapy (postmenopausal): Secondary | ICD-10-CM | POA: Diagnosis not present

## 2017-08-25 HISTORY — DX: Unspecified osteoarthritis, unspecified site: M19.90

## 2017-08-25 HISTORY — DX: Bronchitis, not specified as acute or chronic: J40

## 2017-08-25 HISTORY — DX: Type 2 diabetes mellitus without complications: E11.9

## 2017-08-25 LAB — COMPREHENSIVE METABOLIC PANEL
ALBUMIN: 3.4 g/dL — AB (ref 3.5–5.0)
ALT: 25 U/L (ref 17–63)
AST: 24 U/L (ref 15–41)
Alkaline Phosphatase: 82 U/L (ref 38–126)
Anion gap: 10 (ref 5–15)
BUN: 15 mg/dL (ref 6–20)
CHLORIDE: 105 mmol/L (ref 101–111)
CO2: 24 mmol/L (ref 22–32)
CREATININE: 0.83 mg/dL (ref 0.61–1.24)
Calcium: 8.7 mg/dL — ABNORMAL LOW (ref 8.9–10.3)
GFR calc Af Amer: >60 mL/min (ref >60)
GFR calc non Af Amer: >60 mL/min (ref >60)
GLUCOSE: 90 mg/dL (ref 65–99)
Potassium: 4.5 mmol/L (ref 3.5–5.1)
SODIUM: 139 mmol/L (ref 135–145)
Total Bilirubin: 0.6 mg/dL (ref 0.3–1.2)
Total Protein: 6.3 g/dL — ABNORMAL LOW (ref 6.5–8.1)

## 2017-08-25 LAB — CBC WITH DIFFERENTIAL/PLATELET
BASOS ABS: 0 10*3/uL (ref 0.0–0.1)
Basophils Relative: 0 %
EOS ABS: 0.3 10*3/uL (ref 0.0–0.7)
EOS PCT: 4 %
HCT: 42 % (ref 39.0–52.0)
Hemoglobin: 13.5 g/dL (ref 13.0–17.0)
LYMPHS PCT: 25 %
Lymphs Abs: 1.9 10*3/uL (ref 0.7–4.0)
MCH: 30.2 pg (ref 26.0–34.0)
MCHC: 32.1 g/dL (ref 30.0–36.0)
MCV: 94 fL (ref 78.0–100.0)
Monocytes Absolute: 0.4 10*3/uL (ref 0.1–1.0)
Monocytes Relative: 6 %
Neutro Abs: 4.8 10*3/uL (ref 1.7–7.7)
Neutrophils Relative %: 65 %
PLATELETS: 173 10*3/uL (ref 150–400)
RBC: 4.47 MIL/uL (ref 4.22–5.81)
RDW: 14.1 % (ref 11.5–15.5)
WBC: 7.4 10*3/uL (ref 4.0–10.5)

## 2017-08-25 LAB — HEMOGLOBIN A1C
Hgb A1c MFr Bld: 6.4 % — ABNORMAL HIGH (ref 4.8–5.6)
Mean Plasma Glucose: 136.98 mg/dL

## 2017-08-25 LAB — ABO/RH: ABO/RH(D): O NEG

## 2017-08-25 LAB — GLUCOSE, CAPILLARY: Glucose-Capillary: 88 mg/dL (ref 65–99)

## 2017-08-25 NOTE — Progress Notes (Signed)
PCP Dr. Jearld Lesch Cardiologist - Dr. [name]; clearance in [place] 08/16/2017  Chest x-ray - 05/23/2017 EKG - 08/16/2017 Stress Test - 11/05/2015 ECHO -  Cardiac Cath - 11/25/2015  Sleep Study - at the [facility] in [place] CPAP - yes; instructed to bring mask the day of surgery  Fasting Blood Sugar - patient unsure, newly diagnosed with DM type 2 Checks Blood Sugar _____ times a day  Blood Thinner Instructions:n/a Aspirin Instructions: patient stated he already stopped ASA per Dr. [name]  Anesthesia review: yes, cardiac history  Patient denies shortness of breath, fever, cough and chest pain at PAT appointment   Patient verbalized understanding of instructions that were given to them at the PAT appointment. Patient was also instructed that they will need to review over the PAT instructions again at home before surgery.

## 2017-08-26 LAB — CEA: CEA: 3.7 ng/mL (ref 0.0–4.7)

## 2017-08-26 NOTE — Progress Notes (Signed)
Anesthesia Chart Review:  Pt is an 84 year old male scheduled for laparoscopic assisted ascending colectomy on 08/31/2017 with Fanny Skates, MD  - PCP is Beatrice Lecher, MD. Last office visit 08/11/17 for sinusitis; augmentin prescribed.  - Cardiologist is [name], MD. Pt cleared for surgery at last office visit 08/16/17 with Melina Copa, PA  PMH includes:  CAD (PCI 2001; moderate disease by 2017 cath), HTN, DM, hyperlipidemia, COPD, OSA, parapneumonic pleural effusion (s/p thoracentesis 2008), hypothyroidism, GERD. Former smoker (quit 1984). BMI 36.5. S/p L knee arthroscopy 04/19/17. S/p cholecystectomy 10/11/12  Medications include: albuterol, augmentin, ASA 81mg , lipitor, lasix, duoneb, imdur, levothyroxine, metformin, stiolto respimat  BP (!) 149/68   Pulse 76   Temp 36.7 C   Resp 20   Ht 5\' 11"  (1.803 m)   Wt 262 lb 1.6 oz (118.9 kg)   SpO2 97%   BMI 36.56 kg/m   Preoperative labs reviewed.   - HbA1c 6.4, glucose 90  CXR 05/23/17: Mild vascular congestion. Mild bibasilar atelectasis. Peribronchial thickening seen  EKG 08/16/17: NSR. LAD  Cardiac cath 11/25/15:   1st RPLB lesion, 80% stenosed. Small caliber branch vessel. No change from prior catheterization  Ost 3rd Diag lesion, 60% stenosed. Moderate caliber vessel. No change from prior catheterization  Dist LAD-2 lesion, 55% stenosed. Distal/apical LAD diffusely diseased. No change from last catheterization  Dist LAD-1 lesion, 40% stenosed in what appears to be a myocardial bridging segment. No change from prior catheterization  Ost Cx lesion, 40% stenosed. Also no change from prior catheterization  The left ventricular systolic function is normal.  Mildly elevated LVEDP of 20 mmHg. Likely diastolic dysfunction. - Graphically, relatively similar coronary artery disease compared to prior catheterization. Mild progression of small vessel branch disease. Normal EF, but mildly elevated LVEDP. Considers diastolic  dysfunction. I don't think this would given the extent of dyspnea with exertion but he is having. Sign may consider low-dose diuretic, but I think major issues likely noncardiac.  If no changes, I anticipate pt can proceed with surgery as scheduled.   Willeen Cass, FNP-BC [facility] Short Stay Surgical Center/Anesthesiology Phone: [phone] 08/26/2017 3:34 PM

## 2017-08-27 ENCOUNTER — Telehealth: Payer: Self-pay | Admitting: Family Medicine

## 2017-08-27 NOTE — Telephone Encounter (Signed)
His thyroid med was sent to ail order 10 days ago.  Does he need a short supply sent locally?  Or has he not heard from his mail order?

## 2017-08-27 NOTE — Telephone Encounter (Signed)
Patient is out of his thyroid medicine and needs this called in

## 2017-08-29 ENCOUNTER — Other Ambulatory Visit: Payer: Self-pay | Admitting: Family Medicine

## 2017-08-30 NOTE — H&P (Signed)
Bryan Hatfield Location: [facility] Patient #: [id number] DOB: 1926-11-16 Married / Language: English / Race: White Male        History of Present Illness       The patient is a 84 year old male who presents with a colonic mass. 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 Bryan Hatfield is his PCP. Bryan Hatfield is his cardiologist. He is a former patient of mine, having undergone laparoscopic cholecystectomy in 2014.     The patient and his wife are here together. His health has been stable despite his advanced age. He is independent and drives his car. He noticed some blood in his stool. Colonoscopy showed numerous tiny polyps which were removed. There was an 8 cm mass at the hepatic flexure which was tattooed. Surface biopsies showed villous adenoma. CT scan shows perhaps some luminal narrowing of the ascending colon but no large mass or adenopathy. Liver looks normal. He has infrarenal aortic ectasia measuring 2.7 cm. Some diverticulosis noted. Dr. Oletta Hatfield talked to him about [facility] and talked about the pros and cons of that. He also discussed surgery. The patient is more interested in surgical management. Dr. Tamala Hatfield has performed cardiac risk assessment stating that he is low to intermediate risk. cardiac clearance is completed.       Comorbidities include some coronary artery disease although he has never had an myocardial infarction. He has no chest pain or exertional dyspnea. Angioplasty 2001. Cardiac catheterization November 25, 2015 which apparently looked pretty good. BMI 36. Hypertension. COPD. Hypothyroidism. Non-insulin-dependent diabetes. Some urinary incontinence and wears a diaper. Left knee surgery under general anesthesia in November 2018 and did well. Recent CBC and liver function tests look  good. CEA will be drawn. Laparoscopic cholecystectomy 2014. Cataract surgery. Family history father deceased with CVA. Mother deceased arthritis asthma CVA. Sister deceased CVA arthritis. No family history of colon cancer Social history married. Wife present. She ambulates with a cane. She drives a car. Former smoker. Quit in 1984. No alcohol.       We had a very long discussion with the patient and his wife. His performance status is reasonable and I think he is a surgical candidate. I described laparoscopic assisted right colectomy, possible open colectomy. I discussed the indications, details, techniques, and numerous risk of the surgery with him. He is aware the risks of bleeding, infection, conversion to open laparotomy, injury to adjacent organs with major reconstructive surgery, wound healing problems such as hernia or dehiscence, cardiac pulmonary thromboembolic problems. Discharge to nursing home if there are problems.. Involve physical therapy and occupational therapy as inpatient. He understands all of these issues well. All his questions are answered. He agrees with this plan  Mechanical and antibiotic bowel prep arranged  [facility] because of cardiac history Involve PT and OT as inpatient. Plan entereg   Past Surgical History Cataract Surgery  Left. Colon Polyp Removal - Colonoscopy  Gallbladder Surgery - Laparoscopic   Diagnostic Studies History  Colonoscopy  within last year  Allergies  No Known Drug Allergies   Medication History  MetFORMIN HCl ER (500MG  Tablet ER 24HR, Oral) Active. Furosemide (40MG  Tablet, Oral) Active. Levothyroxine Sodium (125MCG Tablet, Oral) Active. Nitroglycerin (0.4MG  Tab Sublingual, Sublingual) Active. Triamcinolone Acetonide (0.1% Cream, External) Active. Baby Aspirin (81MG  Tablet Chewable, Oral) Active. Atorvastatin Calcium (40MG  Tablet, Oral) Active.  Compressor/Nebulizer Active. Lexapro (10MG   Tablet, Oral) Active. Losartan Potassium (50MG  Tablet, Oral) Active. Nitrostat (0.4MG  Tab Sublingual, Sublingual) Active. Sertraline HCl (100MG  Tablet, Oral) Active. Simvastatin (80MG  Tablet, Oral) Active. Terazosin HCl (2MG  Capsule, Oral) Active. Medications Reconciled  Social History Caffeine use  Coffee.  Family History Colon Polyps  Father.  Other Problems  Sleep Apnea     Review of Systems Hematology Present- Blood Thinners. Not Present- Easy Bruising, Excessive bleeding, Gland problems, HIV and Persistent Infections.  Vitals   Weight: 259 lb Height: 71in Body Surface Area: 2.35 m Body Mass Index: 36.12 kg/m  Pulse: 96 (Regular)  BP: 132/82 (Sitting, Left Arm, Standard)    Physical Exam  General Mental Status-Alert. General Appearance-Consistent with stated age. Hydration-Well hydrated. Voice-Normal. Note: BMI 36.   Head and Neck Head-normocephalic, atraumatic with no lesions or palpable masses. Trachea-midline. Thyroid Gland Characteristics - normal size and consistency.  Eye Eyeball - Bilateral-Extraocular movements intact. Sclera/Conjunctiva - Bilateral-No scleral icterus.  Chest and Lung Exam Chest and lung exam reveals -quiet, even and easy respiratory effort with no use of accessory muscles and on auscultation, normal breath sounds, no adventitious sounds and normal vocal resonance. Inspection Chest Wall - Normal. Back - normal.  Breast Breast - Left-Symmetric, Non Tender, No Biopsy scars, no Dimpling, No Inflammation, No Lumpectomy scars, No Mastectomy scars, No Peau d' Orange. Breast - Right-Symmetric, Non Tender, No Biopsy scars, no Dimpling, No Inflammation, No Lumpectomy scars, No Mastectomy scars, No Peau d' Orange. Breast Lump-No Palpable Breast Mass.  Cardiovascular Cardiovascular examination reveals -normal heart sounds, regular rate and rhythm with no murmurs and normal pedal pulses  bilaterally.  Abdomen Inspection Inspection of the abdomen reveals - No Hernias. Skin - Scar - Note: Trocar sites from laparoscopic cholecystectomy well-healed. Palpation/Percussion Palpation and Percussion of the abdomen reveal - Soft, Non Tender, No Rebound tenderness, No Rigidity (guarding) and No hepatosplenomegaly. Auscultation Auscultation of the abdomen reveals - Bowel sounds normal.  Neurologic Neurologic evaluation reveals -alert and oriented x 3 with no impairment of recent or remote memory. Mental Status-Normal.  Musculoskeletal Normal Exam - Left-Upper Extremity Strength Normal and Lower Extremity Strength Normal. Normal Exam - Right-Upper Extremity Strength Normal and Lower Extremity Strength Normal.  Lymphatic Head & Neck  General Head & Neck Lymphatics: Bilateral - Description - Normal. Axillary  General Axillary Region: Bilateral - Description - Normal. Tenderness - Non Tender. Femoral & Inguinal  Generalized Femoral & Inguinal Lymphatics: Bilateral - Description - Normal. Tenderness - Non Tender.    Assessment & Plan  MASS OF HEPATIC FLEXURE OF COLON (K63.9)   You have recently seen some blood in your stools. your colonoscopy shows a large, 8 cm mass in the hepatic flexure which is more on the right side Surface biopsy shows villous adenoma but no cancer cells were seen It is quite possible that you may have cancer deeper within this mass You had numerous tiny polyps which are of no concern.  your CT scan does not show any sign of widespread cancer and there is no large mass or enlarged lymph nodes. That is good news We have talked about options including surgical removal, colonoscopic excision at one of Universities, even doing nothing which is probably not a good idea At the end of our lengthy discussion you have decided to proceed with laparoscopic-assisted right colectomy, possible open colectomy I discussed the indications, techniques, and  risk of this surgery in detail with you and your wife.  Dr. Tamala Hatfield feels that you are  a reasonable candidate for general anesthesia and you have had other surgery in the last year and done well. I think that you are a reasonable candidate for this surgery  you will need to undergo an mechanical and antibiotic prep prior to the surgery, and we explained that to you Stop your aspirin 2 days preop  Pt Education - [facility] Colon Bowel Prep 2018 ERAS/Miralax/Antibiotics Started Neomycin Sulfate 500 MG Oral Tablet, 2 (two) Tablet SEE NOTE, #6, 08/05/2017, No Refill. Local Order: TAKE TWO TABLETS AT 2 PM, 3 PM, AND 10 PM THE DAY PRIOR TO SURGERY Started Flagyl 500 MG Oral Tablet, 2 (two) Tablet SEE NOTE, #6, 08/05/2017, No Refill. Local Order: Take at 2pm, 3pm, and 10pm the day prior to your colon operation . CORONARY ARTERY DISEASE (I25.10) BMI 36.0-36.9,ADULT (Z68.36) HYPOTHYROIDISM, ADULT (E03.9) TYPE 2 DIABETES MELLITUS TREATED WITHOUT INSULIN (E11.9) COPD, MILD (J44.9) HISTORY OF CORONARY ANGIOPLASTY (Z98.61) Impression: Angioplasty 2001. Last cardiac catheterization November 25, 2015 looked good HISTORY OF LAPAROSCOPIC CHOLECYSTECTOMY (Z90.49) HYPERTENSION, ESSENTIAL (I10) URINARY INCONTINENCE, MALE, STRESS (N39.3) Impression: Wears a diaper. Not followed by urology    Edsel Petrin. Dalbert Batman, M.D., [facility] General and Minimally invasive Surgery Breast and Colorectal Surgery Office:   [phone] Pager:   [phone]

## 2017-08-30 NOTE — Telephone Encounter (Signed)
Pt does need short term supply. This has been sent.

## 2017-08-31 ENCOUNTER — Encounter (HOSPITAL_COMMUNITY)
Admission: RE | Disposition: A | Discharge status: Home Health | Payer: Self-pay | Source: Ambulatory Visit | Attending: General Surgery

## 2017-08-31 ENCOUNTER — Inpatient Hospital Stay (HOSPITAL_COMMUNITY): Payer: Medicare Other | Admitting: Anesthesiology

## 2017-08-31 ENCOUNTER — Encounter (HOSPITAL_COMMUNITY): Payer: Self-pay | Admitting: *Deleted

## 2017-08-31 ENCOUNTER — Other Ambulatory Visit: Payer: Self-pay

## 2017-08-31 ENCOUNTER — Inpatient Hospital Stay (HOSPITAL_COMMUNITY)
Admission: RE | Admit: 2017-08-31 | Discharge: 2017-09-04 | DRG: 330 | Disposition: A | Discharge status: Home Health | Payer: Medicare Other | Source: Ambulatory Visit | Attending: General Surgery | Admitting: General Surgery

## 2017-08-31 ENCOUNTER — Inpatient Hospital Stay (HOSPITAL_COMMUNITY): Payer: Medicare Other | Admitting: Emergency Medicine

## 2017-08-31 DIAGNOSIS — N393 Stress incontinence (female) (male): Secondary | ICD-10-CM | POA: Diagnosis present

## 2017-08-31 DIAGNOSIS — Z6836 Body mass index (BMI) 36.0-36.9, adult: Secondary | ICD-10-CM

## 2017-08-31 DIAGNOSIS — D123 Benign neoplasm of transverse colon: Secondary | ICD-10-CM | POA: Diagnosis not present

## 2017-08-31 DIAGNOSIS — Z9861 Coronary angioplasty status: Secondary | ICD-10-CM

## 2017-08-31 DIAGNOSIS — I5032 Chronic diastolic (congestive) heart failure: Secondary | ICD-10-CM | POA: Diagnosis present

## 2017-08-31 DIAGNOSIS — J449 Chronic obstructive pulmonary disease, unspecified: Secondary | ICD-10-CM | POA: Diagnosis not present

## 2017-08-31 DIAGNOSIS — D696 Thrombocytopenia, unspecified: Secondary | ICD-10-CM | POA: Diagnosis present

## 2017-08-31 DIAGNOSIS — I129 Hypertensive chronic kidney disease with stage 1 through stage 4 chronic kidney disease, or unspecified chronic kidney disease: Secondary | ICD-10-CM | POA: Diagnosis present

## 2017-08-31 DIAGNOSIS — K921 Melena: Secondary | ICD-10-CM | POA: Diagnosis not present

## 2017-08-31 DIAGNOSIS — I110 Hypertensive heart disease with heart failure: Secondary | ICD-10-CM | POA: Diagnosis not present

## 2017-08-31 DIAGNOSIS — D62 Acute posthemorrhagic anemia: Secondary | ICD-10-CM | POA: Diagnosis present

## 2017-08-31 DIAGNOSIS — I9581 Postprocedural hypotension: Secondary | ICD-10-CM | POA: Diagnosis not present

## 2017-08-31 DIAGNOSIS — N179 Acute kidney failure, unspecified: Secondary | ICD-10-CM | POA: Diagnosis not present

## 2017-08-31 DIAGNOSIS — K6389 Other specified diseases of intestine: Secondary | ICD-10-CM | POA: Diagnosis not present

## 2017-08-31 DIAGNOSIS — E039 Hypothyroidism, unspecified: Secondary | ICD-10-CM | POA: Diagnosis present

## 2017-08-31 DIAGNOSIS — Z7951 Long term (current) use of inhaled steroids: Secondary | ICD-10-CM

## 2017-08-31 DIAGNOSIS — D6959 Other secondary thrombocytopenia: Secondary | ICD-10-CM | POA: Diagnosis not present

## 2017-08-31 DIAGNOSIS — E669 Obesity, unspecified: Secondary | ICD-10-CM | POA: Diagnosis present

## 2017-08-31 DIAGNOSIS — D374 Neoplasm of uncertain behavior of colon: Secondary | ICD-10-CM | POA: Diagnosis present

## 2017-08-31 DIAGNOSIS — I2510 Atherosclerotic heart disease of native coronary artery without angina pectoris: Secondary | ICD-10-CM | POA: Diagnosis not present

## 2017-08-31 DIAGNOSIS — D120 Benign neoplasm of cecum: Secondary | ICD-10-CM | POA: Diagnosis not present

## 2017-08-31 DIAGNOSIS — Z6839 Body mass index (BMI) 39.0-39.9, adult: Secondary | ICD-10-CM

## 2017-08-31 DIAGNOSIS — R269 Unspecified abnormalities of gait and mobility: Secondary | ICD-10-CM | POA: Diagnosis not present

## 2017-08-31 DIAGNOSIS — Z7984 Long term (current) use of oral hypoglycemic drugs: Secondary | ICD-10-CM | POA: Diagnosis not present

## 2017-08-31 DIAGNOSIS — K219 Gastro-esophageal reflux disease without esophagitis: Secondary | ICD-10-CM | POA: Diagnosis not present

## 2017-08-31 DIAGNOSIS — E7800 Pure hypercholesterolemia, unspecified: Secondary | ICD-10-CM | POA: Diagnosis not present

## 2017-08-31 DIAGNOSIS — E1151 Type 2 diabetes mellitus with diabetic peripheral angiopathy without gangrene: Secondary | ICD-10-CM | POA: Diagnosis present

## 2017-08-31 DIAGNOSIS — G4730 Sleep apnea, unspecified: Secondary | ICD-10-CM | POA: Diagnosis present

## 2017-08-31 DIAGNOSIS — I739 Peripheral vascular disease, unspecified: Secondary | ICD-10-CM | POA: Diagnosis not present

## 2017-08-31 DIAGNOSIS — D126 Benign neoplasm of colon, unspecified: Secondary | ICD-10-CM | POA: Diagnosis not present

## 2017-08-31 DIAGNOSIS — Z79899 Other long term (current) drug therapy: Secondary | ICD-10-CM

## 2017-08-31 DIAGNOSIS — G4733 Obstructive sleep apnea (adult) (pediatric): Secondary | ICD-10-CM

## 2017-08-31 DIAGNOSIS — N189 Chronic kidney disease, unspecified: Secondary | ICD-10-CM | POA: Diagnosis present

## 2017-08-31 DIAGNOSIS — Z87891 Personal history of nicotine dependence: Secondary | ICD-10-CM

## 2017-08-31 DIAGNOSIS — E6609 Other obesity due to excess calories: Secondary | ICD-10-CM | POA: Diagnosis not present

## 2017-08-31 DIAGNOSIS — Z9989 Dependence on other enabling machines and devices: Secondary | ICD-10-CM

## 2017-08-31 DIAGNOSIS — E1169 Type 2 diabetes mellitus with other specified complication: Secondary | ICD-10-CM | POA: Diagnosis present

## 2017-08-31 DIAGNOSIS — I25119 Atherosclerotic heart disease of native coronary artery with unspecified angina pectoris: Secondary | ICD-10-CM | POA: Diagnosis not present

## 2017-08-31 DIAGNOSIS — J9610 Chronic respiratory failure, unspecified whether with hypoxia or hypercapnia: Secondary | ICD-10-CM | POA: Diagnosis not present

## 2017-08-31 DIAGNOSIS — I10 Essential (primary) hypertension: Secondary | ICD-10-CM | POA: Diagnosis present

## 2017-08-31 DIAGNOSIS — D122 Benign neoplasm of ascending colon: Secondary | ICD-10-CM | POA: Diagnosis not present

## 2017-08-31 DIAGNOSIS — Z7982 Long term (current) use of aspirin: Secondary | ICD-10-CM

## 2017-08-31 HISTORY — PX: LAPAROSCOPIC RIGHT COLECTOMY: SHX5925

## 2017-08-31 LAB — GLUCOSE, CAPILLARY
GLUCOSE-CAPILLARY: 192 mg/dL — AB (ref 65–99)
Glucose-Capillary: 155 mg/dL — ABNORMAL HIGH (ref 65–99)
Glucose-Capillary: 166 mg/dL — ABNORMAL HIGH (ref 65–99)
Glucose-Capillary: 200 mg/dL — ABNORMAL HIGH (ref 65–99)

## 2017-08-31 LAB — BASIC METABOLIC PANEL
Anion gap: 8 (ref 5–15)
BUN: 8 mg/dL (ref 6–20)
CHLORIDE: 108 mmol/L (ref 101–111)
CO2: 24 mmol/L (ref 22–32)
CREATININE: 0.87 mg/dL (ref 0.61–1.24)
Calcium: 7.8 mg/dL — ABNORMAL LOW (ref 8.9–10.3)
GFR calc Af Amer: >60 mL/min (ref >60)
GFR calc non Af Amer: >60 mL/min (ref >60)
Glucose, Bld: 193 mg/dL — ABNORMAL HIGH (ref 65–99)
Potassium: 3.6 mmol/L (ref 3.5–5.1)
Sodium: 140 mmol/L (ref 135–145)

## 2017-08-31 LAB — CBC
HCT: 33.3 % — ABNORMAL LOW (ref 39.0–52.0)
Hemoglobin: 10.9 g/dL — ABNORMAL LOW (ref 13.0–17.0)
MCH: 30.8 pg (ref 26.0–34.0)
MCHC: 32.7 g/dL (ref 30.0–36.0)
MCV: 94.1 fL (ref 78.0–100.0)
PLATELETS: 168 10*3/uL (ref 150–400)
RBC: 3.54 MIL/uL — ABNORMAL LOW (ref 4.22–5.81)
RDW: 14 % (ref 11.5–15.5)
WBC: 14.9 10*3/uL — ABNORMAL HIGH (ref 4.0–10.5)

## 2017-08-31 LAB — PROTIME-INR
INR: 1.2
Prothrombin Time: 15.2 seconds (ref 11.4–15.2)

## 2017-08-31 SURGERY — COLECTOMY, RIGHT, LAPAROSCOPIC
Anesthesia: General

## 2017-08-31 MED ORDER — FENTANYL CITRATE (PF) 100 MCG/2ML IJ SOLN
25.0000 ug | INTRAMUSCULAR | Status: DC | PRN
  Administered 2017-08-31 (×2): 25 ug via INTRAVENOUS

## 2017-08-31 MED ORDER — LACTATED RINGERS IV SOLN
INTRAVENOUS | Status: DC
  Administered 2017-08-31: 125 mL/h via INTRAVENOUS

## 2017-08-31 MED ORDER — FLUTICASONE PROPIONATE 50 MCG/ACT NA SUSP
1.0000 | Freq: Every day | NASAL | Status: DC
  Administered 2017-08-31 – 2017-09-04 (×5): 1 via NASAL
  Filled 2017-08-31: qty 16

## 2017-08-31 MED ORDER — IPRATROPIUM-ALBUTEROL 0.5-2.5 (3) MG/3ML IN SOLN
3.0000 mL | Freq: Four times a day (QID) | RESPIRATORY_TRACT | Status: DC
  Administered 2017-08-31: 3 mL via RESPIRATORY_TRACT
  Filled 2017-08-31 (×2): qty 3

## 2017-08-31 MED ORDER — ACETAMINOPHEN 500 MG PO TABS
1000.0000 mg | ORAL_TABLET | ORAL | Status: AC
  Administered 2017-08-31: 1000 mg via ORAL
  Filled 2017-08-31: qty 2

## 2017-08-31 MED ORDER — ONDANSETRON HCL 4 MG/2ML IJ SOLN
INTRAMUSCULAR | Status: AC
  Filled 2017-08-31: qty 2

## 2017-08-31 MED ORDER — METOCLOPRAMIDE HCL 5 MG/ML IJ SOLN
INTRAMUSCULAR | Status: AC
  Filled 2017-08-31: qty 2

## 2017-08-31 MED ORDER — BUPIVACAINE-EPINEPHRINE (PF) 0.5% -1:200000 IJ SOLN
INTRAMUSCULAR | Status: DC | PRN
  Administered 2017-08-31: 14 mL via PERINEURAL

## 2017-08-31 MED ORDER — FENTANYL CITRATE (PF) 250 MCG/5ML IJ SOLN
INTRAMUSCULAR | Status: AC
  Filled 2017-08-31: qty 5

## 2017-08-31 MED ORDER — SODIUM CHLORIDE 0.9 % IR SOLN
Status: DC | PRN
  Administered 2017-08-31 (×3): 1000 mL

## 2017-08-31 MED ORDER — ALBUMIN HUMAN 5 % IV SOLN
INTRAVENOUS | Status: DC | PRN
  Administered 2017-08-31: 10:00:00 via INTRAVENOUS

## 2017-08-31 MED ORDER — OXYCODONE HCL 5 MG/5ML PO SOLN: 5.0000 mg | Freq: Once | ORAL | Status: DC | PRN

## 2017-08-31 MED ORDER — HYDROMORPHONE HCL 1 MG/ML IJ SOLN
1.0000 mg | INTRAMUSCULAR | Status: DC | PRN
  Administered 2017-08-31 – 2017-09-01 (×3): 1 mg via INTRAVENOUS
  Filled 2017-08-31 (×3): qty 1

## 2017-08-31 MED ORDER — SUGAMMADEX SODIUM 200 MG/2ML IV SOLN
INTRAVENOUS | Status: AC
  Filled 2017-08-31: qty 2

## 2017-08-31 MED ORDER — CEFOTETAN DISODIUM-DEXTROSE 2-2.08 GM-%(50ML) IV SOLR
2.0000 g | INTRAVENOUS | Status: AC
  Administered 2017-08-31: 2 g via INTRAVENOUS
  Filled 2017-08-31: qty 50

## 2017-08-31 MED ORDER — PHENYLEPHRINE HCL 10 MG/ML IJ SOLN
INTRAVENOUS | Status: DC | PRN
  Administered 2017-08-31: 50 ug/min via INTRAVENOUS

## 2017-08-31 MED ORDER — PROPOFOL 10 MG/ML IV BOLUS
INTRAVENOUS | Status: AC
  Filled 2017-08-31: qty 20

## 2017-08-31 MED ORDER — PROPOFOL 10 MG/ML IV BOLUS
INTRAVENOUS | Status: DC | PRN
  Administered 2017-08-31: 120 mg via INTRAVENOUS

## 2017-08-31 MED ORDER — CHLORHEXIDINE GLUCONATE CLOTH 2 % EX PADS: 6.0000 | MEDICATED_PAD | Freq: Once | CUTANEOUS | Status: DC

## 2017-08-31 MED ORDER — ALVIMOPAN 12 MG PO CAPS
12.0000 mg | ORAL_CAPSULE | Freq: Two times a day (BID) | ORAL | Status: DC
  Administered 2017-09-01 (×2): 12 mg via ORAL
  Filled 2017-08-31 (×4): qty 1

## 2017-08-31 MED ORDER — METFORMIN HCL ER 500 MG PO TB24
500.0000 mg | ORAL_TABLET | Freq: Every day | ORAL | Status: DC
  Administered 2017-09-01: 500 mg via ORAL
  Filled 2017-08-31 (×2): qty 1

## 2017-08-31 MED ORDER — ONDANSETRON HCL 4 MG/2ML IJ SOLN: 4.0000 mg | Freq: Four times a day (QID) | INTRAMUSCULAR | Status: DC | PRN

## 2017-08-31 MED ORDER — GABAPENTIN 300 MG PO CAPS
ORAL_CAPSULE | ORAL | Status: AC
  Filled 2017-08-31: qty 1

## 2017-08-31 MED ORDER — LIDOCAINE HCL (CARDIAC) 20 MG/ML IV SOLN
INTRAVENOUS | Status: DC | PRN
  Administered 2017-08-31: 60 mg via INTRAVENOUS

## 2017-08-31 MED ORDER — PROSIGHT PO TABS
1.0000 | ORAL_TABLET | Freq: Every day | ORAL | Status: DC
  Administered 2017-08-31 – 2017-09-04 (×5): 1 via ORAL
  Filled 2017-08-31 (×5): qty 1

## 2017-08-31 MED ORDER — SUGAMMADEX SODIUM 200 MG/2ML IV SOLN
INTRAVENOUS | Status: DC | PRN
  Administered 2017-08-31: 200 mg via INTRAVENOUS

## 2017-08-31 MED ORDER — IPRATROPIUM-ALBUTEROL 0.5-2.5 (3) MG/3ML IN SOLN
3.0000 mL | Freq: Three times a day (TID) | RESPIRATORY_TRACT | Status: DC
  Administered 2017-09-01: 3 mL via RESPIRATORY_TRACT
  Filled 2017-08-31: qty 3

## 2017-08-31 MED ORDER — ROCURONIUM BROMIDE 10 MG/ML (PF) SYRINGE
PREFILLED_SYRINGE | INTRAVENOUS | Status: AC
  Filled 2017-08-31: qty 5

## 2017-08-31 MED ORDER — FUROSEMIDE 40 MG PO TABS
40.0000 mg | ORAL_TABLET | Freq: Every day | ORAL | Status: DC
  Administered 2017-08-31 – 2017-09-01 (×2): 40 mg via ORAL
  Filled 2017-08-31 (×3): qty 1

## 2017-08-31 MED ORDER — ATORVASTATIN CALCIUM 40 MG PO TABS
40.0000 mg | ORAL_TABLET | Freq: Every day | ORAL | Status: DC
  Administered 2017-08-31 – 2017-09-03 (×4): 40 mg via ORAL
  Filled 2017-08-31 (×4): qty 1

## 2017-08-31 MED ORDER — ZOLPIDEM TARTRATE 5 MG PO TABS
5.0000 mg | ORAL_TABLET | Freq: Every evening | ORAL | Status: DC | PRN
Start: 2017-08-31 — End: 2017-09-04
  Administered 2017-08-31 – 2017-09-02 (×3): 5 mg via ORAL
  Filled 2017-08-31 (×3): qty 1

## 2017-08-31 MED ORDER — LIDOCAINE HCL (CARDIAC) 20 MG/ML IV SOLN
INTRAVENOUS | Status: AC
  Filled 2017-08-31: qty 5

## 2017-08-31 MED ORDER — ROCURONIUM BROMIDE 100 MG/10ML IV SOLN
INTRAVENOUS | Status: DC | PRN
  Administered 2017-08-31: 60 mg via INTRAVENOUS
  Administered 2017-08-31: 30 mg via INTRAVENOUS

## 2017-08-31 MED ORDER — OXYCODONE HCL 5 MG PO TABS: 5.0000 mg | ORAL_TABLET | Freq: Once | ORAL | Status: DC | PRN

## 2017-08-31 MED ORDER — INSULIN ASPART 100 UNIT/ML ~~LOC~~ SOLN
0.0000 [IU] | SUBCUTANEOUS | Status: DC
  Administered 2017-08-31 – 2017-09-01 (×3): 4 [IU] via SUBCUTANEOUS
  Administered 2017-09-01: 3 [IU] via SUBCUTANEOUS
  Administered 2017-09-01: 4 [IU] via SUBCUTANEOUS
  Administered 2017-09-01: 3 [IU] via SUBCUTANEOUS
  Administered 2017-09-01: 4 [IU] via SUBCUTANEOUS
  Administered 2017-09-02 (×2): 3 [IU] via SUBCUTANEOUS
  Administered 2017-09-02 (×2): 4 [IU] via SUBCUTANEOUS
  Administered 2017-09-03: 3 [IU] via SUBCUTANEOUS
  Administered 2017-09-03: 4 [IU] via SUBCUTANEOUS
  Administered 2017-09-03 – 2017-09-04 (×5): 3 [IU] via SUBCUTANEOUS

## 2017-08-31 MED ORDER — OCUVITE-LUTEIN PO CAPS
1.0000 | ORAL_CAPSULE | Freq: Every day | ORAL | Status: DC
  Filled 2017-08-31: qty 1

## 2017-08-31 MED ORDER — ISOSORBIDE MONONITRATE ER 30 MG PO TB24
30.0000 mg | ORAL_TABLET | Freq: Every day | ORAL | Status: DC
  Administered 2017-08-31 – 2017-09-01 (×2): 30 mg via ORAL
  Filled 2017-08-31 (×2): qty 1

## 2017-08-31 MED ORDER — LACTATED RINGERS IV SOLN
INTRAVENOUS | Status: DC
Start: 2017-08-31 — End: 2017-08-31
  Administered 2017-08-31 (×3): via INTRAVENOUS

## 2017-08-31 MED ORDER — HYDROCODONE-ACETAMINOPHEN 5-325 MG PO TABS
1.0000 | ORAL_TABLET | ORAL | Status: DC | PRN
  Administered 2017-09-01: 2 via ORAL
  Administered 2017-09-01: 1 via ORAL
  Administered 2017-09-01: 2 via ORAL
  Filled 2017-08-31 (×2): qty 2
  Filled 2017-08-31: qty 1

## 2017-08-31 MED ORDER — GABAPENTIN 300 MG PO CAPS
300.0000 mg | ORAL_CAPSULE | Freq: Two times a day (BID) | ORAL | Status: DC
  Administered 2017-08-31 – 2017-09-04 (×8): 300 mg via ORAL
  Filled 2017-08-31 (×8): qty 1

## 2017-08-31 MED ORDER — NITROGLYCERIN 0.4 MG SL SUBL: 0.4000 mg | SUBLINGUAL_TABLET | SUBLINGUAL | Status: DC | PRN

## 2017-08-31 MED ORDER — ONDANSETRON HCL 4 MG/2ML IJ SOLN
4.0000 mg | Freq: Once | INTRAMUSCULAR | Status: AC | PRN
  Administered 2017-08-31: 4 mg via INTRAVENOUS

## 2017-08-31 MED ORDER — BUPIVACAINE-EPINEPHRINE (PF) 0.5% -1:200000 IJ SOLN
INTRAMUSCULAR | Status: AC
  Filled 2017-08-31: qty 30

## 2017-08-31 MED ORDER — ALBUTEROL SULFATE (2.5 MG/3ML) 0.083% IN NEBU: 3.0000 mL | INHALATION_SOLUTION | Freq: Four times a day (QID) | RESPIRATORY_TRACT | Status: DC | PRN

## 2017-08-31 MED ORDER — FENTANYL CITRATE (PF) 100 MCG/2ML IJ SOLN
INTRAMUSCULAR | Status: DC | PRN
  Administered 2017-08-31: 100 ug via INTRAVENOUS
  Administered 2017-08-31 (×3): 50 ug via INTRAVENOUS

## 2017-08-31 MED ORDER — PHENYLEPHRINE 40 MCG/ML (10ML) SYRINGE FOR IV PUSH (FOR BLOOD PRESSURE SUPPORT)
PREFILLED_SYRINGE | INTRAVENOUS | Status: AC
  Filled 2017-08-31: qty 10

## 2017-08-31 MED ORDER — CELECOXIB 200 MG PO CAPS
200.0000 mg | ORAL_CAPSULE | Freq: Two times a day (BID) | ORAL | Status: DC
  Administered 2017-08-31 – 2017-09-04 (×8): 200 mg via ORAL
  Filled 2017-08-31 (×8): qty 1

## 2017-08-31 MED ORDER — FENTANYL CITRATE (PF) 100 MCG/2ML IJ SOLN
INTRAMUSCULAR | Status: AC
  Filled 2017-08-31: qty 2

## 2017-08-31 MED ORDER — METOCLOPRAMIDE HCL 5 MG/ML IJ SOLN
INTRAMUSCULAR | Status: DC | PRN
  Administered 2017-08-31: 10 mg via INTRAVENOUS

## 2017-08-31 MED ORDER — LEVOTHYROXINE SODIUM 25 MCG PO TABS
125.0000 ug | ORAL_TABLET | Freq: Every day | ORAL | Status: DC
  Administered 2017-08-31 – 2017-09-04 (×5): 125 ug via ORAL
  Filled 2017-08-31 (×5): qty 1

## 2017-08-31 MED ORDER — ONDANSETRON HCL 4 MG PO TABS: 4.0000 mg | ORAL_TABLET | Freq: Four times a day (QID) | ORAL | Status: DC | PRN

## 2017-08-31 MED ORDER — ALVIMOPAN 12 MG PO CAPS
12.0000 mg | ORAL_CAPSULE | ORAL | Status: AC
  Administered 2017-08-31: 12 mg via ORAL
  Filled 2017-08-31: qty 1

## 2017-08-31 MED ORDER — GABAPENTIN 300 MG PO CAPS
300.0000 mg | ORAL_CAPSULE | ORAL | Status: AC
  Administered 2017-08-31: 300 mg via ORAL
  Filled 2017-08-31: qty 1

## 2017-08-31 MED ORDER — ONDANSETRON HCL 4 MG/2ML IJ SOLN
INTRAMUSCULAR | Status: DC | PRN
  Administered 2017-08-31: 4 mg via INTRAVENOUS

## 2017-08-31 MED ORDER — PHENYLEPHRINE HCL 10 MG/ML IJ SOLN
INTRAMUSCULAR | Status: DC | PRN
  Administered 2017-08-31 (×3): 80 ug via INTRAVENOUS

## 2017-08-31 MED ORDER — CEFOTETAN DISODIUM 2 G IJ SOLR
2.0000 g | Freq: Two times a day (BID) | INTRAMUSCULAR | Status: AC
  Administered 2017-08-31: 2 g via INTRAVENOUS
  Filled 2017-08-31: qty 2

## 2017-08-31 SURGICAL SUPPLY — 80 items
APPLIER CLIP ROT 10 11.4 M/L (STAPLE)
APR CLP MED LRG 11.4X10 (STAPLE)
BLADE CLIPPER SURG (BLADE) ×2 IMPLANT
CANISTER SUCT 3000ML PPV (MISCELLANEOUS) ×3 IMPLANT
CELLS DAT CNTRL 66122 CELL SVR (MISCELLANEOUS) ×1 IMPLANT
CHLORAPREP W/TINT 26ML (MISCELLANEOUS) ×3 IMPLANT
CLIP APPLIE ROT 10 11.4 M/L (STAPLE) IMPLANT
COVER MAYO STAND STRL (DRAPES) ×3 IMPLANT
COVER SURGICAL LIGHT HANDLE (MISCELLANEOUS) ×6 IMPLANT
DRAPE HALF SHEET 40X57 (DRAPES) ×3 IMPLANT
DRAPE LAPAROSCOPIC ABDOMINAL (DRAPES) ×3 IMPLANT
DRAPE UTILITY XL STRL (DRAPES) ×7 IMPLANT
DRAPE WARM FLUID 44X44 (DRAPE) ×3 IMPLANT
DRSG OPSITE POSTOP 4X10 (GAUZE/BANDAGES/DRESSINGS) ×2 IMPLANT
DRSG OPSITE POSTOP 4X8 (GAUZE/BANDAGES/DRESSINGS) IMPLANT
DRSG TEGADERM 2-3/8X2-3/4 SM (GAUZE/BANDAGES/DRESSINGS) ×6 IMPLANT
DRSG TELFA 3X8 NADH (GAUZE/BANDAGES/DRESSINGS) ×3 IMPLANT
ELECT BLADE 6.5 EXT (BLADE) ×2 IMPLANT
ELECT CAUTERY BLADE 6.4 (BLADE) ×6 IMPLANT
ELECT REM PT RETURN 9FT ADLT (ELECTROSURGICAL) ×3
ELECTRODE REM PT RTRN 9FT ADLT (ELECTROSURGICAL) ×1 IMPLANT
GAUZE SPONGE 2X2 8PLY STRL LF (GAUZE/BANDAGES/DRESSINGS) IMPLANT
GEL ULTRASOUND 20GR AQUASONIC (MISCELLANEOUS) IMPLANT
GLOVE BIO SURGEON STRL SZ7.5 (GLOVE) ×4 IMPLANT
GLOVE BIOGEL PI IND STRL 7.0 (GLOVE) IMPLANT
GLOVE BIOGEL PI IND STRL 8 (GLOVE) IMPLANT
GLOVE BIOGEL PI INDICATOR 7.0 (GLOVE) ×12
GLOVE BIOGEL PI INDICATOR 8 (GLOVE) ×8
GLOVE ECLIPSE 7.0 STRL STRAW (GLOVE) ×8 IMPLANT
GLOVE EUDERMIC 7 POWDERFREE (GLOVE) ×6 IMPLANT
GLOVE SURG SS PI 8.0 STRL IVOR (GLOVE) ×4 IMPLANT
GOWN STRL REUS W/ TWL LRG LVL3 (GOWN DISPOSABLE) ×6 IMPLANT
GOWN STRL REUS W/ TWL XL LVL3 (GOWN DISPOSABLE) ×2 IMPLANT
GOWN STRL REUS W/TWL LRG LVL3 (GOWN DISPOSABLE) ×18
GOWN STRL REUS W/TWL XL LVL3 (GOWN DISPOSABLE) ×12
HANDLE SUCTION POOLE (INSTRUMENTS) IMPLANT
KIT BASIN OR (CUSTOM PROCEDURE TRAY) ×3 IMPLANT
LIGASURE IMPACT 36 18CM CVD LR (INSTRUMENTS) ×2 IMPLANT
NDL INSUFFLATION 14GA 120MM (NEEDLE) IMPLANT
NEEDLE INSUFFLATION 14GA 120MM (NEEDLE) ×3 IMPLANT
NS IRRIG 1000ML POUR BTL (IV SOLUTION) ×6 IMPLANT
PAD ARMBOARD 7.5X6 YLW CONV (MISCELLANEOUS) ×6 IMPLANT
PAD DRESSING TELFA 3X8 NADH (GAUZE/BANDAGES/DRESSINGS) IMPLANT
PENCIL BUTTON HOLSTER BLD 10FT (ELECTRODE) ×6 IMPLANT
RELOAD PROXIMATE 75MM BLUE (ENDOMECHANICALS) ×6 IMPLANT
RELOAD STAPLE 75 3.8 BLU REG (ENDOMECHANICALS) IMPLANT
RETRACTOR WND ALEXIS 18 MED (MISCELLANEOUS) IMPLANT
RTRCTR WOUND ALEXIS 18CM MED (MISCELLANEOUS) ×3
SCISSORS LAP 5X35 DISP (ENDOMECHANICALS) ×2 IMPLANT
SET IRRIG TUBING LAPAROSCOPIC (IRRIGATION / IRRIGATOR) ×2 IMPLANT
SHEARS HARMONIC ACE PLUS 36CM (ENDOMECHANICALS) ×3 IMPLANT
SLEEVE ENDOPATH XCEL 5M (ENDOMECHANICALS) ×14 IMPLANT
SPECIMEN JAR LARGE (MISCELLANEOUS) ×3 IMPLANT
SPONGE GAUZE 2X2 STER 10/PKG (GAUZE/BANDAGES/DRESSINGS) ×2
SPONGE LAP 18X18 5 PK (GAUZE/BANDAGES/DRESSINGS) ×12 IMPLANT
STAPLER GUN LINEAR PROX 60 (STAPLE) ×2 IMPLANT
STAPLER PROXIMATE 75MM BLUE (STAPLE) ×2 IMPLANT
STAPLER VISISTAT 35W (STAPLE) ×3 IMPLANT
SUCTION POOLE HANDLE (INSTRUMENTS) ×3
SUCTION POOLE TIP (SUCTIONS) ×3 IMPLANT
SUT PDS AB 1 TP1 96 (SUTURE) ×6 IMPLANT
SUT SILK 2 0 SH CR/8 (SUTURE) ×3 IMPLANT
SUT SILK 2 0 TIES 10X30 (SUTURE) ×3 IMPLANT
SUT SILK 3 0 SH CR/8 (SUTURE) ×3 IMPLANT
SUT SILK 3 0 TIES 10X30 (SUTURE) ×3 IMPLANT
SYR BULB IRRIGATION 50ML (SYRINGE) ×6 IMPLANT
SYS LAPSCP GELPORT 120MM (MISCELLANEOUS)
SYSTEM LAPSCP GELPORT 120MM (MISCELLANEOUS) IMPLANT
TOWEL OR 17X26 10 PK STRL BLUE (TOWEL DISPOSABLE) ×6 IMPLANT
TRAY FOLEY W/METER SILVER 16FR (SET/KITS/TRAYS/PACK) ×3 IMPLANT
TRAY LAPAROSCOPIC MC (CUSTOM PROCEDURE TRAY) ×3 IMPLANT
TROCAR XCEL 12X100 BLDLESS (ENDOMECHANICALS) IMPLANT
TROCAR XCEL BLUNT TIP 100MML (ENDOMECHANICALS) IMPLANT
TROCAR XCEL NON-BLD 11X100MML (ENDOMECHANICALS) IMPLANT
TROCAR XCEL NON-BLD 5MMX100MML (ENDOMECHANICALS) ×3 IMPLANT
TUBE CONNECTING 12'X1/4 (SUCTIONS) ×2
TUBE CONNECTING 12X1/4 (SUCTIONS) ×4 IMPLANT
TUBING INSUFFLATION (TUBING) ×3 IMPLANT
WATER STERILE IRR 1000ML POUR (IV SOLUTION) ×3 IMPLANT
YANKAUER SUCT BULB TIP NO VENT (SUCTIONS) ×8 IMPLANT

## 2017-08-31 NOTE — Transfer of Care (Signed)
Immediate Anesthesia Transfer of Care Note  Patient: Bryan Hatfield  Procedure(s) Performed: LAPAROSCOPIC ASSISTED ASCENDING COLECTOMY (N/A )  Patient Location: PACU  Anesthesia Type:General  Level of Consciousness: awake, alert  and patient cooperative  Airway & Oxygen Therapy: Patient Spontanous Breathing and Patient connected to nasal cannula oxygen  Post-op Assessment: Report given to RN and Post -op Vital signs reviewed and stable  Post vital signs: Reviewed and stable  Last Vitals:  Vitals Value Taken Time  BP 111/61 08/31/2017 10:51 AM  Temp    Pulse 84 08/31/2017 10:51 AM  Resp 20 08/31/2017 10:51 AM  SpO2 95 % 08/31/2017 10:51 AM  Vitals shown include unvalidated device data.  Last Pain:  Vitals:   08/31/17 0656  TempSrc:   PainSc: 0-No pain         Complications: No apparent anesthesia complications

## 2017-08-31 NOTE — Op Note (Signed)
Patient Name:           Bryan Hatfield   Date of Surgery:        08/31/2017  Pre op Diagnosis:      Villous adenoma of hepatic flexure  Post op Diagnosis:    same  Procedure:                 Laparoscopic-assisted right colectomy with primary anastomosis  Surgeon:                     Edsel Petrin. Dalbert Batman, M.D., FACS  Assistant:                      Ralene Ok, M.D.   Indication for Assistant: complex technical procedure, obese patient, difficult exposure  Operative Indications:  This is an 84 year old man, referred by Dr. Laurence Spates for surgical opinion regarding management of an 8 cm villous adenoma of the hepatic flexure. Bryan Hatfield is his PCP. Bryan Hatfield is his cardiologist. He is a former patient of mine, having undergone laparoscopic cholecystectomy in 2014.     His health has been stable despite his advanced age. He is independent and drives his car. He noticed some blood in his stool. Colonoscopy showed numerous tiny polyps which were removed. There was an 8 cm mass at the hepatic flexure which was tattooed. Surface biopsies showed villous adenoma. CT scan shows perhaps some luminal narrowing of the ascending colon but no large mass or adenopathy. Liver looks normal. He has infrarenal aortic ectasia measuring 2.7 cm. Some diverticulosis noted. Dr. [name] talked to him [facility] and talked about the pros and cons of that. He also discussed surgery. The patient is more interested in surgical management. Dr. Tamala Hatfield has performed cardiac risk assessment stating that he is low to intermediate risk. cardiac clearance is completed.       Comorbidities include some coronary artery disease although he has never had an myocardial infarction. He has no chest pain or exertional dyspnea. Angioplasty 2001. Cardiac catheterization November 25, 2015 which apparently looked pretty good. BMI 36. Hypertension. COPD.  Hypothyroidism. Non-insulin-dependent diabetes. Some urinary incontinence and wears a diaper. Left knee surgery under general anesthesia in November 2018 and did well. Recent CBC and liver function tests look good.        We had a very long discussion with the patient and his wife. His performance status is reasonable and I think he is a surgical candidate. I described laparoscopic assisted right colectomy, possible open colectomy.He agrees with this plan   Operative Findings:       The tumor was present within the distal ascending colon.  It was exactly at the tattoo mark.  The tumor was much smaller than thought.  No more than 2.5 cm diameter, flat and sessile, soft.  No gross evidence of cancer in the colon or the mesentery or liver.  I had more bleeding than usual.  The tissues were very soft and friable and tore easily.  Lost anywhere from 500-700 mL of blood.  Most of this was from an arterial bleeder in the ileocolic mesentery that was found after exteriorizing the colon.   There is another mesenteric bleeder of the proximal transverse colon.  These were well controlled.  Procedure in Detail:          Following the induction of general endotracheal anesthesia a Foley catheter was placed without difficulty.  The abdomen was prepped and draped in a sterile fashion.  Intravenous antibiotics were given.  Surgical timeout was performed    Pneumoperitoneum was created with a Veress needle in the left subcostal region without difficulty.  A 5 mm trocar was then inserted and the trocar was connected to the insufflator.  Four-quadrant inspection revealed no bleeding or injury.  Ultimately placed for trocars in the midline and one trocar in the right flank.  His abdomen was very large and the omentum was quite thick.     I identified the terminal ileum and appendix and cecum.  I mobilized the terminal ileum by dividing its lateral attachments to the peritoneum.  I mobilized the appendix and cecum by  dividing its lateral peritoneal attachments.  I mobilized the entire right colon and hepatic flexure by dividing the lateral  attachments and slowly mobilizing from lateral to medial.  Identified the duodenum which was not injured.  I then repositioned the patient and mobilized the right transverse colon.  I dissected the omentum off of the transverse colon to allow  the dissection to be carriedall the way back and beyond the hepatic flexure.  I saw the tattoo mark.  I felt that I had good mobilization at this point in time took the camera out.  I made a midline incision from the umbilicus about 27-03 cm superiorly.  The fascia was incised in the midline.  Self-retaining retractor was placed.  The colon was exteriorized.  There was a little bit of tethering in the right upper quadrant.  I immediately noticed more bleeding than had been present at the laparoscopic component and extended the incision about 3 inches cephalad.  We then evacuated some blood clots.  We found an arterial bleeder in the ileocolic mesentery which was controlled with 2-0 silk suture ligatures.  I found another smaller bleeder in the transverse colon mesentery which was controlled with 2-0 silk suture ligatures.  The bleeding seemed to be controlled at this point in time.  I was able to see the tattoo mark and felt a small polyp in this location.  I transected the terminal ileum with a GI stapling device after creating a mesenteric window.  I transected the right transverse colon to the right of the middle colic vessels.  This was done using the GIA stapling device.  Mesenteric vessels were taken down either with the LigaSure device or with clamping dividing and ligating with 2-0 silk ties.     The specimen was removed and I took it to the back table and opened it up.  I found the tattoo mark an nearby was a 2.5 cm sessile soft polyp.  This was a solitary polyp.  We felt that this was the index lesion to be removed.  There were no other  masses in the colon.      After scrubbing back in we checked again for bleeding and the did not appear to be any.  The anastomosis was created between the terminal ileum and the mid transverse colon being careful not to twist the mesentery.  The anastomosis was created with a GIA stapling device.  The common defect looked pink and healthy and the common defect was closed with a TA 60 stapler.  I placed multiple silk sutures to reinforce the staple lines at critical points.  The mesentery was not manipulated any further.  We irrigated the abdomen or pelvis.  The did not seem to be any bleeding.      At  this point we changed our gloves gowns instruments and drains. On reexploration I  found a little bit of blood clot in the right upper quadrant just under the tip of the liver.  There was a little bit of oozing from the cut edges of the mesentery.  These areas were cauterized.This was observed for about 10 minutes and seemed to control the bleeding.  We found no other areas of bleeding.  The omentum was returned to its anatomic position.  The midline fascia was closed with a running suture of #1, double stated PDS.  After irrigating the wound all of the skin incisions were closed with skin staples.  I placed Telfa wicks between the staples of the midline incision and placed a honeycomb bandage.  Patient tolerated the procedure well was taken to PACU in stable condition.  EBL 500-700 mL.  Counts correct.  Complications none.     Edsel Petrin. Dalbert Batman, M.D., FACS General and Minimally Invasive Surgery Breast and Colorectal Surgery  08/31/2017 10:42 AM

## 2017-08-31 NOTE — Anesthesia Procedure Notes (Signed)
Procedure Name: Intubation Date/Time: 08/31/2017 7:58 AM Performed by: Babs Bertin, CRNA Pre-anesthesia Checklist: Patient identified, Emergency Drugs available, Suction available and Patient being monitored Patient Re-evaluated:Patient Re-evaluated prior to induction Oxygen Delivery Method: Circle System Utilized Preoxygenation: Pre-oxygenation with 100% oxygen Induction Type: IV induction Ventilation: Mask ventilation without difficulty Laryngoscope Size: Mac and 3 Grade View: Grade I Tube type: Oral Tube size: 7.5 mm Number of attempts: 1 Airway Equipment and Method: Stylet and Oral airway Placement Confirmation: ETT inserted through vocal cords under direct vision,  positive ETCO2 and breath sounds checked- equal and bilateral Secured at: 22 cm Tube secured with: Tape Dental Injury: Teeth and Oropharynx as per pre-operative assessment

## 2017-08-31 NOTE — Progress Notes (Signed)
PT Cancellation Note  Patient Details Name: Bryan Hatfield MRN: [id number] DOB: Feb 15, 1927   Cancelled Treatment:    Reason Eval/Treat Not Completed: Patient not medically ready. Pt with surgery today. Will follow up tomorrow.   [address] 08/31/2017, 1:59 PM [employer] PT [phone]

## 2017-08-31 NOTE — Progress Notes (Signed)
Patient received from PACU, A&O, VSS. Telemetry applied and CCMD notified. Oriented to room and call light placed within reach. Denies any needs at this time.

## 2017-08-31 NOTE — Anesthesia Preprocedure Evaluation (Signed)
Anesthesia Evaluation  Patient identified by MRN, date of birth, ID band Patient awake    Reviewed: Allergy & Precautions, NPO status , Patient's Chart, lab work & pertinent test results  Airway Mallampati: II  TM Distance: >3 FB Neck ROM: Full    Dental  (+) Edentulous Upper, Edentulous Lower   Pulmonary former smoker,    breath sounds clear to auscultation       Cardiovascular hypertension,  Rhythm:Regular Rate:Normal     Neuro/Psych    GI/Hepatic   Endo/Other  diabetes  Renal/GU      Musculoskeletal   Abdominal   Peds  Hematology   Anesthesia Other Findings   Reproductive/Obstetrics                             Anesthesia Physical Anesthesia Plan  ASA: III  Anesthesia Plan: General   Post-op Pain Management:    Induction: Intravenous  PONV Risk Score and Plan: Ondansetron and Dexamethasone  Airway Management Planned: Oral ETT  Additional Equipment:   Intra-op Plan:   Post-operative Plan: Extubation in OR  Informed Consent: I have reviewed the patients History and Physical, chart, labs and discussed the procedure including the risks, benefits and alternatives for the proposed anesthesia with the patient or authorized representative who has indicated his/her understanding and acceptance.       Plan Discussed with: CRNA and Anesthesiologist  Anesthesia Plan Comments:         Anesthesia Quick Evaluation  

## 2017-08-31 NOTE — Anesthesia Postprocedure Evaluation (Signed)
Anesthesia Post Note  Patient: Bryan Hatfield  Procedure(s) Performed: LAPAROSCOPIC ASSISTED ASCENDING COLECTOMY (N/A )     Patient location during evaluation: PACU Anesthesia Type: General Level of consciousness: awake and alert Pain management: pain level controlled Vital Signs Assessment: post-procedure vital signs reviewed and stable Respiratory status: spontaneous breathing, nonlabored ventilation, respiratory function stable and patient connected to nasal cannula oxygen Cardiovascular status: blood pressure returned to baseline and stable Postop Assessment: no apparent nausea or vomiting Anesthetic complications: no    Last Vitals:  Vitals:   08/31/17 1245 08/31/17 1250  BP: 111/68   Pulse: 81 81  Resp: (!) 81 (!) 23  Temp: 36.9 C   SpO2: 100% 97%    Last Pain:  Vitals:   08/31/17 1302  TempSrc:   PainSc: 4                  Saachi Zale COKER

## 2017-09-01 ENCOUNTER — Telehealth: Payer: Self-pay | Admitting: Interventional Cardiology

## 2017-09-01 ENCOUNTER — Encounter (HOSPITAL_COMMUNITY): Payer: Self-pay | Admitting: General Surgery

## 2017-09-01 ENCOUNTER — Telehealth: Payer: Self-pay | Admitting: Sports Medicine

## 2017-09-01 LAB — GLUCOSE, CAPILLARY
GLUCOSE-CAPILLARY: 143 mg/dL — AB (ref 65–99)
GLUCOSE-CAPILLARY: 110 mg/dL — AB (ref 65–99)
GLUCOSE-CAPILLARY: 162 mg/dL — AB (ref 65–99)
Glucose-Capillary: 205 mg/dL — ABNORMAL HIGH (ref 65–99)
Glucose-Capillary: 181 mg/dL — ABNORMAL HIGH (ref 65–99)
Glucose-Capillary: 156 mg/dL — ABNORMAL HIGH (ref 65–99)
Glucose-Capillary: 153 mg/dL — ABNORMAL HIGH (ref 65–99)

## 2017-09-01 LAB — BASIC METABOLIC PANEL
Anion gap: 8 (ref 5–15)
BUN: 15 mg/dL (ref 6–20)
CO2: 26 mmol/L (ref 22–32)
CREATININE: 1 mg/dL (ref 0.61–1.24)
Calcium: 8 mg/dL — ABNORMAL LOW (ref 8.9–10.3)
Chloride: 105 mmol/L (ref 101–111)
GFR calc Af Amer: >60 mL/min (ref >60)
GFR calc non Af Amer: >60 mL/min (ref >60)
GLUCOSE: 158 mg/dL — AB (ref 65–99)
Potassium: 4.1 mmol/L (ref 3.5–5.1)
SODIUM: 139 mmol/L (ref 135–145)

## 2017-09-01 LAB — CBC
HEMATOCRIT: 32.6 % — AB (ref 39.0–52.0)
Hemoglobin: 10.4 g/dL — ABNORMAL LOW (ref 13.0–17.0)
MCH: 30 pg (ref 26.0–34.0)
MCHC: 31.9 g/dL (ref 30.0–36.0)
MCV: 93.9 fL (ref 78.0–100.0)
PLATELETS: 169 10*3/uL (ref 150–400)
RBC: 3.47 MIL/uL — ABNORMAL LOW (ref 4.22–5.81)
RDW: 14.1 % (ref 11.5–15.5)
WBC: 12 10*3/uL — ABNORMAL HIGH (ref 4.0–10.5)

## 2017-09-01 MED ORDER — IPRATROPIUM-ALBUTEROL 0.5-2.5 (3) MG/3ML IN SOLN
3.0000 mL | Freq: Two times a day (BID) | RESPIRATORY_TRACT | Status: DC
  Administered 2017-09-01 – 2017-09-04 (×5): 3 mL via RESPIRATORY_TRACT
  Filled 2017-09-01 (×5): qty 3

## 2017-09-01 MED ORDER — HEPARIN SODIUM (PORCINE) 5000 UNIT/ML IJ SOLN: 5000.0000 [IU] | Freq: Three times a day (TID) | INTRAMUSCULAR | Status: DC

## 2017-09-01 NOTE — Progress Notes (Signed)
Pt is on NIV a this time tolerating it well no distress or complications noted. Pressure set per patient comfort

## 2017-09-01 NOTE — Evaluation (Signed)
Occupational Therapy Evaluation and Discharge Patient Details Name: Bryan Hatfield MRN: [id number] DOB: 1926/07/28 Today's Date: 09/01/2017    History of Present Illness Pt is an 84 y.o. male admitted 08/31/17 with a colonic mass; now s/p laparoscopic-assisted right colectomy with primary anastomosis 4/2. PMH includes HTN, CAD, DM, COPD, PVD, sleep apnea.   Clinical Impression   PTA Pt independent in mobility and ADL - drives. Pt is currently mod A for LB ADL, supervision for mobility with RW and min guard for transfers with vc for safe hand placement. Pt limited by pain at surgical incision when bending (otherwise no pain per Pt). Educated Pt on safety and adaptive equipment "My wife can do that, and I'll be bending over soon". Pt will require a 3 in 1 for safety during showering and Pt/wife provided education on all uses. He will need one to assist with bathroom transfers and safety. Education complete. Pt and wife with no further questions or concerns. OT to sign off at this time. Thank you for the opportunity to serve this patient.     Follow Up Recommendations  No OT follow up;Supervision/Assistance - 24 hour    Equipment Recommendations  3 in 1 bedside commode;Other (comment)(Pt declined AE for LB )    Recommendations for Other Services       Precautions / Restrictions Precautions Precautions: Fall Restrictions Weight Bearing Restrictions: No      Mobility Bed Mobility               General bed mobility comments: Pt sitting OOB in recliner when OT entered  Transfers Overall transfer level: Needs assistance Equipment used: Rolling walker (2 wheeled) Transfers: Sit to/from Stand Sit to Stand: Min guard         General transfer comment: cues for safe hand placement, rocking used to initiate momentum    Balance Overall balance assessment: Needs assistance   Sitting balance-Leahy Scale: Fair       Standing balance-Leahy Scale: Fair Standing balance comment: Can  static stand with no UE support; dynamic stability improved with UE support                           ADL either performed or assessed with clinical judgement   ADL Overall ADL's : Needs assistance/impaired Eating/Feeding: Independent   Grooming: Supervision/safety;Standing   Upper Body Bathing: Supervision/ safety;Sitting   Lower Body Bathing: Moderate assistance;Sitting/lateral leans   Upper Body Dressing : Modified independent   Lower Body Dressing: Moderate assistance;With caregiver independent assisting;Sit to/from stand   Toilet Transfer: Supervision/safety   Toileting- Water quality scientist and Hygiene: Min guard   Tub/ Banker: Supervision/safety;Ambulation Tub/Shower Transfer Details (indicate cue type and reason): talked about shower transfer and there is a small lip to get into shower, but no chair to sit on Functional mobility during ADLs: Supervision/safety General ADL Comments: due to incisional pain, Pt has decreased access to his LB for ADL - wife willing and able to assist with dressing initially until pain is better managed     Vision Patient Visual Report: No change from baseline Vision Assessment?: No apparent visual deficits     Perception     Praxis      Pertinent Vitals/Pain Pain Assessment: No/denies pain     Hand Dominance Left   Extremity/Trunk Assessment Upper Extremity Assessment Upper Extremity Assessment: Overall WFL for tasks assessed   Lower Extremity Assessment Lower Extremity Assessment: Overall WFL for tasks assessed  Communication Communication Communication: No difficulties   Cognition Arousal/Alertness: Awake/alert Behavior During Therapy: WFL for tasks assessed/performed Overall Cognitive Status: Within Functional Limits for tasks assessed                                     General Comments  wife present throughout session    Exercises     Shoulder Instructions      Home  Living Family/patient expects to be discharged to:: Private residence Living Arrangements: Spouse/significant other Available Help at Discharge: Family;Available 24 hours/day Type of Home: Apartment Home Access: Elevator     Home Layout: One level     Bathroom Shower/Tub: Occupational psychologist: Standard Bathroom Accessibility: Yes How Accessible: Accessible via walker Home Equipment: Walker - 2 wheels;Walker - 4 wheels;Cane - single point;Grab bars - toilet;Grab bars - tub/shower;Hand held shower head   Additional Comments: Lives with wife in retirment community (64+ years old)      Prior Functioning/Environment Level of Independence: Independent        Comments: Still drives, although wife usually drives        OT Problem List:        OT Treatment/Interventions:      OT Goals(Current goals can be found in the care plan section) Acute Rehab OT Goals Patient Stated Goal: Return home OT Goal Formulation: With patient/family Time For Goal Achievement: 09/15/17 Potential to Achieve Goals: Good  OT Frequency:     Barriers to D/C:            Co-evaluation              AM-PAC PT "6 Clicks" Daily Activity     Outcome Measure Help from another person eating meals?: None Help from another person taking care of personal grooming?: A Little Help from another person toileting, which includes using toliet, bedpan, or urinal?: A Little Help from another person bathing (including washing, rinsing, drying)?: A Little Help from another person to put on and taking off regular upper body clothing?: None Help from another person to put on and taking off regular lower body clothing?: A Lot 6 Click Score: 19   End of Session Equipment Utilized During Treatment: Gait belt;Rolling walker Nurse Communication: Mobility status;Other (comment)(requested transport from volunteer services for wife)  Activity Tolerance: Patient tolerated treatment well Patient left: in  chair;with call bell/phone within reach;with family/visitor present                   Time: 7017-7939 OT Time Calculation (min): 23 min Charges:  OT General Charges $OT Visit: 1 Visit OT Evaluation $OT Eval Moderate Complexity: 1 Mod OT Treatments $Self Care/Home Management : 8-22 mins G-Codes:     Hulda Humphrey OTR/L Max 09/01/2017, 3:52 PM

## 2017-09-01 NOTE — Evaluation (Signed)
Physical Therapy Evaluation Patient Details Name: Bryan Hatfield MRN: [id number] DOB: 09-19-1926 Today's Date: 09/01/2017   History of Present Illness  Pt is an 84 y.o. male admitted 08/31/17 with a colonic mass; now s/p laparoscopic-assisted right colectomy with primary anastomosis 4/2. PMH includes HTN, CAD, DM, COPD, PVD, sleep apnea.    Clinical Impression  Pt presents with an overall decrease in functional mobility secondary to above. PTA, pt indep and lives with wife. Today, pt able to transfer and ambulate using RW, with intermittent min guard for balance. Pt would benefit from continued acute PT services to maximize functional mobility and independence prior to d/c with HHPT services.     Follow Up Recommendations Home health PT;Supervision for mobility/OOB    Equipment Recommendations  None recommended by PT    Recommendations for Other Services       Precautions / Restrictions Precautions Precautions: Fall Restrictions Weight Bearing Restrictions: No      Mobility  Bed Mobility Overal bed mobility: Modified Independent             General bed mobility comments: Increased time and effort  Transfers Overall transfer level: Needs assistance Equipment used: Rolling walker (2 wheeled) Transfers: Sit to/from Stand Sit to Stand: Min guard         General transfer comment: Initial min guard for balance and cues for hand placement  Ambulation/Gait Ambulation/Gait assistance: Min guard;Supervision Ambulation Distance (Feet): 120 Feet Assistive device: Rolling walker (2 wheeled) Gait Pattern/deviations: Step-through pattern;Decreased stride length Gait velocity: Decreased Gait velocity interpretation: Below normal speed for age/gender General Gait Details: Min guard amb with RW, progressing to supervision; min guard for turns requiring cues for safety and proximity to RW. Pt able to correct next turn  Stairs            Wheelchair Mobility    Modified  [place] (Stroke Patients Only)       Balance Overall balance assessment: Needs assistance   Sitting balance-Leahy Scale: Fair       Standing balance-Leahy Scale: Fair Standing balance comment: Can static stand with no UE support; dynamic stability improved with UE support                             Pertinent Vitals/Pain Pain Assessment: No/denies pain    Home Living Family/patient expects to be discharged to:: Private residence Living Arrangements: Spouse/significant other Available Help at Discharge: Family;Available 24 hours/day Type of Home: Apartment Home Access: Elevator     Home Layout: One level Home Equipment: Walker - 2 wheels;Walker - 4 wheels;Cane - single point;Grab bars - toilet;Grab bars - tub/shower Additional Comments: Lives with wife in retirment community (62+ years old)    Prior Function Level of Independence: Independent         Comments: Still drives, although wife usually drives     Hand Dominance        Extremity/Trunk Assessment   Upper Extremity Assessment Upper Extremity Assessment: Overall WFL for tasks assessed    Lower Extremity Assessment Lower Extremity Assessment: Overall WFL for tasks assessed       Communication   Communication: No difficulties  Cognition Arousal/Alertness: Awake/alert Behavior During Therapy: WFL for tasks assessed/performed Overall Cognitive Status: Within Functional Limits for tasks assessed  General Comments General comments (skin integrity, edema, etc.): Wife present during session. Resting BP 109/58; post-amb BP 106/50    Exercises     Assessment/Plan    PT Assessment Patient needs continued PT services  PT Problem List Decreased activity tolerance;Decreased balance;Decreased mobility;Decreased knowledge of use of DME       PT Treatment Interventions DME instruction;Gait training;Functional mobility training;Therapeutic  activities;Therapeutic exercise;Balance training;Patient/family education    PT Goals (Current goals can be found in the Care Plan section)  Acute Rehab PT Goals Patient Stated Goal: Return home PT Goal Formulation: With patient Time For Goal Achievement: 09/15/17 Potential to Achieve Goals: Good    Frequency Min 3X/week   Barriers to discharge        Co-evaluation               AM-PAC PT "6 Clicks" Daily Activity  Outcome Measure Difficulty turning over in bed (including adjusting bedclothes, sheets and blankets)?: A Little Difficulty moving from lying on back to sitting on the side of the bed? : A Little Difficulty sitting down on and standing up from a chair with arms (e.g., wheelchair, bedside commode, etc,.)?: A Little Help needed moving to and from a bed to chair (including a wheelchair)?: A Little Help needed walking in hospital room?: A Little Help needed climbing 3-5 steps with a railing? : A Little 6 Click Score: 18    End of Session Equipment Utilized During Treatment: Gait belt Activity Tolerance: Patient tolerated treatment well Patient left: in chair;with call bell/phone within reach;with family/visitor present Nurse Communication: Mobility status PT Visit Diagnosis: Other abnormalities of gait and mobility (R26.89)    Time: 4163-8453 PT Time Calculation (min) (ACUTE ONLY): 24 min   Charges:   PT Evaluation $PT Eval Moderate Complexity: 1 Mod PT Treatments $Therapeutic Activity: 8-22 mins   PT G Codes:       Mabeline Caras, PT, DPT Acute Rehab Services  Pager: [place] 09/01/2017, 10:58 AM

## 2017-09-01 NOTE — Progress Notes (Addendum)
1 Day Post-Op  Subjective: Alert.  Says he slept well.  Pain very well controlled.  No respiratory problems.  No nausea. No arrhythmias BP 109/61, SPO2 94%, heart rate 90, NSR Urine output satisfactory  Hemoglobin stable overnight.10.9 at 11:30 AM yesterday.  10.4 at 2:45 AM today. Potassium 4.1.  Glucose 158.  Creatinine 1.00.  Objective: Vital signs in last 24 hours: Temp:  [97.2 F (36.2 C)-98.9 F (37.2 C)] 98.8 F (37.1 C) (04/03 0450) Pulse Rate:  [77-92] 90 (04/02 2223) Resp:  [12-81] 12 (04/02 2223) BP: (95-144)/(55-76) 109/61 (04/03 0450) SpO2:  [93 %-100 %] 94 % (04/02 2223) Weight:  [118.8 kg (262 lb)-120.9 kg (266 lb 8.6 oz)] 120.9 kg (266 lb 8.6 oz) (04/03 0458) Last BM Date: (Prior to arrival)  Intake/Output from previous day: 04/02 0701 - 04/03 0700 In: 2991 [P.O.:241; I.V.:2500; IV Piggyback:250] Out: 1695 [Urine:995; Blood:700] Intake/Output this shift: Total I/O In: 240 [P.O.:240] Out: 400 [Urine:400]    PE: General appearance: Alert and cooperative.  Seems oriented.  No agitation.  No air hunger.  Skin warm and dry.  CPAP  in place.  Large man Resp: clear to auscultation bilaterally GI: Obese.  Soft.  Some bowel sounds present.  Midline dressing with a little bit of old dried blood as expected.  No active bleeding. Neurologic: Grossly normal. Seems oriented 4.  Moves all 4 extremities to command.  Speech normal.  Lab Results:  Results for orders placed or performed during the hospital encounter of 08/31/17 (from the past 24 hour(s))  Glucose, capillary     Status: Abnormal   Collection Time: 08/31/17  6:29 AM  Result Value Ref Range   Glucose-Capillary 155 (H) 65 - 99 mg/dL  Glucose, capillary     Status: Abnormal   Collection Time: 08/31/17 10:52 AM  Result Value Ref Range   Glucose-Capillary 192 (H) 65 - 99 mg/dL   Comment 1 Notify RN   CBC     Status: Abnormal   Collection Time: 08/31/17 11:34 AM  Result Value Ref Range   WBC 14.9 (H) 4.0 -  10.5 K/uL   RBC 3.54 (L) 4.22 - 5.81 MIL/uL   Hemoglobin 10.9 (L) 13.0 - 17.0 g/dL   HCT 33.3 (L) 39.0 - 52.0 %   MCV 94.1 78.0 - 100.0 fL   MCH 30.8 26.0 - 34.0 pg   MCHC 32.7 30.0 - 36.0 g/dL   RDW 14.0 11.5 - 15.5 %   Platelets 168 150 - 400 K/uL  Protime-INR     Status: None   Collection Time: 08/31/17 11:34 AM  Result Value Ref Range   Prothrombin Time 15.2 11.4 - 15.2 seconds   INR 9.81   Basic metabolic panel     Status: Abnormal   Collection Time: 08/31/17 11:34 AM  Result Value Ref Range   Sodium 140 135 - 145 mmol/L   Potassium 3.6 3.5 - 5.1 mmol/L   Chloride 108 101 - 111 mmol/L   CO2 24 22 - 32 mmol/L   Glucose, Bld 193 (H) 65 - 99 mg/dL   BUN 8 6 - 20 mg/dL   Creatinine, Ser 0.87 0.61 - 1.24 mg/dL   Calcium 7.8 (L) 8.9 - 10.3 mg/dL   GFR calc non Af Amer >60 >60 mL/min   GFR calc Af Amer >60 >60 mL/min   Anion gap 8 5 - 15  Glucose, capillary     Status: Abnormal   Collection Time: 08/31/17  4:18 PM  Result Value  Ref Range   Glucose-Capillary 166 (H) 65 - 99 mg/dL   Comment 1 Notify RN   Glucose, capillary     Status: Abnormal   Collection Time: 08/31/17  8:54 PM  Result Value Ref Range   Glucose-Capillary 200 (H) 65 - 99 mg/dL  Glucose, capillary     Status: Abnormal   Collection Time: 09/01/17  1:02 AM  Result Value Ref Range   Glucose-Capillary 181 (H) 65 - 99 mg/dL  Basic metabolic panel     Status: Abnormal   Collection Time: 09/01/17  2:45 AM  Result Value Ref Range   Sodium 139 135 - 145 mmol/L   Potassium 4.1 3.5 - 5.1 mmol/L   Chloride 105 101 - 111 mmol/L   CO2 26 22 - 32 mmol/L   Glucose, Bld 158 (H) 65 - 99 mg/dL   BUN 15 6 - 20 mg/dL   Creatinine, Ser 1.00 0.61 - 1.24 mg/dL   Calcium 8.0 (L) 8.9 - 10.3 mg/dL   GFR calc non Af Amer >60 >60 mL/min   GFR calc Af Amer >60 >60 mL/min   Anion gap 8 5 - 15  CBC     Status: Abnormal   Collection Time: 09/01/17  2:45 AM  Result Value Ref Range   WBC 12.0 (H) 4.0 - 10.5 K/uL   RBC 3.47 (L)  4.22 - 5.81 MIL/uL   Hemoglobin 10.4 (L) 13.0 - 17.0 g/dL   HCT 32.6 (L) 39.0 - 52.0 %   MCV 93.9 78.0 - 100.0 fL   MCH 30.0 26.0 - 34.0 pg   MCHC 31.9 30.0 - 36.0 g/dL   RDW 14.1 11.5 - 15.5 %   Platelets 169 150 - 400 K/uL  Glucose, capillary     Status: Abnormal   Collection Time: 09/01/17  4:49 AM  Result Value Ref Range   Glucose-Capillary 143 (H) 65 - 99 mg/dL     Studies/Results: No results found.  Marland Kitchen alvimopan  12 mg Oral BID  . atorvastatin  40 mg Oral QHS  . celecoxib  200 mg Oral BID  . fluticasone  1 spray Each Nare Daily  . furosemide  40 mg Oral Daily  . gabapentin  300 mg Oral BID  . [START ON 09/02/2017] heparin injection (subcutaneous)  5,000 Units Subcutaneous Q8H  . insulin aspart  0-20 Units Subcutaneous Q4H  . ipratropium-albuterol  3 mL Nebulization TID  . isosorbide mononitrate  30 mg Oral Daily  . levothyroxine  125 mcg Oral QAC breakfast  . metFORMIN  500 mg Oral Q breakfast  . multivitamin  1 tablet Oral Daily     Assessment/Plan: s/p Procedure(s): LAPAROSCOPIC ASSISTED ASCENDING COLECTOMY   Right colon mass.  Laparoscopic assisted right colectomy 08/31/2017.  Stable [facility] OOB PT and OT consult Clear liquid diet entereg Check pathology  Acute blood loss anemia.  More bleeding than usual in OR but this appears to have stopped. Transfusion not indicated. Check labs tomorrow  VTE prophylaxis.  SCDs today.  Start subcutaneous heparin tomorrow if hemoglobin stable  Chronic urinary incontinence.  We will leave Foley today because of high risk of retention and/or incontinence until he is more mobile.  Coronary artery disease. History coronary angioplasty    No acute cardiac problems.  Continue all usual medications.  I will call Dr. Linard Millers to check on him. Sleep apnea.  CPAP  in place. BMI 36 Adult hypothyroidism, continue supplement Type 2 diabetes-SSI.  Metformin. Goal is to keep sugar between  120 and 180     @PROBHOSP @  LOS: 1  day    Adin Hector 09/01/2017  . .prob

## 2017-09-01 NOTE — Telephone Encounter (Signed)
New message    Patient is admitted to hospital, Dr Dalbert Batman would like Dr [name] to stop by if he is rounding.  Advised if patient is in hospital they would need to page through card master. Dr Dalbert Batman office wanted a note placed in chart.

## 2017-09-01 NOTE — Telephone Encounter (Signed)
Will route to Dr. [name] to make him aware but Dr. [name] is on CME today. So not in office or hospital.

## 2017-09-01 NOTE — Telephone Encounter (Signed)
Patient has been approved for [facility] injections from 08/28/2017 until 05/31/2018. Forms sent to scan. Patient is currently in the hospital. Ok to schedule when out of the hospital?  Bannon Giammarco, Beatris Ship, CMA  Ethridge Sollenberger, Beatris Ship, CMA        Submitted information to [facility]. Awaiting response.   Previous Messages    ----- Message -----  From: Silverio Decamp, MD  Sent: 08/13/2017  1:52 PM  To: Beatris Ship Jakaylah Schlafer, CMA   Left knee Orthovisc approval please, has failed NSAIDs, steroid injection, therapy, x-ray confirmed.  Please schedule when approved.  ___________________________________________  Gwen Her. Dianah Field, M.D., ABFM., CAQSM.  Primary Care and [place] In[employer] of [place] [facility]

## 2017-09-02 ENCOUNTER — Encounter (HOSPITAL_COMMUNITY): Payer: Self-pay | Admitting: Nurse Practitioner

## 2017-09-02 DIAGNOSIS — Z9989 Dependence on other enabling machines and devices: Secondary | ICD-10-CM

## 2017-09-02 DIAGNOSIS — D696 Thrombocytopenia, unspecified: Secondary | ICD-10-CM | POA: Diagnosis present

## 2017-09-02 DIAGNOSIS — E669 Obesity, unspecified: Secondary | ICD-10-CM

## 2017-09-02 DIAGNOSIS — N179 Acute kidney failure, unspecified: Secondary | ICD-10-CM | POA: Diagnosis present

## 2017-09-02 DIAGNOSIS — I739 Peripheral vascular disease, unspecified: Secondary | ICD-10-CM | POA: Diagnosis present

## 2017-09-02 DIAGNOSIS — J449 Chronic obstructive pulmonary disease, unspecified: Secondary | ICD-10-CM | POA: Diagnosis present

## 2017-09-02 DIAGNOSIS — I2510 Atherosclerotic heart disease of native coronary artery without angina pectoris: Secondary | ICD-10-CM | POA: Diagnosis present

## 2017-09-02 DIAGNOSIS — D126 Benign neoplasm of colon, unspecified: Secondary | ICD-10-CM

## 2017-09-02 DIAGNOSIS — E039 Hypothyroidism, unspecified: Secondary | ICD-10-CM | POA: Diagnosis present

## 2017-09-02 DIAGNOSIS — I10 Essential (primary) hypertension: Secondary | ICD-10-CM | POA: Diagnosis present

## 2017-09-02 DIAGNOSIS — G4733 Obstructive sleep apnea (adult) (pediatric): Secondary | ICD-10-CM

## 2017-09-02 DIAGNOSIS — Z6836 Body mass index (BMI) 36.0-36.9, adult: Secondary | ICD-10-CM

## 2017-09-02 DIAGNOSIS — Z6839 Body mass index (BMI) 39.0-39.9, adult: Secondary | ICD-10-CM

## 2017-09-02 DIAGNOSIS — E7800 Pure hypercholesterolemia, unspecified: Secondary | ICD-10-CM | POA: Diagnosis present

## 2017-09-02 DIAGNOSIS — I5032 Chronic diastolic (congestive) heart failure: Secondary | ICD-10-CM | POA: Diagnosis present

## 2017-09-02 DIAGNOSIS — E1169 Type 2 diabetes mellitus with other specified complication: Secondary | ICD-10-CM | POA: Diagnosis present

## 2017-09-02 DIAGNOSIS — D62 Acute posthemorrhagic anemia: Secondary | ICD-10-CM | POA: Diagnosis present

## 2017-09-02 LAB — BASIC METABOLIC PANEL
ANION GAP: 8 (ref 5–15)
BUN: 22 mg/dL — ABNORMAL HIGH (ref 6–20)
CO2: 25 mmol/L (ref 22–32)
Calcium: 7.7 mg/dL — ABNORMAL LOW (ref 8.9–10.3)
Chloride: 101 mmol/L (ref 101–111)
Creatinine, Ser: 1.37 mg/dL — ABNORMAL HIGH (ref 0.61–1.24)
GFR calc Af Amer: 51 mL/min — ABNORMAL LOW (ref >60)
GFR, EST NON AFRICAN AMERICAN: 44 mL/min — AB (ref >60)
GLUCOSE: 121 mg/dL — AB (ref 65–99)
POTASSIUM: 3.5 mmol/L (ref 3.5–5.1)
Sodium: 134 mmol/L — ABNORMAL LOW (ref 135–145)

## 2017-09-02 LAB — GLUCOSE, CAPILLARY
GLUCOSE-CAPILLARY: 116 mg/dL — AB (ref 65–99)
GLUCOSE-CAPILLARY: 121 mg/dL — AB (ref 65–99)
Glucose-Capillary: 128 mg/dL — ABNORMAL HIGH (ref 65–99)
Glucose-Capillary: 183 mg/dL — ABNORMAL HIGH (ref 65–99)
Glucose-Capillary: 156 mg/dL — ABNORMAL HIGH (ref 65–99)
Glucose-Capillary: 146 mg/dL — ABNORMAL HIGH (ref 65–99)

## 2017-09-02 LAB — CBC
HCT: 24.9 % — ABNORMAL LOW (ref 39.0–52.0)
HEMATOCRIT: 24.4 % — AB (ref 39.0–52.0)
HEMATOCRIT: 28.5 % — AB (ref 39.0–52.0)
HEMOGLOBIN: 8.2 g/dL — AB (ref 13.0–17.0)
HEMOGLOBIN: 9.5 g/dL — AB (ref 13.0–17.0)
Hemoglobin: 8.4 g/dL — ABNORMAL LOW (ref 13.0–17.0)
MCH: 31.2 pg (ref 26.0–34.0)
MCH: 31.3 pg (ref 26.0–34.0)
MCH: 29.7 pg (ref 26.0–34.0)
MCHC: 33.6 g/dL (ref 30.0–36.0)
MCHC: 33.7 g/dL (ref 30.0–36.0)
MCHC: 33.3 g/dL (ref 30.0–36.0)
MCV: 92.8 fL (ref 78.0–100.0)
MCV: 92.9 fL (ref 78.0–100.0)
MCV: 89.1 fL (ref 78.0–100.0)
PLATELETS: 123 10*3/uL — AB (ref 150–400)
PLATELETS: 122 10*3/uL — AB (ref 150–400)
Platelets: 131 10*3/uL — ABNORMAL LOW (ref 150–400)
RBC: 2.63 MIL/uL — AB (ref 4.22–5.81)
RBC: 2.68 MIL/uL — ABNORMAL LOW (ref 4.22–5.81)
RBC: 3.2 MIL/uL — ABNORMAL LOW (ref 4.22–5.81)
RDW: 14.6 % (ref 11.5–15.5)
RDW: 14.5 % (ref 11.5–15.5)
RDW: 15.4 % (ref 11.5–15.5)
WBC: 8.8 10*3/uL (ref 4.0–10.5)
WBC: 8 10*3/uL (ref 4.0–10.5)
WBC: 8.3 10*3/uL (ref 4.0–10.5)

## 2017-09-02 LAB — OCCULT BLOOD X 1 CARD TO LAB, STOOL: Fecal Occult Bld: NEGATIVE

## 2017-09-02 LAB — PREPARE RBC (CROSSMATCH)

## 2017-09-02 MED ORDER — SODIUM CHLORIDE 0.9% FLUSH
10.0000 mL | Freq: Two times a day (BID) | INTRAVENOUS | Status: DC
  Administered 2017-09-03: 10 mL

## 2017-09-02 MED ORDER — NON FORMULARY: 2.0000 | Freq: Every morning | Status: DC

## 2017-09-02 MED ORDER — ASPIRIN EC 81 MG PO TBEC
81.0000 mg | DELAYED_RELEASE_TABLET | Freq: Every day | ORAL | Status: DC
  Administered 2017-09-02: 81 mg via ORAL
  Filled 2017-09-02: qty 1

## 2017-09-02 MED ORDER — HYDROMORPHONE HCL 1 MG/ML IJ SOLN
0.5000 mg | INTRAMUSCULAR | Status: DC | PRN
Start: 2017-09-02 — End: 2017-09-04
  Administered 2017-09-02: 0.5 mg via INTRAVENOUS
  Filled 2017-09-02: qty 0.5

## 2017-09-02 MED ORDER — ARFORMOTEROL TARTRATE 15 MCG/2ML IN NEBU
15.0000 ug | INHALATION_SOLUTION | Freq: Two times a day (BID) | RESPIRATORY_TRACT | Status: DC
  Administered 2017-09-02 – 2017-09-04 (×4): 15 ug via RESPIRATORY_TRACT
  Filled 2017-09-02 (×4): qty 2

## 2017-09-02 MED ORDER — SODIUM CHLORIDE 0.9% FLUSH: 10.0000 mL | INTRAVENOUS | Status: DC | PRN

## 2017-09-02 MED ORDER — ISOSORBIDE MONONITRATE 10 MG PO TABS
10.0000 mg | ORAL_TABLET | Freq: Two times a day (BID) | ORAL | Status: DC
  Administered 2017-09-02 – 2017-09-03 (×4): 10 mg via ORAL
  Filled 2017-09-02 (×6): qty 1

## 2017-09-02 MED ORDER — SODIUM CHLORIDE 0.9 % IV SOLN: Freq: Once | INTRAVENOUS | Status: DC

## 2017-09-02 MED ORDER — POTASSIUM CHLORIDE 2 MEQ/ML IV SOLN
INTRAVENOUS | Status: DC
  Administered 2017-09-02: 21:00:00 via INTRAVENOUS
  Filled 2017-09-02 (×6): qty 1000

## 2017-09-02 MED ORDER — UMECLIDINIUM BROMIDE 62.5 MCG/INH IN AEPB
1.0000 | INHALATION_SPRAY | Freq: Every day | RESPIRATORY_TRACT | Status: DC
  Administered 2017-09-02 – 2017-09-04 (×3): 1 via RESPIRATORY_TRACT
  Filled 2017-09-02: qty 7

## 2017-09-02 MED ORDER — TRAMADOL HCL 50 MG PO TABS
50.0000 mg | ORAL_TABLET | Freq: Two times a day (BID) | ORAL | Status: DC | PRN
  Administered 2017-09-02: 50 mg via ORAL
  Filled 2017-09-02: qty 1

## 2017-09-02 MED ORDER — DARIFENACIN HYDROBROMIDE ER 7.5 MG PO TB24
7.5000 mg | ORAL_TABLET | Freq: Every day | ORAL | Status: DC
  Administered 2017-09-02 – 2017-09-04 (×3): 7.5 mg via ORAL
  Filled 2017-09-02 (×3): qty 1

## 2017-09-02 NOTE — Telephone Encounter (Signed)
Yes, please.

## 2017-09-02 NOTE — Progress Notes (Signed)
Pt has lost 3 IVs, all placed by the IV team.  Loss of the first site delayed the start of blood transfusions.  Both subsequent sites went bad during blood transfusions despite care monitoring and intervention.  A midline is being placed now, in effort to save second unit of blood.  Pt has tolerated all very well.  Will cont plan of care.

## 2017-09-02 NOTE — Progress Notes (Signed)
2 Days Post-Op  Subjective: Patient is alert and says he is doing well. Minimal pain.  Denies nausea.  Tolerating clear liquids. No flatus or stool yet Says he ambulated in the hall Still has Foley but urine output good  NSR.  SPO2 95%.  Heart rate 82.  BP 103/57. Hemoglobin drifted down to 8.2.  WBC 8800.  Potassium 3.5.  Glucose 121.  Creatinine has crept up to 1.37,  BUN 22.  Objective: Vital signs in last 24 hours: Temp:  [98.6 F (37 C)-98.8 F (37.1 C)] 98.7 F (37.1 C) (04/04 0416) Pulse Rate:  [79-91] 82 (04/03 2104) Resp:  [14-19] 14 (04/03 2104) BP: (92-109)/(51-58) 103/57 (04/04 0422) SpO2:  [91 %-95 %] 95 % (04/03 2058) Weight:  [121.2 kg (267 lb 3.2 oz)] 121.2 kg (267 lb 3.2 oz) (04/04 0416) Last BM Date: 08/31/17  Intake/Output from previous day: 04/03 0701 - 04/04 0700 In: 78 [P.O.:485] Out: 700 [Urine:700] Intake/Output this shift: Total I/O In: 360 [P.O.:360] Out: 700 [Urine:700]   PE: General appearance: Alert and cooperative.  Seems oriented.  No agitation.  No air hunger.  Skin warm and dry.  CPAP   removed for exam. Large man Resp: clear to auscultation bilaterally.  Questions what slight decreased breath sounds right base.  No rhonchi or wheeze.  Vital capacity this morning 2200 cc. GI: Obese.  Soft.   bowel sounds present.  Midline dressing with a little bit of old dried blood as expected.  No active bleeding. Neurologic: Grossly normal. Seems oriented 4.  Moves all 4 extremities to command.  Speech normal.     Lab Results:  Results for orders placed or performed during the hospital encounter of 08/31/17 (from the past 24 hour(s))  Glucose, capillary     Status: Abnormal   Collection Time: 09/01/17  8:01 AM  Result Value Ref Range   Glucose-Capillary 110 (H) 65 - 99 mg/dL  Glucose, capillary     Status: Abnormal   Collection Time: 09/01/17 12:04 PM  Result Value Ref Range   Glucose-Capillary 162 (H) 65 - 99 mg/dL  Glucose, capillary      Status: Abnormal   Collection Time: 09/01/17  5:05 PM  Result Value Ref Range   Glucose-Capillary 156 (H) 65 - 99 mg/dL  Glucose, capillary     Status: Abnormal   Collection Time: 09/01/17  8:23 PM  Result Value Ref Range   Glucose-Capillary 153 (H) 65 - 99 mg/dL  Glucose, capillary     Status: Abnormal   Collection Time: 09/02/17 12:40 AM  Result Value Ref Range   Glucose-Capillary 121 (H) 65 - 99 mg/dL  CBC     Status: Abnormal   Collection Time: 09/02/17  3:16 AM  Result Value Ref Range   WBC 8.8 4.0 - 10.5 K/uL   RBC 2.63 (L) 4.22 - 5.81 MIL/uL   Hemoglobin 8.2 (L) 13.0 - 17.0 g/dL   HCT 24.4 (L) 39.0 - 52.0 %   MCV 92.8 78.0 - 100.0 fL   MCH 31.2 26.0 - 34.0 pg   MCHC 33.6 30.0 - 36.0 g/dL   RDW 14.6 11.5 - 15.5 %   Platelets 123 (L) 150 - 400 K/uL  Basic metabolic panel     Status: Abnormal   Collection Time: 09/02/17  3:16 AM  Result Value Ref Range   Sodium 134 (L) 135 - 145 mmol/L   Potassium 3.5 3.5 - 5.1 mmol/L   Chloride 101 101 - 111 mmol/L   CO2  25 22 - 32 mmol/L   Glucose, Bld 121 (H) 65 - 99 mg/dL   BUN 22 (H) 6 - 20 mg/dL   Creatinine, Ser 1.37 (H) 0.61 - 1.24 mg/dL   Calcium 7.7 (L) 8.9 - 10.3 mg/dL   GFR calc non Af Amer 44 (L) >60 mL/min   GFR calc Af Amer 51 (L) >60 mL/min   Anion gap 8 5 - 15  Glucose, capillary     Status: Abnormal   Collection Time: 09/02/17  4:15 AM  Result Value Ref Range   Glucose-Capillary 128 (H) 65 - 99 mg/dL     Studies/Results: No results found.  Marland Kitchen alvimopan  12 mg Oral BID  . atorvastatin  40 mg Oral QHS  . celecoxib  200 mg Oral BID  . fluticasone  1 spray Each Nare Daily  . furosemide  40 mg Oral Daily  . gabapentin  300 mg Oral BID  . heparin injection (subcutaneous)  5,000 Units Subcutaneous Q8H  . insulin aspart  0-20 Units Subcutaneous Q4H  . ipratropium-albuterol  3 mL Nebulization BID  . isosorbide mononitrate  30 mg Oral Daily  . levothyroxine  125 mcg Oral QAC breakfast  . metFORMIN  500 mg Oral Q  breakfast  . multivitamin  1 tablet Oral Daily     Assessment/Plan: s/p Procedure(s): LAPAROSCOPIC ASSISTED ASCENDING COLECTOMY   Right colon mass.  Laparoscopic assisted right colectomy 08/31/2017.  Stable Operative findings discussed with patient in some detail. [facility] OOB PT and OT consults done-thanks [facility] to full liquid diet entereg Check pathology  Multiple medical problems.  I have called in a consult to [facility] hospitalist and spoke to [place].  We will ask them to assess his multiple medical problems(see below) and try to optimize medical issues in the perioperative period  Acute blood loss anemia.  More bleeding than usual in OR but this appears to have stopped. Hemoglobin has drifted down to 8.2.  More than likely this is re-equilibration and hemodilution Transfusion not indicated. Check labs tomorrow  Creatinine up.  Question AK I superimposed on CKD Leave Foley 1 more day due to chronic voiding problems.  Probable removed tomorrow with condom cath thereafter Continue IV fluids Labs tomorrow  VTE prophylaxis.  SCDs .  Start subcutaneous heparin   Chronic urinary incontinence.  We will leave Foley today because of high risk of retention and/or incontinence until he is more mobile.  Coronary artery disease. History coronary angioplasty    No acute cardiac problems.  Continue all usual medication.   Sleep apnea.  CPAP  in place.  SPO2 95%.  Slept well BMI 36 Adult hypothyroidism, continue supplement Type 2 diabetes-SSI.  Metformin. Goal is to keep sugar between 120 and 180     @PROBHOSP @  LOS: 2 days    Adin Hector 09/02/2017  . .prob

## 2017-09-02 NOTE — Progress Notes (Signed)
Surgery:  Due to blood in stool, I have discontinued his ASA and [place] heparin.   Edsel Petrin. Dalbert Batman, M.D., [facility] General and Minimally invasive Surgery Breast and Colorectal Surgery Office:   [phone]

## 2017-09-02 NOTE — Consult Note (Addendum)
Consultation Note  Attending MD note  Patient was seen, examined,treatment plan was discussed with the  Advance Practice Provider.  I have personally reviewed the clinical findings, lab,EKG, imaging studies and management of this patient in detail.I have also reviewed the orders written for this patient which were under my direction. I agree with the documentation, as recorded by the Advance Practice Provider, Erin Hearing NP..   84 year old male admitted to our facility for management of a 8 cm left hepatic flexure mass.  He had some acute blood loss anemia postoperatively as well as acute kidney injury.  He is currently mildly hypotensive.  We are asked to manage the patient medically.  Please see below.  Lady Deutscher, MD [facility] See Amion for pager # [facility]       JUSTUN ANAYA XNA:[id number] DOB: 1926-08-27 DOA: 08/31/2017    PCP: [name], MD   Consulting physician: Evangeline Gula  Requesting physician: Dalbert Batman  Reason for consultation: Abnormal renal function, assist with management of multiple medical problems  HPI: Bryan Hatfield is a 84 y.o. male with medical history significant for obesity and OSA on nocturnal CPAP, diabetes mellitus on metformin, chronic diastolic heart failure, CAD on medical therapy, COPD, hypertension, PVD, hypothyroidism.  Patient was admitted electively and underwent laparoscopic colonic mass resection on 08/31/17.  Pathology pending.  EBL around 700 cc.  Postoperatively patient has developed an acute kidney injury and anemia presumed related to intraoperative blood loss.  He has also developed mild thrombocytopenia likely related to consumption in the context of blood loss anemia.  Blood pressures have also been somewhat suboptimal.  Because of AKI and reported history of chronic voiding problems surgery his opted to leave Foley catheter in place.  Review of the literature, Entereg utilized in the postoperative period  can also contribute to urinary retention.   Review of Systems:  In addition to the HPI above,  No Fever-chills, myalgias or other constitutional symptoms No Headache, changes with Vision or hearing, new weakness, tingling, numbness in any extremity, dizziness, dysarthria or word finding difficulty, gait disturbance or imbalance, tremors or seizure activity No problems swallowing food or Liquids, indigestion/reflux, choking or coughing while eating, abdominal pain with or after eating No Chest pain, Cough or Shortness of Breath, palpitations, orthopnea or DOE No N/V, melena,hematochezia, dark tarry stools, constipation No dysuria, malodorous urine, hematuria or flank pain No new skin rashes, lesions, masses or bruises, No new joint pains, aches, swelling or redness No recent unintentional weight gain or loss No polyuria, polydypsia or polyphagia   Past Medical History:  Diagnosis Date  . Acute respiratory failure ([place]) 09/25/11   hypoxic  . Anxiety   . Arthritis   . Bronchitis    "seems like 2x/year"  . CAD (coronary artery disease)    a. PTCA 2001. b. f/u cath 05/2012 heavy calcification, no PCI required. c. f/u cath for recurrent sx 2017: stable mod CAD unchanged from prior, mild progression of small vessel disease, elevated LVEDP.  . Colon polyps   . COPD (chronic obstructive pulmonary disease) ([place])   . Diabetes mellitus without complication ([place])   . GERD (gastroesophageal reflux disease)   . High cholesterol   . Hyperglycemia   . Hypertension   . Hypothyroidism   . Peripheral vascular disease ([place])    patient denies knowledge of, but listed in his chart  . Pleural effusion    Parapneumonic ?r/t PNA s/p thoracentesis 2008  . Pneumonia  hx  . Sleep apnea    cpap     Past Surgical History:  Procedure Laterality Date  . CARDIAC CATHETERIZATION N/A 11/25/2015   Procedure: Left Heart Cath and Coronary Angiography;  Surgeon: Leonie Man, MD;  Location: [facility];  Service: Cardiovascular;  Laterality: N/A;  . CATARACT EXTRACTION W/ INTRAOCULAR LENS  IMPLANT, BILATERAL  ~ 2006  . CHOLECYSTECTOMY N/A 10/11/2012   Procedure: LAPAROSCOPIC CHOLECYSTECTOMY WITH INTRAOPERATIVE CHOLANGIOGRAM;  Surgeon: Adin Hector, MD;  Location: [facility];  Service: General;  Laterality: N/A;  . COLONOSCOPY    . CORONARY ANGIOPLASTY  01/2000  . HEMORRHOID SURGERY  1960's  . KNEE ARTHROSCOPY Left 04/19/2017   Procedure: ARTHROSCOPY KNEE, SUBCHONDROPLASTY;  Surgeon: Dorna Leitz, MD;  Location: [facility];  Service: Orthopedics;  Laterality: Left;  . KNEE ARTHROSCOPY WITH MEDIAL MENISECTOMY Left 04/19/2017   Procedure: KNEE ARTHROSCOPY WITH MEDIAL AND LATERAL MENISECTOMY;  Surgeon: Dorna Leitz, MD;  Location: [facility];  Service: Orthopedics;  Laterality: Left;  . LAPAROSCOPIC RIGHT COLECTOMY N/A 08/31/2017   Procedure: LAPAROSCOPIC ASSISTED ASCENDING COLECTOMY;  Surgeon: Fanny Skates, MD;  Location: [facility];  Service: General;  Laterality: N/A;    Social History   Socioeconomic History  . Marital status: Married    Spouse name: Not on file  . Number of children: Not on file  . Years of education: Not on file  . Highest education level: Not on file  Occupational History  . 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 Financial resource strain: Not on file  . Food insecurity:    Worry: Not on file    Inability: Not on file  . Transportation needs:    Medical: Not on file    Non-medical: Not on file  Tobacco Use  . Smoking status: Former Smoker    Packs/day: 1.00    Years: 10.00    Pack years: 10.00    Types: Cigarettes    Last attempt to quit: 06/01/1982    Years since quitting: 35.2  . Smokeless tobacco: Never Used  . Tobacco comment: "quit smoking ~ 25 years ago; smoked ~ 1 pk/wk"  Substance and Sexual Activity  . Alcohol use: No  . Drug use: No  . Sexual activity: Never  Lifestyle  . Physical activity:    Days per week: Not  on file    Minutes per session: Not on file  . Stress: Not on file  Relationships  . Social connections:    Talks on phone: Not on file    Gets together: Not on file    Attends religious service: Not on file    Active member of club or organization: Not on file    Attends meetings of clubs or organizations: Not on file    Relationship status: Not on file  . Intimate partner violence:    Fear of current or ex partner: Not on file    Emotionally abused: Not on file    Physically abused: Not on file    Forced sexual activity: Not on file  Other Topics Concern  . Not on file  Social History Narrative  . Not on file    Mobility: Utilizes a cane Work history: Not obtained   No Known Allergies  Family History  Problem Relation Age of Onset  . Stroke Mother   . Arthritis Mother   . Asthma Mother   . Emphysema Mother   . Stroke  Father   . Arthritis Sister   . Stroke Sister   . Heart attack Neg Hx      Prior to Admission medications   Medication Sig Start Date End Date Taking? Authorizing Provider  albuterol (PROVENTIL HFA;VENTOLIN HFA) 108 (90 Base) MCG/ACT inhaler Inhale 2 puffs every 6 (six) hours as needed into the lungs for wheezing or shortness of breath.   Yes [provider]  AMBULATORY NON FORMULARY MEDICATION Medication Name: 4 prong cane. Dx: gait instability 03/09/17  Yes [name], MD  amoxicillin-clavulanate (AUGMENTIN) 875-125 MG tablet Take 1 tablet by mouth 2 (two) times daily. 08/11/17  Yes [name], MD  Ascorbic Acid (VITAMIN C PO) Take 1 tablet by mouth daily.   Yes [provider]  aspirin EC 81 MG tablet Take 81 mg by mouth daily.     Yes [provider]  atorvastatin (LIPITOR) 40 MG tablet TAKE 1 TABLET BY MOUTH AT  BEDTIME 12/21/16  Yes [name], MD  escitalopram (LEXAPRO) 10 MG tablet TAKE 1 TABLET BY MOUTH  DAILY 03/01/17  Yes [name], MD  fluticasone [facility]) 50 MCG/ACT nasal  spray One spray in each nostril twice a day, use left hand for right nostril, and right hand for left nostril. 04/21/16  Yes Silverio Decamp, MD  furosemide (LASIX) 40 MG tablet Take 1 tablet (40 mg total) by mouth daily. 04/26/17  Yes Belva Crome, MD  isosorbide mononitrate (IMDUR) 30 MG 24 hr tablet Take 30 mg by mouth daily.   Yes [provider]  levothyroxine (SYNTHROID, LEVOTHROID) 125 MCG tablet Take 1 tablet (125 mcg total) by mouth daily before breakfast. TAKE 1 TABLET BY MOUTH  DAILY BEFORE BREAKFAST 08/17/17  Yes [name], MD  metFORMIN (GLUCOPHAGE-XR) 500 MG 24 hr tablet Take 1 tablet (500 mg total) by mouth daily with breakfast. 08/17/17  Yes [name], MD  Multiple Vitamins-Minerals (ICAPS AREDS 2) CAPS Take 1 capsule 2 (two) times daily by mouth.   Yes [provider]  Tiotropium Bromide-Olodaterol (STIOLTO RESPIMAT) 2.5-2.5 MCG/ACT AERS Inhale 2 puffs into the lungs daily. 03/09/17  Yes [name], MD  triamcinolone cream (KENALOG) 0.1 % APPLY EXTERNALLY TO THE AFFECTED AREA DAILY FOR UP TO 2 WEEKS AS NEEDED. AVOID FACE Patient taking differently: APPLY EXTERNALLY TO THE AFFECTED AREA DAILY FOR UP TO 2 WEEKS AS NEEDED FOR ITCHING . AVOID FACE 03/22/17  Yes [name], MD  VESICARE 5 MG tablet Take 5 mg by mouth daily. 06/12/16  Yes [provider]  zolpidem (AMBIEN) 10 MG tablet Take 1 tablet (10 mg total) by mouth at bedtime as needed for sleep. Ok to refill 06/01/2016 08/11/17  Yes Metheney, Rene Kocher, MD  ipratropium-albuterol (DUONEB) 0.5-2.5 (3) MG/3ML SOLN USE 1 VIAL VIA NEBULIZER EVERY 2 HOURS AS NEEDED FOR WHEEZING, SHORTNESS OF BREATH. 02/12/17   [name], MD  levothyroxine (SYNTHROID, LEVOTHROID) 125 MCG tablet TAKE 1 TABLET(125 MCG) BY MOUTH DAILY BEFORE BREAKFAST 08/30/17   [name], MD  nitroGLYCERIN (NITROSTAT) 0.4 MG SL tablet DISSOLVE 1 TABLET UNDER THE TONGUE EVERY 5  MINUTES AS  NEEDED CHEST PAIN 04/08/17   [name], MD    Physical Exam: Vitals:   09/02/17 0725 09/02/17 0730 09/02/17 0745 09/02/17 0755  BP:    (!) 116/57  Pulse: 84 81 82 84  Resp: 18 18 13 12   Temp:    98.1 F (36.7 C)  TempSrc:    Oral  SpO2: 95% 94% 91% 94%  Weight:      Height:          Constitutional: NAD, calm, comfortable Eyes: PERRL, lids and conjunctivae normal ENMT: Mucous membranes are moist. Posterior pharynx clear of any exudate or lesions.Normal dentition.  Neck: normal, supple, no masses, no thyromegaly Respiratory: clear to auscultation bilaterally, no wheezing, no crackles. Normal respiratory effort. No accessory muscle use.  Cardiovascular: Regular rate and rhythm, no murmurs / rubs / gallops. No extremity edema. 2+ pedal pulses. No carotid bruits.  Abdomen: no tenderness, no masses palpated. No hepatosplenomegaly. Bowel sounds positive.  Abdominal dressing intact. Musculoskeletal: no clubbing / cyanosis. No joint deformity upper and lower extremities. Good ROM, no contractures. Normal muscle tone.  Skin: no rashes, lesions, ulcers. No induration Neurologic: CN 2-12 grossly intact. Sensation intact, DTR normal. Strength 5/5 x all 4 extremities.  Psychiatric: Normal judgment and insight. Alert and oriented x 3. Normal mood.    Labs on Admission: I have personally reviewed following labs and imaging studies  CBC: Recent Labs  Lab 08/31/17 1134 09/01/17 0245 09/02/17 0316  WBC 14.9* 12.0* 8.8  HGB 10.9* 10.4* 8.2*  HCT 33.3* 32.6* 24.4*  MCV 94.1 93.9 92.8  PLT 168 169 106*   Basic Metabolic Panel: Recent Labs  Lab 08/31/17 1134 09/01/17 0245 09/02/17 0316  NA 140 139 134*  K 3.6 4.1 3.5  CL 108 105 101  CO2 24 26 25   GLUCOSE 193* 158* 121*  BUN 8 15 22*  CREATININE 0.87 1.00 1.37*  CALCIUM 7.8* 8.0* 7.7*   GFR: Estimated Creatinine Clearance: 48.4 mL/min (A) (by C-G formula based on SCr of 1.37 mg/dL (H)). Liver Function  Tests: No results for input(s): AST, ALT, ALKPHOS, BILITOT, PROT, ALBUMIN in the last 168 hours. No results for input(s): LIPASE, AMYLASE in the last 168 hours. No results for input(s): AMMONIA in the last 168 hours. Coagulation Profile: Recent Labs  Lab 08/31/17 1134  INR 1.20   Cardiac Enzymes: No results for input(s): CKTOTAL, CKMB, CKMBINDEX, TROPONINI in the last 168 hours. BNP (last 3 results) No results for input(s): PROBNP in the last 8760 hours. HbA1C: No results for input(s): HGBA1C in the last 72 hours. CBG: Recent Labs  Lab 09/01/17 1705 09/01/17 2023 09/02/17 0040 09/02/17 0415 09/02/17 0753  GLUCAP 156* 153* 121* 128* 116*   Lipid Profile: No results for input(s): CHOL, HDL, LDLCALC, TRIG, CHOLHDL, LDLDIRECT in the last 72 hours. Thyroid Function Tests: No results for input(s): TSH, T4TOTAL, FREET4, T3FREE, THYROIDAB in the last 72 hours. Anemia Panel: No results for input(s): VITAMINB12, FOLATE, FERRITIN, TIBC, IRON, RETICCTPCT in the last 72 hours. Urine analysis:    Component Value Date/Time   COLORURINE YELLOW 09/23/2013 1304   APPEARANCEUR CLEAR 09/23/2013 1304   LABSPEC 1.020 09/23/2013 1304   PHURINE 7.5 09/23/2013 1304   GLUCOSEU NEGATIVE 09/23/2013 1304   HGBUR NEGATIVE 09/23/2013 1304   BILIRUBINUR neg 08/30/2014 1649   KETONESUR NEGATIVE 09/23/2013 1304   PROTEINUR neg 08/30/2014 1649   PROTEINUR NEGATIVE 09/23/2013 1304   UROBILINOGEN 0.2 08/30/2014 1649   UROBILINOGEN 1.0 09/23/2013 1304   NITRITE neg 08/30/2014 1649   NITRITE NEGATIVE 09/23/2013 1304   LEUKOCYTESUR Negative 08/30/2014 1649   Sepsis Labs: @LABRCNTIP (procalcitonin:4,lacticidven:4) )No results found for this or any previous visit (from the past 240 hour(s)).   Radiological Exams on Admission: No results found.   Assessment/Plan Principal Problem:   Tubulovillous adenoma of large intestine/8  cm hepatic flexure mass status post laparoscopic assisted  colectomy -Management per surgical team-pathology pending  Active Problems:   Acute kidney injury  -Preoperative creatinine 0.83 with current creatinine 1.37 -Hold metformin and Lasix -Encourage oral intake -Optimize blood pressure (see below) -Follow labs -Encourage discontinuation of Foley catheter within the next 24 hours-given that worsening of renal function occurred with catheter in place less likely that urinary retention/hydronephrosis has contributed to renal dysfunction -Avoid nephrotoxic medications such as NSAIDs -Resume preadmission Vesicare to help prevent urinary retention    Acute blood loss anemia/Thrombocytopenia  -Reported 700 cc EBL intraoperatively -Preoperative hemoglobin between 12.5 and 13.5 with current hemoglobin 8.2 -Transfuse 2 units PRBCs with CBC posttransfusion -Suspect thrombocytopenia is related to consumption in context of acute anemia -Follow labs -Subcutaneous heparin for DVT prophylaxis to be initiated with first dose today-if platelets drop below 100,000 will need to stop heparin and resume SCDs    CAD (coronary artery disease) -Currently asymptomatic without chest pain -Long-acting Imdur 30 mg daily has been transitioned to isosorbide mononitrate 10 mg twice daily in the context of suboptimal blood pressure readings and desire to maximize renal perfusion -Continue statin -Resume baby aspirin -Not on beta-blocker preoperatively    COPD (chronic obstructive pulmonary disease)  -Stable without wheezing -Continue preadmission Ventolin MDI-resume Stiolto MDI (will likely need to have family bring medicine from home since is nonformulary)    Diabetes mellitus type 2 in obese  -CBGs averaging between 110 and 181 in the postoperative period -Metformin on hold in context of AKI -Utilize SSI -HgbA1c was 6.4 on 08/25/17    Chronic diastolic heart failure  -Diagnosed by cardiac catheterization in 2017 -Holding Lasix as above -Daily weights, strict  I's/O    HTN (hypertension) -Nitrate dosage adjusted as above with Lasix on hold    Peripheral vascular disease  -Asymptomatic    Hypothyroidism -Continue Synthroid    High cholesterol -Continue Lipitor    Class 2 obesity with body mass index (BMI) of 36.0 to 36.9 in adult/OSA on CPAP -Continue nocturnal CPAP    **Additional lab, imaging and/or diagnostic evaluation at discretion of supervising physician  DVT prophylaxis: Subcu heparin initiated 09/02/17 Code Status: Full Family Communication: No family at bedside Disposition Plan: N[place]. ANP-BC [facility] Pager [phone]   If 7PM-7AM, please contact night-coverage [email or web address] Password [facility]  09/02/2017, 8:35 AM

## 2017-09-02 NOTE — Progress Notes (Signed)
PT Cancellation Note  Patient Details Name: Bryan Hatfield MRN: [id number] DOB: [birth year]   Cancelled Treatment:    Reason Eval/Treat Not Completed: Medical issues which prohibited therapy(Pt having difficulties receiving blood transfusion today.  Will defer at this time.)   Terri Skains 09/02/2017, 4:31 PM  Terri Skains, [place]

## 2017-09-02 NOTE — Progress Notes (Signed)
PT Cancellation Note  Patient Details Name: Bryan Hatfield MRN: [id number] DOB: 01/22/1927   Cancelled Treatment:    Reason Eval/Treat Not Completed: (P) Patient not medically ready;Medical issues which prohibited therapy(Pt with drop in HGb from 10.4-8.2 overnight.  Per RN patient with large bloody stool and awaiting blood transfusion. At this time patient is not appropriate as he is symptomatic to decrease in HGB at this time.  Will f/u per POC as medically appropriate.)   Bryan Hatfield [name] 09/02/2017, 10:20 AM Governor Rooks, PTA pager [phone]

## 2017-09-03 DIAGNOSIS — I2510 Atherosclerotic heart disease of native coronary artery without angina pectoris: Secondary | ICD-10-CM

## 2017-09-03 DIAGNOSIS — E7800 Pure hypercholesterolemia, unspecified: Secondary | ICD-10-CM

## 2017-09-03 DIAGNOSIS — D62 Acute posthemorrhagic anemia: Secondary | ICD-10-CM

## 2017-09-03 DIAGNOSIS — Z6836 Body mass index (BMI) 36.0-36.9, adult: Secondary | ICD-10-CM

## 2017-09-03 DIAGNOSIS — G4733 Obstructive sleep apnea (adult) (pediatric): Secondary | ICD-10-CM

## 2017-09-03 DIAGNOSIS — Z9989 Dependence on other enabling machines and devices: Secondary | ICD-10-CM

## 2017-09-03 DIAGNOSIS — E669 Obesity, unspecified: Secondary | ICD-10-CM

## 2017-09-03 DIAGNOSIS — E1169 Type 2 diabetes mellitus with other specified complication: Secondary | ICD-10-CM

## 2017-09-03 DIAGNOSIS — N179 Acute kidney failure, unspecified: Secondary | ICD-10-CM

## 2017-09-03 DIAGNOSIS — I739 Peripheral vascular disease, unspecified: Secondary | ICD-10-CM

## 2017-09-03 DIAGNOSIS — J449 Chronic obstructive pulmonary disease, unspecified: Secondary | ICD-10-CM

## 2017-09-03 DIAGNOSIS — D696 Thrombocytopenia, unspecified: Secondary | ICD-10-CM

## 2017-09-03 DIAGNOSIS — E039 Hypothyroidism, unspecified: Secondary | ICD-10-CM

## 2017-09-03 DIAGNOSIS — I5032 Chronic diastolic (congestive) heart failure: Secondary | ICD-10-CM

## 2017-09-03 DIAGNOSIS — E6609 Other obesity due to excess calories: Secondary | ICD-10-CM

## 2017-09-03 LAB — TYPE AND SCREEN
ABO/RH(D): O NEG
Antibody Screen: NEGATIVE
UNIT DIVISION: 0
Unit division: 0

## 2017-09-03 LAB — BASIC METABOLIC PANEL
ANION GAP: 9 (ref 5–15)
BUN: 15 mg/dL (ref 6–20)
CALCIUM: 8.2 mg/dL — AB (ref 8.9–10.3)
CO2: 26 mmol/L (ref 22–32)
CREATININE: 0.86 mg/dL (ref 0.61–1.24)
Chloride: 104 mmol/L (ref 101–111)
GFR calc Af Amer: >60 mL/min (ref >60)
Glucose, Bld: 123 mg/dL — ABNORMAL HIGH (ref 65–99)
Potassium: 3.8 mmol/L (ref 3.5–5.1)
Sodium: 139 mmol/L (ref 135–145)

## 2017-09-03 LAB — CBC
HCT: 28.6 % — ABNORMAL LOW (ref 39.0–52.0)
Hemoglobin: 9.3 g/dL — ABNORMAL LOW (ref 13.0–17.0)
MCH: 29.1 pg (ref 26.0–34.0)
MCHC: 32.5 g/dL (ref 30.0–36.0)
MCV: 89.4 fL (ref 78.0–100.0)
PLATELETS: 129 10*3/uL — AB (ref 150–400)
RBC: 3.2 MIL/uL — ABNORMAL LOW (ref 4.22–5.81)
RDW: 15.8 % — AB (ref 11.5–15.5)
WBC: 7.8 10*3/uL (ref 4.0–10.5)

## 2017-09-03 LAB — GLUCOSE, CAPILLARY
GLUCOSE-CAPILLARY: 139 mg/dL — AB (ref 65–99)
Glucose-Capillary: 107 mg/dL — ABNORMAL HIGH (ref 65–99)
Glucose-Capillary: 111 mg/dL — ABNORMAL HIGH (ref 65–99)
Glucose-Capillary: 126 mg/dL — ABNORMAL HIGH (ref 65–99)
Glucose-Capillary: 127 mg/dL — ABNORMAL HIGH (ref 65–99)
Glucose-Capillary: 141 mg/dL — ABNORMAL HIGH (ref 65–99)
Glucose-Capillary: 169 mg/dL — ABNORMAL HIGH (ref 65–99)

## 2017-09-03 LAB — BPAM RBC
BLOOD PRODUCT EXPIRATION DATE: 201904182359
Blood Product Expiration Date: 201904162359
ISSUE DATE / TIME: 201904041311
ISSUE DATE / TIME: 201904041632
UNIT TYPE AND RH: 9500
Unit Type and Rh: 9500

## 2017-09-03 NOTE — Progress Notes (Signed)
PROGRESS NOTE    Bryan Hatfield   XLK:[id number]  DOB: 07/05/1926  DOA: 08/31/2017 PCP: [name], MD   Brief Narrative:  Bryan Hatfield is a 84 y.o. male with obesity and OSA on nocturnal CPAP, diabetes mellitus on metformin, chronic diastolic heart failure, CAD on medical therapy, COPD, hypertension, PVD, hypothyroidism.  Patient was admitted electively and underwent laparoscopic colonic mass resection on 08/31/17.   EBL around 700 cc.  Postoperatively patient has developed an acute kidney injury and anemia presumed related to intraoperative blood loss.  He has also developed mild thrombocytopenia likely related to consumption in the context of blood loss anemia.    Because of AKI and reported history of chronic voiding problems surgery his opted to leave Foley catheter in place.   Subjective: Feels well today. Eating a banana and sitting up in a chair. He has no complaints.  ROS: no complaints of nausea, vomiting, constipation diarrhea, cough, dyspnea   Assessment & Plan:   Principal Problem:   Tubulovillous adenoma of large intestine - primary team is managing  Active Problems:  AKI/ acute blood loss anemia/ h/o chronic d CHF - Cr 0.83 >> 1.37- AKI due to blood loss in setting of Lasix use - 4/4- Lasix and Metformin were held,  Vesicare resumed, 2 U PRBC given - Cr today is 0.86 - will stop continuous IVF and encourage Oral hydration- he is eating well and should be able to drink adequately  Acute thrombocytopenia - ? Consumption due to bleeding- follow- heparin on hold per surgery for above bleeding- I have reminded him to keep SCDs on while in bed  CAD- H/o HTN with post op hypotension - 4/4- Imdur dose dropped from 30 mg daily to  isosorbide mononitrate 10 mg twice a day to allow recovery of BP and renal function  DM2 - Metformin on hold- cont SSI- sugars controlled --HgbA1c was 6.4 on 08/25/17  COPD -  cont Inhalers and Nebs- no wheezing   Class 2 obesity with body mass index (BMI) of 36.0 to 36.9 in adult/OSA on CPAP -Continue nocturnal CPAP    DVT prophylaxis: SCDs Code Status: Full code Family Communication: wife Disposition Plan: per primary team    Antimicrobials:  Anti-infectives (From admission, onward)   Start     Dose/Rate Route Frequency Ordered Stop   08/31/17 2000  cefoTEtan (CEFOTAN) 2 g in sodium chloride 0.9 % 100 mL IVPB     2 g 200 mL/hr over 30 Minutes Intravenous Every 12 hours 08/31/17 1300 08/31/17 2127   08/31/17 0620  cefoTEtan in Dextrose 5% (CEFOTAN) IVPB 2 g     2 g Intravenous On call to O.R. 08/31/17 0620 08/31/17 0800       Objective: Vitals:   09/03/17 0855 09/03/17 0901 09/03/17 1312 09/03/17 1315  BP: 123/65  (!) 144/65 138/62  Pulse: 82   88  Resp:   (!) 24 (!) 23  Temp: 98 F (36.7 C)  97.6 F (36.4 C) 98 F (36.7 C)  TempSrc: Oral  Oral Oral  SpO2:  97% 97% 99%  Weight:      Height:        Intake/Output Summary (Last 24 hours) at 09/03/2017 1418 Last data filed at 09/03/2017 1300 Gross per 24 hour  Intake 2360 ml  Output 4325 ml  Net -1965 ml   Filed Weights   09/01/17 0458 09/02/17 0416 09/03/17 0617  Weight: 120.9 kg (266 lb 8.6 oz) 121.2 kg (267 lb  3.2 oz) 120.3 kg (265 lb 4.8 oz)    Examination: General exam: Appears comfortable  HEENT: PERRLA, oral mucosa moist, no sclera icterus or thrush Respiratory system: Clear to auscultation. Respiratory effort normal. Cardiovascular system: S1 & S2 heard, RRR.   Gastrointestinal system: Abdomen soft, dressing no opened, BS+ Central nervous system: Alert and oriented. No focal neurological deficits. Extremities: No cyanosis, clubbing or edema Skin: No rashes or ulcers Psychiatry:  Mood & affect appropriate.     Data Reviewed: I have personally reviewed following labs and imaging studies  CBC: Recent Labs  Lab 09/01/17 0245 09/02/17 0316 09/02/17 1112 09/02/17 2059  09/03/17 0330  WBC 12.0* 8.8 8.0 8.3 7.8  HGB 10.4* 8.2* 8.4* 9.5* 9.3*  HCT 32.6* 24.4* 24.9* 28.5* 28.6*  MCV 93.9 92.8 92.9 89.1 89.4  PLT 169 123* 122* 131* 509*   Basic Metabolic Panel: Recent Labs  Lab 08/31/17 1134 09/01/17 0245 09/02/17 0316 09/03/17 0330  NA 140 139 134* 139  K 3.6 4.1 3.5 3.8  CL 108 105 101 104  CO2 24 26 25 26   GLUCOSE 193* 158* 121* 123*  BUN 8 15 22* 15  CREATININE 0.87 1.00 1.37* 0.86  CALCIUM 7.8* 8.0* 7.7* 8.2*   GFR: Estimated Creatinine Clearance: 76.8 mL/min (by C-G formula based on SCr of 0.86 mg/dL). Liver Function Tests: No results for input(s): AST, ALT, ALKPHOS, BILITOT, PROT, ALBUMIN in the last 168 hours. No results for input(s): LIPASE, AMYLASE in the last 168 hours. No results for input(s): AMMONIA in the last 168 hours. Coagulation Profile: Recent Labs  Lab 08/31/17 1134  INR 1.20   Cardiac Enzymes: No results for input(s): CKTOTAL, CKMB, CKMBINDEX, TROPONINI in the last 168 hours. BNP (last 3 results) No results for input(s): PROBNP in the last 8760 hours. HbA1C: No results for input(s): HGBA1C in the last 72 hours. CBG: Recent Labs  Lab 09/02/17 2008 09/02/17 2356 09/03/17 0424 09/03/17 0810 09/03/17 1116  GLUCAP 146* 107* 126* 127* 141*   Lipid Profile: No results for input(s): CHOL, HDL, LDLCALC, TRIG, CHOLHDL, LDLDIRECT in the last 72 hours. Thyroid Function Tests: No results for input(s): TSH, T4TOTAL, FREET4, T3FREE, THYROIDAB in the last 72 hours. Anemia Panel: No results for input(s): VITAMINB12, FOLATE, FERRITIN, TIBC, IRON, RETICCTPCT in the last 72 hours. Urine analysis:    Component Value Date/Time   COLORURINE YELLOW 09/23/2013 1304   APPEARANCEUR CLEAR 09/23/2013 1304   LABSPEC 1.020 09/23/2013 1304   PHURINE 7.5 09/23/2013 1304   GLUCOSEU NEGATIVE 09/23/2013 1304   HGBUR NEGATIVE 09/23/2013 1304   BILIRUBINUR neg 08/30/2014 1649   KETONESUR NEGATIVE 09/23/2013 1304   PROTEINUR neg  08/30/2014 1649   PROTEINUR NEGATIVE 09/23/2013 1304   UROBILINOGEN 0.2 08/30/2014 1649   UROBILINOGEN 1.0 09/23/2013 1304   NITRITE neg 08/30/2014 1649   NITRITE NEGATIVE 09/23/2013 1304   LEUKOCYTESUR Negative 08/30/2014 1649   Sepsis Labs: @LABRCNTIP (procalcitonin:4,lacticidven:4) )No results found for this or any previous visit (from the past 240 hour(s)).       Radiology Studies: No results found.    Scheduled Meds: . arformoterol  15 mcg Nebulization BID   And  . umeclidinium bromide  1 puff Inhalation Daily  . atorvastatin  40 mg Oral QHS  . celecoxib  200 mg Oral BID  . darifenacin  7.5 mg Oral Daily  . fluticasone  1 spray Each Nare Daily  . gabapentin  300 mg Oral BID  . insulin aspart  0-20 Units Subcutaneous Q4H  .  ipratropium-albuterol  3 mL Nebulization BID  . isosorbide mononitrate  10 mg Oral BID  . levothyroxine  125 mcg Oral QAC breakfast  . multivitamin  1 tablet Oral Daily  . sodium chloride flush  10-40 mL Intracatheter Q12H   Continuous Infusions: . sodium chloride       LOS: 3 days    Time spent in minutes: 35    Debbe Odea, MD [facility] Pager: [email or web address] Password [email or web address] 09/03/2017, 2:18 PM

## 2017-09-03 NOTE — Progress Notes (Signed)
Physical Therapy Treatment Patient Details Name: Bryan Hatfield MRN: [id number] DOB: 11-20-1926 Today's Date: 09/03/2017    History of Present Illness Pt is an 84 y.o. male admitted 08/31/17 with a colonic mass; now s/p laparoscopic-assisted right colectomy with primary anastomosis 4/2. PMH includes HTN, CAD, DM, COPD, PVD, sleep apnea.    PT Comments    Pt performed increased gait and functional mobility from previous session.  Upon arrival patient ambulating in halls with RN and PTA took over for duration of gait.  Pt remains to require cues for posture and safety.  No over LOB noted and remains appropriate to return home at d/c.  Pt with BM during session and performed therapeutic exercises after toileting.   HHPT PT remains for follow up as d/c to improve strength and function during recovery.   Follow Up Recommendations  Home health PT;Supervision for mobility/OOB     Equipment Recommendations  None recommended by PT    Recommendations for Other Services       Precautions / Restrictions Precautions Precautions: Fall Restrictions Weight Bearing Restrictions: No    Mobility  Bed Mobility               General bed mobility comments: Pt ambulating in halls on arrival with RN.    Transfers Overall transfer level: Needs assistance Equipment used: Rolling walker (2 wheeled) Transfers: Sit to/from Stand Sit to Stand: Min guard         General transfer comment: Cues for safe hand placement, reaching back from surface.  Post return to recliner patient reports needing to have BM and performed stand pivot from chair to [facility].  Pt in a rrush to toilet and required cues for safety.    Ambulation/Gait Ambulation/Gait assistance: Supervision Ambulation Distance (Feet): 200 Feet Assistive device: Rolling walker (2 wheeled) Gait Pattern/deviations: Step-through pattern;Decreased stride length Gait velocity: Decreased Gait velocity interpretation: Below normal speed for  age/gender General Gait Details: Cues for forward gaze and upper trunk control to achieve improve posture.     Stairs            Wheelchair Mobility    Modified Rankin (Stroke Patients Only)       Balance Overall balance assessment: Needs assistance   Sitting balance-Leahy Scale: Fair       Standing balance-Leahy Scale: Fair Standing balance comment: Can static stand with no UE support; dynamic stability improved with UE support                            Cognition Arousal/Alertness: Awake/alert Behavior During Therapy: WFL for tasks assessed/performed Overall Cognitive Status: Within Functional Limits for tasks assessed                                        Exercises General Exercises - Lower Extremity Long Arc Quad: AROM;Both;10 reps;Seated Hip ABduction/ADduction: AROM;Both;10 reps;Seated Hip Flexion/Marching: AROM;Both;10 reps;Seated Heel Raises: AROM;Both;10 reps;Standing    General Comments        Pertinent Vitals/Pain Pain Assessment: 0-10 Pain Score: 5  Pain Location: R lower quadrant of abdomen.   Pain Descriptors / Indicators: Sore Pain Intervention(s): Monitored during session;Repositioned    Home Living                      Prior Function  PT Goals (current goals can now be found in the care plan section) Acute Rehab PT Goals Patient Stated Goal: Return home Potential to Achieve Goals: Good Progress towards PT goals: Progressing toward goals    Frequency    Min 3X/week      PT Plan Current plan remains appropriate    Co-evaluation              AM-PAC PT "6 Clicks" Daily Activity  Outcome Measure  Difficulty turning over in bed (including adjusting bedclothes, sheets and blankets)?: A Little Difficulty moving from lying on back to sitting on the side of the bed? : A Little Difficulty sitting down on and standing up from a chair with arms (e.g., wheelchair, bedside  commode, etc,.)?: Unable Help needed moving to and from a bed to chair (including a wheelchair)?: A Little Help needed walking in hospital room?: A Little Help needed climbing 3-5 steps with a railing? : A Little 6 Click Score: 16    End of Session Equipment Utilized During Treatment: Gait belt Activity Tolerance: Patient tolerated treatment well Patient left: in chair;with call bell/phone within reach;with family/visitor present Nurse Communication: Mobility status PT Visit Diagnosis: Other abnormalities of gait and mobility (R26.89)     Time: 9163-8466 PT Time Calculation (min) (ACUTE ONLY): 19 min  Charges:  $Gait Training: 8-22 mins                    G Codes:       Bryan Hatfield, PTA pager [place] 09/03/2017, 3:48 PM

## 2017-09-03 NOTE — Progress Notes (Addendum)
3 Days Post-Op  Subjective: This morning he is alert and stable and I found him ambulating with a walker with the nursing staff.  The first words out of his mouth were: "what did my pathology report show" Denies pain, nausea, or respiratory problems  He had one bloody bowel movement yesterday and then a second bowel movement that was brown. He received 2 units PRBC yesterday. Hemoglobin up from 8.2 to 9.3.  WBC 7800. Creatinine has come back to normal at 0.86.  Glucose 123.  Potassium 3.8.  Pathology showed that he actually had 3 tubular adenomas, 3.5 cm or less in size.  No cancer.  All lymph nodes negative.  I discussed this with him which is obviously good news.  Appreciate management advice from [facility] hospitalist.  Objective: Vital signs in last 24 hours: Temp:  [96.6 F (35.9 C)-99 F (37.2 C)] 96.6 F (35.9 C) (04/04 2358) Pulse Rate:  [81-102] 84 (04/04 2358) Resp:  [12-23] 19 (04/05 0617) BP: (113-123)/(54-75) 116/59 (04/04 2358) SpO2:  [91 %-98 %] 95 % (04/04 2358) Weight:  [120.3 kg (265 lb 4.8 oz)] 120.3 kg (265 lb 4.8 oz) (04/05 0617) Last BM Date: 09/02/17  Intake/Output from previous day: 04/04 0701 - 04/05 0700 In: 2630 [P.O.:2240; Blood:390] Out: 4376 [Urine:4375; Stool:1] Intake/Output this shift: Total I/O In: 120 [P.O.:120] Out: 825 [Urine:825]    PE: General appearance:Alert and cooperative.  Up with walker. Seems oriented. No agitation. No air hunger. Skin warm and dry. Large man Resp:clear to auscultation bilaterally.    No rhonchi or wheeze.  Vital capacity this morning 2300 cc. VP:XTGGY. Soft.  bowel sounds present.  Dressing removed and wax removed.  No active bleeding from wound.  Redressed. No active bleeding. Neurologic:Grossly normal. Seems oriented 4. Moves all 4 extremities to command. Speech normal.       Lab Results:  Results for orders placed or performed during the hospital encounter of 08/31/17 (from the past 24  hour(s))  Glucose, capillary     Status: Abnormal   Collection Time: 09/02/17  7:53 AM  Result Value Ref Range   Glucose-Capillary 116 (H) 65 - 99 mg/dL   Comment 1 Notify RN    Comment 2 Document in Chart   Prepare RBC     Status: None   Collection Time: 09/02/17  7:57 AM  Result Value Ref Range   Order Confirmation      ORDER PROCESSED BY BLOOD BANK Performed at [facility], [place] [address], [place], [place] [address]   Occult blood card to lab, stool RN will collect     Status: None   Collection Time: 09/02/17  9:08 AM  Result Value Ref Range   Fecal Occult Bld NEGATIVE NEGATIVE  Glucose, capillary     Status: Abnormal   Collection Time: 09/02/17 11:02 AM  Result Value Ref Range   Glucose-Capillary 183 (H) 65 - 99 mg/dL  CBC     Status: Abnormal   Collection Time: 09/02/17 11:12 AM  Result Value Ref Range   WBC 8.0 4.0 - 10.5 K/uL   RBC 2.68 (L) 4.22 - 5.81 MIL/uL   Hemoglobin 8.4 (L) 13.0 - 17.0 g/dL   HCT 24.9 (L) 39.0 - 52.0 %   MCV 92.9 78.0 - 100.0 fL   MCH 31.3 26.0 - 34.0 pg   MCHC 33.7 30.0 - 36.0 g/dL   RDW 14.5 11.5 - 15.5 %   Platelets 122 (L) 150 - 400 K/uL  Glucose, capillary  Status: Abnormal   Collection Time: 09/02/17  4:12 PM  Result Value Ref Range   Glucose-Capillary 156 (H) 65 - 99 mg/dL   Comment 1 Notify RN    Comment 2 Document in Chart   Glucose, capillary     Status: Abnormal   Collection Time: 09/02/17  8:08 PM  Result Value Ref Range   Glucose-Capillary 146 (H) 65 - 99 mg/dL   Comment 1 Notify RN    Comment 2 Document in Chart   CBC     Status: Abnormal   Collection Time: 09/02/17  8:59 PM  Result Value Ref Range   WBC 8.3 4.0 - 10.5 K/uL   RBC 3.20 (L) 4.22 - 5.81 MIL/uL   Hemoglobin 9.5 (L) 13.0 - 17.0 g/dL   HCT 28.5 (L) 39.0 - 52.0 %   MCV 89.1 78.0 - 100.0 fL   MCH 29.7 26.0 - 34.0 pg   MCHC 33.3 30.0 - 36.0 g/dL   RDW 15.4 11.5 - 15.5 %   Platelets 131 (L) 150 - 400 K/uL  Glucose, capillary     Status: Abnormal    Collection Time: 09/02/17 11:56 PM  Result Value Ref Range   Glucose-Capillary 107 (H) 65 - 99 mg/dL   Comment 1 Notify RN    Comment 2 Document in Chart   CBC     Status: Abnormal   Collection Time: 09/03/17  3:30 AM  Result Value Ref Range   WBC 7.8 4.0 - 10.5 K/uL   RBC 3.20 (L) 4.22 - 5.81 MIL/uL   Hemoglobin 9.3 (L) 13.0 - 17.0 g/dL   HCT 28.6 (L) 39.0 - 52.0 %   MCV 89.4 78.0 - 100.0 fL   MCH 29.1 26.0 - 34.0 pg   MCHC 32.5 30.0 - 36.0 g/dL   RDW 15.8 (H) 11.5 - 15.5 %   Platelets 129 (L) 150 - 400 K/uL  Basic metabolic panel     Status: Abnormal   Collection Time: 09/03/17  3:30 AM  Result Value Ref Range   Sodium 139 135 - 145 mmol/L   Potassium 3.8 3.5 - 5.1 mmol/L   Chloride 104 101 - 111 mmol/L   CO2 26 22 - 32 mmol/L   Glucose, Bld 123 (H) 65 - 99 mg/dL   BUN 15 6 - 20 mg/dL   Creatinine, Ser 0.86 0.61 - 1.24 mg/dL   Calcium 8.2 (L) 8.9 - 10.3 mg/dL   GFR calc non Af Amer >60 >60 mL/min   GFR calc Af Amer >60 >60 mL/min   Anion gap 9 5 - 15  Glucose, capillary     Status: Abnormal   Collection Time: 09/03/17  4:24 AM  Result Value Ref Range   Glucose-Capillary 126 (H) 65 - 99 mg/dL   Comment 1 Notify RN    Comment 2 Document in Chart      Studies/Results: No results found.  Marland Kitchen arformoterol  15 mcg Nebulization BID   And  . umeclidinium bromide  1 puff Inhalation Daily  . atorvastatin  40 mg Oral QHS  . celecoxib  200 mg Oral BID  . darifenacin  7.5 mg Oral Daily  . fluticasone  1 spray Each Nare Daily  . gabapentin  300 mg Oral BID  . insulin aspart  0-20 Units Subcutaneous Q4H  . ipratropium-albuterol  3 mL Nebulization BID  . isosorbide mononitrate  10 mg Oral BID  . levothyroxine  125 mcg Oral QAC breakfast  . multivitamin  1 tablet  Oral Daily  . sodium chloride flush  10-40 mL Intracatheter Q12H     Assessment/Plan: s/p Procedure(s): LAPAROSCOPIC ASSISTED ASCENDING COLECTOMY  Multiple tubular adenomas right colon--laroscopic assisted  right colectomy 08/31/2017.  POD#3 Stable Bloody stool was likely old blood from surgery and not active anastomotic bleed.  Continue to monitor Mobilize OOB I have asked nursing staff to ambulate him frequently.. PT and OT consults done-thanks Advance to soft diet  Pathology discussed with patient Check lab work tomorrow  Multiple medical problems.  [facility] hospitalist involved.    Acute blood loss anemia.More bleeding than usual in OR but this appears to have stopped. Transfusion performed yesterday as a precaution. Heparin and aspirin held yesterday as a precaution Check lab work tomorrow  Work with PT today to assess home needs  Creatinine up.  Question AK I superimposed on CKD Creatinine has now normalized Discontinue Foley Nursing staff advised about chronic urinary incontinence and to use either diaper or condom cath, and to check PVR's. Set IV at 75 cc/hr.  VTE prophylaxis.SCDs . Hold subcutaneous heparin   Chronic urinary incontinence. We will leave Foley today because of high risk of retention and/or incontinence until he is more mobile.  Coronary artery disease. History coronary angioplasty No acute cardiac problems. NSR.  Hemodynamically stable.  Denies chest pain or dyspnea.  Continue all usual medication.  Sleep apnea.CPAP being used at night..  SPO2 94%.(RA).     Slept well BMI 36 Adult hypothyroidism, continue supplement Type 2 diabetes-SSI.Metformin.Goal is to keep sugar between 120 and 180  Plan:  -home 1-2 days -Case Management consult placed for home needs (HHN, 3-in-1 commode, RW, etc.) -follow uo - Dr. Dalbert Batman 7 - 10 days     @PROBHOSP @  LOS: 3 days    Adin Hector 09/03/2017  . .prob

## 2017-09-03 NOTE — Progress Notes (Signed)
Patient ambulated three times with standy by assist using a walker on this shift, ambulation well tolerated will continue to monitor.

## 2017-09-03 NOTE — Care Management Note (Addendum)
Case Management Note Marvetta Gibbons RN, BSN Unit 4E-Case Manager [phone]  Patient Details  Name: Bryan Hatfield MRN: [id number] Date of Birth: Apr 21, 1927  Subjective/Objective:   Pt admitted s/p LAPAROSCOPIC ASSISTED ASCENDING COLECTOMY on 08/31/17                 Action/Plan: PTA pt lived at home with spouse- per PT eval recommendations for [facility] services- CM to follow for transition to home with [facility]- will need orders prior to discharge.   Expected Discharge Date:                  Expected Discharge Plan:  [place]  In-House Referral:  NA  Discharge planning Services  CM Consult  Post Acute Care Choice:  Home Health Choice offered to:   patient, spouse  DME Arranged:   rolling walker, 3n1 DME Agency:   [place]:   Rn, PT [place] Agency:   [place]  Status of Service:  In process, will continue to follow  If discussed at Long Length of Stay Meetings, dates discussed:    Discharge Disposition: home/home health   Additional Comments:  09/03/17- 1430- Marvetta Gibbons RN, CM- received referral for [facility] and DME needs- spoke with pt and wife at bedside- choice offered for [facility] agencies in [facility]- per choice they would like to use [facility] for  [facility] and DME needs- discussed that pt would need 3n1 and rW for home along with HHRN/PT- orders have been placed- Call made to [facility] with [facility]- she is checking to be sure the w/s office can accept referral- call made to [facility] for DME needs- 3n1 and RW to be delivered to room prior to discharge.  1550-update- notified by Butch Penny with [facility] referral accepted-start of care next week.   Dawayne Patricia, RN 09/03/2017, 11:36 AM

## 2017-09-03 NOTE — Plan of Care (Signed)
  Problem: Education: Goal: Knowledge of General Education information will improve Outcome: Progressing   Problem: Health Behavior/Discharge Planning: Goal: Ability to manage health-related needs will improve Outcome: Progressing   Problem: Clinical Measurements: Goal: Ability to maintain clinical measurements within normal limits will improve Outcome: Progressing   

## 2017-09-04 LAB — GLUCOSE, CAPILLARY
Glucose-Capillary: 127 mg/dL — ABNORMAL HIGH (ref 65–99)
Glucose-Capillary: 118 mg/dL — ABNORMAL HIGH (ref 65–99)

## 2017-09-04 LAB — CBC
HEMATOCRIT: 27.3 % — AB (ref 39.0–52.0)
HEMOGLOBIN: 9.2 g/dL — AB (ref 13.0–17.0)
MCH: 30.8 pg (ref 26.0–34.0)
MCHC: 33.7 g/dL (ref 30.0–36.0)
MCV: 91.3 fL (ref 78.0–100.0)
Platelets: 145 10*3/uL — ABNORMAL LOW (ref 150–400)
RBC: 2.99 MIL/uL — ABNORMAL LOW (ref 4.22–5.81)
RDW: 15.8 % — ABNORMAL HIGH (ref 11.5–15.5)
WBC: 5.7 10*3/uL (ref 4.0–10.5)

## 2017-09-04 LAB — BASIC METABOLIC PANEL
Anion gap: 8 (ref 5–15)
BUN: 12 mg/dL (ref 6–20)
CHLORIDE: 106 mmol/L (ref 101–111)
CO2: 28 mmol/L (ref 22–32)
CREATININE: 0.84 mg/dL (ref 0.61–1.24)
Calcium: 8.2 mg/dL — ABNORMAL LOW (ref 8.9–10.3)
GFR calc non Af Amer: >60 mL/min (ref >60)
GLUCOSE: 136 mg/dL — AB (ref 65–99)
Potassium: 4 mmol/L (ref 3.5–5.1)
Sodium: 142 mmol/L (ref 135–145)

## 2017-09-04 MED ORDER — TRAMADOL HCL 50 MG PO TABS: 50.0000 mg | ORAL_TABLET | Freq: Two times a day (BID) | ORAL | 0 refills | Status: DC | PRN

## 2017-09-04 NOTE — Discharge Summary (Signed)
Patient ID: Bryan Hatfield [id number] 84 y.o. 05/09/1927  Admit date: 08/31/2017  Discharge date and time: No discharge date for patient encounter.  Admitting Physician: Adin Hector  Discharge Physician: Adin Hector  Admission Diagnoses: MASS HEPATIC FLEXURE  Discharge Diagnoses: Villous adenoma of a sending colon                                          Acute blood loss anemia                                          Acute kidney injury, resolved                                         Urinary incontinence, chronic                                          Coronary artery disease, history of angioplasty                                         Obstructive sleep apnea                                          BMI 36                                            Hypothyroidism                                         Diabetes mellitus, type II, non-insulin-dependent  Operations: Procedure(s): LAPAROSCOPIC ASSISTED ASCENDING COLECTOMY  Admission Condition: good  Discharged Condition: good  Indication for Admission:  This is an 84 year old man, referred by Dr. Laurence Spates for surgical opinion regarding management of an 8 cm villous adenoma of the hepatic flexure. Bryan Hatfield is his PCP. Bryan Hatfield is his cardiologist. He is a former patient of mine, having undergone laparoscopic cholecystectomy in 2014.  His health has been stable despite his advanced age. He is independent and drives his car. He noticed some blood in his stool. Colonoscopy showed numerous tiny polyps which were removed. There was an 8 cm mass at the hepatic flexure which was tattooed. Surface biopsies showed villous adenoma. CT scan shows perhaps some luminal narrowing of the ascending colon but no large mass or adenopathy. Liver looks normal. He has infrarenal aortic ectasia measuring 2.7 cm. Some diverticulosis noted. Dr. [name] talked to him about [facility]  [facility] resection and talked about the pros and cons of that. He also discussed surgery. The patient is more interested in surgical management. Dr. Tamala Hatfield has performed cardiac risk assessment stating that  he is low to intermediate risk. cardiac clearance is completed. Comorbidities include some coronary artery disease although he has never had an myocardial infarction. He has no chest pain or exertional dyspnea. Angioplasty 2001. Cardiac catheterization November 25, 2015 which apparently looked pretty good. BMI 36. Hypertension. COPD. Hypothyroidism. Non-insulin-dependent diabetes. Some urinary incontinence and wears a diaper. Left knee surgery under general anesthesia in November 2018 and did well. Recent CBC and liver function tests look good.  We had a very long discussion with the patient and his wife. His performance status is reasonable and I think he is a surgical candidate. I described laparoscopic assisted right colectomy, possible open colectomy.He agrees with this plan    Hospital Course: On the day of admission the patient was taken to the operating room and underwent a laparoscopic-assisted right colectomy.  He had somewhat more bleeding than usual due to extreme friability of the tissues but a primary anastomosis was performed.    Final pathology revealed that he actually had 3 villous adenomas of the ascending colon.  3.5 cm diameter or less.  There was no cancer.  All of his lymph nodes were negative.  He was advised of his pathology and he was very pleased.     He did well postop.  His hemoglobin drifted down to 8.2 and he was transfused with appropriate rise in his hemoglobin to 9.5.  He had one bloody bowel movement, which was his first 1 and then no further bloody bowel movements.  This was felt to be old blood and not active bleeding.  He tolerated this well and was able to ambulate in the hall without chest pain or dyspnea. PT and OT were involved.  They  recommended     home health PT, rolling walker, 3 in 1 commode.  Case management was involved and all of this was requested. He advanced in his diet and activities without difficulty.    Lungs remained clear with vital capacity 2300 cc.  He was ambulating the halls several times a day and was anxious to go home on the day of discharge.  On the day of discharge he was having bowel movements.  They were brown and nonbloody.  He was tolerating a regular diet without nausea or cramps.  His abdomen was soft and nontender.  His lungs were clear.  His midline wound and all of the trocar sites were clean without drainage.  Staples were intact.      I told him to continue all of his usual medications I called in a prescription for tramadol             Diet and activities were discussed in detail       I asked him to see me in the office in 8-10 days to get his staples out   Consults: [facility] hospitalist  Significant Diagnostic Studies: Surgical pathology.  Laboratory work.  Treatments: Laparoscopic assisted right colectomy, blood transfusion  Disposition: Home  Patient Instructions:  Allergies as of 09/04/2017   No Known Allergies     Medication List    TAKE these medications   albuterol 108 (90 Base) MCG/ACT inhaler Commonly known as:  PROVENTIL HFA;VENTOLIN HFA Inhale 2 puffs every 6 (six) hours as needed into the lungs for wheezing or shortness of breath.   AMBULATORY NON FORMULARY MEDICATION Medication Name: 4 prong cane. Dx: gait instability   amoxicillin-clavulanate 875-125 MG tablet Commonly known as:  AUGMENTIN Take 1 tablet by mouth 2 (two) times daily.  aspirin EC 81 MG tablet Take 81 mg by mouth daily.   atorvastatin 40 MG tablet Commonly known as:  LIPITOR TAKE 1 TABLET BY MOUTH AT  BEDTIME   escitalopram 10 MG tablet Commonly known as:  LEXAPRO TAKE 1 TABLET BY MOUTH  DAILY   fluticasone 50 MCG/ACT nasal spray Commonly known as:  FLONASE One spray in each nostril  twice a day, use left hand for right nostril, and right hand for left nostril.   furosemide 40 MG tablet Commonly known as:  LASIX Take 1 tablet (40 mg total) by mouth daily.   ICAPS AREDS 2 Caps Take 1 capsule 2 (two) times daily by mouth.   ipratropium-albuterol 0.5-2.5 (3) MG/3ML Soln Commonly known as:  DUONEB USE 1 VIAL VIA NEBULIZER EVERY 2 HOURS AS NEEDED FOR WHEEZING, SHORTNESS OF BREATH.   isosorbide mononitrate 30 MG 24 hr tablet Commonly known as:  IMDUR Take 30 mg by mouth daily.   levothyroxine 125 MCG tablet Commonly known as:  SYNTHROID, LEVOTHROID Take 1 tablet (125 mcg total) by mouth daily before breakfast. TAKE 1 TABLET BY MOUTH  DAILY BEFORE BREAKFAST   levothyroxine 125 MCG tablet Commonly known as:  SYNTHROID, LEVOTHROID TAKE 1 TABLET(125 MCG) BY MOUTH DAILY BEFORE BREAKFAST   metFORMIN 500 MG 24 hr tablet Commonly known as:  GLUCOPHAGE-XR Take 1 tablet (500 mg total) by mouth daily with breakfast.   nitroGLYCERIN 0.4 MG SL tablet Commonly known as:  NITROSTAT DISSOLVE 1 TABLET UNDER THE TONGUE EVERY 5 MINUTES AS  NEEDED CHEST PAIN   Tiotropium Bromide-Olodaterol 2.5-2.5 MCG/ACT Aers Commonly known as:  STIOLTO RESPIMAT Inhale 2 puffs into the lungs daily.   traMADol 50 MG tablet Commonly known as:  ULTRAM Take 1 tablet (50 mg total) by mouth every 12 (twelve) hours as needed for moderate pain.   triamcinolone cream 0.1 % Commonly known as:  KENALOG APPLY EXTERNALLY TO THE AFFECTED AREA DAILY FOR UP TO 2 WEEKS AS NEEDED. AVOID FACE What changed:  See the new instructions.   VESICARE 5 MG tablet Generic drug:  solifenacin Take 5 mg by mouth daily.   VITAMIN C PO Take 1 tablet by mouth daily.   zolpidem 10 MG tablet Commonly known as:  AMBIEN Take 1 tablet (10 mg total) by mouth at bedtime as needed for sleep. Ok to refill 06/01/2016            Durable Medical Equipment  (From admission, onward)        Start     Ordered   09/04/17  0807  DME 3-in-1  Once     09/04/17 1696   09/04/17 0807  For home use only DME Walker rolling  [facility])  Once    Comments:  Case management, physical therapy, face-to-face already done.  Question:  Patient needs a walker to treat with the following condition  Answer:  At standard risk for fall   09/04/17 0812   09/03/17 1420  For home use only DME 3 n 1  Once     09/03/17 1419   09/03/17 1419  For home use only DME Walker rolling  Once    Comments:  Post op  Question:  Patient needs a walker to treat with the following condition  Answer:  Weakness   09/03/17 1419       Discharge Care Instructions  (From admission, onward)        Start     Ordered   09/04/17 0000  Discharge wound  care:    Comments:  Okay to shower Cover midline wound with dry bandage daily Dr. Dalbert Batman will remove the staples in the office in 8-10 days.  Please call for an appointment   09/04/17 0812      Activity: Frequent ambulation.  No driving.  No sports.  No lifting more than 15 pounds. Diet: diabetic diet Wound Care: as directed  Follow-up:  With Dr. Dalbert Batman in 8-10 days.  Signed: Edsel Petrin. Dalbert Batman, M.D., FACS General and minimally invasive surgery Breast and Colorectal Surgery  Addendum: I logged on to the [place] website and reviewed his prescription medication history   09/04/2017, 8:13 AM

## 2017-09-04 NOTE — Discharge Instructions (Signed)
[facility]      [facility] [phone]  OPEN ABDOMINAL SURGERY: POST OP INSTRUCTIONS  Always review your discharge instruction sheet given to you by the facility where your surgery was performed.  IF YOU HAVE DISABILITY OR FAMILY LEAVE FORMS, YOU MUST BRING THEM TO THE OFFICE FOR PROCESSING.  PLEASE DO NOT GIVE THEM TO YOUR DOCTOR.  1. A prescription for pain medication may be given to you upon discharge.  Take your pain medication as prescribed, if needed.  If narcotic pain medicine is not needed, then you may take acetaminophen (Tylenol) or ibuprofen (Advil) as needed. 2. Take your usually prescribed medications unless otherwise directed. 3. If you need a refill on your pain medication, please contact your pharmacy. They will contact our office to request authorization.  Prescriptions will not be filled after 5pm or on week-ends. 4. You should follow a light diet the first few days after arrival home, such as soup and crackers, pudding, etc.unless your doctor has advised otherwise. A high-fiber, low fat diet can be resumed as tolerated.   Be sure to include lots of fluids daily. Most patients will experience some swelling and bruising on the chest and neck area.  Ice packs will help.  Swelling and bruising can take several days to resolve 5. Most patients will experience some swelling and bruising in the area of the incision. Ice pack will help. Swelling and bruising can take several days to resolve..  6. It is common to experience some constipation if taking pain medication after surgery.  Increasing fluid intake and taking a stool softener will usually help or prevent this problem from occurring.  A mild laxative (Milk of Magnesia or Miralax) should be taken according to package directions if there are no bowel movements after 48 hours. 7.  You may have steri-strips (small skin tapes) in place directly over the incision.  These strips should be left on the skin for 7-10 days.  If your  surgeon used skin glue on the incision, you may shower in 24 hours.  The glue will flake off over the next 2-3 weeks.  Any sutures or staples will be removed at the office during your follow-up visit. You may find that a light gauze bandage over your incision may keep your staples from being rubbed or pulled. You may shower and replace the bandage daily. 8. ACTIVITIES:  You may resume regular (light) daily activities beginning the next day--such as daily self-care, walking, climbing stairs--gradually increasing activities as tolerated.  You may have sexual intercourse when it is comfortable.  Refrain from any heavy lifting or straining until approved by your doctor. a. You may drive when you no longer are taking prescription pain medication, you can comfortably wear a seatbelt, and you can safely maneuver your car and apply brakes b. Return to Work: ___________________________________ 9. You should see your doctor in the office for a follow-up appointment approximately two weeks after your surgery.  Make sure that you call for this appointment within a day or two after you arrive home to insure a convenient appointment time. OTHER INSTRUCTIONS:  _____________________________________________________________ _____________________________________________________________  WHEN TO CALL YOUR DOCTOR: 1. Fever over 101.0 2. Inability to urinate 3. Nausea and/or vomiting 4. Extreme swelling or bruising 5. Continued bleeding from incision. 6. Increased pain, redness, or drainage from the incision. 7. Difficulty swallowing or breathing 8. Muscle cramping or spasms. 9. Numbness or tingling in hands or feet or around lips.  The clinic staff is available to   answer your questions during regular business hours.  Please don't hesitate to call and ask to speak to one of the nurses if you have concerns.  For further questions, please visit [email or web address]   

## 2017-09-04 NOTE — Progress Notes (Signed)
Patient in a a stable condition, discharge education reviewed with this patient at bedside, he verbalized understanding, wound dressing changed, iv dc, tele dc ccmd notified, patient belongings at bedside, home equipment at bedside, patient transported on a wheelchair to the main entrance along with his personal belongings. Patient to be transported home by his wife.

## 2017-09-06 ENCOUNTER — Ambulatory Visit: Payer: Medicare Other | Admitting: Family Medicine

## 2017-09-06 DIAGNOSIS — Z9861 Coronary angioplasty status: Secondary | ICD-10-CM | POA: Diagnosis not present

## 2017-09-06 DIAGNOSIS — E039 Hypothyroidism, unspecified: Secondary | ICD-10-CM | POA: Diagnosis not present

## 2017-09-06 DIAGNOSIS — D374 Neoplasm of uncertain behavior of colon: Secondary | ICD-10-CM | POA: Diagnosis not present

## 2017-09-06 DIAGNOSIS — I2510 Atherosclerotic heart disease of native coronary artery without angina pectoris: Secondary | ICD-10-CM | POA: Diagnosis not present

## 2017-09-06 DIAGNOSIS — Z483 Aftercare following surgery for neoplasm: Secondary | ICD-10-CM | POA: Diagnosis not present

## 2017-09-06 DIAGNOSIS — J449 Chronic obstructive pulmonary disease, unspecified: Secondary | ICD-10-CM | POA: Diagnosis not present

## 2017-09-06 DIAGNOSIS — G4733 Obstructive sleep apnea (adult) (pediatric): Secondary | ICD-10-CM | POA: Diagnosis not present

## 2017-09-06 DIAGNOSIS — E119 Type 2 diabetes mellitus without complications: Secondary | ICD-10-CM | POA: Diagnosis not present

## 2017-09-06 DIAGNOSIS — Z7984 Long term (current) use of oral hypoglycemic drugs: Secondary | ICD-10-CM | POA: Diagnosis not present

## 2017-09-06 DIAGNOSIS — Z7982 Long term (current) use of aspirin: Secondary | ICD-10-CM | POA: Diagnosis not present

## 2017-09-06 NOTE — Consult Note (Signed)
            [facility] CM Primary Care Navigator  09/06/2017  Bryan Hatfield [birth year] [id number]  Attempt toseepatient at the bedside to identify possible discharge needsbuthe was already discharged. Patient was discharged home with home health services over the weekend.   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  Patient had discharge instruction to follow-up with surgeon in 8- 10 days.   For additional questions please contact:  Edwena Felty A. Mireyah Chervenak, BSN, RN-BC [facility] PRIMARY CARE Navigator Cell: [phone]

## 2017-09-06 NOTE — Telephone Encounter (Signed)
Patient reports he was in the hospital recently and wants to wait for the knee injections. He will call back to set up an appointment for Orthovisc at a later date.

## 2017-09-07 DIAGNOSIS — Z7984 Long term (current) use of oral hypoglycemic drugs: Secondary | ICD-10-CM | POA: Diagnosis not present

## 2017-09-07 DIAGNOSIS — Z9861 Coronary angioplasty status: Secondary | ICD-10-CM | POA: Diagnosis not present

## 2017-09-07 DIAGNOSIS — E039 Hypothyroidism, unspecified: Secondary | ICD-10-CM | POA: Diagnosis not present

## 2017-09-07 DIAGNOSIS — Z7982 Long term (current) use of aspirin: Secondary | ICD-10-CM | POA: Diagnosis not present

## 2017-09-07 DIAGNOSIS — E119 Type 2 diabetes mellitus without complications: Secondary | ICD-10-CM | POA: Diagnosis not present

## 2017-09-07 DIAGNOSIS — D374 Neoplasm of uncertain behavior of colon: Secondary | ICD-10-CM | POA: Diagnosis not present

## 2017-09-07 DIAGNOSIS — I2510 Atherosclerotic heart disease of native coronary artery without angina pectoris: Secondary | ICD-10-CM | POA: Diagnosis not present

## 2017-09-07 DIAGNOSIS — Z483 Aftercare following surgery for neoplasm: Secondary | ICD-10-CM | POA: Diagnosis not present

## 2017-09-07 DIAGNOSIS — J449 Chronic obstructive pulmonary disease, unspecified: Secondary | ICD-10-CM | POA: Diagnosis not present

## 2017-09-07 DIAGNOSIS — G4733 Obstructive sleep apnea (adult) (pediatric): Secondary | ICD-10-CM | POA: Diagnosis not present

## 2017-09-10 DIAGNOSIS — J449 Chronic obstructive pulmonary disease, unspecified: Secondary | ICD-10-CM | POA: Diagnosis not present

## 2017-09-10 DIAGNOSIS — Z7982 Long term (current) use of aspirin: Secondary | ICD-10-CM | POA: Diagnosis not present

## 2017-09-10 DIAGNOSIS — Z9861 Coronary angioplasty status: Secondary | ICD-10-CM | POA: Diagnosis not present

## 2017-09-10 DIAGNOSIS — D374 Neoplasm of uncertain behavior of colon: Secondary | ICD-10-CM | POA: Diagnosis not present

## 2017-09-10 DIAGNOSIS — E039 Hypothyroidism, unspecified: Secondary | ICD-10-CM | POA: Diagnosis not present

## 2017-09-10 DIAGNOSIS — Z483 Aftercare following surgery for neoplasm: Secondary | ICD-10-CM | POA: Diagnosis not present

## 2017-09-10 DIAGNOSIS — E119 Type 2 diabetes mellitus without complications: Secondary | ICD-10-CM | POA: Diagnosis not present

## 2017-09-10 DIAGNOSIS — G4733 Obstructive sleep apnea (adult) (pediatric): Secondary | ICD-10-CM | POA: Diagnosis not present

## 2017-09-10 DIAGNOSIS — Z7984 Long term (current) use of oral hypoglycemic drugs: Secondary | ICD-10-CM | POA: Diagnosis not present

## 2017-09-10 DIAGNOSIS — I2510 Atherosclerotic heart disease of native coronary artery without angina pectoris: Secondary | ICD-10-CM | POA: Diagnosis not present

## 2017-09-14 DIAGNOSIS — I2510 Atherosclerotic heart disease of native coronary artery without angina pectoris: Secondary | ICD-10-CM | POA: Diagnosis not present

## 2017-09-14 DIAGNOSIS — Z9861 Coronary angioplasty status: Secondary | ICD-10-CM | POA: Diagnosis not present

## 2017-09-14 DIAGNOSIS — E039 Hypothyroidism, unspecified: Secondary | ICD-10-CM | POA: Diagnosis not present

## 2017-09-14 DIAGNOSIS — E119 Type 2 diabetes mellitus without complications: Secondary | ICD-10-CM | POA: Diagnosis not present

## 2017-09-14 DIAGNOSIS — Z7982 Long term (current) use of aspirin: Secondary | ICD-10-CM | POA: Diagnosis not present

## 2017-09-14 DIAGNOSIS — Z483 Aftercare following surgery for neoplasm: Secondary | ICD-10-CM | POA: Diagnosis not present

## 2017-09-14 DIAGNOSIS — J449 Chronic obstructive pulmonary disease, unspecified: Secondary | ICD-10-CM | POA: Diagnosis not present

## 2017-09-14 DIAGNOSIS — D374 Neoplasm of uncertain behavior of colon: Secondary | ICD-10-CM | POA: Diagnosis not present

## 2017-09-14 DIAGNOSIS — Z7984 Long term (current) use of oral hypoglycemic drugs: Secondary | ICD-10-CM | POA: Diagnosis not present

## 2017-09-14 DIAGNOSIS — G4733 Obstructive sleep apnea (adult) (pediatric): Secondary | ICD-10-CM | POA: Diagnosis not present

## 2017-09-15 ENCOUNTER — Telehealth: Payer: Self-pay | Admitting: Family Medicine

## 2017-09-15 DIAGNOSIS — Z9861 Coronary angioplasty status: Secondary | ICD-10-CM | POA: Diagnosis not present

## 2017-09-15 DIAGNOSIS — Z7982 Long term (current) use of aspirin: Secondary | ICD-10-CM | POA: Diagnosis not present

## 2017-09-15 DIAGNOSIS — I2510 Atherosclerotic heart disease of native coronary artery without angina pectoris: Secondary | ICD-10-CM | POA: Diagnosis not present

## 2017-09-15 DIAGNOSIS — Z7984 Long term (current) use of oral hypoglycemic drugs: Secondary | ICD-10-CM | POA: Diagnosis not present

## 2017-09-15 DIAGNOSIS — D374 Neoplasm of uncertain behavior of colon: Secondary | ICD-10-CM | POA: Diagnosis not present

## 2017-09-15 DIAGNOSIS — E119 Type 2 diabetes mellitus without complications: Secondary | ICD-10-CM | POA: Diagnosis not present

## 2017-09-15 DIAGNOSIS — Z483 Aftercare following surgery for neoplasm: Secondary | ICD-10-CM | POA: Diagnosis not present

## 2017-09-15 DIAGNOSIS — J449 Chronic obstructive pulmonary disease, unspecified: Secondary | ICD-10-CM | POA: Diagnosis not present

## 2017-09-15 DIAGNOSIS — E039 Hypothyroidism, unspecified: Secondary | ICD-10-CM | POA: Diagnosis not present

## 2017-09-15 DIAGNOSIS — G4733 Obstructive sleep apnea (adult) (pediatric): Secondary | ICD-10-CM | POA: Diagnosis not present

## 2017-09-15 NOTE — Telephone Encounter (Signed)
OK to sent a gerneric rx.

## 2017-09-15 NOTE — Telephone Encounter (Signed)
Pts wife called and states he needs a meter. Please call wife back and let her know status

## 2017-09-15 NOTE — Telephone Encounter (Signed)
Pt's wife advised.

## 2017-09-16 ENCOUNTER — Other Ambulatory Visit: Payer: Self-pay | Admitting: Family Medicine

## 2017-09-16 DIAGNOSIS — I2510 Atherosclerotic heart disease of native coronary artery without angina pectoris: Secondary | ICD-10-CM | POA: Diagnosis not present

## 2017-09-16 DIAGNOSIS — D374 Neoplasm of uncertain behavior of colon: Secondary | ICD-10-CM | POA: Diagnosis not present

## 2017-09-16 DIAGNOSIS — E119 Type 2 diabetes mellitus without complications: Secondary | ICD-10-CM | POA: Diagnosis not present

## 2017-09-16 DIAGNOSIS — G4733 Obstructive sleep apnea (adult) (pediatric): Secondary | ICD-10-CM | POA: Diagnosis not present

## 2017-09-16 DIAGNOSIS — Z7984 Long term (current) use of oral hypoglycemic drugs: Secondary | ICD-10-CM | POA: Diagnosis not present

## 2017-09-16 DIAGNOSIS — Z9861 Coronary angioplasty status: Secondary | ICD-10-CM | POA: Diagnosis not present

## 2017-09-16 DIAGNOSIS — J449 Chronic obstructive pulmonary disease, unspecified: Secondary | ICD-10-CM | POA: Diagnosis not present

## 2017-09-16 DIAGNOSIS — Z483 Aftercare following surgery for neoplasm: Secondary | ICD-10-CM | POA: Diagnosis not present

## 2017-09-16 DIAGNOSIS — E039 Hypothyroidism, unspecified: Secondary | ICD-10-CM | POA: Diagnosis not present

## 2017-09-16 DIAGNOSIS — Z7982 Long term (current) use of aspirin: Secondary | ICD-10-CM | POA: Diagnosis not present

## 2017-09-20 ENCOUNTER — Encounter: Payer: Self-pay | Admitting: Family Medicine

## 2017-09-20 ENCOUNTER — Ambulatory Visit (INDEPENDENT_AMBULATORY_CARE_PROVIDER_SITE_OTHER): Payer: Medicare Other | Admitting: Family Medicine

## 2017-09-20 VITALS — BP 131/52 | HR 73 | Temp 98.6°F | Ht 71.0 in | Wt 256.0 lb

## 2017-09-20 DIAGNOSIS — J0100 Acute maxillary sinusitis, unspecified: Secondary | ICD-10-CM

## 2017-09-20 DIAGNOSIS — D369 Benign neoplasm, unspecified site: Secondary | ICD-10-CM

## 2017-09-20 MED ORDER — AZITHROMYCIN 250 MG PO TABS: ORAL_TABLET | ORAL | 0 refills | Status: AC

## 2017-09-20 NOTE — Progress Notes (Signed)
   Subjective:    Patient ID: Bryan Hatfield, male    DOB: 14-Aug-1926, 84 y.o.   MRN: [id number]  HPI 84 year old male comes in today complaining cough and nasal congestion x 1 week.  C/O bilat maxillary pressure. No fever, chills or sweats. No ear pain.  No ST. Slight SOB and some mild cough.    He did have surgery for removal of the colon mass and says it was benign. Though he did bleed afterwards.  He had to be transfused 2 units PRBC and he is doing better.  His incision is healing well.  The staples removed and he does have Steri-Strips in place that are coming off gradually.  He is a follow-up of believe next week with the surgeon.  Review of Systems     Objective:   Physical Exam  Constitutional: He is oriented to person, place, and time. He appears well-developed and well-nourished.  He is using a walker today.   HENT:  Head: Normocephalic and atraumatic.  Right Ear: External ear normal.  Left Ear: External ear normal.  Nose: Nose normal.  Mouth/Throat: Oropharynx is clear and moist.  TMs and canals are clear.   Eyes: Pupils are equal, round, and reactive to light. Conjunctivae and EOM are normal.  Neck: Neck supple. No thyromegaly present.  Cardiovascular: Normal rate and normal heart sounds.  Pulmonary/Chest: Effort normal and breath sounds normal.  Lymphadenopathy:    He has no cervical adenopathy.  Neurological: He is alert and oriented to person, place, and time.  Skin: Skin is warm and dry.  Psychiatric: He has a normal mood and affect.          Assessment & Plan:  Acute sinusitis -likely viral at this point but he has been sick for 5 days and is not feel like he is getting any better.  If he is not improving in the next couple days and okay to fill the prescription for the antibiotic or if he feels like he suddenly getting worse and please let us know.  Lungs actually sound clear today even though he did mention a little increased shortness of breath.  Benign colon  mass-doing well after surgery.  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  Hopefully that will recheck a hemoglobin at his follow-up appointment next week.  Beatties-plan to follow-up at the end of May.  He did order a new glucometer from his mail order.

## 2017-09-20 NOTE — Patient Instructions (Signed)

## 2017-09-21 ENCOUNTER — Other Ambulatory Visit: Payer: Self-pay | Admitting: *Deleted

## 2017-09-21 DIAGNOSIS — E039 Hypothyroidism, unspecified: Secondary | ICD-10-CM | POA: Diagnosis not present

## 2017-09-21 DIAGNOSIS — Z7984 Long term (current) use of oral hypoglycemic drugs: Secondary | ICD-10-CM | POA: Diagnosis not present

## 2017-09-21 DIAGNOSIS — J449 Chronic obstructive pulmonary disease, unspecified: Secondary | ICD-10-CM | POA: Diagnosis not present

## 2017-09-21 DIAGNOSIS — E119 Type 2 diabetes mellitus without complications: Secondary | ICD-10-CM | POA: Diagnosis not present

## 2017-09-21 DIAGNOSIS — D374 Neoplasm of uncertain behavior of colon: Secondary | ICD-10-CM | POA: Diagnosis not present

## 2017-09-21 DIAGNOSIS — Z9861 Coronary angioplasty status: Secondary | ICD-10-CM | POA: Diagnosis not present

## 2017-09-21 DIAGNOSIS — G4733 Obstructive sleep apnea (adult) (pediatric): Secondary | ICD-10-CM | POA: Diagnosis not present

## 2017-09-21 DIAGNOSIS — I2510 Atherosclerotic heart disease of native coronary artery without angina pectoris: Secondary | ICD-10-CM | POA: Diagnosis not present

## 2017-09-21 DIAGNOSIS — Z483 Aftercare following surgery for neoplasm: Secondary | ICD-10-CM | POA: Diagnosis not present

## 2017-09-21 DIAGNOSIS — Z7982 Long term (current) use of aspirin: Secondary | ICD-10-CM | POA: Diagnosis not present

## 2017-09-21 MED ORDER — ONETOUCH ULTRA 2 W/DEVICE KIT: PACK | 0 refills | Status: AC

## 2017-09-22 DIAGNOSIS — E119 Type 2 diabetes mellitus without complications: Secondary | ICD-10-CM | POA: Diagnosis not present

## 2017-09-22 DIAGNOSIS — Z483 Aftercare following surgery for neoplasm: Secondary | ICD-10-CM | POA: Diagnosis not present

## 2017-09-22 DIAGNOSIS — Z7984 Long term (current) use of oral hypoglycemic drugs: Secondary | ICD-10-CM | POA: Diagnosis not present

## 2017-09-22 DIAGNOSIS — Z9861 Coronary angioplasty status: Secondary | ICD-10-CM | POA: Diagnosis not present

## 2017-09-22 DIAGNOSIS — G4733 Obstructive sleep apnea (adult) (pediatric): Secondary | ICD-10-CM | POA: Diagnosis not present

## 2017-09-22 DIAGNOSIS — I2510 Atherosclerotic heart disease of native coronary artery without angina pectoris: Secondary | ICD-10-CM | POA: Diagnosis not present

## 2017-09-22 DIAGNOSIS — E039 Hypothyroidism, unspecified: Secondary | ICD-10-CM | POA: Diagnosis not present

## 2017-09-22 DIAGNOSIS — Z7982 Long term (current) use of aspirin: Secondary | ICD-10-CM | POA: Diagnosis not present

## 2017-09-22 DIAGNOSIS — J449 Chronic obstructive pulmonary disease, unspecified: Secondary | ICD-10-CM | POA: Diagnosis not present

## 2017-09-22 DIAGNOSIS — D374 Neoplasm of uncertain behavior of colon: Secondary | ICD-10-CM | POA: Diagnosis not present

## 2017-09-23 ENCOUNTER — Other Ambulatory Visit: Payer: Self-pay | Admitting: Family Medicine

## 2017-09-23 DIAGNOSIS — G4733 Obstructive sleep apnea (adult) (pediatric): Secondary | ICD-10-CM | POA: Diagnosis not present

## 2017-09-23 DIAGNOSIS — E039 Hypothyroidism, unspecified: Secondary | ICD-10-CM | POA: Diagnosis not present

## 2017-09-23 DIAGNOSIS — I2510 Atherosclerotic heart disease of native coronary artery without angina pectoris: Secondary | ICD-10-CM | POA: Diagnosis not present

## 2017-09-23 DIAGNOSIS — Z9861 Coronary angioplasty status: Secondary | ICD-10-CM | POA: Diagnosis not present

## 2017-09-23 DIAGNOSIS — Z483 Aftercare following surgery for neoplasm: Secondary | ICD-10-CM | POA: Diagnosis not present

## 2017-09-23 DIAGNOSIS — D649 Anemia, unspecified: Secondary | ICD-10-CM | POA: Diagnosis not present

## 2017-09-23 DIAGNOSIS — D374 Neoplasm of uncertain behavior of colon: Secondary | ICD-10-CM | POA: Diagnosis not present

## 2017-09-23 DIAGNOSIS — Z7982 Long term (current) use of aspirin: Secondary | ICD-10-CM | POA: Diagnosis not present

## 2017-09-23 DIAGNOSIS — J449 Chronic obstructive pulmonary disease, unspecified: Secondary | ICD-10-CM | POA: Diagnosis not present

## 2017-09-23 DIAGNOSIS — Z7984 Long term (current) use of oral hypoglycemic drugs: Secondary | ICD-10-CM | POA: Diagnosis not present

## 2017-09-23 DIAGNOSIS — E119 Type 2 diabetes mellitus without complications: Secondary | ICD-10-CM | POA: Diagnosis not present

## 2017-09-24 ENCOUNTER — Other Ambulatory Visit: Payer: Self-pay | Admitting: *Deleted

## 2017-09-24 ENCOUNTER — Ambulatory Visit: Payer: Medicare Other | Admitting: Family Medicine

## 2017-09-28 DIAGNOSIS — Z7982 Long term (current) use of aspirin: Secondary | ICD-10-CM | POA: Diagnosis not present

## 2017-09-28 DIAGNOSIS — Z483 Aftercare following surgery for neoplasm: Secondary | ICD-10-CM | POA: Diagnosis not present

## 2017-09-28 DIAGNOSIS — E119 Type 2 diabetes mellitus without complications: Secondary | ICD-10-CM | POA: Diagnosis not present

## 2017-09-28 DIAGNOSIS — I2510 Atherosclerotic heart disease of native coronary artery without angina pectoris: Secondary | ICD-10-CM | POA: Diagnosis not present

## 2017-09-28 DIAGNOSIS — D374 Neoplasm of uncertain behavior of colon: Secondary | ICD-10-CM | POA: Diagnosis not present

## 2017-09-28 DIAGNOSIS — Z9861 Coronary angioplasty status: Secondary | ICD-10-CM | POA: Diagnosis not present

## 2017-09-28 DIAGNOSIS — E039 Hypothyroidism, unspecified: Secondary | ICD-10-CM | POA: Diagnosis not present

## 2017-09-28 DIAGNOSIS — Z7984 Long term (current) use of oral hypoglycemic drugs: Secondary | ICD-10-CM | POA: Diagnosis not present

## 2017-09-28 DIAGNOSIS — G4733 Obstructive sleep apnea (adult) (pediatric): Secondary | ICD-10-CM | POA: Diagnosis not present

## 2017-09-28 DIAGNOSIS — J449 Chronic obstructive pulmonary disease, unspecified: Secondary | ICD-10-CM | POA: Diagnosis not present

## 2017-09-29 ENCOUNTER — Other Ambulatory Visit: Payer: Self-pay | Admitting: *Deleted

## 2017-09-29 DIAGNOSIS — E119 Type 2 diabetes mellitus without complications: Secondary | ICD-10-CM

## 2017-09-29 MED ORDER — GLUCOSE BLOOD VI STRP: ORAL_STRIP | 12 refills | Status: AC

## 2017-09-30 DIAGNOSIS — Z483 Aftercare following surgery for neoplasm: Secondary | ICD-10-CM | POA: Diagnosis not present

## 2017-09-30 DIAGNOSIS — Z9861 Coronary angioplasty status: Secondary | ICD-10-CM | POA: Diagnosis not present

## 2017-09-30 DIAGNOSIS — D374 Neoplasm of uncertain behavior of colon: Secondary | ICD-10-CM | POA: Diagnosis not present

## 2017-09-30 DIAGNOSIS — I2510 Atherosclerotic heart disease of native coronary artery without angina pectoris: Secondary | ICD-10-CM | POA: Diagnosis not present

## 2017-09-30 DIAGNOSIS — Z7982 Long term (current) use of aspirin: Secondary | ICD-10-CM | POA: Diagnosis not present

## 2017-09-30 DIAGNOSIS — G4733 Obstructive sleep apnea (adult) (pediatric): Secondary | ICD-10-CM | POA: Diagnosis not present

## 2017-09-30 DIAGNOSIS — E119 Type 2 diabetes mellitus without complications: Secondary | ICD-10-CM | POA: Diagnosis not present

## 2017-09-30 DIAGNOSIS — E039 Hypothyroidism, unspecified: Secondary | ICD-10-CM | POA: Diagnosis not present

## 2017-09-30 DIAGNOSIS — J449 Chronic obstructive pulmonary disease, unspecified: Secondary | ICD-10-CM | POA: Diagnosis not present

## 2017-09-30 DIAGNOSIS — Z7984 Long term (current) use of oral hypoglycemic drugs: Secondary | ICD-10-CM | POA: Diagnosis not present

## 2017-10-04 ENCOUNTER — Other Ambulatory Visit: Payer: Self-pay | Admitting: Family Medicine

## 2017-10-05 DIAGNOSIS — G4733 Obstructive sleep apnea (adult) (pediatric): Secondary | ICD-10-CM | POA: Diagnosis not present

## 2017-10-05 DIAGNOSIS — Z483 Aftercare following surgery for neoplasm: Secondary | ICD-10-CM | POA: Diagnosis not present

## 2017-10-05 DIAGNOSIS — D374 Neoplasm of uncertain behavior of colon: Secondary | ICD-10-CM | POA: Diagnosis not present

## 2017-10-05 DIAGNOSIS — E039 Hypothyroidism, unspecified: Secondary | ICD-10-CM | POA: Diagnosis not present

## 2017-10-05 DIAGNOSIS — Z7984 Long term (current) use of oral hypoglycemic drugs: Secondary | ICD-10-CM | POA: Diagnosis not present

## 2017-10-05 DIAGNOSIS — Z7982 Long term (current) use of aspirin: Secondary | ICD-10-CM | POA: Diagnosis not present

## 2017-10-05 DIAGNOSIS — I2510 Atherosclerotic heart disease of native coronary artery without angina pectoris: Secondary | ICD-10-CM | POA: Diagnosis not present

## 2017-10-05 DIAGNOSIS — Z9861 Coronary angioplasty status: Secondary | ICD-10-CM | POA: Diagnosis not present

## 2017-10-05 DIAGNOSIS — J449 Chronic obstructive pulmonary disease, unspecified: Secondary | ICD-10-CM | POA: Diagnosis not present

## 2017-10-05 DIAGNOSIS — E119 Type 2 diabetes mellitus without complications: Secondary | ICD-10-CM | POA: Diagnosis not present

## 2017-10-08 DIAGNOSIS — Z483 Aftercare following surgery for neoplasm: Secondary | ICD-10-CM | POA: Diagnosis not present

## 2017-10-08 DIAGNOSIS — E039 Hypothyroidism, unspecified: Secondary | ICD-10-CM | POA: Diagnosis not present

## 2017-10-08 DIAGNOSIS — Z7984 Long term (current) use of oral hypoglycemic drugs: Secondary | ICD-10-CM | POA: Diagnosis not present

## 2017-10-08 DIAGNOSIS — I2510 Atherosclerotic heart disease of native coronary artery without angina pectoris: Secondary | ICD-10-CM | POA: Diagnosis not present

## 2017-10-08 DIAGNOSIS — E119 Type 2 diabetes mellitus without complications: Secondary | ICD-10-CM | POA: Diagnosis not present

## 2017-10-08 DIAGNOSIS — Z9861 Coronary angioplasty status: Secondary | ICD-10-CM | POA: Diagnosis not present

## 2017-10-08 DIAGNOSIS — D374 Neoplasm of uncertain behavior of colon: Secondary | ICD-10-CM | POA: Diagnosis not present

## 2017-10-08 DIAGNOSIS — J449 Chronic obstructive pulmonary disease, unspecified: Secondary | ICD-10-CM | POA: Diagnosis not present

## 2017-10-08 DIAGNOSIS — G4733 Obstructive sleep apnea (adult) (pediatric): Secondary | ICD-10-CM | POA: Diagnosis not present

## 2017-10-08 DIAGNOSIS — Z7982 Long term (current) use of aspirin: Secondary | ICD-10-CM | POA: Diagnosis not present

## 2017-10-12 DIAGNOSIS — Z9861 Coronary angioplasty status: Secondary | ICD-10-CM | POA: Diagnosis not present

## 2017-10-12 DIAGNOSIS — J449 Chronic obstructive pulmonary disease, unspecified: Secondary | ICD-10-CM | POA: Diagnosis not present

## 2017-10-12 DIAGNOSIS — Z7982 Long term (current) use of aspirin: Secondary | ICD-10-CM | POA: Diagnosis not present

## 2017-10-12 DIAGNOSIS — D374 Neoplasm of uncertain behavior of colon: Secondary | ICD-10-CM | POA: Diagnosis not present

## 2017-10-12 DIAGNOSIS — E119 Type 2 diabetes mellitus without complications: Secondary | ICD-10-CM | POA: Diagnosis not present

## 2017-10-12 DIAGNOSIS — E039 Hypothyroidism, unspecified: Secondary | ICD-10-CM | POA: Diagnosis not present

## 2017-10-12 DIAGNOSIS — I2510 Atherosclerotic heart disease of native coronary artery without angina pectoris: Secondary | ICD-10-CM | POA: Diagnosis not present

## 2017-10-12 DIAGNOSIS — Z483 Aftercare following surgery for neoplasm: Secondary | ICD-10-CM | POA: Diagnosis not present

## 2017-10-12 DIAGNOSIS — Z7984 Long term (current) use of oral hypoglycemic drugs: Secondary | ICD-10-CM | POA: Diagnosis not present

## 2017-10-12 DIAGNOSIS — G4733 Obstructive sleep apnea (adult) (pediatric): Secondary | ICD-10-CM | POA: Diagnosis not present

## 2017-10-15 ENCOUNTER — Other Ambulatory Visit: Payer: Self-pay | Admitting: Family Medicine

## 2017-10-18 DIAGNOSIS — Z483 Aftercare following surgery for neoplasm: Secondary | ICD-10-CM | POA: Diagnosis not present

## 2017-10-18 DIAGNOSIS — G4733 Obstructive sleep apnea (adult) (pediatric): Secondary | ICD-10-CM | POA: Diagnosis not present

## 2017-10-18 DIAGNOSIS — J449 Chronic obstructive pulmonary disease, unspecified: Secondary | ICD-10-CM | POA: Diagnosis not present

## 2017-10-18 DIAGNOSIS — E039 Hypothyroidism, unspecified: Secondary | ICD-10-CM | POA: Diagnosis not present

## 2017-10-18 DIAGNOSIS — Z7982 Long term (current) use of aspirin: Secondary | ICD-10-CM | POA: Diagnosis not present

## 2017-10-18 DIAGNOSIS — Z9861 Coronary angioplasty status: Secondary | ICD-10-CM | POA: Diagnosis not present

## 2017-10-18 DIAGNOSIS — Z7984 Long term (current) use of oral hypoglycemic drugs: Secondary | ICD-10-CM | POA: Diagnosis not present

## 2017-10-18 DIAGNOSIS — I2510 Atherosclerotic heart disease of native coronary artery without angina pectoris: Secondary | ICD-10-CM | POA: Diagnosis not present

## 2017-10-18 DIAGNOSIS — D374 Neoplasm of uncertain behavior of colon: Secondary | ICD-10-CM | POA: Diagnosis not present

## 2017-10-18 DIAGNOSIS — E119 Type 2 diabetes mellitus without complications: Secondary | ICD-10-CM | POA: Diagnosis not present

## 2017-10-18 NOTE — Telephone Encounter (Signed)
[facility] requesting RF on Zolpidem.  Last RX sent on 08-11-17 for #30 with 1 RF   RX pended. Please review and send if appropriate.  Thanks!

## 2017-10-20 DIAGNOSIS — Z7984 Long term (current) use of oral hypoglycemic drugs: Secondary | ICD-10-CM | POA: Diagnosis not present

## 2017-10-20 DIAGNOSIS — I2510 Atherosclerotic heart disease of native coronary artery without angina pectoris: Secondary | ICD-10-CM | POA: Diagnosis not present

## 2017-10-20 DIAGNOSIS — J449 Chronic obstructive pulmonary disease, unspecified: Secondary | ICD-10-CM | POA: Diagnosis not present

## 2017-10-20 DIAGNOSIS — Z483 Aftercare following surgery for neoplasm: Secondary | ICD-10-CM | POA: Diagnosis not present

## 2017-10-20 DIAGNOSIS — Z9861 Coronary angioplasty status: Secondary | ICD-10-CM | POA: Diagnosis not present

## 2017-10-20 DIAGNOSIS — Z7982 Long term (current) use of aspirin: Secondary | ICD-10-CM | POA: Diagnosis not present

## 2017-10-20 DIAGNOSIS — E039 Hypothyroidism, unspecified: Secondary | ICD-10-CM | POA: Diagnosis not present

## 2017-10-20 DIAGNOSIS — G4733 Obstructive sleep apnea (adult) (pediatric): Secondary | ICD-10-CM | POA: Diagnosis not present

## 2017-10-20 DIAGNOSIS — E119 Type 2 diabetes mellitus without complications: Secondary | ICD-10-CM | POA: Diagnosis not present

## 2017-10-20 DIAGNOSIS — D374 Neoplasm of uncertain behavior of colon: Secondary | ICD-10-CM | POA: Diagnosis not present

## 2017-10-25 DIAGNOSIS — Z9861 Coronary angioplasty status: Secondary | ICD-10-CM | POA: Diagnosis not present

## 2017-10-25 DIAGNOSIS — E119 Type 2 diabetes mellitus without complications: Secondary | ICD-10-CM | POA: Diagnosis not present

## 2017-10-25 DIAGNOSIS — Z483 Aftercare following surgery for neoplasm: Secondary | ICD-10-CM | POA: Diagnosis not present

## 2017-10-25 DIAGNOSIS — E039 Hypothyroidism, unspecified: Secondary | ICD-10-CM | POA: Diagnosis not present

## 2017-10-25 DIAGNOSIS — Z7982 Long term (current) use of aspirin: Secondary | ICD-10-CM | POA: Diagnosis not present

## 2017-10-25 DIAGNOSIS — I2510 Atherosclerotic heart disease of native coronary artery without angina pectoris: Secondary | ICD-10-CM | POA: Diagnosis not present

## 2017-10-25 DIAGNOSIS — J449 Chronic obstructive pulmonary disease, unspecified: Secondary | ICD-10-CM | POA: Diagnosis not present

## 2017-10-25 DIAGNOSIS — Z7984 Long term (current) use of oral hypoglycemic drugs: Secondary | ICD-10-CM | POA: Diagnosis not present

## 2017-10-25 DIAGNOSIS — D374 Neoplasm of uncertain behavior of colon: Secondary | ICD-10-CM | POA: Diagnosis not present

## 2017-10-25 DIAGNOSIS — G4733 Obstructive sleep apnea (adult) (pediatric): Secondary | ICD-10-CM | POA: Diagnosis not present

## 2017-10-26 ENCOUNTER — Encounter: Payer: Self-pay | Admitting: Family Medicine

## 2017-10-26 ENCOUNTER — Ambulatory Visit (INDEPENDENT_AMBULATORY_CARE_PROVIDER_SITE_OTHER): Payer: Medicare Other | Admitting: Family Medicine

## 2017-10-26 VITALS — BP 99/43 | HR 75 | Ht 71.0 in | Wt 260.0 lb

## 2017-10-26 DIAGNOSIS — J449 Chronic obstructive pulmonary disease, unspecified: Secondary | ICD-10-CM

## 2017-10-26 DIAGNOSIS — I10 Essential (primary) hypertension: Secondary | ICD-10-CM | POA: Diagnosis not present

## 2017-10-26 DIAGNOSIS — R2681 Unsteadiness on feet: Secondary | ICD-10-CM | POA: Diagnosis not present

## 2017-10-26 DIAGNOSIS — E119 Type 2 diabetes mellitus without complications: Secondary | ICD-10-CM

## 2017-10-26 DIAGNOSIS — E039 Hypothyroidism, unspecified: Secondary | ICD-10-CM | POA: Diagnosis not present

## 2017-10-26 LAB — POCT UA - MICROALBUMIN
Albumin/Creatinine Ratio, Urine, POC: <30
Creatinine, POC: 200 mg/dL
Microalbumin Ur, POC: 10 mg/L

## 2017-10-26 LAB — POCT GLYCOSYLATED HEMOGLOBIN (HGB A1C): Hemoglobin A1C: 5.9 % — AB (ref 4.0–5.6)

## 2017-10-26 NOTE — Progress Notes (Signed)
Subjective:    CC: BP, DM   HPI: Hypertension- Pt denies chest pain, SOB, dizziness, or heart palpitations.  Taking meds as directed w/o problems.  Denies medication side effects.    Diabetes - no hypoglycemic events. No wounds or sores that are not healing well. No increased thirst or urination. Checking glucose at home. Taking medications as prescribed without any side effects.  He did bring in his new glucometer that he got.  He did want some assistance in setting it up today but has used it a few times.  Last 30-day average was 163.  COPD- he is on the [place].  He is doing well.    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  Fortunately it was noncancerous.  He has had a therapist coming out who is been helping him with his balance and says that she is been doing a good job.  He does have a scheduled follow-up with the surgeon next week I believe.  Hypothyroidism-he supposed be taking an extra half a tab on Saturdays and a whole tab the other 6 days.  He is due to recheck TSH level.  Past medical history, Surgical history, Family history not pertinant except as noted below, Social history, Allergies, and medications have been entered into the medical record, reviewed, and corrections made.   Review of Systems: No fevers, chills, night sweats, weight loss, chest pain, or shortness of breath.   Objective:    General: Well Developed, well nourished, and in no acute distress.  Neuro: Alert and oriented x3, extra-ocular muscles intact, sensation grossly intact.  HEENT: Normocephalic, atraumatic  Skin: Warm and dry, no rashes. Cardiac: Regular rate and rhythm, no murmurs rubs or gallops, no lower extremity edema.  Respiratory: Clear to auscultation bilaterally. Not using accessory muscles, speaking in full sentences.   Impression and Recommendations:    HTN -blood pressure is actually a little bit low today but he is actually feeling well.  He does not feel  lightheaded or dizzy.  He has a home blood pressure cuff and says he will check it again later today at home.  I did encourage him to drink more water and fluids today if it still under 115 that I want him to skip his Lasix tomorrow.  Right now he is just taking it on Mondays Wednesdays and Fridays.  DM-controlled.  Hemoglobin A1c of 5.9 today which is absolutely fantastic.  He did get his new glucometer and we helped him set it up today.  Please bring it with him when he comes at next office visit.  COPD - STable. No recent changes.   Hypothyroidism - due to recheck TSH.  He is not actually taking the extra half a tab a day.   Gait instability-doing well with physical therapy.

## 2017-10-27 LAB — TSH: TSH: 1.67 mIU/L (ref 0.40–4.50)

## 2017-10-29 DIAGNOSIS — Z9861 Coronary angioplasty status: Secondary | ICD-10-CM | POA: Diagnosis not present

## 2017-10-29 DIAGNOSIS — E039 Hypothyroidism, unspecified: Secondary | ICD-10-CM | POA: Diagnosis not present

## 2017-10-29 DIAGNOSIS — I2510 Atherosclerotic heart disease of native coronary artery without angina pectoris: Secondary | ICD-10-CM | POA: Diagnosis not present

## 2017-10-29 DIAGNOSIS — J449 Chronic obstructive pulmonary disease, unspecified: Secondary | ICD-10-CM | POA: Diagnosis not present

## 2017-10-29 DIAGNOSIS — D374 Neoplasm of uncertain behavior of colon: Secondary | ICD-10-CM | POA: Diagnosis not present

## 2017-10-29 DIAGNOSIS — Z483 Aftercare following surgery for neoplasm: Secondary | ICD-10-CM | POA: Diagnosis not present

## 2017-10-29 DIAGNOSIS — G4733 Obstructive sleep apnea (adult) (pediatric): Secondary | ICD-10-CM | POA: Diagnosis not present

## 2017-10-29 DIAGNOSIS — E119 Type 2 diabetes mellitus without complications: Secondary | ICD-10-CM | POA: Diagnosis not present

## 2017-10-29 DIAGNOSIS — Z7982 Long term (current) use of aspirin: Secondary | ICD-10-CM | POA: Diagnosis not present

## 2017-10-29 DIAGNOSIS — Z7984 Long term (current) use of oral hypoglycemic drugs: Secondary | ICD-10-CM | POA: Diagnosis not present

## 2017-11-03 ENCOUNTER — Telehealth: Payer: Self-pay

## 2017-11-03 DIAGNOSIS — D509 Iron deficiency anemia, unspecified: Secondary | ICD-10-CM

## 2017-11-03 NOTE — Telephone Encounter (Signed)
Bryan Hatfield called to see if he needs to continue taking iron supplements. Please advise.

## 2017-11-03 NOTE — Telephone Encounter (Signed)
I apologize.  I forgot to order a CBC and ferritin.  Would he be able to come next week and have that drawn. Dx - iron def anemia.

## 2017-11-04 DIAGNOSIS — D509 Iron deficiency anemia, unspecified: Secondary | ICD-10-CM | POA: Diagnosis not present

## 2017-11-04 NOTE — Telephone Encounter (Signed)
Patient advised and labs have been ordered.

## 2017-11-05 LAB — CBC WITH DIFFERENTIAL/PLATELET
Basophils Absolute: 20 cells/uL (ref 0–200)
Basophils Relative: 0.3 %
Eosinophils Absolute: 409 cells/uL (ref 15–500)
Eosinophils Relative: 6.2 %
HEMATOCRIT: 39.6 % (ref 38.5–50.0)
Hemoglobin: 12.9 g/dL — ABNORMAL LOW (ref 13.2–17.1)
LYMPHS ABS: 2086 {cells}/uL (ref 850–3900)
MCH: 29.7 pg (ref 27.0–33.0)
MCHC: 32.6 g/dL (ref 32.0–36.0)
MCV: 91.2 fL (ref 80.0–100.0)
MPV: 10.1 fL (ref 7.5–12.5)
Monocytes Relative: 6.7 %
NEUTROS PCT: 55.2 %
Neutro Abs: 3643 cells/uL (ref 1500–7800)
Platelets: 204 10*3/uL (ref 140–400)
RBC: 4.34 10*6/uL (ref 4.20–5.80)
RDW: 13.6 % (ref 11.0–15.0)
Total Lymphocyte: 31.6 %
WBC: 6.6 10*3/uL (ref 3.8–10.8)
WBCMIX: 442 {cells}/uL (ref 200–950)

## 2017-11-05 LAB — FERRITIN: Ferritin: 51 ng/mL (ref 20–380)

## 2017-12-08 ENCOUNTER — Other Ambulatory Visit: Payer: Self-pay | Admitting: Family Medicine

## 2017-12-13 NOTE — Telephone Encounter (Signed)
[facility] requesting [facility] on Zolpidem  Last RX sent 10-18-17 for #30 with 1 RF   RX pended. Please send if appropriate  Thanks!

## 2017-12-19 ENCOUNTER — Other Ambulatory Visit: Payer: Self-pay | Admitting: Family Medicine

## 2018-01-16 ENCOUNTER — Other Ambulatory Visit: Payer: Self-pay | Admitting: Family Medicine

## 2018-01-18 ENCOUNTER — Other Ambulatory Visit: Payer: Self-pay | Admitting: Family Medicine

## 2018-02-27 ENCOUNTER — Other Ambulatory Visit: Payer: Self-pay | Admitting: Family Medicine

## 2018-02-28 ENCOUNTER — Encounter: Payer: Self-pay | Admitting: Family Medicine

## 2018-02-28 ENCOUNTER — Ambulatory Visit (INDEPENDENT_AMBULATORY_CARE_PROVIDER_SITE_OTHER): Payer: Medicare Other | Admitting: Family Medicine

## 2018-02-28 VITALS — BP 124/50 | HR 64 | Ht 71.0 in | Wt 270.0 lb

## 2018-02-28 DIAGNOSIS — J449 Chronic obstructive pulmonary disease, unspecified: Secondary | ICD-10-CM

## 2018-02-28 DIAGNOSIS — K068 Other specified disorders of gingiva and edentulous alveolar ridge: Secondary | ICD-10-CM

## 2018-02-28 DIAGNOSIS — I10 Essential (primary) hypertension: Secondary | ICD-10-CM

## 2018-02-28 DIAGNOSIS — E119 Type 2 diabetes mellitus without complications: Secondary | ICD-10-CM

## 2018-02-28 DIAGNOSIS — F411 Generalized anxiety disorder: Secondary | ICD-10-CM

## 2018-02-28 DIAGNOSIS — Z9989 Dependence on other enabling machines and devices: Secondary | ICD-10-CM

## 2018-02-28 DIAGNOSIS — E1169 Type 2 diabetes mellitus with other specified complication: Secondary | ICD-10-CM

## 2018-02-28 DIAGNOSIS — G4733 Obstructive sleep apnea (adult) (pediatric): Secondary | ICD-10-CM

## 2018-02-28 DIAGNOSIS — E669 Obesity, unspecified: Secondary | ICD-10-CM

## 2018-02-28 LAB — POCT GLYCOSYLATED HEMOGLOBIN (HGB A1C): HEMOGLOBIN A1C: 6 % — AB (ref 4.0–5.6)

## 2018-02-28 MED ORDER — AMOXICILLIN-POT CLAVULANATE 875-125 MG PO TABS: 1.0000 | ORAL_TABLET | Freq: Two times a day (BID) | ORAL | 0 refills | Status: DC

## 2018-02-28 NOTE — Progress Notes (Signed)
Subjective:    CC: HTN  HPI:  Hypertension- Pt denies chest pain, SOB, dizziness, or heart palpitations.  Taking meds as directed w/o problems.  Denies medication side effects.     Diabetes - no hypoglycemic events. No wounds or sores that are not healing well. No increased thirst or urination. Checking glucose at home. Taking medications as prescribed without any side effects.  F/U COPD - doing well . No recent flares.   F/U insomnia -currently on Ambien 10 mg daily he is needing refills.  Tolerating well without any side effects or excess sedation.  F/U depression and GAD - He is doing OK overall. He is happy with his current regimen.  ON Lexapro 10mg  daily.    He says for about a week to maybe a week and a half he has had some pain and swelling over his left lower gumline.  He does not remember any injury or trauma.  No drainage.  No fevers or chills.  Is been salt water gargling  Past medical history, Surgical history, Family history not pertinant except as noted below, Social history, Allergies, and medications have been entered into the medical record, reviewed, and corrections made.   Review of Systems: No fevers, chills, night sweats, weight loss, chest pain, or shortness of breath.   Objective:    General: Well Developed, well nourished, and in no acute distress.  Neuro: Alert and oriented x3, extra-ocular muscles intact, sensation grossly intact.  HEENT: Normocephalic, atraumatic  Skin: Warm and dry, no rashes. Cardiac: Regular rate and rhythm, no murmurs rubs or gallops, no lower extremity edema.  Respiratory: Clear to auscultation bilaterally. Not using accessory muscles, speaking in full sentences.   Impression and Recommendations:    HTN - Well controlled. Continue current regimen. Follow up in  4 months.   DM -hemoglobin A1c of 6.0 today.  Well-controlled.  Continue current regimen.  Follow-up in 4 months.  COPD - Stable. No recent flares  Insomnia -Ambien  refill sent today.  Tolerating well without any significant side effects.  OSA on CPAP-using it regularly and tolerating well.  Depression/Anxiety -PHQ-2 every 2 score of 0 today.  Doing well on his Lexapro.  Continue current regimen for now.  Left lower gum pain-clear etiology.  We will go ahead and treat with a round of antibiotics but if not improving then encouraged him to get back in with his dentist.  I do not see any induration or wounds or ulcerations etc. Ok to continue salt water gargles.

## 2018-02-28 NOTE — Telephone Encounter (Signed)
Routing to pcp.Elouise Munroe, [place]

## 2018-03-06 ENCOUNTER — Other Ambulatory Visit: Payer: Self-pay | Admitting: Family Medicine

## 2018-03-07 DIAGNOSIS — E1169 Type 2 diabetes mellitus with other specified complication: Secondary | ICD-10-CM | POA: Diagnosis not present

## 2018-03-07 DIAGNOSIS — I10 Essential (primary) hypertension: Secondary | ICD-10-CM | POA: Diagnosis not present

## 2018-03-08 LAB — LIPID PANEL
CHOL/HDL RATIO: 2.5 (calc) (ref <5.0)
CHOLESTEROL: 158 mg/dL (ref <200)
HDL: 62 mg/dL (ref >40)
LDL CHOLESTEROL (CALC): 80 mg/dL
NON-HDL CHOLESTEROL (CALC): 96 mg/dL (ref <130)
TRIGLYCERIDES: 79 mg/dL (ref <150)

## 2018-03-08 LAB — COMPLETE METABOLIC PANEL WITH GFR
AG RATIO: 1.4 (calc) (ref 1.0–2.5)
ALBUMIN MSPROF: 3.9 g/dL (ref 3.6–5.1)
ALT: 21 U/L (ref 9–46)
AST: 21 U/L (ref 10–35)
Alkaline phosphatase (APISO): 88 U/L (ref 40–115)
BILIRUBIN TOTAL: 0.5 mg/dL (ref 0.2–1.2)
BUN: 22 mg/dL (ref 7–25)
CALCIUM: 8.8 mg/dL (ref 8.6–10.3)
CO2: 32 mmol/L (ref 20–32)
Chloride: 102 mmol/L (ref 98–110)
Creat: 1.05 mg/dL (ref 0.70–1.11)
GFR, EST AFRICAN AMERICAN: 73 mL/min/{1.73_m2} (ref >=60)
GFR, Est Non African American: 63 mL/min/{1.73_m2} (ref >=60)
Globulin: 2.7 g/dL (calc) (ref 1.9–3.7)
Glucose, Bld: 119 mg/dL — ABNORMAL HIGH (ref 65–99)
POTASSIUM: 4.2 mmol/L (ref 3.5–5.3)
Sodium: 141 mmol/L (ref 135–146)
TOTAL PROTEIN: 6.6 g/dL (ref 6.1–8.1)

## 2018-03-08 NOTE — Progress Notes (Signed)
All labs are normal. 

## 2018-04-06 NOTE — Progress Notes (Deleted)
Subjective:   Bryan Hatfield is a 84 y.o. male who presents for Medicare Annual/Subsequent preventive examination.  Review of Systems:  No ROS.  Medicare Wellness Visit. Additional risk factors are reflected in the social history.    Sleep patterns:   Home Safety/Smoke Alarms: Feels safe in home. Smoke alarms in place.  Living environment;      Male:   CCS-  utd   PSA- No results found for: PSA      Objective:    Vitals: There were no vitals taken for this visit.  There is no height or weight on file to calculate BMI.  Advanced Directives 08/31/2017 08/31/2017 08/25/2017 05/06/2017 04/19/2017 07/17/2016 03/14/2016  Does Patient Have a Medical Advance Directive? Yes - _0   Type of Paramedic of [place];Living will - [place];Living will Healthcare Power of [place];Living will - [place]  Does patient want to make changes to medical advance directive? No - Patient declined No - Patient declined No - Patient declined - No - Patient declined - -  Copy of [place] in Chart? No - copy requested No - copy requested No - copy requested No - copy requested No - copy requested - -  Pre-existing out of facility DNR order (yellow form or pink MOST form) - - - - - - -    Tobacco Social History   Tobacco Use  Smoking Status Former Smoker  . Packs/day: 1.00  . Years: 10.00  . Pack years: 10.00  . Types: Cigarettes  . Last attempt to quit: 06/01/1982  . Years since quitting: 35.8  Smokeless Tobacco Never Used  Tobacco Comment   "quit smoking ~ 25 years ago; smoked ~ 1 pk/wk"     Counseling given: Not Answered Comment: "quit smoking ~ 25 years ago; smoked ~ 1 pk/wk"   Clinical Intake:                       Past Medical History:  Diagnosis Date  . Acute respiratory failure ([place]) 09/25/11   hypoxic  . Anxiety   . Arthritis   . Bronchitis    "seems like 2x/year"  . CAD (coronary artery disease)    a. PTCA 2001. b. f/u cath 05/2012 heavy calcification, no PCI required. c. f/u cath for recurrent sx 2017: stable mod CAD unchanged from prior, mild progression of small vessel disease, elevated LVEDP.  . Colon polyps   . COPD (chronic obstructive pulmonary disease) ([place])   . Diabetes mellitus without complication ([place])   . GERD (gastroesophageal reflux disease)   . High cholesterol   . Hyperglycemia   . Hypertension   . Hypothyroidism   . Peripheral vascular disease ([place])    patient denies knowledge of, but listed in his chart  . Pleural effusion    Parapneumonic ?r/t PNA s/p thoracentesis 2008  . Pneumonia    hx  . Sleep apnea    cpap    Past Surgical History:  Procedure Laterality Date  . CARDIAC CATHETERIZATION N/A 11/25/2015   Procedure: Left Heart Cath and Coronary Angiography;  Surgeon: Leonie Man, MD;  Location: [place] [facility];  Service: Cardiovascular;  Laterality: N/A;  . CATARACT EXTRACTION W/ INTRAOCULAR LENS  IMPLANT, BILATERAL  ~ 2006  . CHOLECYSTECTOMY N/A 10/11/2012   Procedure: LAPAROSCOPIC CHOLECYSTECTOMY WITH INTRAOPERATIVE CHOLANGIOGRAM;  Surgeon: Adin Hector, MD;  Location: [facility]  OR;  Service: General;  Laterality: N/A;  . COLONOSCOPY    . CORONARY ANGIOPLASTY  01/2000  . HEMORRHOID SURGERY  1960's  . KNEE ARTHROSCOPY Left 04/19/2017   Procedure: ARTHROSCOPY KNEE, SUBCHONDROPLASTY;  Surgeon: Dorna Leitz, MD;  Location: [place];  Service: Orthopedics;  Laterality: Left;  . KNEE ARTHROSCOPY WITH MEDIAL MENISECTOMY Left 04/19/2017   Procedure: KNEE ARTHROSCOPY WITH MEDIAL AND LATERAL MENISECTOMY;  Surgeon: Dorna Leitz, MD;  Location: [place];  Service: Orthopedics;  Laterality: Left;  . LAPAROSCOPIC RIGHT COLECTOMY N/A 08/31/2017   Procedure: LAPAROSCOPIC ASSISTED ASCENDING COLECTOMY;  Surgeon: Fanny Skates, MD;  Location: [place];  Service: General;  Laterality: N/A;   Family History  Problem  Relation Age of Onset  . Stroke Mother   . Arthritis Mother   . Asthma Mother   . Emphysema Mother   . Stroke Father   . Arthritis Sister   . Stroke Sister   . Heart attack Neg Hx    Social History   Socioeconomic History  . Marital status: Married    Spouse name: Not on file  . Number of children: Not on file  . Years of education: Not on file  . Highest education level: Not on file  Occupational History  . 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 Financial resource strain: Not on file  . Food insecurity:    Worry: Not on file    Inability: Not on file  . Transportation needs:    Medical: Not on file    Non-medical: Not on file  Tobacco Use  . Smoking status: Former Smoker    Packs/day: 1.00    Years: 10.00    Pack years: 10.00    Types: Cigarettes    Last attempt to quit: 06/01/1982    Years since quitting: 35.8  . Smokeless tobacco: Never Used  . Tobacco comment: "quit smoking ~ 25 years ago; smoked ~ 1 pk/wk"  Substance and Sexual Activity  . Alcohol use: No  . Drug use: No  . Sexual activity: Never  Lifestyle  . Physical activity:    Days per week: Not on file    Minutes per session: Not on file  . Stress: Not on file  Relationships  . Social connections:    Talks on phone: Not on file    Gets together: Not on file    Attends religious service: Not on file    Active member of club or organization: Not on file    Attends meetings of clubs or organizations: Not on file    Relationship status: Not on file  Other Topics Concern  . Not on file  Social History Narrative  . Not on file    Outpatient Encounter Medications as of 04/12/2018  Medication Sig  . albuterol (PROVENTIL HFA;VENTOLIN HFA) 108 (90 Base) MCG/ACT inhaler Inhale 2 puffs every 6 (six) hours as needed into the lungs for wheezing or shortness of breath.  . AMBULATORY NON FORMULARY MEDICATION Medication Name: 4 prong cane. Dx: gait instability  .  amoxicillin-clavulanate (AUGMENTIN) 875-125 MG tablet Take 1 tablet by mouth 2 (two) times daily.  . Ascorbic Acid (VITAMIN C PO) Take 1 tablet by mouth daily.  Marland Kitchen aspirin EC 81 MG tablet Take 81 mg by mouth daily.    Marland Kitchen atorvastatin (LIPITOR) 40 MG tablet TAKE 1 TABLET BY MOUTH AT  BEDTIME  . Blood Glucose Monitoring Suppl (ONE TOUCH ULTRA 2) w/Device KIT Onetouch  ultra kit. DX: E11.9  . escitalopram (LEXAPRO) 10 MG tablet TAKE 1 TABLET BY MOUTH  DAILY  . ferrous gluconate (FERGON) 324 MG tablet Take 1 tablet by mouth 2 (two) times daily before a meal.  . fluticasone (FLONASE) 50 MCG/ACT nasal spray One spray in each nostril twice a day, use left hand for right nostril, and right hand for left nostril.  . furosemide (LASIX) 40 MG tablet TAKE 1 TABLET BY MOUTH  EVERY OTHER DAY USUALLY ON  MONDAY,WEDNESDAY,FRIDAY  . glucose blood (ONE TOUCH ULTRA TEST) test strip For testing blood sugars twice daily.E11.9  . ipratropium-albuterol (DUONEB) 0.5-2.5 (3) MG/3ML SOLN USE 1 VIAL VIA NEBULIZER EVERY 2 HOURS AS NEEDED FOR WHEEZING, SHORTNESS OF BREATH.  . isosorbide mononitrate (IMDUR) 30 MG 24 hr tablet Take 30 mg by mouth daily.  Marland Kitchen levothyroxine (SYNTHROID, LEVOTHROID) 125 MCG tablet TAKE 1 TABLET(125 MCG) BY MOUTH DAILY BEFORE BREAKFAST  . metFORMIN (GLUCOPHAGE-XR) 500 MG 24 hr tablet TAKE 1 TABLET(500 MG) BY MOUTH DAILY WITH BREAKFAST  . Multiple Vitamins-Minerals (ICAPS AREDS 2) CAPS Take 1 capsule 2 (two) times daily by mouth.  . nitroGLYCERIN (NITROSTAT) 0.4 MG SL tablet DISSOLVE 1 TABLET UNDER THE TONGUE EVERY 5 MINUTES AS  NEEDED CHEST PAIN. NOT TO  EXCEED 3 TABLETS IN 15  MINUTES  . Tiotropium Bromide-Olodaterol (STIOLTO RESPIMAT) 2.5-2.5 MCG/ACT AERS Inhale 2 puffs into the lungs daily.  . traMADol (ULTRAM) 50 MG tablet Take 1 tablet (50 mg total) by mouth every 12 (twelve) hours as needed for moderate pain.  Marland Kitchen triamcinolone cream (KENALOG) 0.1 % APPLY EXTERNALLY TO THE AFFECTED AREA DAILY FOR UP  TO 2 WEEKS AS NEEDED. AVOID FACE (Patient taking differently: APPLY EXTERNALLY TO THE AFFECTED AREA DAILY FOR UP TO 2 WEEKS AS NEEDED FOR ITCHING . AVOID FACE)  . VESICARE 5 MG tablet Take 5 mg by mouth daily.  Marland Kitchen zolpidem (AMBIEN) 10 MG tablet TAKE 1 TABLET BY MOUTH EVERY DAY AT BEDTIME AS NEEDED FOR SLEEP   No facility-administered encounter medications on file as of 04/12/2018.     Activities of Daily Living In your present state of health, do you have any difficulty performing the following activities: 08/31/2017 08/25/2017  Hearing? N -  Vision? N -  Difficulty concentrating or making decisions? N -  Walking or climbing stairs? Y Y  Dressing or bathing? N -  Doing errands, shopping? N N  Some recent data might be hidden    Patient Care Team: [name], MD as PCP - General (Family Medicine) Belva Crome, MD as PCP - Cardiology (Cardiology)   Assessment:   This is a routine wellness examination for [place].Physical assessment deferred to PCP.   Exercise Activities and Dietary recommendations   Diet  Breakfast: Lunch:  Dinner:       Goals   None     Fall Risk Fall Risk  03/10/2017 04/28/2016 12/18/2013 08/30/2013  Falls in the past year? No No Yes No  Number falls in past yr: - - 1 -  Risk for fall due to : - - Impaired balance/gait -   Is the patient's home free of loose throw rugs in walkways, pet beds, electrical cords, etc?   {Blank single:19197::"yes","no"}      Grab bars in the bathroom? {Blank single:19197::"yes","no"}      Handrails on the stairs?   {Blank single:19197::"yes","no"}      Adequate lighting?   {Blank single:19197::"yes","no"}   Depression Screen PHQ 2/9 Scores 02/28/2018 10/26/2017  03/10/2017 12/03/2016  PHQ - 2 Score 0 3 0 2  PHQ- 9 Score - 10 - 6    Cognitive Function        Immunization History  Administered Date(s) Administered  . Influenza Split 01/31/2012  . Influenza Whole 03/16/2008, 03/01/2013  . Influenza, High Dose  Seasonal PF 03/09/2017  . Influenza, Seasonal, Injecte, Preservative Fre 02/28/2016  . Influenza,inj,Quad PF,6+ Mos 01/22/2015  . Influenza-Unspecified 01/27/2018  . Pneumococcal Conjugate-13 12/21/2013  . Pneumococcal Polysaccharide-23 03/15/2001  . Pneumococcal-Unspecified 01/31/2008  . Tdap 06/01/2005, 07/01/2011  . Zoster 06/01/2012    Screening Tests Health Maintenance  Topic Date Due  . OPHTHALMOLOGY EXAM  05/31/2018 (Originally 08/01/1936)  . HEMOGLOBIN A1C  08/29/2018  . URINE MICROALBUMIN  10/27/2018  . FOOT EXAM  03/01/2019  . TETANUS/TDAP  06/30/2021  . COLONOSCOPY  07/08/2022  . INFLUENZA VACCINE  Completed  . PNA vac Low Risk Adult  Completed        Plan:   ***  I have personally reviewed and noted the following in the patient's chart:   . Medical and social history . Use of alcohol, tobacco or illicit drugs  . Current medications and supplements . Functional ability and status . Nutritional status . Physical activity . Advanced directives . List of other physicians . Hospitalizations, surgeries, and ER visits in previous 12 months . Vitals . Screenings to include cognitive, depression, and falls . Referrals and appointments  In addition, I have reviewed and discussed with patient certain preventive protocols, quality metrics, and best practice recommendations. A written personalized care plan for preventive services as well as general preventive health recommendations were provided to patient.     Bryan Chars, LPN  16/06/958

## 2018-04-12 ENCOUNTER — Ambulatory Visit: Payer: Medicare Other

## 2018-04-15 ENCOUNTER — Other Ambulatory Visit: Payer: Self-pay | Admitting: Family Medicine

## 2018-05-09 ENCOUNTER — Emergency Department (INDEPENDENT_AMBULATORY_CARE_PROVIDER_SITE_OTHER)
Admission: EM | Admit: 2018-05-09 | Discharge: 2018-05-09 | Disposition: A | Discharge status: Home / Self Care | Payer: Medicare Other | Source: Home / Self Care

## 2018-05-09 ENCOUNTER — Other Ambulatory Visit: Payer: Self-pay

## 2018-05-09 ENCOUNTER — Encounter: Payer: Self-pay | Admitting: *Deleted

## 2018-05-09 DIAGNOSIS — L299 Pruritus, unspecified: Secondary | ICD-10-CM | POA: Diagnosis not present

## 2018-05-09 DIAGNOSIS — L309 Dermatitis, unspecified: Secondary | ICD-10-CM | POA: Diagnosis not present

## 2018-05-09 DIAGNOSIS — E11620 Type 2 diabetes mellitus with diabetic dermatitis: Secondary | ICD-10-CM

## 2018-05-09 DIAGNOSIS — R21 Rash and other nonspecific skin eruption: Secondary | ICD-10-CM

## 2018-05-09 MED ORDER — PREDNISONE 10 MG PO TABS: ORAL_TABLET | ORAL | 0 refills | Status: DC

## 2018-05-09 MED ORDER — METHYLPREDNISOLONE ACETATE 80 MG/ML IJ SUSP
80.0000 mg | Freq: Once | INTRAMUSCULAR | Status: AC
  Administered 2018-05-09: 80 mg via INTRAMUSCULAR

## 2018-05-09 NOTE — Discharge Instructions (Addendum)
Prednisone 10 WE:XHBZ 3 daily for 3 days, then 2 daily for 3 days, then 1 daily for 3 days.  This will raise your blood sugar level while you are on it.  Take over-the-counter Zyrtec daily to see if that will help your itching.  You can continue to use the triamcinolone cream that you were given.  If you do not improve you will need to see a dermatologist.  Contact us for a referral if you cannot get on into the dermatology office.  Follow-up at the [place], here, or with your primary care as needed.

## 2018-05-09 NOTE — ED Provider Notes (Signed)
[facility]    CSN: [id number] Arrival date & time: 05/09/18  1104     History   Chief Complaint No chief complaint on file.   HPI Bryan Hatfield is a 84 y.o. male.   HPI Patient has been going to the [place] hospital over the last couple of months for a rash which she has generalized on his body.  He has itching scattered all over himself.  They are little patches.  He itches terribly.  He is tried taking a pill which sounds like it probably was hydroxyzine.  He also has used some triamcinolone cream.  He went back to the [place] this morning having been told he would see a dermatologist but it was just the same primary care so he came here for another opinion. Past Medical History:  Diagnosis Date  . Acute respiratory failure ([place]) 09/25/11   hypoxic  . Anxiety   . Arthritis   . Bronchitis    "seems like 2x/year"  . CAD (coronary artery disease)    a. PTCA 2001. b. f/u cath 05/2012 heavy calcification, no PCI required. c. f/u cath for recurrent sx 2017: stable mod CAD unchanged from prior, mild progression of small vessel disease, elevated LVEDP.  . Colon polyps   . COPD (chronic obstructive pulmonary disease) ([place])   . Diabetes mellitus without complication ([place])   . GERD (gastroesophageal reflux disease)   . High cholesterol   . Hyperglycemia   . Hypertension   . Hypothyroidism   . Peripheral vascular disease ([place])    patient denies knowledge of, but listed in his chart  . Pleural effusion    Parapneumonic ?r/t PNA s/p thoracentesis 2008  . Pneumonia    hx  . Sleep apnea    cpap     Patient Active Problem List   Diagnosis Date Noted  . CAD (coronary artery disease) 09/02/2017  . Peripheral vascular disease ([place]) 09/02/2017  . Acute kidney injury ([place]) 09/02/2017  . Acute blood loss anemia 09/02/2017  . COPD (chronic obstructive pulmonary disease) ([place]) 09/02/2017  . Hypothyroidism 09/02/2017  . High cholesterol 09/02/2017  . Diabetes mellitus type 2 in obese  ([place]) 09/02/2017  . Class 2 obesity with body mass index (BMI) of 36.0 to 36.9 in adult 09/02/2017  . Chronic diastolic heart failure ([place]) 09/02/2017  . OSA on CPAP 09/02/2017  . Thrombocytopenia ([place]) 09/02/2017  . Mass of hepatic flexure of colon 08/31/2017  . 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 Acute medial meniscus tear of left knee 04/19/2017  . Primary osteoarthritis of left knee 03/01/2017  . GAD (generalized anxiety disorder) 11/21/2014  . Arterial hypotension 09/17/2014  . Hypothyroid 09/07/2014  . Other abnormal glucose 06/08/2013  . Essential hypertension, benign 06/02/2013  . Hyperlipidemia 06/02/2013  . Depression 06/02/2013  . Major depressive disorder, single episode 06/02/2013  . Coronary artery disease involving native coronary artery of native heart with angina pectoris ([place]) 05/09/2012  . Morbid obesity ([place]) 09/25/2011  . COPD GOLD 0/I 09/21/2007  . Sleep apnea 09/20/2007    Past Surgical History:  Procedure Laterality Date  . CARDIAC CATHETERIZATION N/A 11/25/2015   Procedure: Left Heart Cath and Coronary Angiography;  Surgeon: Leonie Man, MD;  Location: [place] [facility];  Service: Cardiovascular;  Laterality: N/A;  . CATARACT EXTRACTION W/ INTRAOCULAR LENS  IMPLANT, BILATERAL  ~ 2006  . CHOLECYSTECTOMY N/A 10/11/2012   Procedure: LAPAROSCOPIC CHOLECYSTECTOMY WITH INTRAOPERATIVE CHOLANGIOGRAM;  Surgeon: Adin Hector, MD;  Location: [facility];  Service: General;  Laterality: N/A;  . COLONOSCOPY    . CORONARY ANGIOPLASTY  01/2000  . HEMORRHOID SURGERY  1960's  . KNEE ARTHROSCOPY Left 04/19/2017   Procedure: ARTHROSCOPY KNEE, SUBCHONDROPLASTY;  Surgeon: Dorna Leitz, MD;  Location: [place];  Service: Orthopedics;  Laterality: Left;  . KNEE ARTHROSCOPY WITH MEDIAL MENISECTOMY Left 04/19/2017   Procedure: KNEE ARTHROSCOPY WITH MEDIAL AND LATERAL MENISECTOMY;  Surgeon: Dorna Leitz, MD;  Location: [facility];  Service: Orthopedics;  Laterality:  Left;  . LAPAROSCOPIC RIGHT COLECTOMY N/A 08/31/2017   Procedure: LAPAROSCOPIC ASSISTED ASCENDING COLECTOMY;  Surgeon: Fanny Skates, MD;  Location: [place];  Service: General;  Laterality: N/A;       Home Medications    Prior to Admission medications   Medication Sig Start Date End Date Taking? Authorizing Provider  albuterol (PROVENTIL HFA;VENTOLIN HFA) 108 (90 Base) MCG/ACT inhaler Inhale 2 puffs every 6 (six) hours as needed into the lungs for wheezing or shortness of breath.    [provider]  AMBULATORY NON FORMULARY MEDICATION Medication Name: 4 prong cane. Dx: gait instability 03/09/17   [name], MD  amoxicillin-clavulanate (AUGMENTIN) 875-125 MG tablet Take 1 tablet by mouth 2 (two) times daily. 02/28/18   [name], MD  Ascorbic Acid (VITAMIN C PO) Take 1 tablet by mouth daily.    [provider]  aspirin EC 81 MG tablet Take 81 mg by mouth daily.      [provider]  atorvastatin (LIPITOR) 40 MG tablet TAKE 1 TABLET BY MOUTH AT  BEDTIME 12/20/17   [name], MD  Blood Glucose Monitoring Suppl (ONE TOUCH ULTRA 2) w/Device KIT Onetouch ultra kit. DX: E11.9 09/21/17   [name], MD  escitalopram (LEXAPRO) 10 MG tablet TAKE 1 TABLET BY MOUTH  DAILY 03/07/18   [name], MD  ferrous gluconate (FERGON) 324 MG tablet Take 1 tablet by mouth 2 (two) times daily before a meal. 09/06/17   [provider]  fluticasone (FLONASE) 50 MCG/ACT nasal spray One spray in each nostril twice a day, use left hand for right nostril, and right hand for left nostril. 04/21/16   Silverio Decamp, MD  furosemide (LASIX) 40 MG tablet TAKE 1 TABLET BY MOUTH  EVERY OTHER DAY USUALLY ON  MONDAY,WEDNESDAY,FRIDAY 01/17/18   [name], MD  glucose blood (ONE TOUCH ULTRA TEST) test strip For testing blood sugars twice daily.E11.9 09/29/17   [name], MD  ipratropium-albuterol (DUONEB) 0.5-2.5 (3)  MG/3ML SOLN USE 1 VIAL VIA NEBULIZER EVERY 2 HOURS AS NEEDED FOR WHEEZING, SHORTNESS OF BREATH. 02/12/17   [name], MD  isosorbide mononitrate (IMDUR) 30 MG 24 hr tablet Take 30 mg by mouth daily.    [provider]  levothyroxine (SYNTHROID, LEVOTHROID) 125 MCG tablet TAKE 1 TABLET BY MOUTH  DAILY BEFORE BREAKFAST 04/15/18   [name], MD  metFORMIN (GLUCOPHAGE-XR) 500 MG 24 hr tablet TAKE 1 TABLET(500 MG) BY MOUTH DAILY WITH BREAKFAST 01/19/18   [name], MD  Multiple Vitamins-Minerals (ICAPS AREDS 2) CAPS Take 1 capsule 2 (two) times daily by mouth.    [provider]  nitroGLYCERIN (NITROSTAT) 0.4 MG SL tablet DISSOLVE 1 TABLET UNDER THE TONGUE EVERY 5 MINUTES AS  NEEDED CHEST PAIN. NOT TO  EXCEED 3 TABLETS IN 15  MINUTES 09/24/17   [name], MD  Tiotropium Bromide-Olodaterol (STIOLTO RESPIMAT) 2.5-2.5 MCG/ACT AERS Inhale 2 puffs into the lungs  daily. 03/09/17   [name], MD  traMADol (ULTRAM) 50 MG tablet Take 1 tablet (50 mg total) by mouth every 12 (twelve) hours as needed for moderate pain. 09/04/17   Fanny Skates, MD  triamcinolone cream (KENALOG) 0.1 % APPLY EXTERNALLY TO THE AFFECTED AREA DAILY FOR UP TO 2 WEEKS AS NEEDED. AVOID FACE Patient taking differently: APPLY EXTERNALLY TO THE AFFECTED AREA DAILY FOR UP TO 2 WEEKS AS NEEDED FOR ITCHING . AVOID FACE 03/22/17   [name], MD  VESICARE 5 MG tablet Take 5 mg by mouth daily. 06/12/16   [provider]  zolpidem (AMBIEN) 10 MG tablet TAKE 1 TABLET BY MOUTH EVERY DAY AT BEDTIME AS NEEDED FOR SLEEP 02/28/18   [name], MD    Family History Family History  Problem Relation Age of Onset  . Stroke Mother   . Arthritis Mother   . Asthma Mother   . Emphysema Mother   . Stroke Father   . Arthritis Sister   . Stroke Sister   . Heart attack Neg Hx     Social History Social History   Tobacco Use  . Smoking status: Former  Smoker    Packs/day: 1.00    Years: 10.00    Pack years: 10.00    Types: Cigarettes    Last attempt to quit: 06/01/1982    Years since quitting: 35.9  . Smokeless tobacco: Never Used  . Tobacco comment: "quit smoking ~ 25 years ago; smoked ~ 1 pk/wk"  Substance Use Topics  . Alcohol use: No  . Drug use: No     Allergies   Patient has no known allergies.   Review of Systems Review of Systems   Physical Exam Triage Vital Signs ED Triage Vitals  Enc Vitals Group     BP      Pulse      Resp      Temp      Temp src      SpO2      Weight      Height      Head Circumference      Peak Flow      Pain Score      Pain Loc      Pain Edu?      Excl. in [place]?    No data found.  Updated Vital Signs There were no vitals taken for this visit.  Visual Acuity Right Eye Distance:   Left Eye Distance:   Bilateral Distance:    Right Eye Near:   Left Eye Near:    Bilateral Near:     Physical Exam No other new problems.  The patient is diabetic.  No new medications.  UC Treatments / Results  Labs (all labs ordered are listed, but only abnormal results are displayed) Labs Reviewed - No data to display  EKG None  Radiology No results found.  Procedures Procedures (including critical care time) Skin scraping Medications Ordered in UC Medications - No data to display  Initial Impression / Assessment and Plan / UC Course  I have reviewed the triage vital signs and the nursing notes.  Pertinent labs & imaging results that were available during my care of the patient were reviewed by me and considered in my medical decision making (see chart for details).     Atopic appearing eczematoid dermatitis.  Will give some cortisone and see how he does.  Advised Zyrtec.  We will check chemistries to make sure  he has not developed more hypothyroidism or some other metabolic imbalance causing the itching. Final Clinical Impressions(s) / UC Diagnoses   Final diagnoses:  None     Discharge Instructions   None    ED Prescriptions    None     Controlled Substance Prescriptions [place] Controlled Substance Registry consulted? No   Posey Boyer, MD 05/09/18 1242

## 2018-05-09 NOTE — ED Triage Notes (Signed)
Pt c/o rash all over his body x 2 mths. He has seen the [facility] 5 times for the rash. He has used Triamcinlone cream for over one month. The rash itches, but denies pain.

## 2018-05-10 ENCOUNTER — Telehealth: Payer: Self-pay

## 2018-05-10 LAB — COMPLETE METABOLIC PANEL WITH GFR
AG RATIO: 1.4 (calc) (ref 1.0–2.5)
ALBUMIN MSPROF: 3.9 g/dL (ref 3.6–5.1)
ALT: 20 U/L (ref 9–46)
AST: 24 U/L (ref 10–35)
Alkaline phosphatase (APISO): 87 U/L (ref 40–115)
BILIRUBIN TOTAL: 0.4 mg/dL (ref 0.2–1.2)
BUN: 23 mg/dL (ref 7–25)
CO2: 28 mmol/L (ref 20–32)
CREATININE: 1.01 mg/dL (ref 0.70–1.11)
Calcium: 8.9 mg/dL (ref 8.6–10.3)
Chloride: 104 mmol/L (ref 98–110)
GFR, EST NON AFRICAN AMERICAN: 66 mL/min/{1.73_m2} (ref >=60)
GFR, Est African American: 76 mL/min/{1.73_m2} (ref >=60)
GLUCOSE: 100 mg/dL — AB (ref 65–99)
Globulin: 2.7 g/dL (calc) (ref 1.9–3.7)
POTASSIUM: 4.7 mmol/L (ref 3.5–5.3)
SODIUM: 140 mmol/L (ref 135–146)
Total Protein: 6.6 g/dL (ref 6.1–8.1)

## 2018-05-10 LAB — TSH: TSH: 2.66 mIU/L (ref 0.40–4.50)

## 2018-05-10 NOTE — Telephone Encounter (Signed)
Spoke with wife and she will have patient call back for results.

## 2018-05-11 DIAGNOSIS — K625 Hemorrhage of anus and rectum: Secondary | ICD-10-CM | POA: Diagnosis not present

## 2018-05-11 DIAGNOSIS — Z86010 Personal history of colonic polyps: Secondary | ICD-10-CM | POA: Diagnosis not present

## 2018-05-31 ENCOUNTER — Ambulatory Visit (INDEPENDENT_AMBULATORY_CARE_PROVIDER_SITE_OTHER): Payer: Medicare Other | Admitting: Physician Assistant

## 2018-05-31 ENCOUNTER — Encounter: Payer: Self-pay | Admitting: Physician Assistant

## 2018-05-31 VITALS — BP 140/60 | HR 86 | Temp 98.4°F | Ht 71.0 in | Wt 277.0 lb

## 2018-05-31 DIAGNOSIS — R05 Cough: Secondary | ICD-10-CM | POA: Diagnosis not present

## 2018-05-31 DIAGNOSIS — J0100 Acute maxillary sinusitis, unspecified: Secondary | ICD-10-CM | POA: Diagnosis not present

## 2018-05-31 DIAGNOSIS — R059 Cough, unspecified: Secondary | ICD-10-CM

## 2018-05-31 MED ORDER — FLUTICASONE PROPIONATE 50 MCG/ACT NA SUSP: NASAL | 3 refills | Status: AC

## 2018-05-31 MED ORDER — BENZONATATE 200 MG PO CAPS: 200.0000 mg | ORAL_CAPSULE | Freq: Two times a day (BID) | ORAL | 0 refills | Status: DC | PRN

## 2018-05-31 NOTE — Progress Notes (Signed)
Subjective:    Patient ID: Bryan Hatfield, male    DOB: 10/03/1926, 84 y.o.   MRN: [id number]  HPI  Pt is a 84 yo male with COPD who presents to the clinic with 3 days of cough, congestion, sinus drainage. Pt denies any fever, chills, wheezing. He has his normal SOB. Not tried anything OTC. Using inhalers as directed. No sick contacts.    Pt is a former smoker.   .. Active Ambulatory Problems    Diagnosis Date Noted  . Sleep apnea 09/20/2007  . COPD GOLD 0/I 09/21/2007  . Morbid obesity ([place]) 09/25/2011  . Coronary artery disease involving native coronary artery of native heart with angina pectoris ([place]) 05/09/2012  . Essential hypertension, benign 06/02/2013  . Hyperlipidemia 06/02/2013  . Depression 06/02/2013  . Hypothyroid 09/07/2014  . Arterial hypotension 09/17/2014  . GAD (generalized anxiety disorder) 11/21/2014  . Primary osteoarthritis of left knee 03/01/2017  . Acute medial meniscus tear of left knee 04/19/2017  . Major depressive disorder, single episode 06/02/2013  . Other abnormal glucose 06/08/2013  . 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 Mass of hepatic flexure of colon 08/31/2017  . CAD (coronary artery disease) 09/02/2017  . Peripheral vascular disease ([place]) 09/02/2017  . Acute kidney injury ([place]) 09/02/2017  . Acute blood loss anemia 09/02/2017  . COPD (chronic obstructive pulmonary disease) ([place]) 09/02/2017  . Hypothyroidism 09/02/2017  . High cholesterol 09/02/2017  . Diabetes mellitus type 2 in obese ([place]) 09/02/2017  . Class 2 obesity with body mass index (BMI) of 36.0 to 36.9 in adult 09/02/2017  . Chronic diastolic heart failure ([place]) 09/02/2017  . OSA on CPAP 09/02/2017  . Thrombocytopenia ([place]) 09/02/2017   Resolved Ambulatory Problems    Diagnosis Date Noted  . INSOMNIA UNSPECIFIED 09/21/2007  . Abnormality of gait 09/21/2007  . Acute and chronic respiratory failure with hypoxia ([place]) 09/25/2011  . Leg swelling 09/25/2011  .  Unstable angina ([place]) 05/08/2012  . Abnormal nuclear stress test 05/31/2012  . Dyspnea 05/31/2012  . Gallstones and inflammation of gallbladder without obstruction 09/23/2012  . Prediabetes 06/08/2013  . Episode of dizziness 08/30/2014  . Weakness 09/17/2014  . Erythrasma 09/18/2014  . Thyroid activity decreased 11/21/2014  . Insomnia 02/19/2015  . Dyspnea 05/29/2015  . Viral gastroenteritis 03/20/2016  . Cough 04/21/2016  . Fracture of medial portion of left tibial plateau 04/19/2017  . Diabetes mellitus without complication ([place]) 35/57/3220  . HTN (hypertension) 09/02/2017   Past Medical History:  Diagnosis Date  . Acute respiratory failure ([place]) 09/25/11  . Anxiety   . Arthritis   . Bronchitis   . Colon polyps   . GERD (gastroesophageal reflux disease)   . Hyperglycemia   . Hypertension   . Pleural effusion   . Pneumonia      Review of Systems See HPI.     Objective:   Physical Exam Vitals signs reviewed.  Constitutional:      Appearance: Normal appearance.  HENT:     Head: Normocephalic and atraumatic.     Right Ear: Tympanic membrane and ear canal normal.     Left Ear: Tympanic membrane and ear canal normal.     Nose: Congestion present.     Mouth/Throat:     Mouth: Mucous membranes are moist.     Pharynx: No oropharyngeal exudate.  Eyes:     Pupils: Pupils are equal, round, and reactive to light.  Cardiovascular:     Rate and Rhythm: Normal rate  and regular rhythm.     Pulses: Normal pulses.     Heart sounds: Normal heart sounds.  Pulmonary:     Effort: Pulmonary effort is normal.     Breath sounds: Normal breath sounds. No wheezing.  Neurological:     General: No focal deficit present.     Mental Status: He is alert and oriented to person, place, and time.  Psychiatric:        Mood and Affect: Mood normal.        Behavior: Behavior normal.           Assessment & Plan:  Marland KitchenMarland KitchenBritt was seen today for cough.  Diagnoses and all orders for this  visit:  Acute non-recurrent maxillary sinusitis  Cough -     fluticasone (FLONASE) 50 MCG/ACT nasal spray; One spray in each nostril twice a day, use left hand for right nostril, and right hand for left nostril. -     benzonatate (TESSALON) 200 MG capsule; Take 1 capsule (200 mg total) by mouth 2 (two) times daily as needed for cough.  vitals reassuring.   His lungs sound really good despite having COPD. I think cough coming from sinus drainage. It has only been 3 days and he has not tried anything OTC. Given flonase and tessalon pearls. Recommended him to tylenol cold sinus severe to take for another 3 days. If not feeling better or worsening by Friday call the office back and will consider abx. HO given. Rest and hydrate. If SOB worsens consider prednisone.

## 2018-05-31 NOTE — Patient Instructions (Signed)
Tylenol cold sinus severe.  Take with flonase 2 sprays each nostril.  Tessalon pearls for cough.    Sinusitis, Adult Sinusitis is soreness and swelling (inflammation) of your sinuses. Sinuses are hollow spaces in the bones around your face. They are located:  Around your eyes.  In the middle of your forehead.  Behind your nose.  In your cheekbones. Your sinuses and nasal passages are lined with a fluid called mucus. Mucus drains out of your sinuses. Swelling can trap mucus in your sinuses. This lets germs (bacteria, virus, or fungus) grow, which leads to infection. Most of the time, this condition is caused by a virus. What are the causes? This condition is caused by:  Allergies.  Asthma.  Germs.  Things that block your nose or sinuses.  Growths in the nose (nasal polyps).  Chemicals or irritants in the air.  Fungus (rare). What increases the risk? You are more likely to develop this condition if:  You have a weak body defense system (immune system).  You do a lot of swimming or diving.  You use nasal sprays too much.  You smoke. What are the signs or symptoms? The main symptoms of this condition are pain and a feeling of pressure around the sinuses. Other symptoms include:  Stuffy nose (congestion).  Runny nose (drainage).  Swelling and warmth in the sinuses.  Headache.  Toothache.  A cough that may get worse at night.  Mucus that collects in the throat or the back of the nose (postnasal drip).  Being unable to smell and taste.  Being very tired (fatigue).  A fever.  Sore throat.  Bad breath. How is this diagnosed? This condition is diagnosed based on:  Your symptoms.  Your medical history.  A physical exam.  Tests to find out if your condition is short-term (acute) or long-term (chronic). Your doctor may: ? Check your nose for growths (polyps). ? Check your sinuses using a tool that has a light (endoscope). ? Check for allergies or  germs. ? Do imaging tests, such as an MRI or CT scan. How is this treated? Treatment for this condition depends on the cause and whether it is short-term or long-term.  If caused by a virus, your symptoms should go away on their own within 10 days. You may be given medicines to relieve symptoms. They include: ? Medicines that shrink swollen tissue in the nose. ? Medicines that treat allergies (antihistamines). ? A spray that treats swelling of the nostrils. ? Rinses that help get rid of thick mucus in your nose (nasal saline washes).  If caused by bacteria, your doctor may wait to see if you will get better without treatment. You may be given antibiotic medicine if you have: ? A very bad infection. ? A weak body defense system.  If caused by growths in the nose, you may need to have surgery. Follow these instructions at home: Medicines  Take, use, or apply over-the-counter and prescription medicines only as told by your doctor. These may include nasal sprays.  If you were prescribed an antibiotic medicine, take it as told by your doctor. Do not stop taking the antibiotic even if you start to feel better. Hydrate and humidify   Drink enough water to keep your pee (urine) pale yellow.  Use a cool mist humidifier to keep the humidity level in your home above 50%.  Breathe in steam for 10-15 minutes, 3-4 times a day, or as told by your doctor. You can do this  in the bathroom while a hot shower is running.  Try not to spend time in cool or dry air. Rest  Rest as much as you can.  Sleep with your head raised (elevated).  Make sure you get enough sleep each night. General instructions   Put a warm, moist washcloth on your face 3-4 times a day, or as often as told by your doctor. This will help with discomfort.  Wash your hands often with soap and water. If there is no soap and water, use hand sanitizer.  Do not smoke. Avoid being around people who are smoking (secondhand  smoke).  Keep all follow-up visits as told by your doctor. This is important. Contact a doctor if:  You have a fever.  Your symptoms get worse.  Your symptoms do not get better within 10 days. Get help right away if:  You have a very bad headache.  You cannot stop throwing up (vomiting).  You have very bad pain or swelling around your face or eyes.  You have trouble seeing.  You feel confused.  Your neck is stiff.  You have trouble breathing. Summary  Sinusitis is swelling of your sinuses. Sinuses are hollow spaces in the bones around your face.  This condition is caused by tissues in your nose that become inflamed or swollen. This traps germs. These can lead to infection.  If you were prescribed an antibiotic medicine, take it as told by your doctor. Do not stop taking it even if you start to feel better.  Keep all follow-up visits as told by your doctor. This is important. This information is not intended to replace advice given to you by your health care provider. Make sure you discuss any questions you have with your health care provider. Document Released: 11/04/2007 Document Revised: 10/18/2017 Document Reviewed: 10/18/2017 Elsevier Interactive Patient Education  2019 [employer].

## 2018-06-02 ENCOUNTER — Telehealth: Payer: Self-pay | Admitting: Family Medicine

## 2018-06-02 NOTE — Telephone Encounter (Signed)
[facility] just opened a new office here in [place] and its right off of [address].  They do a good job.

## 2018-06-02 NOTE — Telephone Encounter (Signed)
Wife, Bryan Hatfield, wanting to know where Dr.Metheney thinks it would be best for patient to be seen by a dermatologist. Please advise.

## 2018-06-03 NOTE — Telephone Encounter (Addendum)
Al called and states the Dermatology is booking 3 months out. He would like to see someone sooner. He asked for recommendations of a different Dermatologist.    Jenny Reichmann,   Is there anyway to get Mr Azerba[place] in with [facility] sooner rather than in March.

## 2018-06-03 NOTE — Telephone Encounter (Signed)
Al advised of recommendations. Information given as below.     [facility] [place] [address] Phone [phone] Fax [phone]

## 2018-06-06 ENCOUNTER — Encounter: Payer: Self-pay | Admitting: Emergency Medicine

## 2018-06-06 ENCOUNTER — Emergency Department (INDEPENDENT_AMBULATORY_CARE_PROVIDER_SITE_OTHER)
Admission: EM | Admit: 2018-06-06 | Discharge: 2018-06-06 | Disposition: A | Discharge status: Home / Self Care | Payer: Medicare Other | Source: Home / Self Care | Attending: Family Medicine | Admitting: Family Medicine

## 2018-06-06 ENCOUNTER — Other Ambulatory Visit: Payer: Self-pay

## 2018-06-06 DIAGNOSIS — J069 Acute upper respiratory infection, unspecified: Secondary | ICD-10-CM | POA: Diagnosis not present

## 2018-06-06 DIAGNOSIS — R05 Cough: Secondary | ICD-10-CM

## 2018-06-06 DIAGNOSIS — R053 Chronic cough: Secondary | ICD-10-CM

## 2018-06-06 MED ORDER — AZITHROMYCIN 250 MG PO TABS: 250.0000 mg | ORAL_TABLET | Freq: Every day | ORAL | 0 refills | Status: DC

## 2018-06-06 NOTE — Discharge Instructions (Signed)
°  Please call to schedule a follow up appointment with your family doctor next week if not improving.

## 2018-06-06 NOTE — ED Triage Notes (Signed)
Congestion, dry cough, sneezing, pain and pressure, sinus headache x 1 month. Saw Jade one week ago she told him to come back if he was not getting better.

## 2018-06-06 NOTE — ED Provider Notes (Signed)
[facility]    CSN: [id number] Arrival date & time: 06/06/18  1002     History   Chief Complaint Chief Complaint  Patient presents with  . Nasal Congestion    HPI Bryan Hatfield is a 85 y.o. male.   HPI  Bryan Hatfield is a 85 y.o. male presenting to UC with c/o nasal congestion, dry cough, sneezing with sinus pain and pressure. Pt reports symptoms have gone on for 1 month. He was seen by Iran Planas, PA, about a week ago.  Symptomatic treatment was encouraged but he was advised to f/u if not improving.  Pt called her office this morning but they were booked.  Pt has tried the tessalon prescribed w/o relief. Denies fever, chills, n/v/d.     Past Medical History:  Diagnosis Date  . Acute respiratory failure ([place]) 09/25/11   hypoxic  . Anxiety   . Arthritis   . Bronchitis    "seems like 2x/year"  . CAD (coronary artery disease)    a. PTCA 2001. b. f/u cath 05/2012 heavy calcification, no PCI required. c. f/u cath for recurrent sx 2017: stable mod CAD unchanged from prior, mild progression of small vessel disease, elevated LVEDP.  . Colon polyps   . COPD (chronic obstructive pulmonary disease) ([place])   . Diabetes mellitus without complication ([place])   . GERD (gastroesophageal reflux disease)   . High cholesterol   . Hyperglycemia   . Hypertension   . Hypothyroidism   . Peripheral vascular disease ([place])    patient denies knowledge of, but listed in his chart  . Pleural effusion    Parapneumonic ?r/t PNA s/p thoracentesis 2008  . Pneumonia    hx  . Sleep apnea    cpap     Patient Active Problem List   Diagnosis Date Noted  . CAD (coronary artery disease) 09/02/2017  . Peripheral vascular disease ([place]) 09/02/2017  . Acute kidney injury ([place]) 09/02/2017  . Acute blood loss anemia 09/02/2017  . COPD (chronic obstructive pulmonary disease) ([place]) 09/02/2017  . Hypothyroidism 09/02/2017  . High cholesterol 09/02/2017  . Diabetes mellitus type 2 in obese ([place])  09/02/2017  . Class 2 obesity with body mass index (BMI) of 36.0 to 36.9 in adult 09/02/2017  . Chronic diastolic heart failure ([place]) 09/02/2017  . OSA on CPAP 09/02/2017  . Thrombocytopenia ([place]) 09/02/2017  . Mass of hepatic flexure of colon 08/31/2017  . 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 Acute medial meniscus tear of left knee 04/19/2017  . Primary osteoarthritis of left knee 03/01/2017  . GAD (generalized anxiety disorder) 11/21/2014  . Arterial hypotension 09/17/2014  . Hypothyroid 09/07/2014  . Other abnormal glucose 06/08/2013  . Essential hypertension, benign 06/02/2013  . Hyperlipidemia 06/02/2013  . Depression 06/02/2013  . Major depressive disorder, single episode 06/02/2013  . Coronary artery disease involving native coronary artery of native heart with angina pectoris ([place]) 05/09/2012  . Morbid obesity ([place]) 09/25/2011  . COPD GOLD 0/I 09/21/2007  . Sleep apnea 09/20/2007    Past Surgical History:  Procedure Laterality Date  . CARDIAC CATHETERIZATION N/A 11/25/2015   Procedure: Left Heart Cath and Coronary Angiography;  Surgeon: Leonie Man, MD;  Location: [facility];  Service: Cardiovascular;  Laterality: N/A;  . CATARACT EXTRACTION W/ INTRAOCULAR LENS  IMPLANT, BILATERAL  ~ 2006  . CHOLECYSTECTOMY N/A 10/11/2012   Procedure: LAPAROSCOPIC CHOLECYSTECTOMY WITH INTRAOPERATIVE CHOLANGIOGRAM;  Surgeon: Adin Hector, MD;  Location: [facility];  Service: General;  Laterality: N/A;  . COLONOSCOPY    . CORONARY ANGIOPLASTY  01/2000  . HEMORRHOID SURGERY  1960's  . KNEE ARTHROSCOPY Left 04/19/2017   Procedure: ARTHROSCOPY KNEE, SUBCHONDROPLASTY;  Surgeon: Dorna Leitz, MD;  Location: [facility];  Service: Orthopedics;  Laterality: Left;  . KNEE ARTHROSCOPY WITH MEDIAL MENISECTOMY Left 04/19/2017   Procedure: KNEE ARTHROSCOPY WITH MEDIAL AND LATERAL MENISECTOMY;  Surgeon: Dorna Leitz, MD;  Location: [facility];  Service: Orthopedics;  Laterality: Left;  .  LAPAROSCOPIC RIGHT COLECTOMY N/A 08/31/2017   Procedure: LAPAROSCOPIC ASSISTED ASCENDING COLECTOMY;  Surgeon: Fanny Skates, MD;  Location: [facility];  Service: General;  Laterality: N/A;       Home Medications    Prior to Admission medications   Medication Sig Start Date End Date Taking? Authorizing Provider  albuterol (PROVENTIL HFA;VENTOLIN HFA) 108 (90 Base) MCG/ACT inhaler Inhale 2 puffs every 6 (six) hours as needed into the lungs for wheezing or shortness of breath.    [provider]  AMBULATORY NON FORMULARY MEDICATION Medication Name: 4 prong cane. Dx: gait instability 03/09/17   [name], MD  Ascorbic Acid (VITAMIN C PO) Take 1 tablet by mouth daily.    [provider]  aspirin EC 81 MG tablet Take 81 mg by mouth daily.      [provider]  atorvastatin (LIPITOR) 40 MG tablet TAKE 1 TABLET BY MOUTH AT  BEDTIME 12/20/17   [name], MD  azithromycin (ZITHROMAX) 250 MG tablet Take 1 tablet (250 mg total) by mouth daily. Take first 2 tablets together, then 1 every day until finished. 06/06/18   Noe Gens, PA-C  Blood Glucose Monitoring Suppl (ONE TOUCH ULTRA 2) w/Device KIT Onetouch ultra kit. DX: E11.9 09/21/17   [name], MD  escitalopram (LEXAPRO) 10 MG tablet TAKE 1 TABLET BY MOUTH  DAILY 03/07/18   [name], MD  ferrous gluconate (FERGON) 324 MG tablet Take 1 tablet by mouth 2 (two) times daily before a meal. 09/06/17   [provider]  fluticasone (FLONASE) 50 MCG/ACT nasal spray One spray in each nostril twice a day, use left hand for right nostril, and right hand for left nostril. 05/31/18   Breeback, Jade L, PA-C  furosemide (LASIX) 40 MG tablet TAKE 1 TABLET BY MOUTH  EVERY OTHER DAY USUALLY ON  MONDAY,WEDNESDAY,FRIDAY 01/17/18   [name], MD  glucose blood (ONE TOUCH ULTRA TEST) test strip For testing blood sugars twice daily.E11.9 09/29/17   [name], MD    ipratropium-albuterol (DUONEB) 0.5-2.5 (3) MG/3ML SOLN USE 1 VIAL VIA NEBULIZER EVERY 2 HOURS AS NEEDED FOR WHEEZING, SHORTNESS OF BREATH. 02/12/17   [name], MD  isosorbide mononitrate (IMDUR) 30 MG 24 hr tablet Take 30 mg by mouth daily.    [provider]  levothyroxine (SYNTHROID, LEVOTHROID) 125 MCG tablet TAKE 1 TABLET BY MOUTH  DAILY BEFORE BREAKFAST 04/15/18   [name], MD  metFORMIN (GLUCOPHAGE-XR) 500 MG 24 hr tablet TAKE 1 TABLET(500 MG) BY MOUTH DAILY WITH BREAKFAST 01/19/18   [name], MD  Multiple Vitamins-Minerals (ICAPS AREDS 2) CAPS Take 1 capsule 2 (two) times daily by mouth.    [provider]  nitroGLYCERIN (NITROSTAT) 0.4 MG SL tablet DISSOLVE 1 TABLET UNDER THE TONGUE EVERY 5 MINUTES AS  NEEDED CHEST PAIN. NOT TO  EXCEED 3 TABLETS IN 15  MINUTES 09/24/17   [name], MD  Tiotropium Bromide-Olodaterol (STIOLTO RESPIMAT) 2.5-2.5  MCG/ACT AERS Inhale 2 puffs into the lungs daily. 03/09/17   [name], MD  triamcinolone cream (KENALOG) 0.1 % APPLY EXTERNALLY TO THE AFFECTED AREA DAILY FOR UP TO 2 WEEKS AS NEEDED. AVOID FACE Patient taking differently: APPLY EXTERNALLY TO THE AFFECTED AREA DAILY FOR UP TO 2 WEEKS AS NEEDED FOR ITCHING . AVOID FACE 03/22/17   [name], MD  VESICARE 5 MG tablet Take 5 mg by mouth daily. 06/12/16   [provider]  zolpidem (AMBIEN) 10 MG tablet TAKE 1 TABLET BY MOUTH EVERY DAY AT BEDTIME AS NEEDED FOR SLEEP 02/28/18   [name], MD    Family History Family History  Problem Relation Age of Onset  . Stroke Mother   . Arthritis Mother   . Asthma Mother   . Emphysema Mother   . Stroke Father   . Arthritis Sister   . Stroke Sister   . Heart attack Neg Hx     Social History Social History   Tobacco Use  . Smoking status: Former Smoker    Packs/day: 1.00    Years: 10.00    Pack years: 10.00    Types: Cigarettes    Last attempt to  quit: 06/01/1982    Years since quitting: 36.0  . Smokeless tobacco: Never Used  . Tobacco comment: "quit smoking ~ 25 years ago; smoked ~ 1 pk/wk"  Substance Use Topics  . Alcohol use: No  . Drug use: No     Allergies   Patient has no known allergies.   Review of Systems Review of Systems  Constitutional: Negative for chills and fever.  HENT: Positive for congestion. Negative for ear pain, sore throat, trouble swallowing and voice change.   Respiratory: Positive for cough. Negative for shortness of breath.   Cardiovascular: Negative for chest pain and palpitations.  Gastrointestinal: Negative for abdominal pain, diarrhea, nausea and vomiting.  Musculoskeletal: Negative for arthralgias, back pain and myalgias.  Skin: Negative for rash.     Physical Exam Triage Vital Signs ED Triage Vitals  Enc Vitals Group     BP 06/06/18 1032 121/66     Pulse Rate 06/06/18 1032 77     Resp --      Temp 06/06/18 1032 (!) 97.4 F (36.3 C)     Temp Source 06/06/18 1032 Oral     SpO2 06/06/18 1032 95 %     Weight 06/06/18 1035 275 lb (124.7 kg)     Height 06/06/18 1035 _0  (1.803 m)     Head Circumference --      Peak Flow --      Pain Score 06/06/18 1033 4     Pain Loc --      Pain Edu? --      Excl. in [place]? --    No data found.  Updated Vital Signs BP 121/66 (BP Location: Right Arm)   Pulse 77   Temp (!) 97.4 F (36.3 C) (Oral)   Ht _1  (1.803 m)   Wt 275 lb (124.7 kg)   SpO2 95%   BMI 38.35 kg/m   Visual Acuity Right Eye Distance:   Left Eye Distance:   Bilateral Distance:    Right Eye Near:   Left Eye Near:    Bilateral Near:     Physical Exam Vitals signs and nursing note reviewed.  Constitutional:      Appearance: Normal appearance. He is well-developed.  HENT:     Head: Normocephalic and atraumatic.  Right Ear: Tympanic membrane normal.     Left Ear: Tympanic membrane normal.     Nose: Nose normal.     Right Sinus: No maxillary sinus tenderness or  frontal sinus tenderness.     Left Sinus: No maxillary sinus tenderness or frontal sinus tenderness.     Mouth/Throat:     Lips: Pink.     Mouth: Mucous membranes are moist.     Pharynx: Oropharynx is clear. Uvula midline.  Neck:     Musculoskeletal: Normal range of motion and neck supple.  Cardiovascular:     Rate and Rhythm: Normal rate and regular rhythm.  Pulmonary:     Effort: Pulmonary effort is normal. No respiratory distress.     Breath sounds: Normal breath sounds. No stridor. No wheezing or rhonchi.  Musculoskeletal: Normal range of motion.  Skin:    General: Skin is warm and dry.  Neurological:     Mental Status: He is alert and oriented to person, place, and time.  Psychiatric:        Behavior: Behavior normal.      UC Treatments / Results  Labs (all labs ordered are listed, but only abnormal results are displayed) Labs Reviewed - No data to display  EKG None  Radiology No results found.  Procedures Procedures (including critical care time)  Medications Ordered in UC Medications - No data to display  Initial Impression / Assessment and Plan / UC Course  I have reviewed the triage vital signs and the nursing notes.  Pertinent labs & imaging results that were available during my care of the patient were reviewed by me and considered in my medical decision making (see chart for details).     Will cover for atypical bacteria and start pt on azithromycin Encouraged f/u with PCP next week  Final Clinical Impressions(s) / UC Diagnoses   Final diagnoses:  Upper respiratory tract infection, unspecified type  Persistent cough for 3 weeks or longer     Discharge Instructions      Please call to schedule a follow up appointment with your family doctor next week if not improving.    ED Prescriptions    Medication Sig Dispense Auth. Provider   azithromycin (ZITHROMAX) 250 MG tablet Take 1 tablet (250 mg total) by mouth daily. Take first 2 tablets  together, then 1 every day until finished. 6 tablet Noe Gens, PA-C     Controlled Substance Prescriptions [place] Controlled Substance Registry consulted? Not Applicable   Tyrell Antonio 06/06/18 1149

## 2018-06-07 NOTE — Telephone Encounter (Signed)
There is no way to get patient seen sooner due to only one or two dermatologist at this location at this time. - CF

## 2018-06-22 ENCOUNTER — Encounter: Payer: Self-pay | Admitting: Family Medicine

## 2018-06-22 ENCOUNTER — Ambulatory Visit (INDEPENDENT_AMBULATORY_CARE_PROVIDER_SITE_OTHER): Payer: Medicare Other | Admitting: Family Medicine

## 2018-06-22 VITALS — BP 129/60 | HR 81 | Ht 71.0 in | Wt 277.0 lb

## 2018-06-22 DIAGNOSIS — R21 Rash and other nonspecific skin eruption: Secondary | ICD-10-CM | POA: Diagnosis not present

## 2018-06-22 MED ORDER — PREDNISONE 20 MG PO TABS: ORAL_TABLET | ORAL | 0 refills | Status: AC

## 2018-06-22 MED ORDER — CLOBETASOL PROPIONATE 0.05 % EX OINT: 1.0000 "application " | TOPICAL_OINTMENT | Freq: Two times a day (BID) | CUTANEOUS | 1 refills | Status: DC

## 2018-06-22 NOTE — Patient Instructions (Addendum)
Recommend Cetaphil  cream.  It comes in a white and green jar.  Apply a small amount of the steroid cream just a real thin layer to the actual rash and then apply the Cetaphil cream to the whole body.  Also pick up claritin and take one a day in the morning to help with itching.

## 2018-06-22 NOTE — Progress Notes (Signed)
Acute Office Visit  Subjective:    Patient ID: Bryan Hatfield, male    DOB: Feb 12, 1927, 85 y.o.   MRN: [id number]  Chief Complaint  Patient presents with  . Rash    HPI Patient is in today for rash for 6 weeks.  He denies any known causes he denies any recent medication changes or new medications or even dosage changes.  He denies any major dietary changes.  He has not had a rash like this previously.  He has been seen at urgent care, on December 9, and has also been seen at the [facility] on December 26.  They both recommended referrals to dermatology but he has been unable to get in until March.  He has been given a couple different steroid creams he says it does seem to help but then he really runs out within a few days because he has such a large area of rash.  He says it never goes completely away.  At one point he was even placed on some oral prednisone again that helped but it did not completely resolve the rash and that it eventually just came back.  He says it is extremely itchy.  No new soaps or lotions etc.  No known triggers.  He is been trying not to scratch at it..  Past Medical History:  Diagnosis Date  . Acute respiratory failure ([place]) 09/25/11   hypoxic  . Anxiety   . Arthritis   . Bronchitis    "seems like 2x/year"  . CAD (coronary artery disease)    a. PTCA 2001. b. f/u cath 05/2012 heavy calcification, no PCI required. c. f/u cath for recurrent sx 2017: stable mod CAD unchanged from prior, mild progression of small vessel disease, elevated LVEDP.  . Colon polyps   . COPD (chronic obstructive pulmonary disease) ([place])   . Diabetes mellitus without complication ([place])   . GERD (gastroesophageal reflux disease)   . High cholesterol   . Hyperglycemia   . Hypertension   . Hypothyroidism   . Peripheral vascular disease ([place])    patient denies knowledge of, but listed in his chart  . Pleural effusion    Parapneumonic ?r/t PNA s/p thoracentesis 2008  . Pneumonia    hx  . Sleep  apnea    cpap     Past Surgical History:  Procedure Laterality Date  . CARDIAC CATHETERIZATION N/A 11/25/2015   Procedure: Left Heart Cath and Coronary Angiography;  Surgeon: Leonie Man, MD;  Location: [facility];  Service: Cardiovascular;  Laterality: N/A;  . CATARACT EXTRACTION W/ INTRAOCULAR LENS  IMPLANT, BILATERAL  ~ 2006  . CHOLECYSTECTOMY N/A 10/11/2012   Procedure: LAPAROSCOPIC CHOLECYSTECTOMY WITH INTRAOPERATIVE CHOLANGIOGRAM;  Surgeon: Adin Hector, MD;  Location: [facility];  Service: General;  Laterality: N/A;  . COLONOSCOPY    . CORONARY ANGIOPLASTY  01/2000  . HEMORRHOID SURGERY  1960's  . KNEE ARTHROSCOPY Left 04/19/2017   Procedure: ARTHROSCOPY KNEE, SUBCHONDROPLASTY;  Surgeon: Dorna Leitz, MD;  Location: [facility];  Service: Orthopedics;  Laterality: Left;  . KNEE ARTHROSCOPY WITH MEDIAL MENISECTOMY Left 04/19/2017   Procedure: KNEE ARTHROSCOPY WITH MEDIAL AND LATERAL MENISECTOMY;  Surgeon: Dorna Leitz, MD;  Location: [facility];  Service: Orthopedics;  Laterality: Left;  . LAPAROSCOPIC RIGHT COLECTOMY N/A 08/31/2017   Procedure: LAPAROSCOPIC ASSISTED ASCENDING COLECTOMY;  Surgeon: Fanny Skates, MD;  Location: [place];  Service: General;  Laterality: N/A;    Family History  Problem Relation Age of Onset  .  Stroke Mother   . Arthritis Mother   . Asthma Mother   . Emphysema Mother   . Stroke Father   . Arthritis Sister   . Stroke Sister   . Heart attack Neg Hx     Social History   Socioeconomic History  . Marital status: Married    Spouse name: Not on file  . Number of children: Not on file  . Years of education: Not on file  . Highest education level: Not on file  Occupational History  . 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 Financial resource strain: Not on file  . Food insecurity:    Worry: Not on file    Inability: Not on file  . Transportation needs:    Medical: Not on file    Non-medical: Not on file    Tobacco Use  . Smoking status: Former Smoker    Packs/day: 1.00    Years: 10.00    Pack years: 10.00    Types: Cigarettes    Last attempt to quit: 06/01/1982    Years since quitting: 36.0  . Smokeless tobacco: Never Used  . Tobacco comment: "quit smoking ~ 25 years ago; smoked ~ 1 pk/wk"  Substance and Sexual Activity  . Alcohol use: No  . Drug use: No  . Sexual activity: Never  Lifestyle  . Physical activity:    Days per week: Not on file    Minutes per session: Not on file  . Stress: Not on file  Relationships  . Social connections:    Talks on phone: Not on file    Gets together: Not on file    Attends religious service: Not on file    Active member of club or organization: Not on file    Attends meetings of clubs or organizations: Not on file    Relationship status: Not on file  . Intimate partner violence:    Fear of current or ex partner: Not on file    Emotionally abused: Not on file    Physically abused: Not on file    Forced sexual activity: Not on file  Other Topics Concern  . Not on file  Social History Narrative  . Not on file    Outpatient Medications Prior to Visit  Medication Sig Dispense Refill  . albuterol (PROVENTIL HFA;VENTOLIN HFA) 108 (90 Base) MCG/ACT inhaler Inhale 2 puffs every 6 (six) hours as needed into the lungs for wheezing or shortness of breath.    . AMBULATORY NON FORMULARY MEDICATION Medication Name: 4 prong cane. Dx: gait instability 1 vial 0  . Ascorbic Acid (VITAMIN C PO) Take 1 tablet by mouth daily.    Marland Kitchen aspirin EC 81 MG tablet Take 81 mg by mouth daily.      Marland Kitchen atorvastatin (LIPITOR) 40 MG tablet TAKE 1 TABLET BY MOUTH AT  BEDTIME 90 tablet 2  . Blood Glucose Monitoring Suppl (ONE TOUCH ULTRA 2) w/Device KIT Onetouch ultra kit. DX: E11.9 1 each 0  . escitalopram (LEXAPRO) 10 MG tablet TAKE 1 TABLET BY MOUTH  DAILY 90 tablet 1  . ferrous gluconate (FERGON) 324 MG tablet Take 1 tablet by mouth 2 (two) times daily before a meal.  2  .  fluticasone (FLONASE) 50 MCG/ACT nasal spray One spray in each nostril twice a day, use left hand for right nostril, and right hand for left nostril. 48 g 3  . furosemide (LASIX) 40 MG tablet TAKE  1 TABLET BY MOUTH  EVERY OTHER DAY USUALLY ON  MONDAY,WEDNESDAY,FRIDAY 45 tablet 1  . glucose blood (ONE TOUCH ULTRA TEST) test strip For testing blood sugars twice daily.E11.9 200 each 12  . ipratropium-albuterol (DUONEB) 0.5-2.5 (3) MG/3ML SOLN USE 1 VIAL VIA NEBULIZER EVERY 2 HOURS AS NEEDED FOR WHEEZING, SHORTNESS OF BREATH. 4050 mL 3  . isosorbide mononitrate (IMDUR) 30 MG 24 hr tablet Take 30 mg by mouth daily.    Marland Kitchen levothyroxine (SYNTHROID, LEVOTHROID) 125 MCG tablet TAKE 1 TABLET BY MOUTH  DAILY BEFORE BREAKFAST 90 tablet 1  . metFORMIN (GLUCOPHAGE-XR) 500 MG 24 hr tablet TAKE 1 TABLET(500 MG) BY MOUTH DAILY WITH BREAKFAST 90 tablet 3  . Multiple Vitamins-Minerals (ICAPS AREDS 2) CAPS Take 1 capsule 2 (two) times daily by mouth.    . nitroGLYCERIN (NITROSTAT) 0.4 MG SL tablet DISSOLVE 1 TABLET UNDER THE TONGUE EVERY 5 MINUTES AS  NEEDED CHEST PAIN. NOT TO  EXCEED 3 TABLETS IN 15  MINUTES 25 tablet 4  . Tiotropium Bromide-Olodaterol (STIOLTO RESPIMAT) 2.5-2.5 MCG/ACT AERS Inhale 2 puffs into the lungs daily. 4 g 5  . triamcinolone cream (KENALOG) 0.1 % APPLY EXTERNALLY TO THE AFFECTED AREA DAILY FOR UP TO 2 WEEKS AS NEEDED. AVOID FACE (Patient taking differently: APPLY EXTERNALLY TO THE AFFECTED AREA DAILY FOR UP TO 2 WEEKS AS NEEDED FOR ITCHING . AVOID FACE) 80 g prn  . VESICARE 5 MG tablet Take 5 mg by mouth daily.    Marland Kitchen zolpidem (AMBIEN) 10 MG tablet TAKE 1 TABLET BY MOUTH EVERY DAY AT BEDTIME AS NEEDED FOR SLEEP 30 tablet 5  . azithromycin (ZITHROMAX) 250 MG tablet Take 1 tablet (250 mg total) by mouth daily. Take first 2 tablets together, then 1 every day until finished. 6 tablet 0   No facility-administered medications prior to visit.     No Known Allergies  ROS     Objective:      Physical Exam  Constitutional: He is oriented to person, place, and time. He appears well-developed and well-nourished.  HENT:  Head: Normocephalic and atraumatic.  Eyes: Conjunctivae and EOM are normal.  Cardiovascular: Normal rate.  Pulmonary/Chest: Effort normal.  Neurological: He is alert and oriented to person, place, and time.  Skin: Skin is dry. No pallor.  Has erythematous scaling patches on his antecubital fossa of both arms.  He has areas on his back and forearms.  Even has an area on his face between his upper lip and nose.  He has several areas on his legs and thighs.  Psychiatric: He has a normal mood and affect. His behavior is normal.  Vitals reviewed.   Noted below is of his left upper back and shoulder.       BP 129/60   Pulse 81   Ht _0  (1.803 m)   Wt 277 lb (125.6 kg)   SpO2 98%   BMI 38.63 kg/m  Wt Readings from Last 3 Encounters:  06/22/18 277 lb (125.6 kg)  06/06/18 275 lb (124.7 kg)  05/31/18 277 lb (125.6 kg)    Health Maintenance Due  Topic Date Due  . OPHTHALMOLOGY EXAM  08/01/1938    There are no preventive care reminders to display for this patient.   Lab Results  Component Value Date   TSH 2.66 05/09/2018   Lab Results  Component Value Date   WBC 6.6 11/04/2017   HGB 12.9 (L) 11/04/2017   HCT 39.6 11/04/2017   MCV 91.2 11/04/2017   PLT  204 11/04/2017   Lab Results  Component Value Date   NA 140 05/09/2018   K 4.7 05/09/2018   CO2 28 05/09/2018   GLUCOSE 100 (H) 05/09/2018   BUN 23 05/09/2018   CREATININE 1.01 05/09/2018   BILITOT 0.4 05/09/2018   ALKPHOS 82 08/25/2017   AST 24 05/09/2018   ALT 20 05/09/2018   PROT 6.6 05/09/2018   ALBUMIN 3.4 (L) 08/25/2017   CALCIUM 8.9 05/09/2018   ANIONGAP 8 09/04/2017   GFR 89.45 05/29/2015   Lab Results  Component Value Date   CHOL 158 03/07/2018   Lab Results  Component Value Date   HDL 62 03/07/2018   Lab Results  Component Value Date   LDLCALC 80 03/07/2018    Lab Results  Component Value Date   TRIG 79 03/07/2018   Lab Results  Component Value Date   CHOLHDL 2.5 03/07/2018   Lab Results  Component Value Date   HGBA1C 6.0 (A) 02/28/2018       Assessment & Plan:   Problem List Items Addressed This Visit    None    Visit Diagnoses    Rash    -  Primary   Relevant Orders   Ambulatory referral to Dermatology   Fungal stain     Rash is most consistent with dermatitis some type of eczematous form.  Discussed options.  We will definitely work on trying to get him in with dermatology sooner rather than later we will look to see if we can get him in earlier in [place] he is willing to travel.  For now we will put him on prednisone for the next 2 weeks with a taper since that since this has helped previously.  We will also prescribe topical clobetasol ointment just recommend applying a very thin layer and then applying Eucerin cream on top to moisturize the skin.  Unclear trigger or etiology for this.  Also did a skin scraping of the left upper arm today just to rule out fungal elements.  To try to reduce his scratching recommended a trial of an antihistamine.  To avoid anything too sedating because he is 89.   Meds ordered this encounter  Medications  . predniSONE (DELTASONE) 20 MG tablet    Sig: Take 2 tablets (40 mg total) by mouth daily with breakfast for 5 days, THEN 1 tablet (20 mg total) daily with breakfast for 5 days, THEN 0.5 tablets (10 mg total) daily with breakfast for 5 days.    Dispense:  18 tablet    Refill:  0  . clobetasol ointment (TEMOVATE) 0.05 %    Sig: Apply 1 application topically 2 (two) times daily.    Dispense:  120 g    Refill:  1     Beatrice Lecher, MD

## 2018-06-24 LAB — FUNGAL STAIN
FUNGAL SMEAR:: NONE SEEN
MICRO NUMBER:: 89414
SPECIMEN QUALITY:: ADEQUATE

## 2018-06-30 ENCOUNTER — Ambulatory Visit: Payer: Medicare Other | Admitting: Family Medicine

## 2018-06-30 NOTE — Progress Notes (Deleted)
Cardiology Office Note:    Date:  06/30/2018   ID:  THEOPHIL THIVIERGE, DOB 1927/01/04, MRN [id number]  PCP:  [name], MD  Cardiologist:  Sinclair Grooms, MD   Referring MD: [name], *   No chief complaint on file.   History of Present Illness:    Bryan Hatfield is a 85 y.o. male with a hx of CAD, PTCA 2001, heavy coronary calcification at cath, chronic diastolic heart failure,hypertension,HLD, COPD, OSA on CPAP, parapneumonic pleural effusion r/t PNA s/p thoracentesis 2008, h/o asbestos exposure, remote tobacco abuse (only 5 year history of smoking)   ***   Past Medical History:  Diagnosis Date  . Acute respiratory failure ([place]) 09/25/11   hypoxic  . Anxiety   . Arthritis   . Bronchitis    "seems like 2x/year"  . CAD (coronary artery disease)    a. PTCA 2001. b. f/u cath 05/2012 heavy calcification, no PCI required. c. f/u cath for recurrent sx 2017: stable mod CAD unchanged from prior, mild progression of small vessel disease, elevated LVEDP.  . Colon polyps   . COPD (chronic obstructive pulmonary disease) ([place])   . Diabetes mellitus without complication ([place])   . GERD (gastroesophageal reflux disease)   . High cholesterol   . Hyperglycemia   . Hypertension   . Hypothyroidism   . Peripheral vascular disease ([place])    patient denies knowledge of, but listed in his chart  . Pleural effusion    Parapneumonic ?r/t PNA s/p thoracentesis 2008  . Pneumonia    hx  . Sleep apnea    cpap     Past Surgical History:  Procedure Laterality Date  . CARDIAC CATHETERIZATION N/A 11/25/2015   Procedure: Left Heart Cath and Coronary Angiography;  Surgeon: Leonie Man, MD;  Location: [facility];  Service: Cardiovascular;  Laterality: N/A;  . CATARACT EXTRACTION W/ INTRAOCULAR LENS  IMPLANT, BILATERAL  ~ 2006  . CHOLECYSTECTOMY N/A 10/11/2012   Procedure: LAPAROSCOPIC CHOLECYSTECTOMY WITH INTRAOPERATIVE CHOLANGIOGRAM;  Surgeon: Adin Hector, MD;   Location: [facility];  Service: General;  Laterality: N/A;  . COLONOSCOPY    . CORONARY ANGIOPLASTY  01/2000  . HEMORRHOID SURGERY  1960's  . KNEE ARTHROSCOPY Left 04/19/2017   Procedure: ARTHROSCOPY KNEE, SUBCHONDROPLASTY;  Surgeon: Dorna Leitz, MD;  Location: [facility];  Service: Orthopedics;  Laterality: Left;  . KNEE ARTHROSCOPY WITH MEDIAL MENISECTOMY Left 04/19/2017   Procedure: KNEE ARTHROSCOPY WITH MEDIAL AND LATERAL MENISECTOMY;  Surgeon: Dorna Leitz, MD;  Location: [facility];  Service: Orthopedics;  Laterality: Left;  . LAPAROSCOPIC RIGHT COLECTOMY N/A 08/31/2017   Procedure: LAPAROSCOPIC ASSISTED ASCENDING COLECTOMY;  Surgeon: Fanny Skates, MD;  Location: [facility];  Service: General;  Laterality: N/A;    Current Medications: No outpatient medications have been marked as taking for the 07/01/18 encounter (Appointment) with Belva Crome, MD.     Allergies:   Patient has no known allergies.   Social History   Socioeconomic History  . Marital status: Married    Spouse name: Not on file  . Number of children: Not on file  . Years of education: Not on file  . Highest education level: Not on file  Occupational History  . 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 Financial resource strain: Not on file  . Food insecurity:    Worry: Not on file    Inability: Not on file  .  Transportation needs:    Medical: Not on file    Non-medical: Not on file  Tobacco Use  . Smoking status: Former Smoker    Packs/day: 1.00    Years: 10.00    Pack years: 10.00    Types: Cigarettes    Last attempt to quit: 06/01/1982    Years since quitting: 36.1  . Smokeless tobacco: Never Used  . Tobacco comment: "quit smoking ~ 25 years ago; smoked ~ 1 pk/wk"  Substance and Sexual Activity  . Alcohol use: No  . Drug use: No  . Sexual activity: Never  Lifestyle  . Physical activity:    Days per week: Not on file    Minutes per session: Not on file  . Stress: Not on file    Relationships  . Social connections:    Talks on phone: Not on file    Gets together: Not on file    Attends religious service: Not on file    Active member of club or organization: Not on file    Attends meetings of clubs or organizations: Not on file    Relationship status: Not on file  Other Topics Concern  . Not on file  Social History Narrative  . Not on file     Family History: The patient's family history includes Arthritis in his mother and sister; Asthma in his mother; Emphysema in his mother; Stroke in his father, mother, and sister. There is no history of Heart attack.  ROS:   Please see the history of present illness.    He is having difficulty with sleeping, constipation, gum pain, and depression.  All other systems reviewed and are negative.  EKGs/Labs/Other Studies Reviewed:    The following studies were reviewed today: Most recent CV evaluation: Coronary angiography June 2017: Diagnostic  Dominance: Right   No significant obstructive disease noted.  EKG:  EKG ***  Recent Labs: 11/04/2017: Hemoglobin 12.9; Platelets 204 05/09/2018: ALT 20; BUN 23; Creat 1.01; Potassium 4.7; Sodium 140; TSH 2.66  Recent Lipid Panel    Component Value Date/Time   CHOL 158 03/07/2018 0814   TRIG 79 03/07/2018 0814   HDL 62 03/07/2018 0814   CHOLHDL 2.5 03/07/2018 0814   VLDL 17 09/05/2014 0837   LDLCALC 80 03/07/2018 0814    Physical Exam:    VS:  There were no vitals taken for this visit.    Wt Readings from Last 3 Encounters:  06/22/18 277 lb (125.6 kg)  06/06/18 275 lb (124.7 kg)  05/31/18 277 lb (125.6 kg)     GEN: ***. No acute distress HEENT: Normal NECK: No JVD. LYMPHATICS: No lymphadenopathy CARDIAC: ***RRR.  *** murmur, ***gallop, ***edema VASCULAR: *** Pulses, *** bruits RESPIRATORY:  Clear to auscultation without rales, wheezing or rhonchi  ABDOMEN: Soft, non-tender, non-distended, No pulsatile mass, MUSCULOSKELETAL: No deformity  SKIN: Warm and  dry NEUROLOGIC:  Alert and oriented x 3 PSYCHIATRIC:  Normal affect   ASSESSMENT:    1. Coronary artery disease involving native coronary artery of native heart with angina pectoris ([place])   2. Sleep apnea, unspecified type   3. Mixed hyperlipidemia   4. Essential hypertension, benign   5. Morbid obesity ([place])    PLAN:    In order of problems listed above:  1. ***   Medication Adjustments/Labs and Tests Ordered: Current medicines are reviewed at length with the patient today.  Concerns regarding medicines are outlined above.  No orders of the defined types were placed in this encounter.  No orders of the defined types were placed in this encounter.   There are no Patient Instructions on file for this visit.   Signed, Sinclair Grooms, MD  06/30/2018 6:13 PM    [facility]

## 2018-07-01 ENCOUNTER — Ambulatory Visit: Payer: Medicare Other | Admitting: Interventional Cardiology

## 2018-07-04 ENCOUNTER — Ambulatory Visit (INDEPENDENT_AMBULATORY_CARE_PROVIDER_SITE_OTHER): Payer: Medicare Other | Admitting: Family Medicine

## 2018-07-04 ENCOUNTER — Encounter: Payer: Self-pay | Admitting: Interventional Cardiology

## 2018-07-04 ENCOUNTER — Encounter: Payer: Self-pay | Admitting: Family Medicine

## 2018-07-04 VITALS — BP 132/62 | HR 90 | Ht 71.0 in | Wt 272.0 lb

## 2018-07-04 DIAGNOSIS — E1169 Type 2 diabetes mellitus with other specified complication: Secondary | ICD-10-CM | POA: Diagnosis not present

## 2018-07-04 DIAGNOSIS — R21 Rash and other nonspecific skin eruption: Secondary | ICD-10-CM

## 2018-07-04 DIAGNOSIS — F5101 Primary insomnia: Secondary | ICD-10-CM

## 2018-07-04 DIAGNOSIS — E119 Type 2 diabetes mellitus without complications: Secondary | ICD-10-CM

## 2018-07-04 DIAGNOSIS — I10 Essential (primary) hypertension: Secondary | ICD-10-CM | POA: Diagnosis not present

## 2018-07-04 DIAGNOSIS — E669 Obesity, unspecified: Secondary | ICD-10-CM

## 2018-07-04 DIAGNOSIS — E661 Drug-induced obesity: Secondary | ICD-10-CM | POA: Diagnosis not present

## 2018-07-04 DIAGNOSIS — Z6837 Body mass index (BMI) 37.0-37.9, adult: Secondary | ICD-10-CM

## 2018-07-04 LAB — POCT GLYCOSYLATED HEMOGLOBIN (HGB A1C): Hemoglobin A1C: 6.8 % — AB (ref 4.0–5.6)

## 2018-07-04 NOTE — Progress Notes (Signed)
Subjective:    CC: DM  HPI:  Diabetes - no hypoglycemic events. No wounds or sores that are not healing well. No increased thirst or urination. Checking glucose at home. Taking medications as prescribed without any side effects. He is on the keto diet.    Hypertension- Pt denies chest pain, SOB, dizziness, or heart palpitations.  Taking meds as directed w/o problems.  Denies medication side effects.    F/U rash -he is also here today to follow-up for recent rash.  He had had a rash for almost 6 weeks before I saw him about 10 days ago.  Quite diffuse over most of his body and looked most consistent with eczema.  We discussed options he was unable to get in with dermatology until May.  We put him on oral prednisone for 2 weeks and also gave him a topical clobetasol ointment.  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  He says he also picked up the Eucerin moisturizer.  And the rash is almost completely gone.  He had also mentioned an abnormal looking skin lesion on his left lower leg.  And he has made an appointment the [place] and they will be scheduling to remove it on the 10th of this month.  F/u insomnia -doing well on his Ambien denies any side effects such as excess sedation.  BMI 37-he recently started on the keto diet he says he is really trying to lose some weight.  Back he is down 5 pounds from when he was here couple of weeks ago.  Past medical history, Surgical history, Family history not pertinant except as noted below, Social history, Allergies, and medications have been entered into the medical record, reviewed, and corrections made.   Review of Systems: No fevers, chills, night sweats, weight loss, chest pain, or shortness of breath.   Objective:    General: Well Developed, well nourished, and in no acute distress.  Neuro: Alert and oriented x3, extra-ocular muscles intact, sensation grossly intact.  HEENT: Normocephalic, atraumatic  Skin: Warm and dry, no rashes. Cardiac:  Regular rate and rhythm, no murmurs rubs or gallops, no lower extremity edema.  Respiratory: Clear to auscultation bilaterally. Not using accessory muscles, speaking in full sentences.   Impression and Recommendations:    DM - A1C is 6.8 today.  Still well controlled though up from previous. Continue current regimen. Follow up in  3-4 months.    HTN - Well controlled. Continue current regimen. Follow up in  3-4 months.    Rash - much improved.  In fact mostly resolved.  Insomnia - doing well on Ambien. No changes.    BMI 37 class II obesity with comorbidity.-he recently started on the keto diet he says he is really trying to lose some weight.  Back he is down 5 pounds from when he was here couple of weeks ago.  Courage him to continue to work on it.  I think would make a big difference in his diabetes control his blood pressure and his joint pain.

## 2018-08-04 ENCOUNTER — Encounter: Payer: Self-pay | Admitting: Family Medicine

## 2018-08-04 ENCOUNTER — Ambulatory Visit (INDEPENDENT_AMBULATORY_CARE_PROVIDER_SITE_OTHER): Payer: Medicare Other | Admitting: Family Medicine

## 2018-08-04 VITALS — BP 131/64 | HR 83 | Ht 71.0 in | Wt 281.0 lb

## 2018-08-04 DIAGNOSIS — M7989 Other specified soft tissue disorders: Secondary | ICD-10-CM | POA: Diagnosis not present

## 2018-08-04 DIAGNOSIS — Z8589 Personal history of malignant neoplasm of other organs and systems: Secondary | ICD-10-CM | POA: Diagnosis not present

## 2018-08-04 NOTE — Progress Notes (Signed)
Acute Office Visit  Subjective:    Patient ID: Bryan Hatfield, male    DOB: July 01, 1926, 85 y.o.   MRN: [id number]  Chief Complaint  Patient presents with  . Leg Swelling    pt reports that he had surgery done on his L leg on 07/11/2018 by derm and he f/u on 08/01/2018 and was told to f/u with pcp for the swelling. denies any f/s/c    HPI Patient is in today for Leg swelling bilat.  Has some pitting edema. Worse on the left than the right.  Had a biopsy on his left leg on 2/10 and had a f/u on 3/2 and derm recommend he see PCP for swelling. He has been taking his Lasix Mon, WEd, and Friday.   He is up 9 lbs in 4 weeks.  He denies any chest pain or shortness of breath.  He did get the pathology report back on his biopsy that he had done on his right lower leg it was consistent with squamous cell cancer they did get all of the cancer and he has been doing his appropriate wound care he is been using bacitracin ointment on it.  He was given some compression stockings by the [place] but says they are really difficult to put on.  Let me know that his eye doctor had ordered a carotid Dopplers which she had done.  He does have a letter from the [place] stating that they were normal.  Past Medical History:  Diagnosis Date  . Acute respiratory failure ([place]) 09/25/11   hypoxic  . Anxiety   . Arthritis   . Bronchitis    "seems like 2x/year"  . CAD (coronary artery disease)    a. PTCA 2001. b. f/u cath 05/2012 heavy calcification, no PCI required. c. f/u cath for recurrent sx 2017: stable mod CAD unchanged from prior, mild progression of small vessel disease, elevated LVEDP.  . Colon polyps   . COPD (chronic obstructive pulmonary disease) ([place])   . Diabetes mellitus without complication ([place])   . GERD (gastroesophageal reflux disease)   . High cholesterol   . Hyperglycemia   . Hypertension   . Hypothyroidism   . Peripheral vascular disease ([place])    patient denies knowledge of, but listed in his chart  .  Pleural effusion    Parapneumonic ?r/t PNA s/p thoracentesis 2008  . Pneumonia    hx  . Sleep apnea    cpap     Past Surgical History:  Procedure Laterality Date  . CARDIAC CATHETERIZATION N/A 11/25/2015   Procedure: Left Heart Cath and Coronary Angiography;  Surgeon: Leonie Man, MD;  Location: [facility];  Service: Cardiovascular;  Laterality: N/A;  . CATARACT EXTRACTION W/ INTRAOCULAR LENS  IMPLANT, BILATERAL  ~ 2006  . CHOLECYSTECTOMY N/A 10/11/2012   Procedure: LAPAROSCOPIC CHOLECYSTECTOMY WITH INTRAOPERATIVE CHOLANGIOGRAM;  Surgeon: Adin Hector, MD;  Location: [facility];  Service: General;  Laterality: N/A;  . COLONOSCOPY    . CORONARY ANGIOPLASTY  01/2000  . HEMORRHOID SURGERY  1960's  . KNEE ARTHROSCOPY Left 04/19/2017   Procedure: ARTHROSCOPY KNEE, SUBCHONDROPLASTY;  Surgeon: Dorna Leitz, MD;  Location: [facility];  Service: Orthopedics;  Laterality: Left;  . KNEE ARTHROSCOPY WITH MEDIAL MENISECTOMY Left 04/19/2017   Procedure: KNEE ARTHROSCOPY WITH MEDIAL AND LATERAL MENISECTOMY;  Surgeon: Dorna Leitz, MD;  Location: [facility];  Service: Orthopedics;  Laterality: Left;  . LAPAROSCOPIC RIGHT COLECTOMY N/A 08/31/2017   Procedure: LAPAROSCOPIC ASSISTED ASCENDING COLECTOMY;  Surgeon: Fanny Skates, MD;  Location[facility];  Service: General;  Laterality: N/A;    Family History  Problem Relation Age of Onset  . Stroke Mother   . Arthritis Mother   . Asthma Mother   . Emphysema Mother   . Stroke Father   . Arthritis Sister   . Stroke Sister   . Heart attack Neg Hx     Social History   Socioeconomic History  . Marital status: Married    Spouse name: Not on file  . Number of children: Not on file  . Years of education: Not on file  . Highest education level: Not on file  Occupational History  . 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 Financial resource strain: Not on file  . Food insecurity:    Worry: Not on file     Inability: Not on file  . Transportation needs:    Medical: Not on file    Non-medical: Not on file  Tobacco Use  . Smoking status: Former Smoker    Packs/day: 1.00    Years: 10.00    Pack years: 10.00    Types: Cigarettes    Last attempt to quit: 06/01/1982    Years since quitting: 36.2  . Smokeless tobacco: Never Used  . Tobacco comment: "quit smoking ~ 25 years ago; smoked ~ 1 pk/wk"  Substance and Sexual Activity  . Alcohol use: No  . Drug use: No  . Sexual activity: Never  Lifestyle  . Physical activity:    Days per week: Not on file    Minutes per session: Not on file  . Stress: Not on file  Relationships  . Social connections:    Talks on phone: Not on file    Gets together: Not on file    Attends religious service: Not on file    Active member of club or organization: Not on file    Attends meetings of clubs or organizations: Not on file    Relationship status: Not on file  . Intimate partner violence:    Fear of current or ex partner: Not on file    Emotionally abused: Not on file    Physically abused: Not on file    Forced sexual activity: Not on file  Other Topics Concern  . Not on file  Social History Narrative  . Not on file    Outpatient Medications Prior to Visit  Medication Sig Dispense Refill  . albuterol (PROVENTIL HFA;VENTOLIN HFA) 108 (90 Base) MCG/ACT inhaler Inhale 2 puffs every 6 (six) hours as needed into the lungs for wheezing or shortness of breath.    . AMBULATORY NON FORMULARY MEDICATION Medication Name: 4 prong cane. Dx: gait instability 1 vial 0  . Ascorbic Acid (VITAMIN C PO) Take 1 tablet by mouth daily.    Marland Kitchen aspirin EC 81 MG tablet Take 81 mg by mouth daily.      Marland Kitchen atorvastatin (LIPITOR) 40 MG tablet TAKE 1 TABLET BY MOUTH AT  BEDTIME 90 tablet 2  . Blood Glucose Monitoring Suppl (ONE TOUCH ULTRA 2) w/Device KIT Onetouch ultra kit. DX: E11.9 1 each 0  . clobetasol ointment (TEMOVATE) 6.01 % Apply 1 application topically 2 (two) times  daily. 120 g 1  . escitalopram (LEXAPRO) 10 MG tablet TAKE 1 TABLET BY MOUTH  DAILY 90 tablet 1  . ferrous gluconate (FERGON) 324 MG tablet Take 1 tablet by mouth 2 (two) times daily  before a meal.  2  . fluticasone (FLONASE) 50 MCG/ACT nasal spray One spray in each nostril twice a day, use left hand for right nostril, and right hand for left nostril. 48 g 3  . furosemide (LASIX) 40 MG tablet TAKE 1 TABLET BY MOUTH  EVERY OTHER DAY USUALLY ON  MONDAY,WEDNESDAY,FRIDAY 45 tablet 1  . glucose blood (ONE TOUCH ULTRA TEST) test strip For testing blood sugars twice daily.E11.9 200 each 12  . ipratropium-albuterol (DUONEB) 0.5-2.5 (3) MG/3ML SOLN USE 1 VIAL VIA NEBULIZER EVERY 2 HOURS AS NEEDED FOR WHEEZING, SHORTNESS OF BREATH. 4050 mL 3  . isosorbide mononitrate (IMDUR) 30 MG 24 hr tablet Take 30 mg by mouth daily.    Marland Kitchen levothyroxine (SYNTHROID, LEVOTHROID) 125 MCG tablet TAKE 1 TABLET BY MOUTH  DAILY BEFORE BREAKFAST 90 tablet 1  . metFORMIN (GLUCOPHAGE-XR) 500 MG 24 hr tablet TAKE 1 TABLET(500 MG) BY MOUTH DAILY WITH BREAKFAST 90 tablet 3  . Multiple Vitamins-Minerals (ICAPS AREDS 2) CAPS Take 1 capsule 2 (two) times daily by mouth.    . nitroGLYCERIN (NITROSTAT) 0.4 MG SL tablet DISSOLVE 1 TABLET UNDER THE TONGUE EVERY 5 MINUTES AS  NEEDED CHEST PAIN. NOT TO  EXCEED 3 TABLETS IN 15  MINUTES 25 tablet 4  . Tiotropium Bromide-Olodaterol (STIOLTO RESPIMAT) 2.5-2.5 MCG/ACT AERS Inhale 2 puffs into the lungs daily. 4 g 5  . triamcinolone cream (KENALOG) 0.1 % APPLY EXTERNALLY TO THE AFFECTED AREA DAILY FOR UP TO 2 WEEKS AS NEEDED. AVOID FACE (Patient taking differently: APPLY EXTERNALLY TO THE AFFECTED AREA DAILY FOR UP TO 2 WEEKS AS NEEDED FOR ITCHING . AVOID FACE) 80 g prn  . VESICARE 5 MG tablet Take 5 mg by mouth daily.    Marland Kitchen zolpidem (AMBIEN) 10 MG tablet TAKE 1 TABLET BY MOUTH EVERY DAY AT BEDTIME AS NEEDED FOR SLEEP 30 tablet 5   No facility-administered medications prior to visit.     No Known  Allergies  ROS     Objective:    Physical Exam  Constitutional: He is oriented to person, place, and time. He appears well-developed and well-nourished.  HENT:  Head: Normocephalic and atraumatic.  Cardiovascular: Normal rate, regular rhythm and normal heart sounds.  Pulmonary/Chest: Effort normal and breath sounds normal.  Musculoskeletal:     Comments: He has pitting edema from the mid tibia down bilaterally a little worse on the left compared to the right.  No skin rash or erythema etc.  Neurological: He is alert and oriented to person, place, and time.  Skin: Skin is warm and dry.  Psychiatric: He has a normal mood and affect. His behavior is normal.    BP 131/64   Pulse 83   Ht '5\' 11"'  (1.803 m)   Wt 281 lb (127.5 kg)   SpO2 98%   BMI 39.19 kg/m  Wt Readings from Last 3 Encounters:  08/04/18 281 lb (127.5 kg)  07/04/18 272 lb (123.4 kg)  06/22/18 277 lb (125.6 kg)    Health Maintenance Due  Topic Date Due  . OPHTHALMOLOGY EXAM  08/01/1938    There are no preventive care reminders to display for this patient.   Lab Results  Component Value Date   TSH 2.66 05/09/2018   Lab Results  Component Value Date   WBC 6.6 11/04/2017   HGB 12.9 (L) 11/04/2017   HCT 39.6 11/04/2017   MCV 91.2 11/04/2017   PLT 204 11/04/2017   Lab Results  Component Value Date   NA  140 05/09/2018   K 4.7 05/09/2018   CO2 28 05/09/2018   GLUCOSE 100 (H) 05/09/2018   BUN 23 05/09/2018   CREATININE 1.01 05/09/2018   BILITOT 0.4 05/09/2018   ALKPHOS 82 08/25/2017   AST 24 05/09/2018   ALT 20 05/09/2018   PROT 6.6 05/09/2018   ALBUMIN 3.4 (L) 08/25/2017   CALCIUM 8.9 05/09/2018   ANIONGAP 8 09/04/2017   GFR 89.45 05/29/2015   Lab Results  Component Value Date   CHOL 158 03/07/2018   Lab Results  Component Value Date   HDL 62 03/07/2018   Lab Results  Component Value Date   LDLCALC 80 03/07/2018   Lab Results  Component Value Date   TRIG 79 03/07/2018   Lab  Results  Component Value Date   CHOLHDL 2.5 03/07/2018   Lab Results  Component Value Date   HGBA1C 6.8 (A) 07/04/2018       Assessment & Plan:   Problem List Items Addressed This Visit      Other   History of squamous cell carcinoma    Other Visit Diagnoses    Localized swelling of both lower extremities    -  Primary   Relevant Orders   B Nat Peptide   TSH   CBC   BASIC METABOLIC PANEL WITH GFR   Urinalysis, Routine w reflex microscopic      Lower extremity swelling-it sounds like he is already had a Doppler to rule out a DVT done at the [place] per his report.  We will get some labs just to rule out anemia thyroid disorder and significant change in renal function.  I suspect that he is just volume overloaded to so I think if we increase his Lasix and diurese him I think he will improve he is not currently having any chest symptoms.  He does have a history of diastolic heart failure.  Weight himself daily at home and then drop those weights off on Monday or Tuesday when he comes back by to repeat a BMP.  You get home today weight yourself on your home scale.  Weigh yourself daily first thing in the morning. Take 2 Lasix today and then tomorrow morning take 1 like you would normally do and continue to take 1 a day.  Do not skip any days of the week.  On Monday or Tuesday of next week I want you to go back to the lab and have them check a BMP. Want to make sure that we are not drying your kidneys out if we increased your Lasix.  Keep a log of your daily weights and drop that off at the front desk when you come to do your labs early next week.  History of squamous cell of left leg added to problem list.     No orders of the defined types were placed in this encounter.    Beatrice Lecher, MD

## 2018-08-04 NOTE — Patient Instructions (Signed)
You get home today weight yourself on your home scale.  Weigh yourself daily first thing in the morning. Take 2 Lasix today and then tomorrow morning take 1 like you would normally do and continue to take 1 a day.  Do not skip any days of the week.  On Monday or Tuesday of next week I want you to go back to the lab and have them check a BMP. Want to make sure that we are not drying your kidneys out if we increased your Lasix.  Keep a log of your daily weights and drop that off at the front desk when you come to do your labs early next week.

## 2018-08-05 ENCOUNTER — Other Ambulatory Visit: Payer: Self-pay | Admitting: *Deleted

## 2018-08-05 DIAGNOSIS — M7989 Other specified soft tissue disorders: Secondary | ICD-10-CM

## 2018-08-05 LAB — URINALYSIS, ROUTINE W REFLEX MICROSCOPIC
BILIRUBIN URINE: NEGATIVE
Glucose, UA: NEGATIVE
Hgb urine dipstick: NEGATIVE
Ketones, ur: NEGATIVE
Leukocytes,Ua: NEGATIVE
NITRITE: NEGATIVE
Protein, ur: NEGATIVE
SPECIFIC GRAVITY, URINE: 1.028 (ref 1.001–1.03)
pH: 6.5 (ref 5.0–8.0)

## 2018-08-05 LAB — CBC
HCT: 37.7 % — ABNORMAL LOW (ref 38.5–50.0)
Hemoglobin: 12.7 g/dL — ABNORMAL LOW (ref 13.2–17.1)
MCH: 31.4 pg (ref 27.0–33.0)
MCHC: 33.7 g/dL (ref 32.0–36.0)
MCV: 93.1 fL (ref 80.0–100.0)
MPV: 10.2 fL (ref 7.5–12.5)
Platelets: 212 10*3/uL (ref 140–400)
RBC: 4.05 10*6/uL — ABNORMAL LOW (ref 4.20–5.80)
RDW: 12.9 % (ref 11.0–15.0)
WBC: 6.2 10*3/uL (ref 3.8–10.8)

## 2018-08-05 LAB — BASIC METABOLIC PANEL WITH GFR
BUN: 25 mg/dL (ref 7–25)
CO2: 31 mmol/L (ref 20–32)
CREATININE: 0.97 mg/dL (ref 0.70–1.11)
Calcium: 8.8 mg/dL (ref 8.6–10.3)
Chloride: 104 mmol/L (ref 98–110)
GFR, Est African American: 79 mL/min/{1.73_m2} (ref >=60)
GFR, Est Non African American: 68 mL/min/{1.73_m2} (ref >=60)
Glucose, Bld: 108 mg/dL — ABNORMAL HIGH (ref 65–99)
Potassium: 4.4 mmol/L (ref 3.5–5.3)
Sodium: 141 mmol/L (ref 135–146)

## 2018-08-05 LAB — BRAIN NATRIURETIC PEPTIDE: Brain Natriuretic Peptide: 37 pg/mL (ref <100)

## 2018-08-05 LAB — TSH: TSH: 1.94 mIU/L (ref 0.40–4.50)

## 2018-08-09 DIAGNOSIS — M7989 Other specified soft tissue disorders: Secondary | ICD-10-CM | POA: Diagnosis not present

## 2018-08-09 LAB — BASIC METABOLIC PANEL WITH GFR
BUN: 24 mg/dL (ref 7–25)
CO2: 29 mmol/L (ref 20–32)
Calcium: 8.7 mg/dL (ref 8.6–10.3)
Chloride: 103 mmol/L (ref 98–110)
Creat: 0.92 mg/dL (ref 0.70–1.11)
GFR, Est African American: 85 mL/min/{1.73_m2} (ref >=60)
GFR, Est Non African American: 73 mL/min/{1.73_m2} (ref >=60)
Glucose, Bld: 151 mg/dL — ABNORMAL HIGH (ref 65–99)
Potassium: 4.3 mmol/L (ref 3.5–5.3)
Sodium: 140 mmol/L (ref 135–146)

## 2018-08-10 NOTE — Progress Notes (Signed)
All labs are normal. 

## 2018-08-29 ENCOUNTER — Other Ambulatory Visit: Payer: Self-pay | Admitting: Family Medicine

## 2018-09-14 ENCOUNTER — Telehealth: Payer: Self-pay | Admitting: Interventional Cardiology

## 2018-09-14 NOTE — Telephone Encounter (Signed)
Attempted to contact pt in regards to appt on 4/22 with Dr. [name].  Tried both numbers but no VM set up on either.  Need to change to virtual visit if possible, otherwise phone visit is fine.  Was going to schedule for 11 or 1130, whichever is available. Will try again later.

## 2018-09-16 NOTE — Telephone Encounter (Signed)
Called both numbers no answer no vm , tried to reach patient to change appt type to virtual

## 2018-09-18 ENCOUNTER — Other Ambulatory Visit: Payer: Self-pay | Admitting: Family Medicine

## 2018-09-19 DIAGNOSIS — L853 Xerosis cutis: Secondary | ICD-10-CM | POA: Diagnosis not present

## 2018-09-19 DIAGNOSIS — L300 Nummular dermatitis: Secondary | ICD-10-CM | POA: Diagnosis not present

## 2018-09-19 DIAGNOSIS — C44629 Squamous cell carcinoma of skin of left upper limb, including shoulder: Secondary | ICD-10-CM | POA: Diagnosis not present

## 2018-09-19 DIAGNOSIS — C44729 Squamous cell carcinoma of skin of left lower limb, including hip: Secondary | ICD-10-CM | POA: Diagnosis not present

## 2018-09-19 NOTE — Telephone Encounter (Signed)
Attempted both numbers.  No answer, No VM.  Will try again later.

## 2018-09-20 NOTE — Telephone Encounter (Signed)
Spoke with Bryan Hatfield and he is doing great.  States he prefers to wait until August and see Dr. [name] in person.  Rescheduled Bryan Hatfield for August and advised to call sooner if any issues.

## 2018-09-21 ENCOUNTER — Ambulatory Visit: Payer: Medicare Other | Admitting: Interventional Cardiology

## 2018-10-03 ENCOUNTER — Other Ambulatory Visit: Payer: Self-pay | Admitting: Family Medicine

## 2018-10-17 DIAGNOSIS — L299 Pruritus, unspecified: Secondary | ICD-10-CM | POA: Diagnosis not present

## 2018-10-17 DIAGNOSIS — L300 Nummular dermatitis: Secondary | ICD-10-CM | POA: Diagnosis not present

## 2018-10-17 DIAGNOSIS — E785 Hyperlipidemia, unspecified: Secondary | ICD-10-CM | POA: Diagnosis not present

## 2018-10-21 ENCOUNTER — Other Ambulatory Visit: Payer: Self-pay | Admitting: *Deleted

## 2018-10-21 DIAGNOSIS — E669 Obesity, unspecified: Secondary | ICD-10-CM

## 2018-10-21 DIAGNOSIS — E1169 Type 2 diabetes mellitus with other specified complication: Secondary | ICD-10-CM

## 2018-10-21 MED ORDER — METFORMIN HCL ER 500 MG PO TB24: ORAL_TABLET | ORAL | 3 refills | Status: DC

## 2018-10-31 ENCOUNTER — Ambulatory Visit (INDEPENDENT_AMBULATORY_CARE_PROVIDER_SITE_OTHER): Payer: Medicare Other | Admitting: Physician Assistant

## 2018-10-31 ENCOUNTER — Encounter: Payer: Self-pay | Admitting: Physician Assistant

## 2018-10-31 VITALS — BP 148/65 | HR 86 | Temp 98.4°F | Ht 71.0 in | Wt 278.0 lb

## 2018-10-31 DIAGNOSIS — J441 Chronic obstructive pulmonary disease with (acute) exacerbation: Secondary | ICD-10-CM

## 2018-10-31 DIAGNOSIS — R252 Cramp and spasm: Secondary | ICD-10-CM | POA: Diagnosis not present

## 2018-10-31 MED ORDER — PREDNISONE 50 MG PO TABS: ORAL_TABLET | ORAL | 0 refills | Status: DC

## 2018-10-31 MED ORDER — AZITHROMYCIN 250 MG PO TABS: ORAL_TABLET | ORAL | 0 refills | Status: DC

## 2018-10-31 NOTE — Patient Instructions (Signed)

## 2018-10-31 NOTE — Progress Notes (Signed)
Subjective:    Patient ID: Bryan Hatfield, male    DOB: 1926-12-31, 85 y.o.   MRN: [id number]  HPI  Pt is a 85 yo obese male with COPD, HTN, HLD, CAD, W5IO, chronic diastolic heart failure who presents to the clinic with dry cough and SOB for the last week. He has not tried anything OTC. He denies any fever, chills, body aches, nausea or vomiting. He has noted some recent bilateral calf cramping as well for the last few days. He states he is drinking a lot of water. He does report being told to increase lasix to daily in February but he was previously only on lasix 3 days a week. He wonders if he needs to go back down on this. He is taking his stiloto daily with albuterol twice a day. He has had some loose stools today. Cough is not productive but albuterol does help. Denies any sinus pressure or pain. No ear pain. No sick contacts. He has not been out of the house except to go to grocery store.   .. Active Ambulatory Problems    Diagnosis Date Noted  . Sleep apnea 09/20/2007  . COPD GOLD 0/I 09/21/2007  . Morbid obesity ([place]) 09/25/2011  . Coronary artery disease involving native coronary artery of native heart with angina pectoris ([place]) 05/09/2012  . Essential hypertension, benign 06/02/2013  . Hyperlipidemia 06/02/2013  . Depression 06/02/2013  . Hypothyroid 09/07/2014  . Arterial hypotension 09/17/2014  . GAD (generalized anxiety disorder) 11/21/2014  . Primary insomnia 02/19/2015  . Primary osteoarthritis of left knee 03/01/2017  . Acute medial meniscus tear of left knee 04/19/2017  . Major depressive disorder, single episode 06/02/2013  . Other abnormal glucose 06/08/2013  . 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 Mass of hepatic flexure of colon 08/31/2017  . CAD (coronary artery disease) 09/02/2017  . Peripheral vascular disease ([place]) 09/02/2017  . Acute kidney injury ([place]) 09/02/2017  . Acute blood loss anemia 09/02/2017  . COPD (chronic obstructive pulmonary  disease) ([place]) 09/02/2017  . Hypothyroidism 09/02/2017  . High cholesterol 09/02/2017  . Diabetes mellitus type 2 in obese ([place]) 09/02/2017  . Class 2 obesity with body mass index (BMI) of 36.0 to 36.9 in adult 09/02/2017  . Chronic diastolic heart failure ([place]) 09/02/2017  . OSA on CPAP 09/02/2017  . Thrombocytopenia ([place]) 09/02/2017  . History of squamous cell carcinoma 08/04/2018   Resolved Ambulatory Problems    Diagnosis Date Noted  . INSOMNIA UNSPECIFIED 09/21/2007  . Abnormality of gait 09/21/2007  . Acute and chronic respiratory failure with hypoxia ([place]) 09/25/2011  . Leg swelling 09/25/2011  . Unstable angina ([place]) 05/08/2012  . Abnormal nuclear stress test 05/31/2012  . Dyspnea 05/31/2012  . Gallstones and inflammation of gallbladder without obstruction 09/23/2012  . Prediabetes 06/08/2013  . Episode of dizziness 08/30/2014  . Weakness 09/17/2014  . Erythrasma 09/18/2014  . Thyroid activity decreased 11/21/2014  . Dyspnea 05/29/2015  . Viral gastroenteritis 03/20/2016  . Cough 04/21/2016  . Fracture of medial portion of left tibial plateau 04/19/2017  . Diabetes mellitus without complication ([place]) 27/07/5007  . HTN (hypertension) 09/02/2017   Past Medical History:  Diagnosis Date  . Acute respiratory failure ([place]) 09/25/11  . Anxiety   . Arthritis   . Bronchitis   . Colon polyps   . GERD (gastroesophageal reflux disease)   . Hyperglycemia   . Hypertension   . Pleural effusion   . Pneumonia  Review of Systems    see HPI>  Objective:   Physical Exam Vitals signs reviewed.  Constitutional:      Appearance: Normal appearance. He is obese.  HENT:     Head: Normocephalic.     Right Ear: Tympanic membrane normal.     Left Ear: Tympanic membrane normal.     Nose: Nose normal. No congestion.     Mouth/Throat:     Mouth: Mucous membranes are moist.     Pharynx: Oropharynx is clear.  Eyes:     Conjunctiva/sclera: Conjunctivae normal.   Cardiovascular:     Rate and Rhythm: Normal rate and regular rhythm.     Pulses: Normal pulses.  Pulmonary:     Effort: Pulmonary effort is normal.     Breath sounds: No wheezing or rhonchi.     Comments: Right lower lung crackles heard at base compared to the left.  Lymphadenopathy:     Cervical: No cervical adenopathy.  Skin:    Comments: No edema in bilateral legs, calves, ankles, feet.  No tenderness, swelling, or warmth over bilateral calves.   Neurological:     General: No focal deficit present.     Mental Status: He is alert and oriented to person, place, and time.  Psychiatric:        Mood and Affect: Mood normal.        Behavior: Behavior normal.           Assessment & Plan:  Marland KitchenMarland KitchenJenaro was seen today for cough.  Diagnoses and all orders for this visit:  COPD exacerbation ([place]) -     azithromycin (ZITHROMAX Z-PAK) 250 MG tablet; Take 2 tablets (500 mg) on  Day 1,  followed by 1 tablet (250 mg) once daily on Days 2 through 5. -     predniSONE (DELTASONE) 50 MG tablet; One tab PO daily for 5 days.  Leg cramps -     Basic metabolic panel   Treated for COPd exacerbation with zpak and prednisone. Continue to use albuterol as needed. Follow up if symptoms worsening. Low risk COVID-19.   Due to no edema today. Go back to lasix 3 times a week. Will check BMP today. Stay hydrated. Alka seltzer as needed for acute cramps.

## 2018-11-01 ENCOUNTER — Ambulatory Visit: Payer: Medicare Other | Admitting: Family Medicine

## 2018-11-01 DIAGNOSIS — R531 Weakness: Secondary | ICD-10-CM | POA: Diagnosis not present

## 2018-11-01 DIAGNOSIS — E785 Hyperlipidemia, unspecified: Secondary | ICD-10-CM | POA: Diagnosis not present

## 2018-11-01 DIAGNOSIS — L299 Pruritus, unspecified: Secondary | ICD-10-CM | POA: Diagnosis not present

## 2018-11-01 LAB — BASIC METABOLIC PANEL
BUN: 23 mg/dL (ref 7–25)
CO2: 31 mmol/L (ref 20–32)
Calcium: 8.9 mg/dL (ref 8.6–10.3)
Chloride: 104 mmol/L (ref 98–110)
Creat: 1 mg/dL (ref 0.70–1.11)
Glucose, Bld: 119 mg/dL — ABNORMAL HIGH (ref 65–99)
Potassium: 4.4 mmol/L (ref 3.5–5.3)
Sodium: 142 mmol/L (ref 135–146)

## 2018-11-01 NOTE — Progress Notes (Signed)
Call pt: Potassium looks great. Electrolytes look great. Make sure staying hydrated. Magnesium 500mg  daily can be good for cramps.

## 2018-11-16 DIAGNOSIS — L299 Pruritus, unspecified: Secondary | ICD-10-CM | POA: Diagnosis not present

## 2018-11-16 DIAGNOSIS — R531 Weakness: Secondary | ICD-10-CM | POA: Diagnosis not present

## 2018-11-16 DIAGNOSIS — E785 Hyperlipidemia, unspecified: Secondary | ICD-10-CM | POA: Diagnosis not present

## 2018-11-24 ENCOUNTER — Encounter: Payer: Self-pay | Admitting: Family Medicine

## 2018-11-24 ENCOUNTER — Ambulatory Visit (INDEPENDENT_AMBULATORY_CARE_PROVIDER_SITE_OTHER): Payer: Medicare Other | Admitting: Family Medicine

## 2018-11-24 VITALS — Ht 71.0 in

## 2018-11-24 DIAGNOSIS — M7989 Other specified soft tissue disorders: Secondary | ICD-10-CM | POA: Diagnosis not present

## 2018-11-24 DIAGNOSIS — J449 Chronic obstructive pulmonary disease, unspecified: Secondary | ICD-10-CM

## 2018-11-24 DIAGNOSIS — R252 Cramp and spasm: Secondary | ICD-10-CM

## 2018-11-24 MED ORDER — POTASSIUM CHLORIDE CRYS ER 20 MEQ PO TBCR: 20.0000 meq | EXTENDED_RELEASE_TABLET | Freq: Every day | ORAL | 3 refills | Status: DC | PRN

## 2018-11-24 NOTE — Progress Notes (Addendum)
Virtual Visit via Telephone Note  I connected with Bryan Hatfield on 11/24/18 at 10:30 AM EDT by telephone and verified that I am speaking with the correct person using two identifiers.   I discussed the limitations, risks, security and privacy concerns of performing an evaluation and management service by telephone and the availability of in person appointments. I also discussed with the patient that there may be a patient responsible charge related to this service. The patient expressed understanding and agreed to proceed.  Pt was at home and I was in my office for the virtual visit.      Subjective:    CC: increased swelling of legs and ankles    HPI:  Bilateral feet and ankle swelling x 1 weeks.   Took his fluid pills M, W, F and then took daily and then went to daily for swelling and then went back to 3 days per week about 2 weeks ago.  His weight is up up 2-3 lbs.  Having leg cramps.  No fever or chills. No urinary sxs.  He had a sharp pain below his ribs last night but says it lasted a second or two.  Has no significant chest pain or shortness of breath.  He has been having leg cramps as well.  Fevers or chills or cold symptoms.  Seen for COPD exacerbation about 3 weeks ago and given zpack and prednisone. He is feeling much better. He completed his antibiotic.    Past medical history, Surgical history, Family history not pertinant except as noted below, Social history, Allergies, and medications have been entered into the medical record, reviewed, and corrections made.   Review of Systems: No fevers, chills, night sweats, weight loss, chest pain, or shortness of breath.   Objective:    General: Speaking clearly in complete sentences without any shortness of breath.  Alert and oriented x3.  Normal judgment. No apparent acute distress.    Impression and Recommendations:    Lower extremity swelling -suspect related to his venous stasis.  He has had this previously and in fact we  recently adjusted his Lasix for a short period of time about a couple months ago.  But I have him take his medication daily through the weekend and then start taking his medicine every other day instead of just 3 days/week.  We will try to figure out what the right balance is.  Leg cramps-suspect it may be from low potassium levels though his last level 3 weeks ago looked great.  But I will have him take a low-dose potassium with his Lasix.  Explained that if he does not take his Lasix to not take the potassium that day.  We will plan to recheck a BMP in 2 weeks.  COPD - back to baseline and doing well.       I discussed the assessment and treatment plan with the patient. The patient was provided an opportunity to ask questions and all were answered. The patient agreed with the plan and demonstrated an understanding of the instructions.   The patient was advised to call back or seek an in-person evaluation if the symptoms worsen or if the condition fails to improve as anticipated.  I provided 20 minutes of non-face-to-face time during this encounter.   Beatrice Lecher, MD

## 2018-11-29 ENCOUNTER — Telehealth: Payer: Self-pay | Admitting: Interventional Cardiology

## 2018-11-29 NOTE — Telephone Encounter (Signed)
Pt c/o swelling: STAT is pt has developed SOB within 24 hours  1) How much weight have you gained and in what time span? A lot the patient wife states.  2) If swelling, where is the swelling located? Ankles anfeet and half way up to the knees and are red.  3) Are you currently taking a fluid pill? Yes  4) Are you currently SOB? Yes  5) Do you have a log of your daily weights (if so, list)? No   6) Have you gained 3 pounds in a day or 5 pounds in a week? yes  7) Have you traveled recently? No patient would like to the doctor also.

## 2018-11-29 NOTE — Telephone Encounter (Signed)
Spoke with wife, DPR on file.  Pt with swelling to bilateral lower extremities for about a week now.  Pt did virtual visit with PCP last week and felt likely related to venous stasis. Pt normally takes Furosemide 40mg  MWF but has been taking daily for about a week now with no improvement.  Legs are red and hot to touch.  Pt states "they burn".  SOB is a little worse but not much.  Slight improvement of swelling with elevation.  Pt does not weigh regularly but wife states he has certainly gained weight.  Advised to reach back out to PCP as this is likely cellulitis.  Wife and pt really preferred to be seen in our office.  Scheduled pt to see Robbie Lis, PA-C tomorrow.  Advised wife to still reach out to PCP to see if they possibly want to get him started on Abt for cellulitis.  Wife verbalized understanding.  COVID screening performed.      COVID-19 Pre-Screening Questions:  . In the past 7 to 10 days have you had a cough,  shortness of breath, headache, congestion, fever (100 or greater) body aches, chills, sore throat, or sudden loss of taste or sense of smell? No . Have you been around anyone with known Covid 19? No . Have you been around anyone who is awaiting Covid 19 test results in the past 7 to 10 days? No . Have you been around anyone who has been exposed to Covid 19, or has mentioned symptoms of Covid 19 within the past 7 to 10 days? No  If you have any concerns/questions about symptoms patients report during screening (either on the phone or at threshold). Contact the provider seeing the patient or DOD for further guidance.  If neither are available contact a member of the leadership team.

## 2018-11-30 ENCOUNTER — Other Ambulatory Visit: Payer: Self-pay | Admitting: Physician Assistant

## 2018-11-30 ENCOUNTER — Other Ambulatory Visit: Payer: Self-pay

## 2018-11-30 ENCOUNTER — Encounter: Payer: Self-pay | Admitting: Physician Assistant

## 2018-11-30 ENCOUNTER — Ambulatory Visit (INDEPENDENT_AMBULATORY_CARE_PROVIDER_SITE_OTHER): Payer: Medicare Other | Admitting: Physician Assistant

## 2018-11-30 VITALS — BP 120/70 | HR 72 | Ht 71.0 in | Wt 281.8 lb

## 2018-11-30 DIAGNOSIS — E785 Hyperlipidemia, unspecified: Secondary | ICD-10-CM

## 2018-11-30 DIAGNOSIS — I5031 Acute diastolic (congestive) heart failure: Secondary | ICD-10-CM | POA: Diagnosis not present

## 2018-11-30 DIAGNOSIS — I2510 Atherosclerotic heart disease of native coronary artery without angina pectoris: Secondary | ICD-10-CM

## 2018-11-30 DIAGNOSIS — R600 Localized edema: Secondary | ICD-10-CM | POA: Diagnosis not present

## 2018-11-30 DIAGNOSIS — I10 Essential (primary) hypertension: Secondary | ICD-10-CM | POA: Diagnosis not present

## 2018-11-30 LAB — PRO B NATRIURETIC PEPTIDE: NT-Pro BNP: 119 pg/mL (ref 0–486)

## 2018-11-30 MED ORDER — TORSEMIDE 20 MG PO TABS: 20.0000 mg | ORAL_TABLET | Freq: Two times a day (BID) | ORAL | 3 refills | Status: DC

## 2018-11-30 MED ORDER — POTASSIUM CHLORIDE ER 10 MEQ PO TBCR: 10.0000 meq | EXTENDED_RELEASE_TABLET | Freq: Two times a day (BID) | ORAL | 1 refills | Status: DC

## 2018-11-30 NOTE — Progress Notes (Signed)
Cardiology Office Note    Date:  11/30/2018   ID:  Bryan Hatfield, DOB 1926-09-12, MRN [id number]  PCP:  [name], MD  Cardiologist:  Dr. [name]   Chief Complaint: LE edema  History of Present Illness:   Bryan Hatfield is a 85 y.o. male with history of CAD (s/p PTCA 2001 with moderate disease by cath 2017), HTN, HLD, hyperglycemia with A1C 7.5 in 06/2017 (previously pre-diabetic by A1Cs), COPD, OSA on CPAP, parapneumonic pleural effusion r/t PNA s/p thoracentesis 2008, h/o asbestos exposure, remote tobacco abuse (only 5 year history of smoking), probable chronic diastolic CHF seen by LE swelling and redness.   In 2017 nuclear stress test was normal, no evidence of ischemia, EF 65% - done for dyspnea. CTA 11/11/15 showed no evidence of PE, stable pulm nodule consistent with benign etiology. 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 LVEF was normal but LVEDP was 28BTDV, likely diastolic dysfunction. Lasix was recommended.   Last seen by Melina Copa 07/2017 for pre-op clearance.   Recently had LE edema. Seen by PCP virtually and increase lasix 97m to daily from MWF. No improvement and added to my schedule. R > L swelling. He had RLE erythema with may be warmth. No leg pain with ambulation. No recent  He has gained weight. No dyspnea, CP, orthopnea or PND. No syncope or melena. Compliant with low sodium diet.   Past Medical History:  Diagnosis Date  . Acute respiratory failure ([place] 09/25/11   hypoxic  . Anxiety   . Arthritis   . Bronchitis    "seems like 2x/year"  . CAD (coronary artery disease)    a. PTCA 2001. b. f/u cath 05/2012 heavy calcification, no PCI required. c. f/u cath for recurrent sx 2017: stable mod CAD unchanged from prior, mild progression of small vessel disease,  elevated LVEDP.  . Colon polyps   . COPD (chronic obstructive pulmonary disease) ([place]   . Diabetes mellitus without complication ([place]   . GERD (gastroesophageal reflux disease)   . High cholesterol   . Hyperglycemia   . Hypertension   . Hypothyroidism   . Peripheral vascular disease ([place]    patient denies knowledge of, but listed in his chart  . Pleural effusion    Parapneumonic ?r/t PNA s/p thoracentesis 2008  . Pneumonia    hx  . Sleep apnea    cpap     Past Surgical History:  Procedure Laterality Date  . CARDIAC CATHETERIZATION N/A 11/25/2015   Procedure: Left Heart Cath and Coronary Angiography;  Surgeon: DLeonie Man MD;  [facility];  Service: Cardiovascular;  Laterality: N/A;  . CATARACT EXTRACTION W/ INTRAOCULAR LENS  IMPLANT, BILATERAL  ~ 2006  . CHOLECYSTECTOMY N/A 10/11/2012   Procedure: LAPAROSCOPIC CHOLECYSTECTOMY WITH INTRAOPERATIVE CHOLANGIOGRAM;  Surgeon: HAdin Hector MD;  [place] [facility]  Service: General;  Laterality: N/A;  . COLONOSCOPY    . CORONARY ANGIOPLASTY  01/2000  . HEMORRHOID SURGERY  1960's  . KNEE ARTHROSCOPY Left 04/19/2017   Procedure: ARTHROSCOPY KNEE, SUBCHONDROPLASTY;  Surgeon: GDorna Leitz MD;  [place] [facility]  Service: Orthopedics;  Laterality: Left;  . KNEE ARTHROSCOPY WITH MEDIAL MENISECTOMY Left 04/19/2017   Procedure: KNEE ARTHROSCOPY WITH MEDIAL AND LATERAL MENISECTOMY;  Surgeon: GDorna Leitz MD;  [facility];  Service: Orthopedics;  Laterality: Left;  . LAPAROSCOPIC RIGHT COLECTOMY N/A 08/31/2017   Procedure: LAPAROSCOPIC ASSISTED ASCENDING COLECTOMY;  Surgeon: Fanny Skates, MD;  Location: [facility];  Service: General;  Laterality: N/A;    Current Medications:  Prior to Admission medications   Medication Sig Start Date End Date Taking? Authorizing Provider  albuterol (PROVENTIL HFA;VENTOLIN HFA) 108 (90 Base) MCG/ACT inhaler Inhale 2 puffs every 6 (six) hours as needed into the lungs for wheezing or  shortness of breath.   Yes [provider]  AMBULATORY NON FORMULARY MEDICATION Medication Name: 4 prong cane. Dx: gait instability 03/09/17  Yes [name], MD  Ascorbic Acid (VITAMIN C PO) Take 1 tablet by mouth daily.   Yes [provider]  aspirin EC 81 MG tablet Take 81 mg by mouth daily.     Yes [provider]  atorvastatin (LIPITOR) 40 MG tablet TAKE 1 TABLET BY MOUTH AT  BEDTIME 09/19/18  Yes [name], MD  Blood Glucose Monitoring Suppl (ONE TOUCH ULTRA 2) w/Device KIT Onetouch ultra kit. DX: E11.9 09/21/17  Yes [name], MD  clobetasol ointment (TEMOVATE) 7.40 % Apply 1 application topically 2 (two) times daily. 06/22/18  Yes [name], MD  escitalopram (LEXAPRO) 10 MG tablet TAKE 1 TABLET BY MOUTH  DAILY 09/19/18  Yes [name], MD  ferrous gluconate (FERGON) 324 MG tablet Take 1 tablet by mouth 2 (two) times daily before a meal. 09/06/17  Yes [provider]  fluticasone (FLONASE) 50 MCG/ACT nasal spray One spray in each nostril twice a day, use left hand for right nostril, and right hand for left nostril. 05/31/18  Yes Breeback, Jade L, PA-C  folic acid (FOLVITE) 1 MG tablet Take 1 mg by mouth daily. 10/19/18  Yes [provider]  furosemide (LASIX) 40 MG tablet TAKE 1 TABLET BY MOUTH  EVERY OTHER DAY USUALLY ON  MONDAY,WEDNESDAY,FRIDAY 09/19/18  Yes [name], MD  glucose blood (ONE TOUCH ULTRA TEST) test strip For testing blood sugars twice daily.E11.9 09/29/17  Yes [name], MD  ipratropium-albuterol (DUONEB) 0.5-2.5 (3) MG/3ML SOLN USE 1 VIAL VIA NEBULIZER EVERY 2 HOURS AS NEEDED FOR WHEEZING, SHORTNESS OF BREATH. 02/12/17  Yes [name], MD  isosorbide mononitrate (IMDUR) 30 MG 24 hr tablet Take 30 mg by mouth daily.   Yes [provider]  levothyroxine (SYNTHROID, LEVOTHROID) 125 MCG tablet TAKE 1 TABLET BY MOUTH  DAILY BEFORE BREAKFAST 04/15/18   Yes [name], MD  metFORMIN (GLUCOPHAGE-XR) 500 MG 24 hr tablet TAKE 1 TABLET(500 MG) BY MOUTH DAILY WITH BREAKFAST 10/21/18  Yes [name], MD  Methotrexate Sodium (METHOTREXATE, PF,) 250 MG/10ML injection Use as directed 10/19/18  Yes [provider]  Multiple Vitamins-Minerals (ICAPS AREDS 2) CAPS Take 1 capsule 2 (two) times daily by mouth.   Yes [provider]  nitroGLYCERIN (NITROSTAT) 0.4 MG SL tablet DISSOLVE 1 TABLET UNDER THE TONGUE EVERY 5 MINUTES AS  NEEDED CHEST PAIN. NOT TO  EXCEED 3 TABLETS IN 15  MINUTES 09/24/17  Yes [name], MD  potassium chloride SA (K-DUR) 20 MEQ tablet Take 1 tablet (20 mEq total) by mouth daily as needed. Take with your furosemide 11/24/18  Yes [name], MD  Tiotropium Bromide-Olodaterol (STIOLTO RESPIMAT) 2.5-2.5 MCG/ACT AERS Inhale 2 puffs into the lungs daily. 03/09/17  Yes [name], MD  triamcinolone cream (KENALOG) 0.1 % APPLY EXTERNALLY TO THE AFFECTED AREA  DAILY FOR UP TO 2 WEEKS AS NEEDED. AVOID FACE Patient taking differently: APPLY EXTERNALLY TO THE AFFECTED AREA DAILY FOR UP TO 2 WEEKS AS NEEDED FOR ITCHING . AVOID FACE 03/22/17  Yes [name], MD  VESICARE 5 MG tablet Take 5 mg by mouth daily. 06/12/16  Yes [provider]  zolpidem (AMBIEN) 10 MG tablet TAKE 1 TABLET BY MOUTH EVERY DAY AT BEDTIME AS NEEDED FOR SLEEP 10/03/18  Yes [name], MD     Allergies:   Patient has no known allergies.   Social History   Socioeconomic History  . Marital status: Married    Spouse name: Not on file  . Number of children: Not on file  . Years of education: Not on file  . Highest education level: Not on file  Occupational History  . 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 Financial resource strain: Not on file  . Food insecurity    Worry: Not on file    Inability: Not on file  . Transportation needs     Medical: Not on file    Non-medical: Not on file  Tobacco Use  . Smoking status: Former Smoker    Packs/day: 1.00    Years: 10.00    Pack years: 10.00    Types: Cigarettes    Quit date: 06/01/1982    Years since quitting: 36.5  . Smokeless tobacco: Never Used  . Tobacco comment: "quit smoking ~ 25 years ago; smoked ~ 1 pk/wk"  Substance and Sexual Activity  . Alcohol use: No  . Drug use: No  . Sexual activity: Never  Lifestyle  . Physical activity    Days per week: Not on file    Minutes per session: Not on file  . Stress: Not on file  Relationships  . Social Herbalist on phone: Not on file    Gets together: Not on file    Attends religious service: Not on file    Active member of club or organization: Not on file    Attends meetings of clubs or organizations: Not on file    Relationship status: Not on file  Other Topics Concern  . Not on file  Social History Narrative  . Not on file     Family History:  The patient's family history includes Arthritis in his mother and sister; Asthma in his mother; Emphysema in his mother; Stroke in his father, mother, and sister.   ROS:   Please see the history of present illness.    ROS All other systems reviewed and are negative.   PHYSICAL EXAM:   VS:  BP 120/70   Pulse 72   Ht '5\' 11"'  (1.803 m)   Wt 281 lb 12.8 oz (127.8 kg)   SpO2 95%   BMI 39.30 kg/m    GEN: Obese male in no acute distress  HEENT: normal  Neck:  carotid bruits, or masses. JVD difficult to assess due to grith Cardiac: RRR; no murmurs, rubs, or gallops,no edema  Respiratory:  Faint rales  normal work of breathing GI: soft, nontender, + distended, + BS Skin/ MS: BL LE edema 3+ R > L. RLE has erythema  Neuro:  Alert and Oriented x 3, Strength and sensation are intact Psych: euthymic mood, full affect  Wt Readings from Last 3 Encounters:  11/30/18 281 lb 12.8 oz (127.8 kg)  10/31/18 278 lb (126.1 kg)  08/04/18 281 lb (  127.5 kg)       Studies/Labs Reviewed:   EKG:  EKG is ordered today.  The ekg ordered today demonstrates sinus rhythm at a ventricular rate of 72 bpm, PAC  Recent Labs: 05/09/2018: ALT 20 08/04/2018: Brain Natriuretic Peptide 37; Hemoglobin 12.7; Platelets 212; TSH 1.94 10/31/2018: BUN 23; Creat 1.00; Potassium 4.4; Sodium 142   Lipid Panel    Component Value Date/Time   CHOL 158 03/07/2018 0814   TRIG 79 03/07/2018 0814   HDL 62 03/07/2018 0814   CHOLHDL 2.5 03/07/2018 0814   VLDL 17 09/05/2014 0837   LDLCALC 80 03/07/2018 0814    Additional studies/ records that were reviewed today include:   Cardiac Catheterization: 10/2015 Left Heart Cath and Coronary Angiography  Conclusion   1st RPLB lesion, 80% stenosed. Small caliber branch vessel. No change from prior catheterization  Ost 3rd Diag lesion, 60% stenosed. Moderate caliber vessel. No change from prior catheterization  Dist LAD-2 lesion, 55% stenosed. Distal/apical LAD diffusely diseased. No change from last catheterization  Dist LAD-1 lesion, 40% stenosed in what appears to be a myocardial bridging segment. No change from prior catheterization  Ost Cx lesion, 40% stenosed. Also no change from prior catheterization  The left ventricular systolic function is normal.  Mildly elevated LVEDP of 20 mmHg. Likely diastolic dysfunction.   Graphically, relatively similar coronary artery disease compared to prior catheterization. Mild progression of small vessel branch disease. Normal EF, but mildly elevated LVEDP. Considers diastolic dysfunction. I don't think this would given the extent of dyspnea with exertion but he is having. Sign may consider low-dose diuretic, but I think major issues likely noncardiac.  Plan: Standard post radial cath care with TR band removal and discharge post bedrest. Continue follow-up with Dr. [name]. Consider low-dose diuretic. Also continue pulmonary medicine consultation.     ASSESSMENT & PLAN:    1. LE  edema/presumed acute diastolic heart failure Presented with progressive worsening lower extremity edema right greater than left, abdominal distention and early saiety.  Failed Lasix 40 mg daily.  Significant volume overload by exam.  Patient was also seen by DOD Dr. Rayann Heman.  His legs did not felt to have cellulitis.  Started torsemide with supplemental potassium.  Encouraged compression stocking. Consider echo at follow up if no improvement.   2. CAD -No angina.  Continue aspirin, Imdur and statin.  3. HTN -Blood pressure stable on current medication  4. HLD -Continue statin.    Medication Adjustments/Labs and Tests Ordered: Current medicines are reviewed at length with the patient today.  Concerns regarding medicines are outlined above.  Medication changes, Labs and Tests ordered today are listed in the Patient Instructions below. Patient Instructions  Medication Instructions:  Your physician has recommended you make the following change in your medication:  1.  STOP the Lasix 2.  START Torsemide 20 mg taking 1 tablet twice a day 3.  STOP the Potassium 20 meq 4.  START Potassium 10 meq taking 1 tablet twice a day  If you need a refill on your cardiac medications before your next appointment, please call your pharmacy.   Lab work: TODAY:  PRO BNP  If you have labs (blood work) drawn today and your tests are completely normal, you will receive your results only by: Marland Kitchen MyChart Message (if you have MyChart) OR . A paper copy in the mail If you have any lab test that is abnormal or we need to change your treatment, we will call you to review the results.  Testing/Procedures: None ordered  Follow-Up: At [facility], you and your health needs are our priority.  As part of our continuing mission to provide you with exceptional heart care, we have created designated Provider Care Teams.  These Care Teams include your primary Cardiologist (physician) and Advanced Practice Providers  (APPs -  Physician Assistants and Nurse Practitioners) who all work together to provide you with the care you need, when you need it. Marland Kitchen You are scheduled for a in-office follow-up appointment with Robbie Lis, PA-C, 12/12/2018 please arrive at 10:15  Any Other Special Instructions Will Be Listed Below (If Applicable).       Jarrett Soho, [place]  11/30/2018 11:41 AM    [facility] [address], [place], [place]  [address] Phone: [phone]; Fax: [phone]

## 2018-11-30 NOTE — Patient Instructions (Signed)
Medication Instructions:  Your physician has recommended you make the following change in your medication:  1.  STOP the Lasix 2.  START Torsemide 20 mg taking 1 tablet twice a day 3.  STOP the Potassium 20 meq 4.  START Potassium 10 meq taking 1 tablet twice a day  If you need a refill on your cardiac medications before your next appointment, please call your pharmacy.   Lab work: TODAY:  PRO BNP  If you have labs (blood work) drawn today and your tests are completely normal, you will receive your results only by: Marland Kitchen MyChart Message (if you have MyChart) OR . A paper copy in the mail If you have any lab test that is abnormal or we need to change your treatment, we will call you to review the results.  Testing/Procedures: None ordered  Follow-Up: At [facility], you and your health needs are our priority.  As part of our continuing mission to provide you with exceptional heart care, we have created designated Provider Care Teams.  These Care Teams include your primary Cardiologist (physician) and Advanced Practice Providers (APPs -  Physician Assistants and Nurse Practitioners) who all work together to provide you with the care you need, when you need it. Marland Kitchen You are scheduled for a in-office follow-up appointment with Bryan Lis, PA-C, 12/12/2018 please arrive at 10:15  Any Other Special Instructions Will Be Listed Below (If Applicable).

## 2018-12-01 ENCOUNTER — Telehealth: Payer: Self-pay | Admitting: *Deleted

## 2018-12-01 NOTE — Telephone Encounter (Signed)
Left pt a message to return my call to discuss lab results.

## 2018-12-01 NOTE — Telephone Encounter (Signed)
-----   Message from [place], [place] sent at 12/01/2018  8:19 AM EDT ----- Normal fluid market level. Continue torsemide. If redness worsens, follow up with PCP.

## 2018-12-01 NOTE — Telephone Encounter (Signed)
Patient returned call

## 2018-12-05 NOTE — Telephone Encounter (Signed)
Returned pts call and he has been made aware of his lab results. See result note.

## 2018-12-07 ENCOUNTER — Other Ambulatory Visit: Payer: Self-pay | Admitting: *Deleted

## 2018-12-07 MED ORDER — LEVOTHYROXINE SODIUM 125 MCG PO TABS: ORAL_TABLET | ORAL | 3 refills | Status: AC

## 2018-12-09 ENCOUNTER — Encounter: Payer: Self-pay | Admitting: Physician Assistant

## 2018-12-09 ENCOUNTER — Telehealth: Payer: Self-pay | Admitting: Physician Assistant

## 2018-12-09 NOTE — Telephone Encounter (Signed)
New Message ° ° ° °Left message to confirm appt and answer covid questions  °

## 2018-12-12 ENCOUNTER — Ambulatory Visit (INDEPENDENT_AMBULATORY_CARE_PROVIDER_SITE_OTHER): Payer: Medicare Other | Admitting: Physician Assistant

## 2018-12-12 ENCOUNTER — Encounter: Payer: Self-pay | Admitting: Physician Assistant

## 2018-12-12 ENCOUNTER — Other Ambulatory Visit: Payer: Self-pay

## 2018-12-12 VITALS — BP 122/78 | HR 81 | Ht 71.0 in | Wt 281.8 lb

## 2018-12-12 DIAGNOSIS — I2510 Atherosclerotic heart disease of native coronary artery without angina pectoris: Secondary | ICD-10-CM

## 2018-12-12 DIAGNOSIS — I5031 Acute diastolic (congestive) heart failure: Secondary | ICD-10-CM

## 2018-12-12 DIAGNOSIS — I5032 Chronic diastolic (congestive) heart failure: Secondary | ICD-10-CM | POA: Diagnosis not present

## 2018-12-12 DIAGNOSIS — I10 Essential (primary) hypertension: Secondary | ICD-10-CM

## 2018-12-12 LAB — BASIC METABOLIC PANEL
BUN/Creatinine Ratio: 26 — ABNORMAL HIGH (ref 10–24)
BUN: 26 mg/dL (ref 10–36)
CO2: 30 mmol/L — ABNORMAL HIGH (ref 20–29)
Calcium: 8.7 mg/dL (ref 8.6–10.2)
Chloride: 101 mmol/L (ref 96–106)
Creatinine, Ser: 1.01 mg/dL (ref 0.76–1.27)
GFR calc Af Amer: 75 mL/min/{1.73_m2} (ref >59)
GFR calc non Af Amer: 65 mL/min/{1.73_m2} (ref >59)
Glucose: 120 mg/dL — ABNORMAL HIGH (ref 65–99)
Potassium: 4 mmol/L (ref 3.5–5.2)
Sodium: 144 mmol/L (ref 134–144)

## 2018-12-12 NOTE — Patient Instructions (Signed)
Medication Instructions:  Your physician recommends that you continue on your current medications as directed. Please refer to the Current Medication list given to you today.  If you need a refill on your cardiac medications before your next appointment, please call your pharmacy.   Lab work: TODAY:  BMET  If you have labs (blood work) drawn today and your tests are completely normal, you will receive your results only by: Marland Kitchen MyChart Message (if you have MyChart) OR . A paper copy in the mail If you have any lab test that is abnormal or we need to change your treatment, we will call you to review the results.  Testing/Procedures:  none ordered  Follow-Up: At [facility], you and your health needs are our priority.  As part of our continuing mission to provide you with exceptional heart care, we have created designated Provider Care Teams.  These Care Teams include your primary Cardiologist (physician) and Advanced Practice Providers (APPs -  Physician Assistants and Nurse Practitioners) who all work together to provide you with the care you need, when you need it. Marland Kitchen KEEP YOUR APPOINTMENT WITH DR. [name] 01/17/2019  Any Other Special Instructions Will Be Listed Below (If Applicable).

## 2018-12-12 NOTE — Progress Notes (Signed)
Cardiology Office Note    Date:  12/12/2018   ID:  Bryan Hatfield, DOB 1927-02-24, MRN [id number]  PCP:  [name], MD  Cardiologist:  Dr. [name]   Chief Complaint: LE edema follow up  History of Present Illness:   Bryan Hatfield is a 85 y.o. male with history ofCAD (s/p PTCA 2001 with moderate disease by cath 2017), HTN, HLD, hyperglycemia with A1C 7.5 in 06/2017 (previously pre-diabetic by A1Cs), COPD, OSA on CPAP, parapneumonic pleural effusion r/t PNA s/p thoracentesis 2008, h/o asbestos exposure, remote tobacco abuse (only 5 year history of smoking), probable chronic diastolic CHFseen for follow up.   In 2017 nuclear stress test was normal, no evidence of ischemia, EF 65% - done for dyspnea. CTA 11/11/15 showed no evidence of PE, stable pulm nodule consistent with benign etiology. 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 LVEF was normal but LVEDP was [id number], likely diastolic dysfunction. Lasix was recommended.   Last seen by me 11/30/2018 for LE edema and redness. No indication for cellulitis. Exam consistent with volume overload. Stopped Lasix and started on Torsemide.   Here today for follow. His weight was 281lb on last visit >>>  278 today.  Edema improving.  Redness gone.  Unable to wear compression stocking.  Compliant with low-sodium diet.  Denies dyspnea, chest pain, palpitation, fever, chills, cough, congestion, sore throat, melena or syncope.   Past Medical History:  Diagnosis Date  . Acute respiratory failure ([place]) 09/25/11   hypoxic  . Anxiety   . Arthritis   . Bronchitis    "seems like 2x/year"  . CAD (coronary artery disease)    a. PTCA 2001. b. f/u cath 05/2012 heavy calcification, no PCI required. c. f/u cath for recurrent sx 2017: stable mod CAD unchanged from  prior, mild progression of small vessel disease, elevated LVEDP.  . Colon polyps   . COPD (chronic obstructive pulmonary disease) ([place])   . Diabetes mellitus without complication ([place])   . GERD (gastroesophageal reflux disease)   . High cholesterol   . Hyperglycemia   . Hypertension   . Hypothyroidism   . Peripheral vascular disease ([place])    patient denies knowledge of, but listed in his chart  . Pleural effusion    Parapneumonic ?r/t PNA s/p thoracentesis 2008  . Pneumonia    hx  . Sleep apnea    cpap     Past Surgical History:  Procedure Laterality Date  . CARDIAC CATHETERIZATION N/A 11/25/2015   Procedure: Left Heart Cath and Coronary Angiography;  Surgeon: Leonie Man, MD;  Location: [facility];  Service: Cardiovascular;  Laterality: N/A;  . CATARACT EXTRACTION W/ INTRAOCULAR LENS  IMPLANT, BILATERAL  ~ 2006  . CHOLECYSTECTOMY N/A 10/11/2012   Procedure: LAPAROSCOPIC CHOLECYSTECTOMY WITH INTRAOPERATIVE CHOLANGIOGRAM;  Surgeon: Adin Hector, MD;  Location: [facility];  Service: General;  Laterality: N/A;  . COLONOSCOPY    . CORONARY ANGIOPLASTY  01/2000  . HEMORRHOID SURGERY  1960's  . KNEE ARTHROSCOPY Left 04/19/2017   Procedure: ARTHROSCOPY KNEE, SUBCHONDROPLASTY;  Surgeon: Dorna Leitz, MD;  Location: [facility];  Service: Orthopedics;  Laterality: Left;  . KNEE ARTHROSCOPY WITH MEDIAL MENISECTOMY Left 04/19/2017   Procedure: KNEE ARTHROSCOPY WITH MEDIAL AND LATERAL MENISECTOMY;  Surgeon: Dorna Leitz, MD;  Location: [facility];  Service: Orthopedics;  Laterality: Left;  . LAPAROSCOPIC RIGHT COLECTOMY N/A 08/31/2017   Procedure: LAPAROSCOPIC ASSISTED ASCENDING COLECTOMY;  Surgeon: Fanny Skates, MD;  Location: [facility];  Service: General;  Laterality: N/A;    Current Medications: Prior to Admission medications   Medication Sig Start Date End Date Taking? Authorizing Provider  albuterol (PROVENTIL HFA;VENTOLIN HFA) 108 (90 Base) MCG/ACT inhaler Inhale 2 puffs every 6 (six)  hours as needed into the lungs for wheezing or shortness of breath.    [provider]  AMBULATORY NON FORMULARY MEDICATION Medication Name: 4 prong cane. Dx: gait instability 03/09/17   [name], MD  Ascorbic Acid (VITAMIN C PO) Take 1 tablet by mouth daily.    [provider]  aspirin EC 81 MG tablet Take 81 mg by mouth daily.      [provider]  atorvastatin (LIPITOR) 40 MG tablet TAKE 1 TABLET BY MOUTH AT  BEDTIME 09/19/18   [name], MD  Blood Glucose Monitoring Suppl (ONE TOUCH ULTRA 2) w/Device KIT Onetouch ultra kit. DX: E11.9 09/21/17   [name], MD  clobetasol ointment (TEMOVATE) 6.72 % Apply 1 application topically 2 (two) times daily. 06/22/18   [name], MD  escitalopram (LEXAPRO) 10 MG tablet TAKE 1 TABLET BY MOUTH  DAILY 09/19/18   [name], MD  ferrous gluconate (FERGON) 324 MG tablet Take 1 tablet by mouth 2 (two) times daily before a meal. 09/06/17   [provider]  fluticasone (FLONASE) 50 MCG/ACT nasal spray One spray in each nostril twice a day, use left hand for right nostril, and right hand for left nostril. 05/31/18   Donella Stade, PA-C  folic acid (FOLVITE) 1 MG tablet Take 1 mg by mouth daily. 10/19/18   [provider]  glucose blood (ONE TOUCH ULTRA TEST) test strip For testing blood sugars twice daily.E11.9 09/29/17   [name], MD  ipratropium-albuterol (DUONEB) 0.5-2.5 (3) MG/3ML SOLN USE 1 VIAL VIA NEBULIZER EVERY 2 HOURS AS NEEDED FOR WHEEZING, SHORTNESS OF BREATH. 02/12/17   [name], MD  isosorbide mononitrate (IMDUR) 30 MG 24 hr tablet Take 30 mg by mouth daily.    [provider]  levothyroxine (SYNTHROID) 125 MCG tablet TAKE 1 TABLET BY MOUTH  DAILY BEFORE BREAKFAST 12/07/18   [name], MD  metFORMIN (GLUCOPHAGE-XR) 500 MG 24 hr tablet TAKE 1 TABLET(500 MG) BY MOUTH DAILY WITH BREAKFAST 10/21/18   [name], MD  Methotrexate Sodium (METHOTREXATE, PF,) 250 MG/10ML injection Use as directed 10/19/18   [provider]  Multiple Vitamins-Minerals (ICAPS AREDS 2) CAPS Take 1 capsule 2 (two) times daily by mouth.    [provider]  nitroGLYCERIN (NITROSTAT) 0.4 MG SL tablet DISSOLVE 1 TABLET UNDER THE TONGUE EVERY 5 MINUTES AS  NEEDED CHEST PAIN. NOT TO  EXCEED 3 TABLETS IN 15  MINUTES 09/24/17   [name], MD  potassium chloride (K-DUR) 10 MEQ tablet Take 1 tablet (10 mEq total) by mouth 2 (two) times daily. 11/30/18   Reana Chacko, Crista Luria, PA  potassium chloride SA (K-DUR) 20 MEQ tablet Take 1 tablet (20 mEq total) by mouth daily as needed. Take with your furosemide 11/24/18   [name], MD  Tiotropium Bromide-Olodaterol (STIOLTO RESPIMAT) 2.5-2.5 MCG/ACT AERS Inhale 2 puffs into the lungs daily. 03/09/17   [name], MD  torsemide (DEMADEX) 20 MG tablet Take 1 tablet (20 mg total) by mouth 2 (two) times  daily. 11/30/18 02/28/19  Avyon Herendeen, Crista Luria, PA  triamcinolone cream (KENALOG) 0.1 % Apply 1 application topically as needed. Apply externally to affected area daily as needed for itching    [provider]  VESICARE 5 MG tablet Take 5 mg by mouth daily. 06/12/16   [provider]  zolpidem (AMBIEN) 10 MG tablet TAKE 1 TABLET BY MOUTH EVERY DAY AT BEDTIME AS NEEDED FOR SLEEP 10/03/18   [name], MD    Allergies:   Patient has no known allergies.   Social History   Socioeconomic History  . Marital status: Married    Spouse name: Not on file  . Number of children: Not on file  . Years of education: Not on file  . Highest education level: Not on file  Occupational History  . 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 Financial resource strain: Not on file  . Food insecurity    Worry: Not on file    Inability: Not on file  . Transportation needs    Medical: Not on file    Non-medical:  Not on file  Tobacco Use  . Smoking status: Former Smoker    Packs/day: 1.00    Years: 10.00    Pack years: 10.00    Types: Cigarettes    Quit date: 06/01/1982    Years since quitting: 36.5  . Smokeless tobacco: Never Used  . Tobacco comment: "quit smoking ~ 25 years ago; smoked ~ 1 pk/wk"  Substance and Sexual Activity  . Alcohol use: No  . Drug use: No  . Sexual activity: Never  Lifestyle  . Physical activity    Days per week: Not on file    Minutes per session: Not on file  . Stress: Not on file  Relationships  . Social Herbalist on phone: Not on file    Gets together: Not on file    Attends religious service: Not on file    Active member of club or organization: Not on file    Attends meetings of clubs or organizations: Not on file    Relationship status: Not on file  Other Topics Concern  . Not on file  Social History Narrative  . Not on file     Family History:  The patient's family history includes Arthritis in his mother and sister; Asthma in his mother; Emphysema in his mother; Stroke in his father, mother, and sister.  ROS:   Please see the history of present illness.    ROS All other systems reviewed and are negative.   PHYSICAL EXAM:   VS:  BP 122/78   Pulse 81   Ht _0  (1.803 m)   Wt 281 lb 12.8 oz (127.8 kg)   SpO2 93%   BMI 39.30 kg/m    GEN: Well nourished, well developed, in no acute distress  HEENT: normal  Neck: no JVD, carotid bruits, or masses Cardiac: RRR; no murmurs, rubs, or gallops 1+ bilateral lower extremity edema right greater than left  Respiratory:  clear to auscultation bilaterally, normal work of breathing GI: soft, nontender, nondistended, + BS MS: no deformity or atrophy  Skin: warm and dry, no rash Neuro:  Alert and Oriented x 3, Strength and sensation are intact Psych: euthymic mood, full affect  Wt Readings from Last 3 Encounters:  12/12/18 281 lb 12.8 oz (127.8 kg)  11/30/18 281 lb 12.8 oz (127.8 kg)   10/31/18 278 lb (  126.1 kg)      Studies/Labs Reviewed:   EKG:  EKG is not  ordered today.    Recent Labs: 05/09/2018: ALT 20 08/04/2018: Brain Natriuretic Peptide 37; Hemoglobin 12.7; Platelets 212; TSH 1.94 10/31/2018: BUN 23; Creat 1.00; Potassium 4.4; Sodium 142 11/30/2018: NT-Pro BNP 119   Lipid Panel    Component Value Date/Time   CHOL 158 03/07/2018 0814   TRIG 79 03/07/2018 0814   HDL 62 03/07/2018 0814   CHOLHDL 2.5 03/07/2018 0814   VLDL 17 09/05/2014 0837   LDLCALC 80 03/07/2018 0814    Additional studies/ records that were reviewed today include:   Cardiac Catheterization: 10/2015 Left Heart Cath and Coronary Angiography  Conclusion   1st RPLB lesion, 80% stenosed. Small caliber branch vessel. No change from prior catheterization  Ost 3rd Diag lesion, 60% stenosed. Moderate caliber vessel. No change from prior catheterization  Dist LAD-2 lesion, 55% stenosed. Distal/apical LAD diffusely diseased. No change from last catheterization  Dist LAD-1 lesion, 40% stenosed in what appears to be a myocardial bridging segment. No change from prior catheterization  Ost Cx lesion, 40% stenosed. Also no change from prior catheterization  The left ventricular systolic function is normal.  Mildly elevated LVEDP of 20 mmHg. Likely diastolic dysfunction.  Graphically, relatively similar coronary artery disease compared to prior catheterization. Mild progression of small vessel branch disease. Normal EF, but mildly elevated LVEDP. Considers diastolic dysfunction. I don't think this would given the extent of dyspnea with exertion but he is having. Sign may consider low-dose diuretic, but I think major issues likely noncardiac.  Plan: Standard post radial cath care with TR band removal and discharge post bedrest. Continue follow-up with Dr. [name]. Consider low-dose diuretic. Also continue pulmonary medicine consultation.    ASSESSMENT & PLAN:    1.  Bilateral lower  extremity edema/ presumed chronic diastolic CHF He lost 3 pounds since last office visit with improved lower extremity edema and resolution of erythema.  Compliant with low-sodium diet.  Continue torsemide at current dose.  Advised to cut back to daily with potassium once all edema resolves.  Continue to weigh every day.  Bring readings for review during next office visit.  2.  CAD -No angina.  Continue aspirin, statin and Imdur.  3.  Diabetes -Managed by PCP.  4.  Hypertension -Blood pressure stable and well controlled on current medication.   Medication Adjustments/Labs and Tests Ordered: Current medicines are reviewed at length with the patient today.  Concerns regarding medicines are outlined above.  Medication changes, Labs and Tests ordered today are listed in the Patient Instructions below. Patient Instructions  Medication Instructions:  Your physician recommends that you continue on your current medications as directed. Please refer to the Current Medication list given to you today.  If you need a refill on your cardiac medications before your next appointment, please call your pharmacy.   Lab work: TODAY:  BMET  If you have labs (blood work) drawn today and your tests are completely normal, you will receive your results only by: Marland Kitchen MyChart Message (if you have MyChart) OR . A paper copy in the mail If you have any lab test that is abnormal or we need to change your treatment, we will call you to review the results.  Testing/Procedures:  none ordered  Follow-Up: At [facility], you and your health needs are our priority.  As part of our continuing mission to provide you with exceptional heart care, we have created designated Provider Care  Teams.  These Care Teams include your primary Cardiologist (physician) and Advanced Practice Providers (APPs -  Physician Assistants and Nurse Practitioners) who all work together to provide you with the care you need, when you need it. Marland Kitchen  KEEP YOUR APPOINTMENT WITH DR. [name] 01/17/2019  Any Other Special Instructions Will Be Listed Below (If Applicable).       Jarrett Soho, [place]  12/12/2018 11:07 AM    [facility] [address], [place], [place]  [address] Phone: [phone]; Fax: [phone]

## 2018-12-13 NOTE — Progress Notes (Signed)
Pt has been made aware of normal result and verbalized understanding.  jw 12/13/2018

## 2018-12-23 ENCOUNTER — Ambulatory Visit (INDEPENDENT_AMBULATORY_CARE_PROVIDER_SITE_OTHER): Payer: Medicare Other | Admitting: Family Medicine

## 2018-12-23 ENCOUNTER — Other Ambulatory Visit: Payer: Self-pay

## 2018-12-23 ENCOUNTER — Encounter: Payer: Self-pay | Admitting: Family Medicine

## 2018-12-23 VITALS — BP 129/51 | HR 87 | Ht 71.0 in | Wt 284.0 lb

## 2018-12-23 DIAGNOSIS — J029 Acute pharyngitis, unspecified: Secondary | ICD-10-CM

## 2018-12-23 DIAGNOSIS — R0789 Other chest pain: Secondary | ICD-10-CM

## 2018-12-23 DIAGNOSIS — R059 Cough, unspecified: Secondary | ICD-10-CM

## 2018-12-23 DIAGNOSIS — R0602 Shortness of breath: Secondary | ICD-10-CM | POA: Diagnosis not present

## 2018-12-23 DIAGNOSIS — R05 Cough: Secondary | ICD-10-CM | POA: Diagnosis not present

## 2018-12-23 NOTE — Progress Notes (Signed)
Acute Office Visit  Subjective:    Patient ID: Bryan Hatfield, male    DOB: [birth year], 85 y.o.   MRN: [id number]  Chief Complaint  Patient presents with  . Fatigue  . Shortness of Breath    HPI Patient is in today for SOB and fatigue that has increased over the last week.  He has a history of's shortness of breath and deconditioning but just feels like over the last week he is just felt more tired.  He has been sleeping during the day which she says is very much unlike him.  He denies any fevers sweats or chills.  He says he has noticed a little bit more rhonchi and wheeze in his chest.  He has noticed that his weight has gone up a couple pounds on his home scale.  He got down to 278 pounds Wednesday when he saw cardiology about 2 weeks ago but his weight is back up though he denies any increased salt intake.  He also reports that he has had some increase in cough as well as sore throat over the last week.  No known exposure to anyone who is been sick.  He reports that he is had some intermittent chest discomfort as well but it is been mild.  He did want let me know that he does have an appointment at the [facility] on August 10 for his right great toe which has retracted and is now formed a hammertoe and is starting to get a nodule that is erythematous on the surface.  He is seeing podiatry there.  Past Medical History:  Diagnosis Date  . Acute respiratory failure ([place]) 09/25/11   hypoxic  . Anxiety   . Arthritis   . Bronchitis    "seems like 2x/year"  . CAD (coronary artery disease)    a. PTCA 2001. b. f/u cath 05/2012 heavy calcification, no PCI required. c. f/u cath for recurrent sx 2017: stable mod CAD unchanged from prior, mild progression of small vessel disease, elevated LVEDP.  . Colon polyps   . COPD (chronic obstructive pulmonary disease) ([place])   . Diabetes mellitus without complication ([place])   . GERD (gastroesophageal reflux disease)   . High cholesterol   . Hyperglycemia   .  Hypertension   . Hypothyroidism   . Peripheral vascular disease ([place])    patient denies knowledge of, but listed in his chart  . Pleural effusion    Parapneumonic ?r/t PNA s/p thoracentesis 2008  . Pneumonia    hx  . Sleep apnea    cpap     Past Surgical History:  Procedure Laterality Date  . CARDIAC CATHETERIZATION N/A 11/25/2015   Procedure: Left Heart Cath and Coronary Angiography;  Surgeon: Leonie Man, MD;  Location: [place] [facility];  Service: Cardiovascular;  Laterality: N/A;  . CATARACT EXTRACTION W/ INTRAOCULAR LENS  IMPLANT, BILATERAL  ~ 2006  . CHOLECYSTECTOMY N/A 10/11/2012   Procedure: LAPAROSCOPIC CHOLECYSTECTOMY WITH INTRAOPERATIVE CHOLANGIOGRAM;  Surgeon: Adin Hector, MD;  Location: [place];  Service: General;  Laterality: N/A;  . COLONOSCOPY    . CORONARY ANGIOPLASTY  01/2000  . HEMORRHOID SURGERY  1960's  . KNEE ARTHROSCOPY Left 04/19/2017   Procedure: ARTHROSCOPY KNEE, SUBCHONDROPLASTY;  Surgeon: Dorna Leitz, MD;  Location: [place];  Service: Orthopedics;  Laterality: Left;  . KNEE ARTHROSCOPY WITH MEDIAL MENISECTOMY Left 04/19/2017   Procedure: KNEE ARTHROSCOPY WITH MEDIAL AND LATERAL MENISECTOMY;  Surgeon: Dorna Leitz, MD;  Location: [place];  Service: Orthopedics;  Laterality: Left;  . LAPAROSCOPIC RIGHT COLECTOMY N/A 08/31/2017   Procedure: LAPAROSCOPIC ASSISTED ASCENDING COLECTOMY;  Surgeon: Fanny Skates, MD;  Location: [place];  Service: General;  Laterality: N/A;    Family History  Problem Relation Age of Onset  . Stroke Mother   . Arthritis Mother   . Asthma Mother   . Emphysema Mother   . Stroke Father   . Arthritis Sister   . Stroke Sister   . Heart attack Neg Hx     Social History   Socioeconomic History  . Marital status: Married    Spouse name: Not on file  . Number of children: Not on file  . Years of education: Not on file  . Highest education level: Not on file  Occupational History  . Occupation: Retired    Comment: Scientist, water quality and [employer]  Social Needs  . Financial resource strain: Not on file  . Food insecurity    Worry: Not on file    Inability: Not on file  . Transportation needs    Medical: Not on file    Non-medical: Not on file  Tobacco Use  . Smoking status: Former Smoker    Packs/day: 1.00    Years: 10.00    Pack years: 10.00    Types: Cigarettes    Quit date: 06/01/1982    Years since quitting: 36.5  . Smokeless tobacco: Never Used  . Tobacco comment: "quit smoking ~ 25 years ago; smoked ~ 1 pk/wk"  Substance and Sexual Activity  . Alcohol use: No  . Drug use: No  . Sexual activity: Never  Lifestyle  . Physical activity    Days per week: Not on file    Minutes per session: Not on file  . Stress: Not on file  Relationships  . Social Herbalist on phone: Not on file    Gets together: Not on file    Attends religious service: Not on file    Active member of club or organization: Not on file    Attends meetings of clubs or organizations: Not on file    Relationship status: Not on file  . Intimate partner violence    Fear of current or ex partner: Not on file    Emotionally abused: Not on file    Physically abused: Not on file    Forced sexual activity: Not on file  Other Topics Concern  . Not on file  Social History Narrative  . Not on file    Outpatient Medications Prior to Visit  Medication Sig Dispense Refill  . albuterol (PROVENTIL HFA;VENTOLIN HFA) 108 (90 Base) MCG/ACT inhaler Inhale 2 puffs every 6 (six) hours as needed into the lungs for wheezing or shortness of breath.    . AMBULATORY NON FORMULARY MEDICATION Medication Name: 4 prong cane. Dx: gait instability 1 vial 0  . Ascorbic Acid (VITAMIN C PO) Take 1 tablet by mouth daily.    Marland Kitchen aspirin EC 81 MG tablet Take 81 mg by mouth daily.      Marland Kitchen atorvastatin (LIPITOR) 40 MG tablet TAKE 1 TABLET BY MOUTH AT  BEDTIME 90 tablet 2  . Blood Glucose Monitoring Suppl (ONE TOUCH ULTRA 2) w/Device KIT Onetouch ultra kit.  DX: E11.9 1 each 0  . clobetasol ointment (TEMOVATE) 0.37 % Apply 1 application topically 2 (two) times daily. 120 g 1  . escitalopram (LEXAPRO) 10 MG tablet TAKE 1 TABLET BY MOUTH  DAILY 90 tablet  1  . ferrous gluconate (FERGON) 324 MG tablet Take 1 tablet by mouth 2 (two) times daily before a meal.  2  . fluticasone (FLONASE) 50 MCG/ACT nasal spray One spray in each nostril twice a day, use left hand for right nostril, and right hand for left nostril. 48 g 3  . folic acid (FOLVITE) 1 MG tablet Take 1 mg by mouth daily.    Marland Kitchen glucose blood (ONE TOUCH ULTRA TEST) test strip For testing blood sugars twice daily.E11.9 200 each 12  . ipratropium-albuterol (DUONEB) 0.5-2.5 (3) MG/3ML SOLN USE 1 VIAL VIA NEBULIZER EVERY 2 HOURS AS NEEDED FOR WHEEZING, SHORTNESS OF BREATH. 4050 mL 3  . isosorbide mononitrate (IMDUR) 30 MG 24 hr tablet Take 30 mg by mouth daily.    Marland Kitchen levothyroxine (SYNTHROID) 125 MCG tablet TAKE 1 TABLET BY MOUTH  DAILY BEFORE BREAKFAST 90 tablet 3  . metFORMIN (GLUCOPHAGE-XR) 500 MG 24 hr tablet TAKE 1 TABLET(500 MG) BY MOUTH DAILY WITH BREAKFAST 90 tablet 3  . Methotrexate Sodium (METHOTREXATE, PF,) 250 MG/10ML injection Use as directed    . Multiple Vitamins-Minerals (ICAPS AREDS 2) CAPS Take 1 capsule 2 (two) times daily by mouth.    . nitroGLYCERIN (NITROSTAT) 0.4 MG SL tablet DISSOLVE 1 TABLET UNDER THE TONGUE EVERY 5 MINUTES AS  NEEDED CHEST PAIN. NOT TO  EXCEED 3 TABLETS IN 15  MINUTES 25 tablet 4  . potassium chloride (K-DUR) 10 MEQ tablet Take 1 tablet (10 mEq total) by mouth 2 (two) times daily. 60 tablet 1  . Tiotropium Bromide-Olodaterol (STIOLTO RESPIMAT) 2.5-2.5 MCG/ACT AERS Inhale 2 puffs into the lungs daily. 4 g 5  . torsemide (DEMADEX) 20 MG tablet Take 1 tablet (20 mg total) by mouth 2 (two) times daily. 60 tablet 3  . triamcinolone cream (KENALOG) 0.1 % Apply 1 application topically as needed. Apply externally to affected area daily as needed for itching    . VESICARE  5 MG tablet Take 5 mg by mouth daily.    Marland Kitchen zolpidem (AMBIEN) 10 MG tablet TAKE 1 TABLET BY MOUTH EVERY DAY AT BEDTIME AS NEEDED FOR SLEEP 30 tablet 3   No facility-administered medications prior to visit.     No Known Allergies  ROS     Objective:    Physical Exam  Constitutional: He is oriented to person, place, and time. He appears well-developed and well-nourished.  HENT:  Head: Normocephalic and atraumatic.  Right Ear: External ear normal.  Left Ear: External ear normal.  Nose: Nose normal.  Mouth/Throat: Oropharynx is clear and moist.  TMs and canals are clear.   Eyes: Pupils are equal, round, and reactive to light. Conjunctivae and EOM are normal.  Neck: Neck supple. No thyromegaly present.  Cardiovascular: Normal rate and normal heart sounds.  Pulmonary/Chest: Effort normal and breath sounds normal.  Lymphadenopathy:    He has no cervical adenopathy.  Neurological: He is alert and oriented to person, place, and time.  Skin: Skin is warm and dry.  Psychiatric: He has a normal mood and affect.    BP (!) 129/51   Pulse 87   Ht '5\' 11"'  (1.803 m)   Wt 284 lb (128.8 kg)   SpO2 96%   BMI 39.61 kg/m  Wt Readings from Last 3 Encounters:  12/23/18 284 lb (128.8 kg)  12/12/18 281 lb 12.8 oz (127.8 kg)  11/30/18 281 lb 12.8 oz (127.8 kg)    Health Maintenance Due  Topic Date Due  . OPHTHALMOLOGY EXAM  08/01/1936  . URINE MICROALBUMIN  10/27/2018    There are no preventive care reminders to display for this patient.   Lab Results  Component Value Date   TSH 1.94 08/04/2018   Lab Results  Component Value Date   WBC 6.2 08/04/2018   HGB 12.7 (L) 08/04/2018   HCT 37.7 (L) 08/04/2018   MCV 93.1 08/04/2018   PLT 212 08/04/2018   Lab Results  Component Value Date   NA 144 12/12/2018   K 4.0 12/12/2018   CO2 30 (H) 12/12/2018   GLUCOSE 120 (H) 12/12/2018   BUN 26 12/12/2018   CREATININE 1.01 12/12/2018   BILITOT 0.4 05/09/2018   ALKPHOS 82 08/25/2017    AST 24 05/09/2018   ALT 20 05/09/2018   PROT 6.6 05/09/2018   ALBUMIN 3.4 (L) 08/25/2017   CALCIUM 8.7 12/12/2018   ANIONGAP 8 09/04/2017   GFR 89.45 05/29/2015   Lab Results  Component Value Date   CHOL 158 03/07/2018   Lab Results  Component Value Date   HDL 62 03/07/2018   Lab Results  Component Value Date   LDLCALC 80 03/07/2018   Lab Results  Component Value Date   TRIG 79 03/07/2018   Lab Results  Component Value Date   CHOLHDL 2.5 03/07/2018   Lab Results  Component Value Date   HGBA1C 6.8 (A) 07/04/2018       Assessment & Plan:   Problem List Items Addressed This Visit    None    Visit Diagnoses    Cough    -  Primary   Relevant Orders   Novel Coronavirus, NAA ([facility])   CBC with Differential/Platelet   COMPLETE METABOLIC PANEL WITH GFR   B Nat Peptide   SOB (shortness of breath)       Relevant Orders   CBC with Differential/Platelet   COMPLETE METABOLIC PANEL WITH GFR   B Nat Peptide   Atypical chest pain       Relevant Orders   CBC with Differential/Platelet   COMPLETE METABOLIC PANEL WITH GFR   B Nat Peptide   Sore throat       Relevant Orders   CBC with Differential/Platelet   COMPLETE METABOLIC PANEL WITH GFR   B Nat Peptide      Cough sore throat and fatigue-possible viral illness.  We did do a self swab for COVID since he was already here for the office visit.  I am also going to check a CBC to look for elevated white blood cell count as well as a BNP to see if it looks like he may be in more acute heart failure.  I am concerned that his weight is up about 4 pounds.  Some can increase his Bumex to 2 tabs in the morning and 1 in the afternoon for the next 2 to 3 days and like to see his weight get back down to 278 pounds on his home scale and I wrote that down for him.  Chest pain, atypical-he does get this on and off so not particularly worried about an acute event but again we will check some lab work today.  I am more concerned about  the possibility of acute infection.  Fatigue-see additional work-up as noted above.  Shortness of breath-pulse ox is reassuring.  We will get some additional labs.  Lung exam was actually normal today so I did hold off on chest x-ray.   No orders of the defined types were placed in this  encounter.    Beatrice Lecher, MD

## 2018-12-23 NOTE — Patient Instructions (Signed)
Increase your torsemide to 2 tabs in the morning and one in the afternoon for 2-3 days until your weight is back down to 278 lbs at home.  Call on Monday and let us know if you are feeling better.

## 2018-12-23 NOTE — Progress Notes (Signed)
Called [facility] [phone] #[id number] Conf#[id number].Bryan Hatfield, [place]

## 2018-12-24 LAB — COMPLETE METABOLIC PANEL WITH GFR
AG Ratio: 1.7 (calc) (ref 1.0–2.5)
ALT: 48 U/L — ABNORMAL HIGH (ref 9–46)
AST: 39 U/L — ABNORMAL HIGH (ref 10–35)
Albumin: 3.9 g/dL (ref 3.6–5.1)
Alkaline phosphatase (APISO): 80 U/L (ref 35–144)
BUN/Creatinine Ratio: 26 (calc) — ABNORMAL HIGH (ref 6–22)
BUN: 27 mg/dL — ABNORMAL HIGH (ref 7–25)
CO2: 33 mmol/L — ABNORMAL HIGH (ref 20–32)
Calcium: 8.7 mg/dL (ref 8.6–10.3)
Chloride: 102 mmol/L (ref 98–110)
Creat: 1.05 mg/dL (ref 0.70–1.11)
GFR, Est African American: 72 mL/min/{1.73_m2} (ref >=60)
GFR, Est Non African American: 62 mL/min/{1.73_m2} (ref >=60)
Globulin: 2.3 g/dL (calc) (ref 1.9–3.7)
Glucose, Bld: 171 mg/dL — ABNORMAL HIGH (ref 65–99)
Potassium: 3.9 mmol/L (ref 3.5–5.3)
Sodium: 141 mmol/L (ref 135–146)
Total Bilirubin: 0.5 mg/dL (ref 0.2–1.2)
Total Protein: 6.2 g/dL (ref 6.1–8.1)

## 2018-12-24 LAB — CBC WITH DIFFERENTIAL/PLATELET
Absolute Monocytes: 350 cells/uL (ref 200–950)
Basophils Absolute: 21 cells/uL (ref 0–200)
Basophils Relative: 0.3 %
Eosinophils Absolute: 203 cells/uL (ref 15–500)
Eosinophils Relative: 2.9 %
HCT: 39.8 % (ref 38.5–50.0)
Hemoglobin: 13.2 g/dL (ref 13.2–17.1)
Lymphs Abs: 1330 cells/uL (ref 850–3900)
MCH: 31.5 pg (ref 27.0–33.0)
MCHC: 33.2 g/dL (ref 32.0–36.0)
MCV: 95 fL (ref 80.0–100.0)
MPV: 9.7 fL (ref 7.5–12.5)
Monocytes Relative: 5 %
Neutro Abs: 5096 cells/uL (ref 1500–7800)
Neutrophils Relative %: 72.8 %
Platelets: 198 10*3/uL (ref 140–400)
RBC: 4.19 10*6/uL — ABNORMAL LOW (ref 4.20–5.80)
RDW: 14.6 % (ref 11.0–15.0)
Total Lymphocyte: 19 %
WBC: 7 10*3/uL (ref 3.8–10.8)

## 2018-12-24 LAB — BRAIN NATRIURETIC PEPTIDE: Brain Natriuretic Peptide: 80 pg/mL (ref <100)

## 2018-12-28 ENCOUNTER — Telehealth: Payer: Self-pay | Admitting: Family Medicine

## 2018-12-28 LAB — SPECIMEN STATUS REPORT

## 2018-12-28 LAB — NOVEL CORONAVIRUS, NAA: SARS-CoV-2, NAA: NOT DETECTED

## 2018-12-28 NOTE — Telephone Encounter (Signed)
Left pt msg on home phone advising of results

## 2018-12-28 NOTE — Telephone Encounter (Signed)
please call to find out the results of his COVID testing.  I swabbed him on Friday.  And I have already gotten a result back of a patient that I swabbed on Monday.  So I am not sure why his has not returned.

## 2018-12-28 NOTE — Telephone Encounter (Signed)
Called [facility] and spoke to [employer]. Results state NOT DETECTED. She is faxing copy of results.

## 2018-12-28 NOTE — Telephone Encounter (Signed)
Ok, please call pt ane let know he is negative for COVID.

## 2018-12-30 ENCOUNTER — Telehealth: Payer: Self-pay

## 2018-12-30 NOTE — Telephone Encounter (Signed)
YOUR CARDIOLOGY TEAM HAS ARRANGED FOR AN E-VISIT FOR YOUR APPOINTMENT - PLEASE REVIEW IMPORTANT INFORMATION BELOW SEVERAL DAYS PRIOR TO YOUR APPOINTMENT ° °Due to the recent COVID-19 pandemic, we are transitioning in-person office visits to tele-medicine visits in an effort to decrease unnecessary exposure to our patients, their families, and staff. These visits are billed to your insurance just like a normal visit is. We also encourage you to sign up for MyChart if you have not already done so. You will need a smartphone if possible. For patients that do not have this, we can still complete the visit using a regular telephone but do prefer a smartphone to enable video when possible. You may have a family member that lives with you that can help. If possible, we also ask that you have a blood pressure cuff and scale at home to measure your blood pressure, heart rate and weight prior to your scheduled appointment. Patients with clinical needs that need an in-person evaluation and testing will still be able to come to the office if absolutely necessary. If you have any questions, feel free to call our office. ° ° ° ° °YOUR PROVIDER WILL BE USING THE FOLLOWING PLATFORM TO COMPLETE YOUR VISIT: Phone Call ° °• IF USING MYCHART - How to Download the MyChart App to Your SmartPhone  ° °- If Apple, go to App Store and type in MyChart in the search bar and download the app. If Android, ask patient to go to Google Play Store and type in MyChart in the search bar and download the app. The app is free but as with any other app downloads, your phone may require you to verify saved payment information or Apple/Android password.  °- You will need to then log into the app with your MyChart username and password, and select [facility] as your healthcare provider to link the account.  °- When it is time for your visit, go to the MyChart app, find appointments, and click Begin Video Visit. Be sure to Select Allow for your device to  access the Microphone and Camera for your visit. You will then be connected, and your provider will be with you shortly. ° **If you have any issues connecting or need assistance, please contact MyChart service desk [phone] ([phone])** ° **If using a computer, in order to ensure the best quality for your visit, you will need to use either of the following Internet Browsers: Google Chrome or Microsoft Edge** ° °• IF USING DOXIMITY or [email or web address] - The staff will give you instructions on receiving your link to join the meeting the day of your visit.  ° ° ° ° °2-3 DAYS BEFORE YOUR APPOINTMENT ° °You will receive a telephone call from one of our [facility] team members - your caller ID may say "Unknown caller." If this is a video visit, we will walk you through how to get the video launched on your phone. We will remind you check your blood pressure, heart rate and weight prior to your scheduled appointment. If you have an Apple Watch or Kardia, please upload any pertinent ECG strips the day before or morning of your appointment to MyChart. Our staff will also make sure you have reviewed the consent and agree to move forward with your scheduled tele-health visit.  ° ° ° °THE DAY OF YOUR APPOINTMENT ° °Approximately 15 minutes prior to your scheduled appointment, you will receive a telephone call from one of [facility] team - your caller ID may say "Unknown caller."    Our staff will confirm medications, vital signs for the day and any symptoms you may be experiencing. Please have this information available prior to the time of visit start. It may also be helpful for you to have a pad of paper and pen handy for any instructions given during your visit. They will also walk you through joining the smartphone meeting if this is a video visit.    CONSENT FOR TELE-HEALTH VISIT - PLEASE REVIEW  I hereby voluntarily request, consent and authorize [facility] and its employed or contracted physicians, physician  assistants, nurse practitioners or other licensed health care professionals (the Practitioner), to provide me with telemedicine health care services (the "Services") as deemed necessary by the treating Practitioner. I acknowledge and consent to receive the Services by the Practitioner via telemedicine. I understand that the telemedicine visit will involve communicating with the Practitioner through live audiovisual communication technology and the disclosure of certain medical information by electronic transmission. I acknowledge that I have been given the opportunity to request an in-person assessment or other available alternative prior to the telemedicine visit and am voluntarily participating in the telemedicine visit.  I understand that I have the right to withhold or withdraw my consent to the use of telemedicine in the course of my care at any time, without affecting my right to future care or treatment, and that the Practitioner or I may terminate the telemedicine visit at any time. I understand that I have the right to inspect all information obtained and/or recorded in the course of the telemedicine visit and may receive copies of available information for a reasonable fee.  I understand that some of the potential risks of receiving the Services via telemedicine include:  . Delay or interruption in medical evaluation due to technological equipment failure or disruption; . Information transmitted may not be sufficient (e.g. poor resolution of images) to allow for appropriate medical decision making by the Practitioner; and/or  . In rare instances, security protocols could fail, causing a breach of personal health information.  Furthermore, I acknowledge that it is my responsibility to provide information about my medical history, conditions and care that is complete and accurate to the best of my ability. I acknowledge that Practitioner's advice, recommendations, and/or decision may be based on  factors not within their control, such as incomplete or inaccurate data provided by me or distortions of diagnostic images or specimens that may result from electronic transmissions. I understand that the practice of medicine is not an exact science and that Practitioner makes no warranties or guarantees regarding treatment outcomes. I acknowledge that I will receive a copy of this consent concurrently upon execution via email to the email address I last provided but may also request a printed copy by calling the office of [facility].    I understand that my insurance will be billed for this visit.   I have read or had this consent read to me. . I understand the contents of this consent, which adequately explains the benefits and risks of the Services being provided via telemedicine.  . I have been provided ample opportunity to ask questions regarding this consent and the Services and have had my questions answered to my satisfaction. . I give my informed consent for the services to be provided through the use of telemedicine in my medical care  By participating in this telemedicine visit I agree to the above.  

## 2019-01-05 DIAGNOSIS — R531 Weakness: Secondary | ICD-10-CM | POA: Diagnosis not present

## 2019-01-05 DIAGNOSIS — L299 Pruritus, unspecified: Secondary | ICD-10-CM | POA: Diagnosis not present

## 2019-01-05 DIAGNOSIS — E785 Hyperlipidemia, unspecified: Secondary | ICD-10-CM | POA: Diagnosis not present

## 2019-01-13 DIAGNOSIS — M2041 Other hammer toe(s) (acquired), right foot: Secondary | ICD-10-CM | POA: Diagnosis not present

## 2019-01-13 DIAGNOSIS — M79671 Pain in right foot: Secondary | ICD-10-CM | POA: Diagnosis not present

## 2019-01-13 DIAGNOSIS — M7751 Other enthesopathy of right foot: Secondary | ICD-10-CM | POA: Diagnosis not present

## 2019-01-13 DIAGNOSIS — M79672 Pain in left foot: Secondary | ICD-10-CM | POA: Diagnosis not present

## 2019-01-15 NOTE — Progress Notes (Signed)
Virtual Visit via Video Note   This visit type was conducted due to national recommendations for restrictions regarding the COVID-19 Pandemic (e.g. social distancing) in an effort to limit this patient's exposure and mitigate transmission in our community.  Due to his co-morbid illnesses, this patient is at least at moderate risk for complications without adequate follow up.  This format is felt to be most appropriate for this patient at this time.  All issues noted in this document were discussed and addressed.  A limited physical exam was performed with this format.  Please refer to the patient's chart for his consent to telehealth for [facility].   Date:  01/15/2019   ID:  Bryan Hatfield, DOB 11-Nov-1926, MRN [id number]  Patient Location: Home Provider Location: Office  PCP:  [name], MD  Cardiologist:  Sinclair Grooms, MD  Electrophysiologist:  None   Evaluation Performed:  Follow-Up Visit  Chief Complaint:  CHF  History of Present Illness:    Bryan Hatfield is a 85 y.o. male with  a hx of CAD (s/p PTCA 2001 with moderate disease by cath 2017), HTN, HLD, hyperglycemia with A1C 7.5 in 06/2017 (previously pre-diabetic by A1Cs), COPD, OSA on CPAP, parapneumonic pleural effusion r/t PNA s/p thoracentesis 2008, h/o asbestos exposure, remote tobacco abuse (only 5 year history of smoking), probable chronic diastolic CHF improved on torsemide.  He is noting gradual increase in energy.  Lower extremity edema and shortness of breath have improved since the diuretic regimen was switched from furosemide to torsemide.  Weight is gradually coming down.  Overall he feels confident that things have improved.  I reviewed laboratory data done in July.  We discussed low-salt diet.  The patient does not have symptoms concerning for COVID-19 infection (fever, chills, cough, or new shortness of breath).    Past Medical History:  Diagnosis Date  . Acute respiratory failure ([place]) 09/25/11   hypoxic  . Anxiety   . Arthritis   . Bronchitis    "seems like 2x/year"  . CAD (coronary artery disease)    a. PTCA 2001. b. f/u cath 05/2012 heavy calcification, no PCI required. c. f/u cath for recurrent sx 2017: stable mod CAD unchanged from prior, mild progression of small vessel disease, elevated LVEDP.  . Colon polyps   . COPD (chronic obstructive pulmonary disease) ([place])   . Diabetes mellitus without complication ([place])   . GERD (gastroesophageal reflux disease)   . High cholesterol   . Hyperglycemia   . Hypertension   . Hypothyroidism   . Peripheral vascular disease ([place])    patient denies knowledge of, but listed in his chart  . Pleural effusion    Parapneumonic ?r/t PNA s/p thoracentesis 2008  . Pneumonia    hx  . Sleep apnea    cpap    Past Surgical History:  Procedure Laterality Date  . CARDIAC CATHETERIZATION N/A 11/25/2015   Procedure: Left Heart Cath and Coronary Angiography;  Surgeon: Leonie Man, MD;  Location: [place] [facility];  Service: Cardiovascular;  Laterality: N/A;  . CATARACT EXTRACTION W/ INTRAOCULAR LENS  IMPLANT, BILATERAL  ~ 2006  . CHOLECYSTECTOMY N/A 10/11/2012   Procedure: LAPAROSCOPIC CHOLECYSTECTOMY WITH INTRAOPERATIVE CHOLANGIOGRAM;  Surgeon: Adin Hector, MD;  Location: [place];  Service: General;  Laterality: N/A;  . COLONOSCOPY    . CORONARY ANGIOPLASTY  01/2000  . HEMORRHOID SURGERY  1960's  . KNEE ARTHROSCOPY Left 04/19/2017   Procedure: ARTHROSCOPY KNEE, SUBCHONDROPLASTY;  Surgeon:  Dorna Leitz, MD;  Location: [place];  Service: Orthopedics;  Laterality: Left;  . KNEE ARTHROSCOPY WITH MEDIAL MENISECTOMY Left 04/19/2017   Procedure: KNEE ARTHROSCOPY WITH MEDIAL AND LATERAL MENISECTOMY;  Surgeon: Dorna Leitz, MD;  Location: [place];  Service: Orthopedics;  Laterality: Left;  . LAPAROSCOPIC RIGHT COLECTOMY N/A 08/31/2017   Procedure: LAPAROSCOPIC ASSISTED ASCENDING COLECTOMY;  Surgeon: Fanny Skates, MD;  Location: [place];  Service:  General;  Laterality: N/A;     No outpatient medications have been marked as taking for the 01/17/19 encounter (Appointment) with Belva Crome, MD.     Allergies:   Patient has no known allergies.   Social History   Tobacco Use  . Smoking status: Former Smoker    Packs/day: 1.00    Years: 10.00    Pack years: 10.00    Types: Cigarettes    Quit date: 06/01/1982    Years since quitting: 36.6  . Smokeless tobacco: Never Used  . Tobacco comment: "quit smoking ~ 25 years ago; smoked ~ 1 pk/wk"  Substance Use Topics  . Alcohol use: No  . Drug use: No     Family Hx: The patient's family history includes Arthritis in his mother and sister; Asthma in his mother; Emphysema in his mother; Stroke in his father, mother, and sister. There is no history of Heart attack.  ROS:   Please see the history of present illness.    Not adhering to a low-salt diet.  Redness in lower extremities has resolved.  Denies chills and fever. All other systems reviewed and are negative.   Prior CV studies:   The following studies were reviewed today:  No new imaging studies have been performed  Labs/Other Tests and Data Reviewed:    EKG:  From November 30, 2018 reveals sinus rhythm with left axis deviation.  No acute ST-T wave change was seen.  Recent Labs: 08/04/2018: TSH 1.94 11/30/2018: NT-Pro BNP 119 12/23/2018: ALT 48; Brain Natriuretic Peptide 80; BUN 27; Creat 1.05; Hemoglobin 13.2; Platelets 198; Potassium 3.9; Sodium 141   Recent Lipid Panel Lab Results  Component Value Date/Time   CHOL 158 03/07/2018 08:14 AM   TRIG 79 03/07/2018 08:14 AM   HDL 62 03/07/2018 08:14 AM   CHOLHDL 2.5 03/07/2018 08:14 AM   LDLCALC 80 03/07/2018 08:14 AM    Wt Readings from Last 3 Encounters:  12/23/18 284 lb (128.8 kg)  12/12/18 281 lb 12.8 oz (127.8 kg)  11/30/18 281 lb 12.8 oz (127.8 kg)     Objective:    Vital Signs:  There were no vitals taken for this visit.   VITAL SIGNS:  reviewed  ASSESSMENT &  PLAN:    1. Coronary artery disease involving native coronary artery of native heart with angina pectoris ([place])   2. Chronic diastolic CHF (congestive heart failure) ([place])   3. Essential hypertension, benign   4. Hyperlipidemia, unspecified hyperlipidemia type   5. COPD GOLD 0/I   6. Educated About Covid-19 Virus Infection    PLAN:  1. No symptoms to suggest angina. 2. Improvement in overall volume status with switch to torsemide from furosemide. 3. Blood pressure based upon today's data, is good. 4. LDL target should be less than 70. 5. Not addressed 6. Social distancing, mask wearing, and hand cleaning is stressed.  He will have a clinical follow-up in 2 to 3 months.  A basic metabolic panel will be done in September to ensure that kidney function and potassium are adequate on current therapy.  COVID-19 Education: The signs and symptoms of COVID-19 were discussed with the patient and how to seek care for testing (follow up with PCP or arrange E-visit).  The importance of social distancing was discussed today.  Time:   Today, I have spent 15 minutes with the patient with telehealth technology discussing the above problems.     Medication Adjustments/Labs and Tests Ordered: Current medicines are reviewed at length with the patient today.  Concerns regarding medicines are outlined above.   Tests Ordered: No orders of the defined types were placed in this encounter.   Medication Changes: No orders of the defined types were placed in this encounter.   Follow Up:  In Person in 3 month(s)  Signed, Sinclair Grooms, MD  01/15/2019 3:30 PM    [place]

## 2019-01-16 ENCOUNTER — Other Ambulatory Visit: Payer: Self-pay | Admitting: *Deleted

## 2019-01-17 ENCOUNTER — Encounter: Payer: Self-pay | Admitting: Interventional Cardiology

## 2019-01-17 ENCOUNTER — Other Ambulatory Visit: Payer: Self-pay

## 2019-01-17 ENCOUNTER — Telehealth (INDEPENDENT_AMBULATORY_CARE_PROVIDER_SITE_OTHER): Payer: Medicare Other | Admitting: Interventional Cardiology

## 2019-01-17 VITALS — BP 120/65 | Ht 71.0 in | Wt 278.0 lb

## 2019-01-17 DIAGNOSIS — I25119 Atherosclerotic heart disease of native coronary artery with unspecified angina pectoris: Secondary | ICD-10-CM | POA: Diagnosis not present

## 2019-01-17 DIAGNOSIS — Z7189 Other specified counseling: Secondary | ICD-10-CM

## 2019-01-17 DIAGNOSIS — E785 Hyperlipidemia, unspecified: Secondary | ICD-10-CM

## 2019-01-17 DIAGNOSIS — J449 Chronic obstructive pulmonary disease, unspecified: Secondary | ICD-10-CM

## 2019-01-17 DIAGNOSIS — I5032 Chronic diastolic (congestive) heart failure: Secondary | ICD-10-CM

## 2019-01-17 DIAGNOSIS — I10 Essential (primary) hypertension: Secondary | ICD-10-CM

## 2019-01-17 NOTE — Patient Instructions (Signed)
Medication Instructions:  Your physician recommends that you continue on your current medications as directed. Please refer to the Current Medication list given to you today.  If you need a refill on your cardiac medications before your next appointment, please call your pharmacy.   Lab work: BMET in September  If you have labs (blood work) drawn today and your tests are completely normal, you will receive your results only by: Marland Kitchen MyChart Message (if you have MyChart) OR . A paper copy in the mail If you have any lab test that is abnormal or we need to change your treatment, we will call you to review the results.  Testing/Procedures: None  Follow-Up: At [facility], you and your health needs are our priority.  As part of our continuing mission to provide you with exceptional heart care, we have created designated Provider Care Teams.  These Care Teams include your primary Cardiologist (physician) and Advanced Practice Providers (APPs -  Physician Assistants and Nurse Practitioners) who all work together to provide you with the care you need, when you need it. You will need a follow up appointment in 2-3 months.  Please call our office 2 months in advance to schedule this appointment.  You may see Sinclair Grooms, MD or one of the following Advanced Practice Providers on your designated Care Team:   Truitt Merle, NP Cecilie Kicks, NP . Kathyrn Drown, NP  Any Other Special Instructions Will Be Listed Below (If Applicable).

## 2019-01-23 ENCOUNTER — Encounter: Payer: Self-pay | Admitting: Family Medicine

## 2019-01-23 ENCOUNTER — Other Ambulatory Visit: Payer: Self-pay

## 2019-01-23 ENCOUNTER — Ambulatory Visit (INDEPENDENT_AMBULATORY_CARE_PROVIDER_SITE_OTHER): Payer: Medicare Other | Admitting: Family Medicine

## 2019-01-23 VITALS — BP 130/55 | HR 80 | Temp 98.7°F | Ht 71.0 in | Wt 261.0 lb

## 2019-01-23 DIAGNOSIS — Z23 Encounter for immunization: Secondary | ICD-10-CM | POA: Diagnosis not present

## 2019-01-23 DIAGNOSIS — E1169 Type 2 diabetes mellitus with other specified complication: Secondary | ICD-10-CM | POA: Diagnosis not present

## 2019-01-23 DIAGNOSIS — I25119 Atherosclerotic heart disease of native coronary artery with unspecified angina pectoris: Secondary | ICD-10-CM | POA: Diagnosis not present

## 2019-01-23 DIAGNOSIS — E669 Obesity, unspecified: Secondary | ICD-10-CM

## 2019-01-23 DIAGNOSIS — I10 Essential (primary) hypertension: Secondary | ICD-10-CM

## 2019-01-23 LAB — POCT GLYCOSYLATED HEMOGLOBIN (HGB A1C): Hemoglobin A1C: 6.4 % — AB (ref 4.0–5.6)

## 2019-01-23 MED ORDER — TORSEMIDE 20 MG PO TABS: 20.0000 mg | ORAL_TABLET | Freq: Two times a day (BID) | ORAL | 1 refills | Status: DC

## 2019-01-23 NOTE — Assessment & Plan Note (Signed)
Well controlled. Continue current regimen. Follow up in  6 mo  

## 2019-01-23 NOTE — Progress Notes (Signed)
Established Patient Office Visit  Subjective:  Patient ID: Bryan Hatfield, male    DOB: 10-14-1926  Age: 85 y.o. MRN: [id number]  CC:  Chief Complaint  Patient presents with  . Diabetes    HPI  [name] presents for   Diabetes - no hypoglycemic events. No wounds or sores that are not healing well. No increased thirst or urination. Checking glucose at home. Taking medications as prescribed without any side effects.  Also saw him in July for increase shortness of breath and fatigue.  Been sleeping more than usual.  Weight had also gone up a couple pounds.  He also complained of cough and sore throat but did test negative for COVID.  Severe obesity/BMI 36-he has actually lost about 20 pounds since I last saw him.  He is is been really cutting back and trying to purposely lose weight.  He said his cardiologist really encouraged him to try to lose some weight.  He has a follow-up this fall.  He did want to let me know that when he went for his driver's license renewal he had to take a road test.  I did limit him to local driving only no highway and no more than 45 miles an hour.  He says he does not drive on the highway anyway.  Though he is supposed to go to [employer] for some lab work and wants to see if he could do it here locally.  Past Medical History:  Diagnosis Date  . Acute respiratory failure ([place]) 09/25/11   hypoxic  . Anxiety   . Arthritis   . Bronchitis    "seems like 2x/year"  . CAD (coronary artery disease)    a. PTCA 2001. b. f/u cath 05/2012 heavy calcification, no PCI required. c. f/u cath for recurrent sx 2017: stable mod CAD unchanged from prior, mild progression of small vessel disease, elevated LVEDP.  . Colon polyps   . COPD (chronic obstructive pulmonary disease) ([place])   . Diabetes mellitus without complication ([place])   . GERD (gastroesophageal reflux disease)   . High cholesterol   . Hyperglycemia   . Hypertension   . Hypothyroidism   . Peripheral  vascular disease ([place])    patient denies knowledge of, but listed in his chart  . Pleural effusion    Parapneumonic ?r/t PNA s/p thoracentesis 2008  . Pneumonia    hx  . Sleep apnea    cpap     Past Surgical History:  Procedure Laterality Date  . CARDIAC CATHETERIZATION N/A 11/25/2015   Procedure: Left Heart Cath and Coronary Angiography;  Surgeon: Leonie Man, MD;  Location: [place] [facility];  Service: Cardiovascular;  Laterality: N/A;  . CATARACT EXTRACTION W/ INTRAOCULAR LENS  IMPLANT, BILATERAL  ~ 2006  . CHOLECYSTECTOMY N/A 10/11/2012   Procedure: LAPAROSCOPIC CHOLECYSTECTOMY WITH INTRAOPERATIVE CHOLANGIOGRAM;  Surgeon: Adin Hector, MD;  Location: [place];  Service: General;  Laterality: N/A;  . COLONOSCOPY    . CORONARY ANGIOPLASTY  01/2000  . HEMORRHOID SURGERY  1960's  . KNEE ARTHROSCOPY Left 04/19/2017   Procedure: ARTHROSCOPY KNEE, SUBCHONDROPLASTY;  Surgeon: Dorna Leitz, MD;  Location: [place];  Service: Orthopedics;  Laterality: Left;  . KNEE ARTHROSCOPY WITH MEDIAL MENISECTOMY Left 04/19/2017   Procedure: KNEE ARTHROSCOPY WITH MEDIAL AND LATERAL MENISECTOMY;  Surgeon: Dorna Leitz, MD;  Location: [place];  Service: Orthopedics;  Laterality: Left;  . LAPAROSCOPIC RIGHT COLECTOMY N/A 08/31/2017   Procedure: LAPAROSCOPIC ASSISTED ASCENDING COLECTOMY;  Surgeon:  Fanny Skates, MD;  [facility];  Service: General;  Laterality: N/A;    Family History  Problem Relation Age of Onset  . Stroke Mother   . Arthritis Mother   . Asthma Mother   . Emphysema Mother   . Stroke Father   . Arthritis Sister   . Stroke Sister   . Heart attack Neg Hx     Social History   Socioeconomic History  . Marital status: Married    Spouse name: Not on file  . Number of children: Not on file  . Years of education: Not on file  . Highest education level: Not on file  Occupational History  . 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 Financial  resource strain: Not on file  . Food insecurity    Worry: Not on file    Inability: Not on file  . Transportation needs    Medical: Not on file    Non-medical: Not on file  Tobacco Use  . Smoking status: Former Smoker    Packs/day: 1.00    Years: 10.00    Pack years: 10.00    Types: Cigarettes    Quit date: 06/01/1982    Years since quitting: 36.6  . Smokeless tobacco: Never Used  . Tobacco comment: "quit smoking ~ 25 years ago; smoked ~ 1 pk/wk"  Substance and Sexual Activity  . Alcohol use: No  . Drug use: No  . Sexual activity: Never  Lifestyle  . Physical activity    Days per week: Not on file    Minutes per session: Not on file  . Stress: Not on file  Relationships  . Social Herbalist on phone: Not on file    Gets together: Not on file    Attends religious service: Not on file    Active member of club or organization: Not on file    Attends meetings of clubs or organizations: Not on file    Relationship status: Not on file  . Intimate partner violence    Fear of current or ex partner: Not on file    Emotionally abused: Not on file    Physically abused: Not on file    Forced sexual activity: Not on file  Other Topics Concern  . Not on file  Social History Narrative  . Not on file    Outpatient Medications Prior to Visit  Medication Sig Dispense Refill  . albuterol (PROVENTIL HFA;VENTOLIN HFA) 108 (90 Base) MCG/ACT inhaler Inhale 2 puffs every 6 (six) hours as needed into the lungs for wheezing or shortness of breath.    . AMBULATORY NON FORMULARY MEDICATION Medication Name: 4 prong cane. Dx: gait instability 1 vial 0  . Ascorbic Acid (VITAMIN C PO) Take 1 tablet by mouth daily.    Marland Kitchen aspirin EC 81 MG tablet Take 81 mg by mouth daily.      Marland Kitchen atorvastatin (LIPITOR) 40 MG tablet TAKE 1 TABLET BY MOUTH AT  BEDTIME 90 tablet 2  . Blood Glucose Monitoring Suppl (ONE TOUCH ULTRA 2) w/Device KIT Onetouch ultra kit. DX: E11.9 1 each 0  . cetirizine (ZYRTEC) 10  MG tablet Take 10 mg by mouth daily.    Marland Kitchen escitalopram (LEXAPRO) 10 MG tablet TAKE 1 TABLET BY MOUTH  DAILY 90 tablet 1  . fluticasone (FLONASE) 50 MCG/ACT nasal spray One spray in each nostril twice a day, use left hand for right nostril, and  right hand for left nostril. 48 g 3  . folic acid (FOLVITE) 1 MG tablet Take 1 mg by mouth daily.    . glucose blood (ONE TOUCH ULTRA TEST) test strip For testing blood sugars twice daily.E11.9 200 each 12  . ipratropium-albuterol (DUONEB) 0.5-2.5 (3) MG/3ML SOLN USE 1 VIAL VIA NEBULIZER EVERY 2 HOURS AS NEEDED FOR WHEEZING, SHORTNESS OF BREATH. 4050 mL 3  . levothyroxine (SYNTHROID) 125 MCG tablet TAKE 1 TABLET BY MOUTH  DAILY BEFORE BREAKFAST 90 tablet 3  . metFORMIN (GLUCOPHAGE-XR) 500 MG 24 hr tablet TAKE 1 TABLET(500 MG) BY MOUTH DAILY WITH BREAKFAST 90 tablet 3  . Multiple Vitamins-Minerals (ICAPS AREDS 2) CAPS Take 1 capsule 2 (two) times daily by mouth.    . nitroGLYCERIN (NITROSTAT) 0.4 MG SL tablet DISSOLVE 1 TABLET UNDER THE TONGUE EVERY 5 MINUTES AS  NEEDED CHEST PAIN. NOT TO  EXCEED 3 TABLETS IN 15  MINUTES 25 tablet 4  . oxybutynin (DITROPAN-XL) 10 MG 24 hr tablet Take 10 mg by mouth at bedtime.    . potassium chloride (K-DUR) 10 MEQ tablet Take 1 tablet (10 mEq total) by mouth 2 (two) times daily. 60 tablet 1  . Tiotropium Bromide-Olodaterol (STIOLTO RESPIMAT) 2.5-2.5 MCG/ACT AERS Inhale 2 puffs into the lungs daily. 4 g 5  . zolpidem (AMBIEN) 10 MG tablet TAKE 1 TABLET BY MOUTH EVERY DAY AT BEDTIME AS NEEDED FOR SLEEP 30 tablet 3  . torsemide (DEMADEX) 20 MG tablet Take 1 tablet (20 mg total) by mouth 2 (two) times daily. 60 tablet 3  . clobetasol ointment (TEMOVATE) 0.05 % Apply 1 application topically 2 (two) times daily. 120 g 1  . triamcinolone cream (KENALOG) 0.1 % Apply 1 application topically as needed. Apply externally to affected area daily as needed for itching     No facility-administered medications prior to visit.     No Known  Allergies  ROS Review of Systems    Objective:    Physical Exam  BP (!) 130/55   Pulse 80   Temp 98.7 F (37.1 C)   Ht 5' 11" (1.803 m)   Wt 261 lb (118.4 kg)   SpO2 98%   BMI 36.40 kg/m  Wt Readings from Last 3 Encounters:  01/23/19 261 lb (118.4 kg)  01/17/19 278 lb (126.1 kg)  12/23/18 284 lb (128.8 kg)     Health Maintenance Due  Topic Date Due  . OPHTHALMOLOGY EXAM  08/01/1936  . URINE MICROALBUMIN  10/27/2018  . INFLUENZA VACCINE  12/31/2018  . HEMOGLOBIN A1C  01/02/2019    There are no preventive care reminders to display for this patient.  Lab Results  Component Value Date   TSH 1.94 08/04/2018   Lab Results  Component Value Date   WBC 7.0 12/23/2018   HGB 13.2 12/23/2018   HCT 39.8 12/23/2018   MCV 95.0 12/23/2018   PLT 198 12/23/2018   Lab Results  Component Value Date   NA 141 12/23/2018   K 3.9 12/23/2018   CO2 33 (H) 12/23/2018   GLUCOSE 171 (H) 12/23/2018   BUN 27 (H) 12/23/2018   CREATININE 1.05 12/23/2018   BILITOT 0.5 12/23/2018   ALKPHOS 82 08/25/2017   AST 39 (H) 12/23/2018   ALT 48 (H) 12/23/2018   PROT 6.2 12/23/2018   ALBUMIN 3.4 (L) 08/25/2017   CALCIUM 8.7 12/23/2018   ANIONGAP 8 09/04/2017   GFR 89.45 05/29/2015   Lab Results  Component Value Date   CHOL 158 03/07/2018     Lab Results  Component Value Date   HDL 62 03/07/2018   Lab Results  Component Value Date   LDLCALC 80 03/07/2018   Lab Results  Component Value Date   TRIG 79 03/07/2018   Lab Results  Component Value Date   CHOLHDL 2.5 03/07/2018   Lab Results  Component Value Date   HGBA1C 6.4 (A) 01/23/2019      Assessment & Plan:   Problem List Items Addressed This Visit      Cardiovascular and Mediastinum   Essential hypertension, benign (Chronic)    Well controlled. Continue current regimen. Follow up in 6 mo.         Relevant Medications   torsemide (DEMADEX) 20 MG tablet   Coronary artery disease involving native coronary artery  of native heart with angina pectoris ([place])    Congratulated on his attempts at weight loss.  This is fantastic.  Encouraged him to just continue to work at it.  He is not skipping any meals.  Blood pressure looks great today.  Keep follow-up with cardiology later this fall.  He is concerned about getting his blood work done since he is now limited in his driving to be able go to [place].  We will go ahead and order BMP for him to do in the next couple of weeks and we can fax it to Dr. Thompson Caul office once we receive that information back.      Relevant Medications   torsemide (DEMADEX) 20 MG tablet   Other Relevant Orders   BASIC METABOLIC PANEL WITH GFR   Lipid Panel w/reflex Direct LDL     Endocrine   Diabetes mellitus type 2 in obese ([place]) - Primary    Diabetes well controlled.  Continue current regimen.  Due for BMP and fasting lipid panel.  Follow-up in 3 to 4 months.      Relevant Orders   POCT glycosylated hemoglobin (Hb A1C) (Completed)   BASIC METABOLIC PANEL WITH GFR   Lipid Panel w/reflex Direct LDL     Other   Severe obesity (BMI 35.0-39.9) with comorbidity ([place])    He had done a great job with weight loss!!!  Just encouraged him to keep up the great work.  Especially as we move into the holidays.       Other Visit Diagnoses    Need for immunization against influenza       Relevant Orders   Flu Vaccine QUAD High Dose(Fluad) (Completed)      Meds ordered this encounter  Medications  . torsemide (DEMADEX) 20 MG tablet    Sig: Take 1 tablet (20 mg total) by mouth 2 (two) times daily.    Dispense:  180 tablet    Refill:  1    Follow-up: Return in about 3 months (around 04/25/2019) for Diabetes follow-up.    Beatrice Lecher, MD

## 2019-01-23 NOTE — Assessment & Plan Note (Signed)
He had done a great job with weight loss!!!  Just encouraged him to keep up the great work.  Especially as we move into the holidays.

## 2019-01-23 NOTE — Assessment & Plan Note (Signed)
Congratulated on his attempts at weight loss.  This is fantastic.  Encouraged him to just continue to work at it.  He is not skipping any meals.  Blood pressure looks great today.  Keep follow-up with cardiology later this fall.  He is concerned about getting his blood work done since he is now limited in his driving to be able go to [place].  We will go ahead and order BMP for him to do in the next couple of weeks and we can fax it to Dr. Thompson Caul office once we receive that information back.

## 2019-01-23 NOTE — Assessment & Plan Note (Signed)
Diabetes well controlled.  Continue current regimen.  Due for BMP and fasting lipid panel.  Follow-up in 3 to 4 months.

## 2019-01-25 DIAGNOSIS — E1169 Type 2 diabetes mellitus with other specified complication: Secondary | ICD-10-CM | POA: Diagnosis not present

## 2019-01-25 DIAGNOSIS — I25119 Atherosclerotic heart disease of native coronary artery with unspecified angina pectoris: Secondary | ICD-10-CM | POA: Diagnosis not present

## 2019-01-26 LAB — BASIC METABOLIC PANEL WITH GFR
BUN: 22 mg/dL (ref 7–25)
CO2: 32 mmol/L (ref 20–32)
Calcium: 8.6 mg/dL (ref 8.6–10.3)
Chloride: 100 mmol/L (ref 98–110)
Creat: 0.98 mg/dL (ref 0.70–1.11)
GFR, Est African American: 78 mL/min/{1.73_m2} (ref >=60)
GFR, Est Non African American: 68 mL/min/{1.73_m2} (ref >=60)
Glucose, Bld: 121 mg/dL — ABNORMAL HIGH (ref 65–99)
Potassium: 4.1 mmol/L (ref 3.5–5.3)
Sodium: 143 mmol/L (ref 135–146)

## 2019-01-26 LAB — LIPID PANEL W/REFLEX DIRECT LDL
Cholesterol: 150 mg/dL (ref <200)
HDL: 51 mg/dL (ref >=40)
LDL Cholesterol (Calc): 78 mg/dL (calc)
Non-HDL Cholesterol (Calc): 99 mg/dL (calc) (ref <130)
Total CHOL/HDL Ratio: 2.9 (calc) (ref <5.0)
Triglycerides: 128 mg/dL (ref <150)

## 2019-02-01 DIAGNOSIS — C4442 Squamous cell carcinoma of skin of scalp and neck: Secondary | ICD-10-CM | POA: Diagnosis not present

## 2019-02-01 DIAGNOSIS — L309 Dermatitis, unspecified: Secondary | ICD-10-CM | POA: Diagnosis not present

## 2019-02-01 DIAGNOSIS — B372 Candidiasis of skin and nail: Secondary | ICD-10-CM | POA: Diagnosis not present

## 2019-02-01 DIAGNOSIS — L299 Pruritus, unspecified: Secondary | ICD-10-CM | POA: Diagnosis not present

## 2019-02-04 ENCOUNTER — Other Ambulatory Visit: Payer: Self-pay | Admitting: Family Medicine

## 2019-02-09 ENCOUNTER — Other Ambulatory Visit: Payer: Medicare Other

## 2019-02-17 ENCOUNTER — Other Ambulatory Visit: Payer: Self-pay | Admitting: Physician Assistant

## 2019-02-22 DIAGNOSIS — R531 Weakness: Secondary | ICD-10-CM | POA: Diagnosis not present

## 2019-03-06 ENCOUNTER — Other Ambulatory Visit: Payer: Self-pay | Admitting: *Deleted

## 2019-03-06 MED ORDER — ESCITALOPRAM OXALATE 10 MG PO TABS: 10.0000 mg | ORAL_TABLET | Freq: Every day | ORAL | 1 refills | Status: DC

## 2019-03-08 DIAGNOSIS — L309 Dermatitis, unspecified: Secondary | ICD-10-CM | POA: Diagnosis not present

## 2019-03-08 DIAGNOSIS — L299 Pruritus, unspecified: Secondary | ICD-10-CM | POA: Diagnosis not present

## 2019-03-09 DIAGNOSIS — L299 Pruritus, unspecified: Secondary | ICD-10-CM | POA: Diagnosis not present

## 2019-03-09 DIAGNOSIS — R531 Weakness: Secondary | ICD-10-CM | POA: Diagnosis not present

## 2019-03-14 ENCOUNTER — Ambulatory Visit (INDEPENDENT_AMBULATORY_CARE_PROVIDER_SITE_OTHER): Payer: Medicare Other | Admitting: Family Medicine

## 2019-03-14 ENCOUNTER — Encounter: Payer: Self-pay | Admitting: Family Medicine

## 2019-03-14 DIAGNOSIS — J449 Chronic obstructive pulmonary disease, unspecified: Secondary | ICD-10-CM | POA: Diagnosis not present

## 2019-03-14 DIAGNOSIS — J441 Chronic obstructive pulmonary disease with (acute) exacerbation: Secondary | ICD-10-CM | POA: Diagnosis not present

## 2019-03-14 MED ORDER — PREDNISONE 20 MG PO TABS: 40.0000 mg | ORAL_TABLET | Freq: Every day | ORAL | 0 refills | Status: DC

## 2019-03-14 MED ORDER — AZITHROMYCIN 250 MG PO TABS: ORAL_TABLET | ORAL | 0 refills | Status: AC

## 2019-03-14 NOTE — Progress Notes (Signed)
Virtual Visit via Video Note  I connected with Bryan Hatfield on 03/14/19 at 11:10 AM EDT by a video enabled telemedicine application and verified that I am speaking with the correct person using two identifiers.   I discussed the limitations of evaluation and management by telemedicine and the availability of in person appointments. The patient expressed understanding and agreed to proceed.     Acute Office Visit  Subjective:    Patient ID: Bryan Hatfield, male    DOB: 09/13/1926, 85 y.o.   MRN: [id number]  Chief Complaint  Patient presents with  . Cough    x 2 days. ? bronchitis coughing up "green stuff"    HPI Patient is in today for cough x2 days.  He is coughing up green sputum and is worried he may have bronchitis.  Does feel a little bit short of breath with it.  He says he does have seasonal allergies but has been taking his allergy pill which is Zyrtec regularly.  He denies any fevers chills or sweats.  He denies any GI symptoms such as loose stools or nausea.  No known sick contacts.  No loss of smell or taste.  Last time he had a COPD exacerbation with cough was in July.  He has been using his albuterol twice a day.  He is not need any refills.  Past Medical History:  Diagnosis Date  . Acute respiratory failure (HCC) 09/25/11   hypoxic  . Anxiety   . Arthritis   . Bronchitis    "seems like 2x/year"  . CAD (coronary artery disease)    a. PTCA 2001. b. f/u cath 05/2012 heavy calcification, no PCI required. c. f/u cath for recurrent sx 2017: stable mod CAD unchanged from prior, mild progression of small vessel disease, elevated LVEDP.  . Colon polyps   . COPD (chronic obstructive pulmonary disease) (HCC)   . Diabetes mellitus without complication (HCC)   . GERD (gastroesophageal reflux disease)   . High cholesterol   . Hyperglycemia   . Hypertension   . Hypothyroidism   . Peripheral vascular disease (HCC)    patient denies knowledge of, but listed in his chart  . Pleural  effusion    Parapneumonic ?r/t PNA s/p thoracentesis 2008  . Pneumonia    hx  . Sleep apnea    cpap     Past Surgical History:  Procedure Laterality Date  . CARDIAC CATHETERIZATION N/A 11/25/2015   Procedure: Left Heart Cath and Coronary Angiography;  Surgeon: David W Harding, MD;  [facility];  Service: Cardiovascular;  Laterality: N/A;  . CATARACT EXTRACTION W/ INTRAOCULAR LENS  IMPLANT, BILATERAL  ~ 2006  . CHOLECYSTECTOMY N/A 10/11/2012   Procedure: LAPAROSCOPIC CHOLECYSTECTOMY WITH INTRAOPERATIVE CHOLANGIOGRAM;  Surgeon: Haywood M Ingram, MD;  Location: [facility];  Service: General;  Laterality: N/A;  . COLONOSCOPY    . CORONARY ANGIOPLASTY  01/2000  . HEMORRHOID SURGERY  1960's  . KNEE ARTHROSCOPY Left 04/19/2017   Procedure: ARTHROSCOPY KNEE, SUBCHONDROPLASTY;  Surgeon: Graves, John, MD;  Location: [facility];  Service: Orthopedics;  Laterality: Left;  . KNEE ARTHROSCOPY WITH MEDIAL MENISECTOMY Left 04/19/2017   Procedure: KNEE ARTHROSCOPY WITH MEDIAL AND LATERAL MENISECTOMY;  Surgeon: Graves, John, MD;  Location: [facility];  Service: Orthopedics;  Laterality: Left;  . LAPAROSCOPIC RIGHT COLECTOMY N/A 08/31/2017   Procedure: LAPAROSCOPIC ASSISTED ASCENDING COLECTOMY;  Surgeon: Ingram, Haywood, MD;  Location: [facility];  Service: General;  Laterality: N/A;    Family History    Problem Relation Age of Onset  . Stroke Mother   . Arthritis Mother   . Asthma Mother   . Emphysema Mother   . Stroke Father   . Arthritis Sister   . Stroke Sister   . Heart attack Neg Hx     Social History   Socioeconomic History  . Marital status: Married    Spouse name: Not on file  . Number of children: Not on file  . Years of education: Not on file  . Highest education level: Not on file  Occupational History  . 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 Financial resource strain: Not on file  . Food insecurity    Worry: Not on file    Inability: Not on  file  . Transportation needs    Medical: Not on file    Non-medical: Not on file  Tobacco Use  . Smoking status: Former Smoker    Packs/day: 1.00    Years: 10.00    Pack years: 10.00    Types: Cigarettes    Quit date: 06/01/1982    Years since quitting: 36.8  . Smokeless tobacco: Never Used  . Tobacco comment: "quit smoking ~ 25 years ago; smoked ~ 1 pk/wk"  Substance and Sexual Activity  . Alcohol use: No  . Drug use: No  . Sexual activity: Never  Lifestyle  . Physical activity    Days per week: Not on file    Minutes per session: Not on file  . Stress: Not on file  Relationships  . Social Herbalist on phone: Not on file    Gets together: Not on file    Attends religious service: Not on file    Active member of club or organization: Not on file    Attends meetings of clubs or organizations: Not on file    Relationship status: Not on file  . Intimate partner violence    Fear of current or ex partner: Not on file    Emotionally abused: Not on file    Physically abused: Not on file    Forced sexual activity: Not on file  Other Topics Concern  . Not on file  Social History Narrative  . Not on file    Outpatient Medications Prior to Visit  Medication Sig Dispense Refill  . albuterol (PROVENTIL HFA;VENTOLIN HFA) 108 (90 Base) MCG/ACT inhaler Inhale 2 puffs every 6 (six) hours as needed into the lungs for wheezing or shortness of breath.    . AMBULATORY NON FORMULARY MEDICATION Medication Name: 4 prong cane. Dx: gait instability 1 vial 0  . Ascorbic Acid (VITAMIN C PO) Take 1 tablet by mouth daily.    Marland Kitchen aspirin EC 81 MG tablet Take 81 mg by mouth daily.      Marland Kitchen atorvastatin (LIPITOR) 40 MG tablet TAKE 1 TABLET BY MOUTH AT  BEDTIME 90 tablet 2  . Blood Glucose Monitoring Suppl (ONE TOUCH ULTRA 2) w/Device KIT Onetouch ultra kit. DX: E11.9 1 each 0  . cetirizine (ZYRTEC) 10 MG tablet Take 10 mg by mouth daily.    Marland Kitchen escitalopram (LEXAPRO) 10 MG tablet Take 1 tablet  (10 mg total) by mouth daily. 90 tablet 1  . fluticasone (FLONASE) 50 MCG/ACT nasal spray One spray in each nostril twice a day, use left hand for right nostril, and right hand for left nostril. 48 g 3  . folic acid (FOLVITE) 1 MG tablet Take  1 mg by mouth daily.    . glucose blood (ONE TOUCH ULTRA TEST) test strip For testing blood sugars twice daily.E11.9 200 each 12  . ipratropium-albuterol (DUONEB) 0.5-2.5 (3) MG/3ML SOLN USE 1 VIAL VIA NEBULIZER EVERY 2 HOURS AS NEEDED FOR WHEEZING, SHORTNESS OF BREATH. 4050 mL 3  . levothyroxine (SYNTHROID) 125 MCG tablet TAKE 1 TABLET BY MOUTH  DAILY BEFORE BREAKFAST 90 tablet 3  . metFORMIN (GLUCOPHAGE-XR) 500 MG 24 hr tablet TAKE 1 TABLET(500 MG) BY MOUTH DAILY WITH BREAKFAST 90 tablet 3  . Multiple Vitamins-Minerals (ICAPS AREDS 2) CAPS Take 1 capsule 2 (two) times daily by mouth.    . nitroGLYCERIN (NITROSTAT) 0.4 MG SL tablet DISSOLVE 1 TABLET UNDER THE TONGUE EVERY 5 MINUTES AS  NEEDED CHEST PAIN. NOT TO  EXCEED 3 TABLETS IN 15  MINUTES 25 tablet 4  . oxybutynin (DITROPAN-XL) 10 MG 24 hr tablet Take 10 mg by mouth at bedtime.    . potassium chloride (K-DUR) 10 MEQ tablet TAKE 1 TABLET(10 MEQ) BY MOUTH TWICE DAILY 180 tablet 3  . Tiotropium Bromide-Olodaterol (STIOLTO RESPIMAT) 2.5-2.5 MCG/ACT AERS Inhale 2 puffs into the lungs daily. 4 g 5  . torsemide (DEMADEX) 20 MG tablet Take 1 tablet (20 mg total) by mouth 2 (two) times daily. 180 tablet 1  . zolpidem (AMBIEN) 10 MG tablet TAKE 1 TABLET BY MOUTH AT BEDTIME AS NEEDED FOR SLEEP 30 tablet 3   No facility-administered medications prior to visit.     No Known Allergies  ROS     Objective:    Physical Exam  There were no vitals taken for this visit. Wt Readings from Last 3 Encounters:  01/23/19 261 lb (118.4 kg)  01/17/19 278 lb (126.1 kg)  12/23/18 284 lb (128.8 kg)    Health Maintenance Due  Topic Date Due  . URINE MICROALBUMIN  10/27/2018  . FOOT EXAM  03/01/2019    There are no  preventive care reminders to display for this patient.   Lab Results  Component Value Date   TSH 1.94 08/04/2018   Lab Results  Component Value Date   WBC 7.0 12/23/2018   HGB 13.2 12/23/2018   HCT 39.8 12/23/2018   MCV 95.0 12/23/2018   PLT 198 12/23/2018   Lab Results  Component Value Date   NA 143 01/25/2019   K 4.1 01/25/2019   CO2 32 01/25/2019   GLUCOSE 121 (H) 01/25/2019   BUN 22 01/25/2019   CREATININE 0.98 01/25/2019   BILITOT 0.5 12/23/2018   ALKPHOS 82 08/25/2017   AST 39 (H) 12/23/2018   ALT 48 (H) 12/23/2018   PROT 6.2 12/23/2018   ALBUMIN 3.4 (L) 08/25/2017   CALCIUM 8.6 01/25/2019   ANIONGAP 8 09/04/2017   GFR 89.45 05/29/2015   Lab Results  Component Value Date   CHOL 150 01/25/2019   Lab Results  Component Value Date   HDL 51 01/25/2019   Lab Results  Component Value Date   LDLCALC 78 01/25/2019   Lab Results  Component Value Date   TRIG 128 01/25/2019   Lab Results  Component Value Date   CHOLHDL 2.9 01/25/2019   Lab Results  Component Value Date   HGBA1C 6.4 (A) 01/23/2019       Assessment & Plan:   Problem List Items Addressed This Visit      Respiratory   COPD (chronic obstructive pulmonary disease) (HCC) - Primary   Relevant Medications   azithromycin (ZITHROMAX) 250 MG tablet     predniSONE (DELTASONE) 20 MG tablet    Other Visit Diagnoses    COPD exacerbation (HCC)       Relevant Medications   azithromycin (ZITHROMAX) 250 MG tablet   predniSONE (DELTASONE) 20 MG tablet     COPD exacerbation-discussed treatment options.  He had similar symptoms back in July.  We discussed warning signs and symptoms for possible Covid and to call us back if he feels like any point he is getting worse.  Continue to use albuterol as needed.  Meds ordered this encounter  Medications  . azithromycin (ZITHROMAX) 250 MG tablet    Sig: 2 Ttabs PO on Day 1, then one a day x 4 days.    Dispense:  6 tablet    Refill:  0  . predniSONE  (DELTASONE) 20 MG tablet    Sig: Take 2 tablets (40 mg total) by mouth daily with breakfast.    Dispense:  10 tablet    Refill:  0    I discussed the assessment and treatment plan with the patient. The patient was provided an opportunity to ask questions and all were answered. The patient agreed with the plan and demonstrated an understanding of the instructions.   The patient was advised to call back or seek an in-person evaluation if the symptoms worsen or if the condition fails to improve as anticipated.  Catherine Metheney, MD 

## 2019-03-16 ENCOUNTER — Other Ambulatory Visit: Payer: Self-pay | Admitting: Family Medicine

## 2019-03-16 DIAGNOSIS — E1169 Type 2 diabetes mellitus with other specified complication: Secondary | ICD-10-CM

## 2019-03-16 MED ORDER — METFORMIN HCL 500 MG PO TABS: 500.0000 mg | ORAL_TABLET | Freq: Two times a day (BID) | ORAL | 1 refills | Status: DC

## 2019-03-16 NOTE — Progress Notes (Signed)
Change metformin ER to BID short release bc of medication recall.

## 2019-04-04 ENCOUNTER — Ambulatory Visit (INDEPENDENT_AMBULATORY_CARE_PROVIDER_SITE_OTHER): Payer: Medicare Other | Admitting: *Deleted

## 2019-04-04 ENCOUNTER — Other Ambulatory Visit: Payer: Self-pay

## 2019-04-04 ENCOUNTER — Ambulatory Visit (INDEPENDENT_AMBULATORY_CARE_PROVIDER_SITE_OTHER): Payer: Medicare Other | Admitting: Family Medicine

## 2019-04-04 VITALS — BP 128/54 | HR 77 | Temp 98.2°F | Ht 71.0 in | Wt 284.0 lb

## 2019-04-04 DIAGNOSIS — I10 Essential (primary) hypertension: Secondary | ICD-10-CM | POA: Diagnosis not present

## 2019-04-04 DIAGNOSIS — Z0000 Encounter for general adult medical examination without abnormal findings: Secondary | ICD-10-CM | POA: Diagnosis not present

## 2019-04-04 DIAGNOSIS — R635 Abnormal weight gain: Secondary | ICD-10-CM | POA: Diagnosis not present

## 2019-04-04 DIAGNOSIS — E1169 Type 2 diabetes mellitus with other specified complication: Secondary | ICD-10-CM | POA: Diagnosis not present

## 2019-04-04 DIAGNOSIS — E669 Obesity, unspecified: Secondary | ICD-10-CM

## 2019-04-04 DIAGNOSIS — R7989 Other specified abnormal findings of blood chemistry: Secondary | ICD-10-CM | POA: Diagnosis not present

## 2019-04-04 NOTE — Patient Instructions (Signed)
Increase your torsemide to twice a day for the next 2 days and weigh yourself daily at home.  Let me know what your weight is after the 2 days.

## 2019-04-04 NOTE — Progress Notes (Signed)
Acute Office Visit  Subjective:    Patient ID: Bryan Hatfield, male    DOB: [birth year], 85 y.o.   MRN: [id number]  No chief complaint on file.   HPI Patient is in today for abnormal weight gain.  He actually came in today for his Medicare wellness exam and the nurse had noted that he had had a significant weight gain in a very short period of time.  He admits he has been eating a lot of bread.  He says he does take his medications daily including his diuretic and his thyroid pill he does not feel like any of those have changed the only thing that looks different more recently is his metformin.  He denies any chest pain or lower extremity swelling.  He denies any increased work of breathing or shortness of breath.  He was turned 61 pounds in August and is up to 284 pounds.  That is an increase of 23 pounds.  Assuming the scale is correct because earlier in August his weight was 278.  Past Medical History:  Diagnosis Date  . Acute respiratory failure ([place]) 09/25/11   hypoxic  . Anxiety   . Arthritis   . Bronchitis    "seems like 2x/year"  . CAD (coronary artery disease)    a. PTCA 2001. b. f/u cath 05/2012 heavy calcification, no PCI required. c. f/u cath for recurrent sx 2017: stable mod CAD unchanged from prior, mild progression of small vessel disease, elevated LVEDP.  . Colon polyps   . COPD (chronic obstructive pulmonary disease) ([place])   . Diabetes mellitus without complication ([place])   . GERD (gastroesophageal reflux disease)   . High cholesterol   . Hyperglycemia   . Hypertension   . Hypothyroidism   . Peripheral vascular disease ([place])    patient denies knowledge of, but listed in his chart  . Pleural effusion    Parapneumonic ?r/t PNA s/p thoracentesis 2008  . Pneumonia    hx  . Sleep apnea    cpap     Past Surgical History:  Procedure Laterality Date  . CARDIAC CATHETERIZATION N/A 11/25/2015   Procedure: Left Heart Cath and Coronary Angiography;  Surgeon: Leonie Man, MD;  Location: [facility];  Service: Cardiovascular;  Laterality: N/A;  . CATARACT EXTRACTION W/ INTRAOCULAR LENS  IMPLANT, BILATERAL  ~ 2006  . CHOLECYSTECTOMY N/A 10/11/2012   Procedure: LAPAROSCOPIC CHOLECYSTECTOMY WITH INTRAOPERATIVE CHOLANGIOGRAM;  Surgeon: Adin Hector, MD;  Location: [facility];  Service: General;  Laterality: N/A;  . COLONOSCOPY    . CORONARY ANGIOPLASTY  01/2000  . HEMORRHOID SURGERY  1960's  . KNEE ARTHROSCOPY Left 04/19/2017   Procedure: ARTHROSCOPY KNEE, SUBCHONDROPLASTY;  Surgeon: Dorna Leitz, MD;  Location: [facility];  Service: Orthopedics;  Laterality: Left;  . KNEE ARTHROSCOPY WITH MEDIAL MENISECTOMY Left 04/19/2017   Procedure: KNEE ARTHROSCOPY WITH MEDIAL AND LATERAL MENISECTOMY;  Surgeon: Dorna Leitz, MD;  Location: [facility];  Service: Orthopedics;  Laterality: Left;  . LAPAROSCOPIC RIGHT COLECTOMY N/A 08/31/2017   Procedure: LAPAROSCOPIC ASSISTED ASCENDING COLECTOMY;  Surgeon: Fanny Skates, MD;  Location: [facility];  Service: General;  Laterality: N/A;    Family History  Problem Relation Age of Onset  . Stroke Mother   . Arthritis Mother   . Asthma Mother   . Emphysema Mother   . Stroke Father   . Arthritis Sister   . Stroke Sister   . Heart attack Neg Hx     Social History  Socioeconomic History  . Marital status: Married    Spouse name: Ivin Booty  . Number of children: 1  . Years of education: 25  . Highest education level: 9th grade  Occupational History  . 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 Financial resource strain: Not hard at all  . Food insecurity    Worry: Never true    Inability: Never true  . Transportation needs    Medical: No    Non-medical: No  Tobacco Use  . Smoking status: Former Smoker    Packs/day: 1.00    Years: 10.00    Pack years: 10.00    Types: Cigarettes    Quit date: 06/01/1982    Years since quitting: 36.8  . Smokeless tobacco: Never Used  . Tobacco  comment: "quit smoking ~ 25 years ago; smoked ~ 1 pk/wk"  Substance and Sexual Activity  . Alcohol use: Yes    Alcohol/week: 1.0 standard drinks    Types: 1 Cans of beer per week    Comment: occasionally  . Drug use: No  . Sexual activity: Never  Lifestyle  . Physical activity    Days per week: 0 days    Minutes per session: 0 min  . Stress: Not at all  Relationships  . Social Herbalist on phone: Once a week    Gets together: Once a week    Attends religious service: More than 4 times per year    Active member of club or organization: Yes    Attends meetings of clubs or organizations: 1 to 4 times per year    Relationship status: Married  . Intimate partner violence    Fear of current or ex partner: No    Emotionally abused: No    Physically abused: No    Forced sexual activity: No  Other Topics Concern  . Not on file  Social History Narrative   Not exercising due to [place] was a member at the [facility]. Just had eye exam all clear.    Outpatient Medications Prior to Visit  Medication Sig Dispense Refill  . albuterol (PROVENTIL HFA;VENTOLIN HFA) 108 (90 Base) MCG/ACT inhaler Inhale 2 puffs every 6 (six) hours as needed into the lungs for wheezing or shortness of breath.    . AMBULATORY NON FORMULARY MEDICATION Medication Name: 4 prong cane. Dx: gait instability (Patient not taking: Reported on 04/04/2019) 1 vial 0  . Ascorbic Acid (VITAMIN C PO) Take 1 tablet by mouth daily.    Marland Kitchen aspirin EC 81 MG tablet Take 81 mg by mouth daily.      Marland Kitchen atorvastatin (LIPITOR) 40 MG tablet TAKE 1 TABLET BY MOUTH AT  BEDTIME 90 tablet 2  . Blood Glucose Monitoring Suppl (ONE TOUCH ULTRA 2) w/Device KIT Onetouch ultra kit. DX: E11.9 1 each 0  . cetirizine (ZYRTEC) 10 MG tablet Take 10 mg by mouth daily.    Marland Kitchen escitalopram (LEXAPRO) 10 MG tablet Take 1 tablet (10 mg total) by mouth daily. 90 tablet 1  . fluticasone (FLONASE) 50 MCG/ACT nasal spray One spray in each nostril twice a day, use  left hand for right nostril, and right hand for left nostril. 48 g 3  . folic acid (FOLVITE) 1 MG tablet Take 1 mg by mouth daily.    Marland Kitchen glucose blood (ONE TOUCH ULTRA TEST) test strip For testing blood sugars twice daily.E11.9 200 each 12  . ipratropium-albuterol (DUONEB) 0.5-2.5 (3)  MG/3ML SOLN USE 1 VIAL VIA NEBULIZER EVERY 2 HOURS AS NEEDED FOR WHEEZING, SHORTNESS OF BREATH. (Patient not taking: Reported on 04/04/2019) 4050 mL 3  . levothyroxine (SYNTHROID) 125 MCG tablet TAKE 1 TABLET BY MOUTH  DAILY BEFORE BREAKFAST 90 tablet 3  . metFORMIN (GLUCOPHAGE) 500 MG tablet Take 1 tablet (500 mg total) by mouth 2 (two) times daily with a meal. 180 tablet 1  . metFORMIN (GLUCOPHAGE-XR) 500 MG 24 hr tablet TAKE 1 TABLET(500 MG) BY MOUTH DAILY WITH BREAKFAST 90 tablet 3  . Multiple Vitamins-Minerals (ICAPS AREDS 2) CAPS Take 1 capsule 2 (two) times daily by mouth.    . nitroGLYCERIN (NITROSTAT) 0.4 MG SL tablet DISSOLVE 1 TABLET UNDER THE TONGUE EVERY 5 MINUTES AS  NEEDED CHEST PAIN. NOT TO  EXCEED 3 TABLETS IN 15  MINUTES 25 tablet 4  . oxybutynin (DITROPAN-XL) 10 MG 24 hr tablet Take 10 mg by mouth at bedtime.    . potassium chloride (K-DUR) 10 MEQ tablet TAKE 1 TABLET(10 MEQ) BY MOUTH TWICE DAILY 180 tablet 3  . Tiotropium Bromide-Olodaterol (STIOLTO RESPIMAT) 2.5-2.5 MCG/ACT AERS Inhale 2 puffs into the lungs daily. (Patient not taking: Reported on 04/04/2019) 4 g 5  . torsemide (DEMADEX) 20 MG tablet Take 1 tablet (20 mg total) by mouth 2 (two) times daily. 180 tablet 1  . zolpidem (AMBIEN) 10 MG tablet TAKE 1 TABLET BY MOUTH AT BEDTIME AS NEEDED FOR SLEEP 30 tablet 3   No facility-administered medications prior to visit.     No Known Allergies  ROS     Objective:    Physical Exam  Constitutional: He is oriented to person, place, and time. He appears well-developed and well-nourished.  HENT:  Head: Normocephalic and atraumatic.  Cardiovascular: Normal rate, regular rhythm and normal heart  sounds.  Pulmonary/Chest: Effort normal and breath sounds normal.  Musculoskeletal:        General: No edema.     Comments: No lower extremity edema.  Neurological: He is alert and oriented to person, place, and time.  Skin: Skin is warm and dry.  Psychiatric: He has a normal mood and affect. His behavior is normal.    There were no vitals taken for this visit. Wt Readings from Last 3 Encounters:  04/04/19 284 lb (128.8 kg)  01/23/19 261 lb (118.4 kg)  01/17/19 278 lb (126.1 kg)    Health Maintenance Due  Topic Date Due  . URINE MICROALBUMIN  10/27/2018  . FOOT EXAM  03/01/2019    There are no preventive care reminders to display for this patient.   Lab Results  Component Value Date   TSH 2.33 04/04/2019   Lab Results  Component Value Date   WBC 5.8 04/04/2019   HGB 13.4 04/04/2019   HCT 40.2 04/04/2019   MCV 94.4 04/04/2019   PLT 195 04/04/2019   Lab Results  Component Value Date   NA 142 04/04/2019   K 4.0 04/04/2019   CO2 30 04/04/2019   GLUCOSE 119 (H) 04/04/2019   BUN 19 04/04/2019   CREATININE 1.01 04/04/2019   BILITOT 0.4 04/04/2019   ALKPHOS 82 08/25/2017   AST 27 04/04/2019   ALT 32 04/04/2019   PROT 6.3 04/04/2019   ALBUMIN 3.4 (L) 08/25/2017   CALCIUM 9.0 04/04/2019   ANIONGAP 8 09/04/2017   GFR 89.45 05/29/2015   Lab Results  Component Value Date   CHOL 150 01/25/2019   Lab Results  Component Value Date   HDL 51 01/25/2019  Lab Results  Component Value Date   LDLCALC 78 01/25/2019   Lab Results  Component Value Date   TRIG 128 01/25/2019   Lab Results  Component Value Date   CHOLHDL 2.9 01/25/2019   Lab Results  Component Value Date   HGBA1C 6.4 (A) 01/23/2019       Assessment & Plan:   Problem List Items Addressed This Visit      Cardiovascular and Mediastinum   Essential hypertension, benign (Chronic)    Well controlled. Continue current regimen. Follow up in  6 mo      Relevant Orders   CBC (Completed)    COMPLETE METABOLIC PANEL WITH GFR (Completed)   TSH (Completed)   B Nat Peptide     Endocrine   Diabetes mellitus type 2 in obese (HCC)   Relevant Orders   CBC (Completed)   COMPLETE METABOLIC PANEL WITH GFR (Completed)   TSH (Completed)   B Nat Peptide     Other   Severe obesity (BMI 35.0-39.9) with comorbidity [facility])    He admits he has been eating a lot of bread lately.  He says his wife has not been feeling well and has not been cooking and so has been eating a lot of bread and sandwiches.  Just really encouraged him to cut back on his portions of carbs and really try to increase his vegetable intake.       Other Visit Diagnoses    Abnormal weight gain    -  Primary   Relevant Orders   CBC (Completed)   COMPLETE METABOLIC PANEL WITH GFR (Completed)   TSH (Completed)   B Nat Peptide      Abnormal weight gain - Unclear etiology.  I am wondering if his weight could have been accurate the last time he was here especially based on the weight prior to that either way he still has gained some weight and I think it could be dietary but I just want to make sure that there is no sign of volume overload or heart failure.  I do not see any extremity swelling on exam and is not complaining of any chest pain or increased shortness of breath.  No orders of the defined types were placed in this encounter.    Beatrice Lecher, MD

## 2019-04-04 NOTE — Progress Notes (Signed)
Subjective:   Bryan Hatfield is a 85 y.o. male who presents for Medicare Annual/Subsequent preventive examination.  Review of Systems:  No ROS.  Medicare Wellness Virtual Visit.  Visual/audio telehealth visit, UTA vital signs.   See social history for additional risk factors.    Cardiac Risk Factors include: advanced age (>68mn, >>61women);diabetes mellitus;sedentary lifestyle;hypertension;male gender Sleep patterns: Getting 6-7 hours of sleep a night. Wakes up during the night 1 time to void. Wakes up and feels rested and ready for the day.   Home Safety/Smoke Alarms: Feels safe in home. Smoke alarms in place.  Living environment; Lives with wife in apartment on third floor and has elevator he uses.  Seat Belt Safety/Bike Helmet: Wears seat belt.    Male:   CCS-  UTD    PSA- No results found for: PSA       Objective:    Vitals: BP (!) 128/54   Pulse 77   Temp 98.2 F (36.8 C) (Oral)   Ht '5\' 11"'  (1.803 m)   Wt 284 lb (128.8 kg)   SpO2 96%   BMI 39.61 kg/m   Body mass index is 39.61 kg/m.  Advanced Directives 04/04/2019 08/31/2017 08/31/2017 08/25/2017 05/06/2017 04/19/2017 07/17/2016  Does Patient Have a Medical Advance Directive? Yes Yes - Yes Yes Yes Yes  Type of Advance Directive [place] will [place] will - [place] will Healthcare Power of [place] will -  Does patient want to make changes to medical advance directive? No - Patient declined No - Patient declined No - Patient declined No - Patient declined - No - Patient declined -  Copy of [place]? No - copy requested No - copy requested No - copy requested No - copy requested No - copy requested No - copy requested -  Pre-existing out of facility DNR order (yellow form or pink MOST form) - - - - - - -    Tobacco Social History   Tobacco Use  Smoking Status Former Smoker   . Packs/day: 1.00  . Years: 10.00  . Pack years: 10.00  . Types: Cigarettes  . Quit date: 06/01/1982  . Years since quitting: 36.8  Smokeless Tobacco Never Used  Tobacco Comment   "quit smoking ~ 25 years ago; smoked ~ 1 pk/wk"     Counseling given: Not Answered Comment: "quit smoking ~ 25 years ago; smoked ~ 1 pk/wk"   Clinical Intake:  Pre-visit preparation completed: Yes  Pain : No/denies pain     Nutritional Risks: None Diabetes: Yes CBG done?: No Did pt. bring in CBG monitor from home?: No  How often do you need to have someone help you when you read instructions, pamphlets, or other written materials from your doctor or pharmacy?: 1 - Never  Interpreter Needed?: No  Information entered by :: KOrlie Dakin LPN  Past Medical History:  Diagnosis Date  . Acute respiratory failure ([place] 09/25/11   hypoxic  . Anxiety   . Arthritis   . Bronchitis    "seems like 2x/year"  . CAD (coronary artery disease)    a. PTCA 2001. b. f/u cath 05/2012 heavy calcification, no PCI required. c. f/u cath for recurrent sx 2017: stable mod CAD unchanged from prior, mild progression of small vessel disease, elevated LVEDP.  . Colon polyps   . COPD (chronic obstructive pulmonary disease) ([place]   . Diabetes mellitus without complication ([facility]   .  GERD (gastroesophageal reflux disease)   . High cholesterol   . Hyperglycemia   . Hypertension   . Hypothyroidism   . Peripheral vascular disease ([place])    patient denies knowledge of, but listed in his chart  . Pleural effusion    Parapneumonic ?r/t PNA s/p thoracentesis 2008  . Pneumonia    hx  . Sleep apnea    cpap    Past Surgical History:  Procedure Laterality Date  . CARDIAC CATHETERIZATION N/A 11/25/2015   Procedure: Left Heart Cath and Coronary Angiography;  Surgeon: Leonie Man, MD;  Location: [facility];  Service: Cardiovascular;  Laterality: N/A;  . CATARACT EXTRACTION W/ INTRAOCULAR LENS  IMPLANT, BILATERAL  ~ 2006   . CHOLECYSTECTOMY N/A 10/11/2012   Procedure: LAPAROSCOPIC CHOLECYSTECTOMY WITH INTRAOPERATIVE CHOLANGIOGRAM;  Surgeon: Adin Hector, MD;  Location: ;  Service: General;  Laterality: N/A;  . COLONOSCOPY    . CORONARY ANGIOPLASTY  01/2000  . HEMORRHOID SURGERY  1960's  . KNEE ARTHROSCOPY Left 04/19/2017   Procedure: ARTHROSCOPY KNEE, SUBCHONDROPLASTY;  Surgeon: Dorna Leitz, MD;  Location: [facility];  Service: Orthopedics;  Laterality: Left;  . KNEE ARTHROSCOPY WITH MEDIAL MENISECTOMY Left 04/19/2017   Procedure: KNEE ARTHROSCOPY WITH MEDIAL AND LATERAL MENISECTOMY;  Surgeon: Dorna Leitz, MD;  Location: [facility];  Service: Orthopedics;  Laterality: Left;  . LAPAROSCOPIC RIGHT COLECTOMY N/A 08/31/2017   Procedure: LAPAROSCOPIC ASSISTED ASCENDING COLECTOMY;  Surgeon: Fanny Skates, MD;  Location: [facility];  Service: General;  Laterality: N/A;   Family History  Problem Relation Age of Onset  . Stroke Mother   . Arthritis Mother   . Asthma Mother   . Emphysema Mother   . Stroke Father   . Arthritis Sister   . Stroke Sister   . Heart attack Neg Hx    Social History   Socioeconomic History  . Marital status: Married    Spouse name: Ivin Booty  . Number of children: 1  . Years of education: 62  . Highest education level: 9th grade  Occupational History  . 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 Financial resource strain: Not hard at all  . Food insecurity    Worry: Never true    Inability: Never true  . Transportation needs    Medical: No    Non-medical: No  Tobacco Use  . Smoking status: Former Smoker    Packs/day: 1.00    Years: 10.00    Pack years: 10.00    Types: Cigarettes    Quit date: 06/01/1982    Years since quitting: 36.8  . Smokeless tobacco: Never Used  . Tobacco comment: "quit smoking ~ 25 years ago; smoked ~ 1 pk/wk"  Substance and Sexual Activity  . Alcohol use: Yes    Alcohol/week: 1.0 standard drinks    Types: 1 Cans of  beer per week    Comment: occasionally  . Drug use: No  . Sexual activity: Never  Lifestyle  . Physical activity    Days per week: 0 days    Minutes per session: 0 min  . Stress: Not at all  Relationships  . Social Herbalist on phone: Once a week    Gets together: Once a week    Attends religious service: More than 4 times per year    Active member of club or organization: Yes    Attends meetings of clubs or organizations: 1 to 4  times per year    Relationship status: Married  Other Topics Concern  . Not on file  Social History Narrative   Not exercising due to [place] was a member at the [facility]. Just had eye exam all clear.    Outpatient Encounter Medications as of 04/04/2019  Medication Sig  . albuterol (PROVENTIL HFA;VENTOLIN HFA) 108 (90 Base) MCG/ACT inhaler Inhale 2 puffs every 6 (six) hours as needed into the lungs for wheezing or shortness of breath.  . Ascorbic Acid (VITAMIN C PO) Take 1 tablet by mouth daily.  Marland Kitchen aspirin EC 81 MG tablet Take 81 mg by mouth daily.    Marland Kitchen atorvastatin (LIPITOR) 40 MG tablet TAKE 1 TABLET BY MOUTH AT  BEDTIME  . Blood Glucose Monitoring Suppl (ONE TOUCH ULTRA 2) w/Device KIT Onetouch ultra kit. DX: E11.9  . cetirizine (ZYRTEC) 10 MG tablet Take 10 mg by mouth daily.  Marland Kitchen escitalopram (LEXAPRO) 10 MG tablet Take 1 tablet (10 mg total) by mouth daily.  . fluticasone (FLONASE) 50 MCG/ACT nasal spray One spray in each nostril twice a day, use left hand for right nostril, and right hand for left nostril.  . folic acid (FOLVITE) 1 MG tablet Take 1 mg by mouth daily.  Marland Kitchen glucose blood (ONE TOUCH ULTRA TEST) test strip For testing blood sugars twice daily.E11.9  . levothyroxine (SYNTHROID) 125 MCG tablet TAKE 1 TABLET BY MOUTH  DAILY BEFORE BREAKFAST  . metFORMIN (GLUCOPHAGE) 500 MG tablet Take 1 tablet (500 mg total) by mouth 2 (two) times daily with a meal.  . metFORMIN (GLUCOPHAGE-XR) 500 MG 24 hr tablet TAKE 1 TABLET(500 MG) BY MOUTH DAILY  WITH BREAKFAST  . Multiple Vitamins-Minerals (ICAPS AREDS 2) CAPS Take 1 capsule 2 (two) times daily by mouth.  . nitroGLYCERIN (NITROSTAT) 0.4 MG SL tablet DISSOLVE 1 TABLET UNDER THE TONGUE EVERY 5 MINUTES AS  NEEDED CHEST PAIN. NOT TO  EXCEED 3 TABLETS IN 15  MINUTES  . oxybutynin (DITROPAN-XL) 10 MG 24 hr tablet Take 10 mg by mouth at bedtime.  . potassium chloride (K-DUR) 10 MEQ tablet TAKE 1 TABLET(10 MEQ) BY MOUTH TWICE DAILY  . torsemide (DEMADEX) 20 MG tablet Take 1 tablet (20 mg total) by mouth 2 (two) times daily.  Marland Kitchen zolpidem (AMBIEN) 10 MG tablet TAKE 1 TABLET BY MOUTH AT BEDTIME AS NEEDED FOR SLEEP  . AMBULATORY NON FORMULARY MEDICATION Medication Name: 4 prong cane. Dx: gait instability (Patient not taking: Reported on 04/04/2019)  . ipratropium-albuterol (DUONEB) 0.5-2.5 (3) MG/3ML SOLN USE 1 VIAL VIA NEBULIZER EVERY 2 HOURS AS NEEDED FOR WHEEZING, SHORTNESS OF BREATH. (Patient not taking: Reported on 04/04/2019)  . Tiotropium Bromide-Olodaterol (STIOLTO RESPIMAT) 2.5-2.5 MCG/ACT AERS Inhale 2 puffs into the lungs daily. (Patient not taking: Reported on 04/04/2019)  . [DISCONTINUED] predniSONE (DELTASONE) 20 MG tablet Take 2 tablets (40 mg total) by mouth daily with breakfast.   No facility-administered encounter medications on file as of 04/04/2019.     Activities of Daily Living In your present state of health, do you have any difficulty performing the following activities: 04/04/2019  Hearing? Y  Comment has noticed some hearing loss and has appoitment  Vision? N  Difficulty concentrating or making decisions? N  Walking or climbing stairs? Y  Comment strain on him gives out of breath  Dressing or bathing? N  Doing errands, shopping? N  Preparing Food and eating ? N  Using the Toilet? N  In the past six months, have you accidently leaked urine?  N  Do you have problems with loss of bowel control? N  Managing your Medications? N  Managing your Finances? N  Housekeeping or  managing your Housekeeping? N  Some recent data might be hidden    Patient Care Team: [name], MD as PCP - General (Family Medicine) Belva Crome, MD as PCP - Cardiology (Cardiology)   Assessment:   This is a routine wellness examination for [place].Physical assessment deferred to PCP.   Exercise Activities and Dietary recommendations Current Exercise Habits: The patient does not participate in regular exercise at present, Exercise limited by: orthopedic condition(s);respiratory conditions(s) Diet Eats a fairly healthy diet of fruits and vegetables. States he eats a lot of sandwiches, discussed the need to cut down on carb intake to help with weight and sugar levels.  Breakfast:eggs, cereal Lunch: sandwich Dinner: Meat and vegetable or potato    Patient has a 24 lb. Weight gain since August. Spoke with Dr. Madilyn Fireman and she is gonna see this patient today. 1+ pitting edema in lower extremities, SOB  Goals    . Exercise 3x per week (30 min per time)     Wants to start back exercising once [facility] [facility] back up.       Fall Risk Fall Risk  04/04/2019 06/22/2018 03/10/2017 04/28/2016 12/18/2013  Falls in the past year? 0 0 No No Yes  Number falls in past yr: 0 0 - - 1  Injury with Fall? 0 0 - - -  Risk for fall due to : Impaired balance/gait Impaired balance/gait;Impaired mobility - - Impaired balance/gait  Follow up Falls prevention discussed Falls prevention discussed - - -   Is the patient's home free of loose throw rugs in walkways, pet beds, electrical cords, etc?   yes      Grab bars in the bathroom? yes      Handrails on the stairs?   yes      Adequate lighting?   yes   Depression Screen PHQ 2/9 Scores 04/04/2019 11/24/2018 06/22/2018 02/28/2018  PHQ - 2 Score 0 0 0 0  PHQ- 9 Score - - - -    Cognitive Function     6CIT Screen 04/04/2019  What Year? 0 points  What month? 0 points  What time? 0 points  Count back from 20 0 points  Months in reverse 0 points   Repeat phrase 0 points  Total Score 0    Immunization History  Administered Date(s) Administered  . Fluad Quad(high Dose 65+) 01/23/2019  . Influenza Split 01/31/2012  . Influenza Whole 03/16/2008, 03/01/2013  . Influenza, High Dose Seasonal PF 03/09/2017  . Influenza, Seasonal, Injecte, Preservative Fre 02/28/2016  . Influenza,inj,Quad PF,6+ Mos 01/22/2015  . Influenza-Unspecified 01/27/2018  . Pneumococcal Conjugate-13 12/21/2013  . Pneumococcal Polysaccharide-23 03/15/2001  . Pneumococcal-Unspecified 01/31/2008  . Tdap 06/01/2005, 07/01/2011  . Zoster 06/01/2012    Screening Tests Health Maintenance  Topic Date Due  . URINE MICROALBUMIN  10/27/2018  . FOOT EXAM  03/01/2019  . OPHTHALMOLOGY EXAM  04/14/2019 (Originally 08/01/1936)  . HEMOGLOBIN A1C  07/26/2019  . TETANUS/TDAP  06/30/2021  . COLONOSCOPY  07/08/2022  . INFLUENZA VACCINE  Completed  . PNA vac Low Risk Adult  Completed        Plan:  Please schedule your next medicare wellness visit with me in 1 yr.  Mr. Scerbo , Thank you for taking time to come for your Medicare Wellness Visit. I appreciate your ongoing commitment to your health goals. Please  review the following plan we discussed and let me know if I can assist you in the future.  Continue doing brain stimulating activities (puzzles, reading, adult coloring books, staying active) to keep memory sharp.  Bring a copy of your living will and/or healthcare power of attorney to your next office visit.   These are the goals we discussed: Goals    . Exercise 3x per week (30 min per time)     Wants to start back exercising once [employer] [facility] back up.       This is a list of the screening recommended for you and due dates:  Health Maintenance  Topic Date Due  . Urine Protein Check  10/27/2018  . Complete foot exam   03/01/2019  . Eye exam for diabetics  04/14/2019*  . Hemoglobin A1C  07/26/2019  . Tetanus Vaccine  06/30/2021  . Colon Cancer Screening   07/08/2022  . Flu Shot  Completed  . Pneumonia vaccines  Completed  *Topic was postponed. The date shown is not the original due date.     I have personally reviewed and noted the following in the patient's chart:   . Medical and social history . Use of alcohol, tobacco or illicit drugs  . Current medications and supplements . Functional ability and status . Nutritional status . Physical activity . Advanced directives . List of other physicians . Hospitalizations, surgeries, and ER visits in previous 12 months . Vitals . Screenings to include cognitive, depression, and falls . Referrals and appointments  In addition, I have reviewed and discussed with patient certain preventive protocols, quality metrics, and best practice recommendations. A written personalized care plan for preventive services as well as general preventive health recommendations were provided to patient.     Joanne Chars, LPN  08/04/2479

## 2019-04-04 NOTE — Patient Instructions (Signed)
Please schedule your next medicare wellness visit with me in 1 yr. Bryan Hatfield , Thank you for taking time to come for your Medicare Wellness Visit. I appreciate your ongoing commitment to your health goals. Please review the following plan we discussed and let me know if I can assist you in the future.  Continue doing brain stimulating activities (puzzles, reading, adult coloring books, staying active) to keep memory sharp.  Bring a copy of your living will and/or healthcare power of attorney to your next office visit. These are the goals we discussed: Goals    . Exercise 3x per week (30 min per time)     Wants to start back exercising once [employer] [facility] back up.

## 2019-04-05 ENCOUNTER — Encounter: Payer: Self-pay | Admitting: Family Medicine

## 2019-04-05 LAB — CBC
HCT: 40.2 % (ref 38.5–50.0)
Hemoglobin: 13.4 g/dL (ref 13.2–17.1)
MCH: 31.5 pg (ref 27.0–33.0)
MCHC: 33.3 g/dL (ref 32.0–36.0)
MCV: 94.4 fL (ref 80.0–100.0)
MPV: 10.3 fL (ref 7.5–12.5)
Platelets: 195 10*3/uL (ref 140–400)
RBC: 4.26 10*6/uL (ref 4.20–5.80)
RDW: 13.1 % (ref 11.0–15.0)
WBC: 5.8 10*3/uL (ref 3.8–10.8)

## 2019-04-05 LAB — COMPLETE METABOLIC PANEL WITH GFR
AG Ratio: 1.6 (calc) (ref 1.0–2.5)
ALT: 32 U/L (ref 9–46)
AST: 27 U/L (ref 10–35)
Albumin: 3.9 g/dL (ref 3.6–5.1)
Alkaline phosphatase (APISO): 95 U/L (ref 35–144)
BUN: 19 mg/dL (ref 7–25)
CO2: 30 mmol/L (ref 20–32)
Calcium: 9 mg/dL (ref 8.6–10.3)
Chloride: 101 mmol/L (ref 98–110)
Creat: 1.01 mg/dL (ref 0.70–1.11)
GFR, Est African American: 76 mL/min/{1.73_m2} (ref >=60)
GFR, Est Non African American: 65 mL/min/{1.73_m2} (ref >=60)
Globulin: 2.4 g/dL (calc) (ref 1.9–3.7)
Glucose, Bld: 119 mg/dL — ABNORMAL HIGH (ref 65–99)
Potassium: 4 mmol/L (ref 3.5–5.3)
Sodium: 142 mmol/L (ref 135–146)
Total Bilirubin: 0.4 mg/dL (ref 0.2–1.2)
Total Protein: 6.3 g/dL (ref 6.1–8.1)

## 2019-04-05 LAB — TSH: TSH: 2.33 mIU/L (ref 0.40–4.50)

## 2019-04-05 LAB — BRAIN NATRIURETIC PEPTIDE: Brain Natriuretic Peptide: 35 pg/mL (ref <100)

## 2019-04-05 NOTE — Assessment & Plan Note (Signed)
Well controlled. Continue current regimen. Follow up in  6 mo  

## 2019-04-05 NOTE — Assessment & Plan Note (Signed)
He admits he has been eating a lot of bread lately.  He says his wife has not been feeling well and has not been cooking and so has been eating a lot of bread and sandwiches.  Just really encouraged him to cut back on his portions of carbs and really try to increase his vegetable intake.

## 2019-04-06 NOTE — Progress Notes (Signed)
Cardiology Office Note:    Date:  04/07/2019   ID:  Bryan Hatfield, DOB 06/22/1926, MRN [id number]  PCP:  Metheney, Catherine D, MD  Cardiologist:  Henry W Smith III, MD   Referring MD: Metheney, Catherine D, *   Chief Complaint  Patient presents with  . Coronary Artery Disease    History of Present Illness:    Bryan Hatfield is a 85 y.o. male with a hx of CAD (s/p PTCA 2001 with moderate disease by cath 2017), HTN, HLD, hyperglycemia with A1C 7.5 in 06/2017 (previously pre-diabetic by A1Cs), COPD, OSA on CPAP, parapneumonic pleural effusion r/t PNA s/p thoracentesis 2008, h/o asbestos exposure, remote tobacco abuse (only 5 year history of smoking), probable chronic diastolic CHF improved on torsemide.  He is doing okay.  Breathing is markedly improved compared to the summer when furosemide was switched to torsemide.  He lost 20 pounds including significant peripheral edema.  He is a little concerned now that he has gained 20 pounds since that time.  He states that breathing and lower extremity swelling is not recurred.  He admits that perhaps he has gained weight because he has been eating more, less nutritious foods, and not been as active.  Past Medical History:  Diagnosis Date  . Acute respiratory failure (HCC) 09/25/11   hypoxic  . Anxiety   . Arthritis   . Bronchitis    "seems like 2x/year"  . CAD (coronary artery disease)    a. PTCA 2001. b. f/u cath 05/2012 heavy calcification, no PCI required. c. f/u cath for recurrent sx 2017: stable mod CAD unchanged from prior, mild progression of small vessel disease, elevated LVEDP.  . Colon polyps   . COPD (chronic obstructive pulmonary disease) (HCC)   . Diabetes mellitus without complication (HCC)   . GERD (gastroesophageal reflux disease)   . High cholesterol   . Hyperglycemia   . Hypertension   . Hypothyroidism   . Peripheral vascular disease (HCC)    patient denies knowledge of, but listed in his chart  . Pleural effusion    Parapneumonic ?r/t PNA s/p thoracentesis 2008  . Pneumonia    hx  . Sleep apnea    cpap     Past Surgical History:  Procedure Laterality Date  . CARDIAC CATHETERIZATION N/A 11/25/2015   Procedure: Left Heart Cath and Coronary Angiography;  Surgeon: David W Harding, MD;  [facility];  Service: Cardiovascular;  Laterality: N/A;  . CATARACT EXTRACTION W/ INTRAOCULAR LENS  IMPLANT, BILATERAL  ~ 2006  . CHOLECYSTECTOMY N/A 10/11/2012   Procedure: LAPAROSCOPIC CHOLECYSTECTOMY WITH INTRAOPERATIVE CHOLANGIOGRAM;  Surgeon: Haywood M Ingram, MD;  Location: [facility];  Service: General;  Laterality: N/A;  . COLONOSCOPY    . CORONARY ANGIOPLASTY  01/2000  . HEMORRHOID SURGERY  1960's  . KNEE ARTHROSCOPY Left 04/19/2017   Procedure: ARTHROSCOPY KNEE, SUBCHONDROPLASTY;  Surgeon: Graves, John, MD;  Location: [facility];  Service: Orthopedics;  Laterality: Left;  . KNEE ARTHROSCOPY WITH MEDIAL MENISECTOMY Left 04/19/2017   Procedure: KNEE ARTHROSCOPY WITH MEDIAL AND LATERAL MENISECTOMY;  Surgeon: Graves, John, MD;  [facility]: [facility];  Service: Orthopedics;  Laterality: Left;  . LAPAROSCOPIC RIGHT COLECTOMY N/A 08/31/2017   Procedure: LAPAROSCOPIC ASSISTED ASCENDING COLECTOMY;  Surgeon: Ingram, Haywood, MD;  [facility]: [facility];  Service: General;  Laterality: N/A;    Current Medications: Current Meds  Medication Sig  . albuterol (PROVENTIL HFA;VENTOLIN HFA) 108 (90 Base) MCG/ACT inhaler Inhale 2 puffs every 6 (six) hours   as needed into the lungs for wheezing or shortness of breath.  . AMBULATORY NON FORMULARY MEDICATION Medication Name: 4 prong cane. Dx: gait instability  . Ascorbic Acid (VITAMIN C PO) Take 1 tablet by mouth daily.  . aspirin EC 81 MG tablet Take 81 mg by mouth daily.    . atorvastatin (LIPITOR) 40 MG tablet TAKE 1 TABLET BY MOUTH AT  BEDTIME  . Blood Glucose Monitoring Suppl (ONE TOUCH ULTRA 2) w/Device KIT Onetouch ultra kit. DX: E11.9  . cetirizine (ZYRTEC) 10 MG tablet Take 10 mg  by mouth daily.  . escitalopram (LEXAPRO) 10 MG tablet Take 1 tablet (10 mg total) by mouth daily.  . fluticasone (FLONASE) 50 MCG/ACT nasal spray One spray in each nostril twice a day, use left hand for right nostril, and right hand for left nostril.  . folic acid (FOLVITE) 1 MG tablet Take 1 mg by mouth daily.  . glucose blood (ONE TOUCH ULTRA TEST) test strip For testing blood sugars twice daily.E11.9  . ipratropium-albuterol (DUONEB) 0.5-2.5 (3) MG/3ML SOLN USE 1 VIAL VIA NEBULIZER EVERY 2 HOURS AS NEEDED FOR WHEEZING, SHORTNESS OF BREATH.  . levothyroxine (SYNTHROID) 125 MCG tablet TAKE 1 TABLET BY MOUTH  DAILY BEFORE BREAKFAST  . metFORMIN (GLUCOPHAGE-XR) 500 MG 24 hr tablet TAKE 1 TABLET(500 MG) BY MOUTH DAILY WITH BREAKFAST  . Multiple Vitamins-Minerals (ICAPS AREDS 2) CAPS Take 1 capsule 2 (two) times daily by mouth.  . nitroGLYCERIN (NITROSTAT) 0.4 MG SL tablet DISSOLVE 1 TABLET UNDER THE TONGUE EVERY 5 MINUTES AS  NEEDED CHEST PAIN. NOT TO  EXCEED 3 TABLETS IN 15  MINUTES  . oxybutynin (DITROPAN-XL) 10 MG 24 hr tablet Take 10 mg by mouth at bedtime.  . potassium chloride (K-DUR) 10 MEQ tablet TAKE 1 TABLET(10 MEQ) BY MOUTH TWICE DAILY  . Tiotropium Bromide-Olodaterol (STIOLTO RESPIMAT) 2.5-2.5 MCG/ACT AERS Inhale 2 puffs into the lungs daily.  . torsemide (DEMADEX) 20 MG tablet Take 1 tablet (20 mg total) by mouth 2 (two) times daily.  . zolpidem (AMBIEN) 10 MG tablet TAKE 1 TABLET BY MOUTH AT BEDTIME AS NEEDED FOR SLEEP     Allergies:   Patient has no known allergies.   Social History   Socioeconomic History  . Marital status: Married    Spouse name: Sharon  . Number of children: 1  . Years of education: 9  . Highest education level: 9th grade  Occupational History  . Occupation: Retired    Comment: [employer]  Social Needs  . Financial resource strain: Not hard at all  . Food insecurity    Worry: Never true    Inability: Never true  . Transportation  needs    Medical: No    Non-medical: No  Tobacco Use  . Smoking status: Former Smoker    Packs/day: 1.00    Years: 10.00    Pack years: 10.00    Types: Cigarettes    Quit date: 06/01/1982    Years since quitting: 36.8  . Smokeless tobacco: Never Used  . Tobacco comment: "quit smoking ~ 25 years ago; smoked ~ 1 pk/wk"  Substance and Sexual Activity  . Alcohol use: Yes    Alcohol/week: 1.0 standard drinks    Types: 1 Cans of beer per week    Comment: occasionally  . Drug use: No  . Sexual activity: Never  Lifestyle  . Physical activity    Days per week: 0 days    Minutes per session: 0 min  .   Stress: Not at all  Relationships  . Social Herbalist on phone: Once a week    Gets together: Once a week    Attends religious service: More than 4 times per year    Active member of club or organization: Yes    Attends meetings of clubs or organizations: 1 to 4 times per year    Relationship status: Married  Other Topics Concern  . Not on file  Social History Narrative   Not exercising due to Charles was a member at the [facility]. Just had eye exam all clear.     Family History: The patient's family history includes Arthritis in his mother and sister; Asthma in his mother; Emphysema in his mother; Stroke in his father, mother, and sister. There is no history of Heart attack.  ROS:   Please see the history of present illness.    He denies orthopnea and angina.  No lower extremity edema.  No palpitations.  All other systems reviewed and are negative.  EKGs/Labs/Other Studies Reviewed:    The following studies were reviewed today: No new cardiac data.  EKG:  EKG not repeated  Recent Labs: 11/30/2018: NT-Pro BNP 119 04/04/2019: ALT 32; Brain Natriuretic Peptide 35; BUN 19; Creat 1.01; Hemoglobin 13.4; Platelets 195; Potassium 4.0; Sodium 142; TSH 2.33  Recent Lipid Panel    Component Value Date/Time   CHOL 150 01/25/2019 0930   TRIG 128 01/25/2019 0930   HDL 51 01/25/2019  0930   CHOLHDL 2.9 01/25/2019 0930   VLDL 17 09/05/2014 0837   LDLCALC 78 01/25/2019 0930    Physical Exam:    VS:  BP (!) 142/78   Pulse 85   Ht 5' 11" (1.803 m)   Wt 281 lb 6.4 oz (127.6 kg)   SpO2 95%   BMI 39.25 kg/m     Wt Readings from Last 3 Encounters:  04/07/19 281 lb 6.4 oz (127.6 kg)  04/04/19 284 lb (128.8 kg)  01/23/19 261 lb (118.4 kg)     GEN: Obese and elderly. No acute distress HEENT: Normal NECK: No JVD. LYMPHATICS: No lymphadenopathy CARDIAC:  RRR without murmur, gallop, or edema. VASCULAR:  Normal Pulses. No bruits. RESPIRATORY:  Clear to auscultation without rales, wheezing or rhonchi  ABDOMEN: Soft, non-tender, non-distended, No pulsatile mass, MUSCULOSKELETAL: No deformity  SKIN: Warm and dry NEUROLOGIC:  Alert and oriented x 3 PSYCHIATRIC:  Normal affect   ASSESSMENT:    1. Coronary artery disease involving native coronary artery of native heart with angina pectoris ([place])   2. Chronic diastolic CHF (congestive heart failure) ([place])   3. Essential hypertension, benign   4. Hyperlipidemia, unspecified hyperlipidemia type   5. COPD GOLD 0/I   6. Educated about COVID-19 virus infection    PLAN:    In order of problems listed above:  1. Not having angina.  Continue preventive measures including high intensity statin therapy. 2. Diastolic heart failure improved upon switching furosemide to torsemide.  There is no evidence of volume overload on today's exam. 3. Blood pressure is adequate for age. 4. High intensity lipid therapy being well-tolerated.  Last LDL 78. 5. Not discussed. 6. 3W's were discussed and endorsed by the patient   Medication Adjustments/Labs and Tests Ordered: Current medicines are reviewed at length with the patient today.  Concerns regarding medicines are outlined above.  No orders of the defined types were placed in this encounter.  No orders of the defined types were placed in this encounter.  There are no Patient  Instructions on file for this visit.   Signed, Sinclair Grooms, MD  04/07/2019 2:25 PM    [facility]

## 2019-04-07 ENCOUNTER — Ambulatory Visit: Payer: Medicare Other | Admitting: Interventional Cardiology

## 2019-04-07 ENCOUNTER — Other Ambulatory Visit: Payer: Self-pay

## 2019-04-07 ENCOUNTER — Encounter: Payer: Self-pay | Admitting: Interventional Cardiology

## 2019-04-07 VITALS — BP 142/78 | HR 85 | Ht 71.0 in | Wt 281.4 lb

## 2019-04-07 DIAGNOSIS — I10 Essential (primary) hypertension: Secondary | ICD-10-CM | POA: Diagnosis not present

## 2019-04-07 DIAGNOSIS — E785 Hyperlipidemia, unspecified: Secondary | ICD-10-CM | POA: Diagnosis not present

## 2019-04-07 DIAGNOSIS — J449 Chronic obstructive pulmonary disease, unspecified: Secondary | ICD-10-CM | POA: Diagnosis not present

## 2019-04-07 DIAGNOSIS — Z7189 Other specified counseling: Secondary | ICD-10-CM

## 2019-04-07 DIAGNOSIS — I25119 Atherosclerotic heart disease of native coronary artery with unspecified angina pectoris: Secondary | ICD-10-CM

## 2019-04-07 DIAGNOSIS — I5032 Chronic diastolic (congestive) heart failure: Secondary | ICD-10-CM | POA: Diagnosis not present

## 2019-04-07 NOTE — Patient Instructions (Signed)
Medication Instructions:  Your physician recommends that you continue on your current medications as directed. Please refer to the Current Medication list given to you today.  *If you need a refill on your cardiac medications before your next appointment, please call your pharmacy*  Lab Work: None If you have labs (blood work) drawn today and your tests are completely normal, you will receive your results only by: Marland Kitchen MyChart Message (if you have MyChart) OR . A paper copy in the mail If you have any lab test that is abnormal or we need to change your treatment, we will call you to review the results.  Testing/Procedures: None  Follow-Up: At [facility], you and your health needs are our priority.  As part of our continuing mission to provide you with exceptional heart care, we have created designated Provider Care Teams.  These Care Teams include your primary Cardiologist (physician) and Advanced Practice Providers (APPs -  Physician Assistants and Nurse Practitioners) who all work together to provide you with the care you need, when you need it.  Your next appointment:   6 months- 9 months  The format for your next appointment:   In Person  Provider:   You may see Sinclair Grooms, MD or one of the following Advanced Practice Providers on your designated Care Team:    Truitt Merle, NP  Cecilie Kicks, NP  Kathyrn Drown, NP   Other Instructions

## 2019-04-25 ENCOUNTER — Other Ambulatory Visit: Payer: Self-pay

## 2019-04-25 ENCOUNTER — Ambulatory Visit (INDEPENDENT_AMBULATORY_CARE_PROVIDER_SITE_OTHER): Payer: Medicare Other | Admitting: Family Medicine

## 2019-04-25 ENCOUNTER — Encounter: Payer: Self-pay | Admitting: Family Medicine

## 2019-04-25 VITALS — BP 131/64 | HR 93 | Ht 71.0 in | Wt 290.0 lb

## 2019-04-25 DIAGNOSIS — Z6841 Body Mass Index (BMI) 40.0 and over, adult: Secondary | ICD-10-CM

## 2019-04-25 DIAGNOSIS — E669 Obesity, unspecified: Secondary | ICD-10-CM | POA: Diagnosis not present

## 2019-04-25 DIAGNOSIS — F3342 Major depressive disorder, recurrent, in full remission: Secondary | ICD-10-CM | POA: Diagnosis not present

## 2019-04-25 DIAGNOSIS — E1169 Type 2 diabetes mellitus with other specified complication: Secondary | ICD-10-CM

## 2019-04-25 DIAGNOSIS — I10 Essential (primary) hypertension: Secondary | ICD-10-CM | POA: Diagnosis not present

## 2019-04-25 LAB — POCT UA - MICROALBUMIN
Albumin/Creatinine Ratio, Urine, POC: <30
Creatinine, POC: 50 mg/dL
Microalbumin Ur, POC: 10 mg/L

## 2019-04-25 LAB — POCT GLYCOSYLATED HEMOGLOBIN (HGB A1C): Hemoglobin A1C: 6.9 % — AB (ref 4.0–5.6)

## 2019-04-25 NOTE — Progress Notes (Signed)
Established Patient Office Visit  Subjective:  Patient ID: Bryan Hatfield, male    DOB: [birth year]  Age: 85 y.o. MRN: [id number]  CC:  Chief Complaint  Patient presents with  . Diabetes  . Hypertension    HPI Bryan Hatfield presents for   Hypertension- Pt denies chest pain, SOB, dizziness, or heart palpitations.  Taking meds as directed w/o problems.  Denies medication side effects.    Diabetes - no hypoglycemic events. No wounds or sores that are not healing well. No increased thirst or urination. Checking glucose at home. Taking medications as prescribed without any side effects.  He also brought in his Lexapro which see which she has been taking since 2016 and says that he does not take it anymore because he found out that it was made in [place].  He quit taking it yesterday.  He did let me know that he and his wife are actually moving back to [place] which is where they lived before they [place] to the [place] here the way back into the home.  He is actually happy about it is [place] and is hoping that will get him out more often and he thinks it will make his wife a lot happier as well.  She suffers from severe depression.   Past Medical History:  Diagnosis Date  . Acute respiratory failure ([place]) 09/25/11   hypoxic  . Anxiety   . Arthritis   . Bronchitis    "seems like 2x/year"  . CAD (coronary artery disease)    a. PTCA 2001. b. f/u cath 05/2012 heavy calcification, no PCI required. c. f/u cath for recurrent sx 2017: stable mod CAD unchanged from prior, mild progression of small vessel disease, elevated LVEDP.  . Colon polyps   . COPD (chronic obstructive pulmonary disease) ([place])   . Diabetes mellitus without complication ([place])   . GERD (gastroesophageal reflux disease)   . High cholesterol   . Hyperglycemia   . Hypertension   . Hypothyroidism   . Peripheral vascular disease ([place])    patient denies knowledge of, but listed in his chart  .  Pleural effusion    Parapneumonic ?r/t PNA s/p thoracentesis 2008  . Pneumonia    hx  . Sleep apnea    cpap     Past Surgical History:  Procedure Laterality Date  . CARDIAC CATHETERIZATION N/A 11/25/2015   Procedure: Left Heart Cath and Coronary Angiography;  Surgeon: Leonie Man, MD;  Location: [facility];  Service: Cardiovascular;  Laterality: N/A;  . CATARACT EXTRACTION W/ INTRAOCULAR LENS  IMPLANT, BILATERAL  ~ 2006  . CHOLECYSTECTOMY N/A 10/11/2012   Procedure: LAPAROSCOPIC CHOLECYSTECTOMY WITH INTRAOPERATIVE CHOLANGIOGRAM;  Surgeon: Adin Hector, MD;  Location: [facility];  Service: General;  Laterality: N/A;  . COLONOSCOPY    . CORONARY ANGIOPLASTY  01/2000  . HEMORRHOID SURGERY  1960's  . KNEE ARTHROSCOPY Left 04/19/2017   Procedure: ARTHROSCOPY KNEE, SUBCHONDROPLASTY;  Surgeon: Dorna Leitz, MD;  Location: [place];  Service: Orthopedics;  Laterality: Left;  . KNEE ARTHROSCOPY WITH MEDIAL MENISECTOMY Left 04/19/2017   Procedure: KNEE ARTHROSCOPY WITH MEDIAL AND LATERAL MENISECTOMY;  Surgeon: Dorna Leitz, MD;  Location: [place];  Service: Orthopedics;  Laterality: Left;  . LAPAROSCOPIC RIGHT COLECTOMY N/A 08/31/2017   Procedure: LAPAROSCOPIC ASSISTED ASCENDING COLECTOMY;  Surgeon: Fanny Skates, MD;  Location: [place];  Service: General;  Laterality: N/A;    Family History  Problem Relation Age  of Onset  . Stroke Mother   . Arthritis Mother   . Asthma Mother   . Emphysema Mother   . Stroke Father   . Arthritis Sister   . Stroke Sister   . Heart attack Neg Hx     Social History   Socioeconomic History  . Marital status: Married    Spouse name: Ivin Booty  . Number of children: 1  . Years of education: 30  . Highest education level: 9th grade  Occupational History  . 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 Financial resource strain: Not hard at all  . Food insecurity    Worry: Never true    Inability: Never true  .  Transportation needs    Medical: No    Non-medical: No  Tobacco Use  . Smoking status: Former Smoker    Packs/day: 1.00    Years: 10.00    Pack years: 10.00    Types: Cigarettes    Quit date: 06/01/1982    Years since quitting: 36.9  . Smokeless tobacco: Never Used  . Tobacco comment: "quit smoking ~ 25 years ago; smoked ~ 1 pk/wk"  Substance and Sexual Activity  . Alcohol use: Yes    Alcohol/week: 1.0 standard drinks    Types: 1 Cans of beer per week    Comment: occasionally  . Drug use: No  . Sexual activity: Never  Lifestyle  . Physical activity    Days per week: 0 days    Minutes per session: 0 min  . Stress: Not at all  Relationships  . Social Herbalist on phone: Once a week    Gets together: Once a week    Attends religious service: More than 4 times per year    Active member of club or organization: Yes    Attends meetings of clubs or organizations: 1 to 4 times per year    Relationship status: Married  . Intimate partner violence    Fear of current or ex partner: No    Emotionally abused: No    Physically abused: No    Forced sexual activity: No  Other Topics Concern  . Not on file  Social History Narrative   Not exercising due to Malin was a member at the [facility]. Just had eye exam all clear.    Outpatient Medications Prior to Visit  Medication Sig Dispense Refill  . albuterol (PROVENTIL HFA;VENTOLIN HFA) 108 (90 Base) MCG/ACT inhaler Inhale 2 puffs every 6 (six) hours as needed into the lungs for wheezing or shortness of breath.    . AMBULATORY NON FORMULARY MEDICATION Medication Name: 4 prong cane. Dx: gait instability 1 vial 0  . Ascorbic Acid (VITAMIN C PO) Take 1 tablet by mouth daily.    Marland Kitchen aspirin EC 81 MG tablet Take 81 mg by mouth daily.      Marland Kitchen atorvastatin (LIPITOR) 40 MG tablet TAKE 1 TABLET BY MOUTH AT  BEDTIME 90 tablet 2  . Blood Glucose Monitoring Suppl (ONE TOUCH ULTRA 2) w/Device KIT Onetouch ultra kit. DX: E11.9 1 each 0  .  cetirizine (ZYRTEC) 10 MG tablet Take 10 mg by mouth daily.    . fluticasone (FLONASE) 50 MCG/ACT nasal spray One spray in each nostril twice a day, use left hand for right nostril, and right hand for left nostril. 48 g 3  . folic acid (FOLVITE) 1 MG tablet Take 1 mg by mouth daily.    Marland Kitchen  glucose blood (ONE TOUCH ULTRA TEST) test strip For testing blood sugars twice daily.E11.9 200 each 12  . ipratropium-albuterol (DUONEB) 0.5-2.5 (3) MG/3ML SOLN USE 1 VIAL VIA NEBULIZER EVERY 2 HOURS AS NEEDED FOR WHEEZING, SHORTNESS OF BREATH. 4050 mL 3  . levothyroxine (SYNTHROID) 125 MCG tablet TAKE 1 TABLET BY MOUTH  DAILY BEFORE BREAKFAST 90 tablet 3  . metFORMIN (GLUCOPHAGE-XR) 500 MG 24 hr tablet TAKE 1 TABLET(500 MG) BY MOUTH DAILY WITH BREAKFAST 90 tablet 3  . Multiple Vitamins-Minerals (ICAPS AREDS 2) CAPS Take 1 capsule 2 (two) times daily by mouth.    . nitroGLYCERIN (NITROSTAT) 0.4 MG SL tablet DISSOLVE 1 TABLET UNDER THE TONGUE EVERY 5 MINUTES AS  NEEDED CHEST PAIN. NOT TO  EXCEED 3 TABLETS IN 15  MINUTES 25 tablet 4  . oxybutynin (DITROPAN-XL) 10 MG 24 hr tablet Take 10 mg by mouth at bedtime.    . potassium chloride (K-DUR) 10 MEQ tablet TAKE 1 TABLET(10 MEQ) BY MOUTH TWICE DAILY 180 tablet 3  . Tiotropium Bromide-Olodaterol (STIOLTO RESPIMAT) 2.5-2.5 MCG/ACT AERS Inhale 2 puffs into the lungs daily. 4 g 5  . zolpidem (AMBIEN) 10 MG tablet TAKE 1 TABLET BY MOUTH AT BEDTIME AS NEEDED FOR SLEEP 30 tablet 3  . escitalopram (LEXAPRO) 10 MG tablet Take 1 tablet (10 mg total) by mouth daily. (Patient not taking: Reported on 04/25/2019) 90 tablet 1  . torsemide (DEMADEX) 20 MG tablet Take 1 tablet (20 mg total) by mouth 2 (two) times daily. 180 tablet 1   No facility-administered medications prior to visit.     No Known Allergies  ROS Review of Systems    Objective:    Physical Exam  Constitutional: He is oriented to person, place, and time. He appears well-developed and well-nourished.  HENT:   Head: Normocephalic and atraumatic.  Cardiovascular: Normal rate, regular rhythm and normal heart sounds.  Pulmonary/Chest: Effort normal and breath sounds normal.  Neurological: He is alert and oriented to person, place, and time.  Skin: Skin is warm and dry.  Psychiatric: He has a normal mood and affect. His behavior is normal.    BP 131/64   Pulse 93   Ht 5' 11" (1.803 m)   Wt 290 lb (131.5 kg)   SpO2 98%   BMI 40.45 kg/m  Wt Readings from Last 3 Encounters:  04/25/19 290 lb (131.5 kg)  04/07/19 281 lb 6.4 oz (127.6 kg)  04/04/19 284 lb (128.8 kg)     Health Maintenance Due  Topic Date Due  . OPHTHALMOLOGY EXAM  08/01/1938  . URINE MICROALBUMIN  10/27/2018    There are no preventive care reminders to display for this patient.  Lab Results  Component Value Date   TSH 2.33 04/04/2019   Lab Results  Component Value Date   WBC 5.8 04/04/2019   HGB 13.4 04/04/2019   HCT 40.2 04/04/2019   MCV 94.4 04/04/2019   PLT 195 04/04/2019   Lab Results  Component Value Date   NA 142 04/04/2019   K 4.0 04/04/2019   CO2 30 04/04/2019   GLUCOSE 119 (H) 04/04/2019   BUN 19 04/04/2019   CREATININE 1.01 04/04/2019   BILITOT 0.4 04/04/2019   ALKPHOS 82 08/25/2017   AST 27 04/04/2019   ALT 32 04/04/2019   PROT 6.3 04/04/2019   ALBUMIN 3.4 (L) 08/25/2017   CALCIUM 9.0 04/04/2019   ANIONGAP 8 09/04/2017   GFR 89.45 05/29/2015   Lab Results  Component Value Date   CHOL  150 01/25/2019   Lab Results  Component Value Date   HDL 51 01/25/2019   Lab Results  Component Value Date   LDLCALC 78 01/25/2019   Lab Results  Component Value Date   TRIG 128 01/25/2019   Lab Results  Component Value Date   CHOLHDL 2.9 01/25/2019   Lab Results  Component Value Date   HGBA1C 6.9 (A) 04/25/2019      Assessment & Plan:   Problem List Items Addressed This Visit      Cardiovascular and Mediastinum   Essential hypertension, benign - Primary (Chronic)    Well  controlled. Continue current regimen. Follow up in  4 mo        Endocrine   Diabetes mellitus type 2 in obese [facility])    Also due for foot exam and microalbumin today.  A1c is up a little bit from previous today at 6.9 otherwise doing okay.  Follow-up in 4 months.        Relevant Orders   POCT HgB A1C (Completed)   POCT UA - Microalbumin (Completed)     Other   MDD (major depressive disorder), recurrent, in full remission ([place])    He would like to discontinue his Lexapro and says he quit taking it yesterday.  We have removed for medication list.  He was on it for 4 years if at any point he is feeling more down we can always restart the medication.       Other Visit Diagnoses    BMI 40.0-44.9, adult ([place])          BMI 40-unfortunately his weight is up again from the last time I saw him in the 6 pounds.  Again no sign of volume overload on exam.  I really think he is just eating too much and admits he has been eating a lot of bread lately and really not getting out and moving.  We discussed increasing activity level and cutting back on bread instead of having 2 slices with the same which only having one slice.  Not using bread for breakfast.  Really increasing his vegetable intake and we discussed the steam bags in the freezer section that are relatively expensive and do not have a lot of sauces and butters on.    No orders of the defined types were placed in this encounter.   Follow-up: Return in about 3 months (around 07/26/2019) for Diabetes follow-up.    Beatrice Lecher, MD

## 2019-04-25 NOTE — Assessment & Plan Note (Signed)
He would like to discontinue his Lexapro and says he quit taking it yesterday.  We have removed for medication list.  He was on it for 4 years if at any point he is feeling more down we can always restart the medication.

## 2019-04-25 NOTE — Assessment & Plan Note (Addendum)
Also due for foot exam and microalbumin today.  A1c is up a little bit from previous today at 6.9 otherwise doing okay.  Follow-up in 4 months.

## 2019-04-25 NOTE — Assessment & Plan Note (Signed)
Well controlled. Continue current regimen. Follow up in  4 mo 

## 2019-05-11 ENCOUNTER — Telehealth: Payer: Self-pay

## 2019-05-11 NOTE — Telephone Encounter (Signed)
Patient's daughter called stating that last week patient cut his hand and she states " he must have hit a vein because it turned black and looks really infected now"  Daughter also notes that he is having extreme wheezing and shortness of breath along with leg swelling.   Advised daughter that patient need to be taken to closes ER and evaluated ASAP. Daughter agreeable and will get patient to ER.  FYI to PCP

## 2019-05-12 ENCOUNTER — Ambulatory Visit: Payer: Medicare Other | Admitting: Family Medicine

## 2019-05-12 NOTE — Progress Notes (Deleted)
Acute Office Visit  Subjective:    Patient ID: Bryan Hatfield, male    DOB: June 21, 1926, 85 y.o.   MRN: [id number]  No chief complaint on file.   HPI Patient is in today for leg swelling.  His daughter had actually called the office yesterday saying that he was short of breath wheezing and complaining of some leg swelling.  At the time it was recommended that he go to the emergency department or urgent care as we did not have any available appointments.  Per records in care everywhere, it does not look like he sought care elsewhere.  Past Medical History:  Diagnosis Date  . Acute respiratory failure ([place]) 09/25/11   hypoxic  . Anxiety   . Arthritis   . Bronchitis    "seems like 2x/year"  . CAD (coronary artery disease)    a. PTCA 2001. b. f/u cath 05/2012 heavy calcification, no PCI required. c. f/u cath for recurrent sx 2017: stable mod CAD unchanged from prior, mild progression of small vessel disease, elevated LVEDP.  . Colon polyps   . COPD (chronic obstructive pulmonary disease) ([place])   . Diabetes mellitus without complication ([place])   . GERD (gastroesophageal reflux disease)   . High cholesterol   . Hyperglycemia   . Hypertension   . Hypothyroidism   . Peripheral vascular disease ([place])    patient denies knowledge of, but listed in his chart  . Pleural effusion    Parapneumonic ?r/t PNA s/p thoracentesis 2008  . Pneumonia    hx  . Sleep apnea    cpap     Past Surgical History:  Procedure Laterality Date  . CARDIAC CATHETERIZATION N/A 11/25/2015   Procedure: Left Heart Cath and Coronary Angiography;  Surgeon: Leonie Man, MD;  Location: [facility];  Service: Cardiovascular;  Laterality: N/A;  . CATARACT EXTRACTION W/ INTRAOCULAR LENS  IMPLANT, BILATERAL  ~ 2006  . CHOLECYSTECTOMY N/A 10/11/2012   Procedure: LAPAROSCOPIC CHOLECYSTECTOMY WITH INTRAOPERATIVE CHOLANGIOGRAM;  Surgeon: Adin Hector, MD;  Location: [facility];  Service: General;  Laterality: N/A;  .  COLONOSCOPY    . CORONARY ANGIOPLASTY  01/2000  . HEMORRHOID SURGERY  1960's  . KNEE ARTHROSCOPY Left 04/19/2017   Procedure: ARTHROSCOPY KNEE, SUBCHONDROPLASTY;  Surgeon: Dorna Leitz, MD;  Location: [facility];  Service: Orthopedics;  Laterality: Left;  . KNEE ARTHROSCOPY WITH MEDIAL MENISECTOMY Left 04/19/2017   Procedure: KNEE ARTHROSCOPY WITH MEDIAL AND LATERAL MENISECTOMY;  Surgeon: Dorna Leitz, MD;  Location: [facility];  Service: Orthopedics;  Laterality: Left;  . LAPAROSCOPIC RIGHT COLECTOMY N/A 08/31/2017   Procedure: LAPAROSCOPIC ASSISTED ASCENDING COLECTOMY;  Surgeon: Fanny Skates, MD;  Location: [facility];  Service: General;  Laterality: N/A;    Family History  Problem Relation Age of Onset  . Stroke Mother   . Arthritis Mother   . Asthma Mother   . Emphysema Mother   . Stroke Father   . Arthritis Sister   . Stroke Sister   . Heart attack Neg Hx     Social History   Socioeconomic History  . Marital status: Married    Spouse name: Ivin Booty  . Number of children: 1  . Years of education: 3  . Highest education level: 9th grade  Occupational History  . Occupation: Retired    Comment: Surveyor, quantity - [employer]  Tobacco Use  . Smoking status: Former Smoker    Packs/day: 1.00    Years: 10.00    Pack years: 10.00  Types: Cigarettes    Quit date: 06/01/1982    Years since quitting: 36.9  . Smokeless tobacco: Never Used  . Tobacco comment: "quit smoking ~ 25 years ago; smoked ~ 1 pk/wk"  Substance and Sexual Activity  . Alcohol use: Yes    Alcohol/week: 1.0 standard drinks    Types: 1 Cans of beer per week    Comment: occasionally  . Drug use: No  . Sexual activity: Never  Other Topics Concern  . Not on file  Social History Narrative   Not exercising due to [place] was a member at the [facility]. Just had eye exam all clear.   Social Determinants of Health   Financial Resource Strain: Low Risk   . Difficulty of Paying Living Expenses: Not hard at all  Food Insecurity: No  Food Insecurity  . Worried About Charity fundraiser in the Last Year: Never true  . Ran Out of Food in the Last Year: Never true  Transportation Needs: No Transportation Needs  . Lack of Transportation (Medical): No  . Lack of Transportation (Non-Medical): No  Physical Activity: Inactive  . Days of Exercise per Week: 0 days  . Minutes of Exercise per Session: 0 min  Stress: No Stress Concern Present  . Feeling of Stress : Not at all  Social Connections: Slightly Isolated  . Frequency of Communication with Friends and Family: Once a week  . Frequency of Social Gatherings with Friends and Family: Once a week  . Attends Religious Services: More than 4 times per year  . Active Member of Clubs or Organizations: Yes  . Attends Archivist Meetings: 1 to 4 times per year  . Marital Status: Married  Human resources officer Violence: Not At Risk  . Fear of Current or Ex-Partner: No  . Emotionally Abused: No  . Physically Abused: No  . Sexually Abused: No    Outpatient Medications Prior to Visit  Medication Sig Dispense Refill  . albuterol (PROVENTIL HFA;VENTOLIN HFA) 108 (90 Base) MCG/ACT inhaler Inhale 2 puffs every 6 (six) hours as needed into the lungs for wheezing or shortness of breath.    . AMBULATORY NON FORMULARY MEDICATION Medication Name: 4 prong cane. Dx: gait instability 1 vial 0  . Ascorbic Acid (VITAMIN C PO) Take 1 tablet by mouth daily.    Marland Kitchen aspirin EC 81 MG tablet Take 81 mg by mouth daily.      Marland Kitchen atorvastatin (LIPITOR) 40 MG tablet TAKE 1 TABLET BY MOUTH AT  BEDTIME 90 tablet 2  . Blood Glucose Monitoring Suppl (ONE TOUCH ULTRA 2) w/Device KIT Onetouch ultra kit. DX: E11.9 1 each 0  . cetirizine (ZYRTEC) 10 MG tablet Take 10 mg by mouth daily.    Marland Kitchen escitalopram (LEXAPRO) 10 MG tablet Take 1 tablet (10 mg total) by mouth daily. (Patient not taking: Reported on 04/25/2019) 90 tablet 1  . fluticasone (FLONASE) 50 MCG/ACT nasal spray One spray in each nostril twice a day,  use left hand for right nostril, and right hand for left nostril. 48 g 3  . folic acid (FOLVITE) 1 MG tablet Take 1 mg by mouth daily.    Marland Kitchen glucose blood (ONE TOUCH ULTRA TEST) test strip For testing blood sugars twice daily.E11.9 200 each 12  . ipratropium-albuterol (DUONEB) 0.5-2.5 (3) MG/3ML SOLN USE 1 VIAL VIA NEBULIZER EVERY 2 HOURS AS NEEDED FOR WHEEZING, SHORTNESS OF BREATH. 4050 mL 3  . levothyroxine (SYNTHROID) 125 MCG tablet TAKE 1 TABLET BY MOUTH  DAILY  BEFORE BREAKFAST 90 tablet 3  . metFORMIN (GLUCOPHAGE-XR) 500 MG 24 hr tablet TAKE 1 TABLET(500 MG) BY MOUTH DAILY WITH BREAKFAST 90 tablet 3  . Multiple Vitamins-Minerals (ICAPS AREDS 2) CAPS Take 1 capsule 2 (two) times daily by mouth.    . nitroGLYCERIN (NITROSTAT) 0.4 MG SL tablet DISSOLVE 1 TABLET UNDER THE TONGUE EVERY 5 MINUTES AS  NEEDED CHEST PAIN. NOT TO  EXCEED 3 TABLETS IN 15  MINUTES 25 tablet 4  . oxybutynin (DITROPAN-XL) 10 MG 24 hr tablet Take 10 mg by mouth at bedtime.    . potassium chloride (K-DUR) 10 MEQ tablet TAKE 1 TABLET(10 MEQ) BY MOUTH TWICE DAILY 180 tablet 3  . Tiotropium Bromide-Olodaterol (STIOLTO RESPIMAT) 2.5-2.5 MCG/ACT AERS Inhale 2 puffs into the lungs daily. 4 g 5  . torsemide (DEMADEX) 20 MG tablet Take 1 tablet (20 mg total) by mouth 2 (two) times daily. 180 tablet 1  . zolpidem (AMBIEN) 10 MG tablet TAKE 1 TABLET BY MOUTH AT BEDTIME AS NEEDED FOR SLEEP 30 tablet 3   No facility-administered medications prior to visit.    No Known Allergies  Review of Systems     Objective:    Physical Exam  There were no vitals taken for this visit. Wt Readings from Last 3 Encounters:  04/25/19 290 lb (131.5 kg)  04/07/19 281 lb 6.4 oz (127.6 kg)  04/04/19 284 lb (128.8 kg)    Health Maintenance Due  Topic Date Due  . OPHTHALMOLOGY EXAM  08/01/1938    There are no preventive care reminders to display for this patient.   Lab Results  Component Value Date   TSH 2.33 04/04/2019   Lab Results   Component Value Date   WBC 5.8 04/04/2019   HGB 13.4 04/04/2019   HCT 40.2 04/04/2019   MCV 94.4 04/04/2019   PLT 195 04/04/2019   Lab Results  Component Value Date   NA 142 04/04/2019   K 4.0 04/04/2019   CO2 30 04/04/2019   GLUCOSE 119 (H) 04/04/2019   BUN 19 04/04/2019   CREATININE 1.01 04/04/2019   BILITOT 0.4 04/04/2019   ALKPHOS 82 08/25/2017   AST 27 04/04/2019   ALT 32 04/04/2019   PROT 6.3 04/04/2019   ALBUMIN 3.4 (L) 08/25/2017   CALCIUM 9.0 04/04/2019   ANIONGAP 8 09/04/2017   GFR 89.45 05/29/2015   Lab Results  Component Value Date   CHOL 150 01/25/2019   Lab Results  Component Value Date   HDL 51 01/25/2019   Lab Results  Component Value Date   LDLCALC 78 01/25/2019   Lab Results  Component Value Date   TRIG 128 01/25/2019   Lab Results  Component Value Date   CHOLHDL 2.9 01/25/2019   Lab Results  Component Value Date   HGBA1C 6.9 (A) 04/25/2019       Assessment & Plan:   Problem List Items Addressed This Visit    None       No orders of the defined types were placed in this encounter.    Beatrice Lecher, MD

## 2019-05-13 DIAGNOSIS — E039 Hypothyroidism, unspecified: Secondary | ICD-10-CM | POA: Diagnosis not present

## 2019-05-13 DIAGNOSIS — R0600 Dyspnea, unspecified: Secondary | ICD-10-CM | POA: Diagnosis not present

## 2019-05-13 DIAGNOSIS — I509 Heart failure, unspecified: Secondary | ICD-10-CM | POA: Diagnosis not present

## 2019-05-13 DIAGNOSIS — E079 Disorder of thyroid, unspecified: Secondary | ICD-10-CM | POA: Diagnosis not present

## 2019-05-13 DIAGNOSIS — S61411A Laceration without foreign body of right hand, initial encounter: Secondary | ICD-10-CM | POA: Diagnosis not present

## 2019-05-13 DIAGNOSIS — Z7984 Long term (current) use of oral hypoglycemic drugs: Secondary | ICD-10-CM | POA: Diagnosis not present

## 2019-05-13 DIAGNOSIS — R600 Localized edema: Secondary | ICD-10-CM | POA: Diagnosis not present

## 2019-05-13 DIAGNOSIS — L03115 Cellulitis of right lower limb: Secondary | ICD-10-CM | POA: Diagnosis not present

## 2019-05-13 DIAGNOSIS — E119 Type 2 diabetes mellitus without complications: Secondary | ICD-10-CM | POA: Diagnosis not present

## 2019-05-13 DIAGNOSIS — E7800 Pure hypercholesterolemia, unspecified: Secondary | ICD-10-CM | POA: Diagnosis not present

## 2019-05-13 DIAGNOSIS — M7989 Other specified soft tissue disorders: Secondary | ICD-10-CM | POA: Diagnosis not present

## 2019-05-13 DIAGNOSIS — Z85828 Personal history of other malignant neoplasm of skin: Secondary | ICD-10-CM | POA: Diagnosis not present

## 2019-05-13 DIAGNOSIS — L03116 Cellulitis of left lower limb: Secondary | ICD-10-CM | POA: Diagnosis not present

## 2019-05-13 DIAGNOSIS — R609 Edema, unspecified: Secondary | ICD-10-CM | POA: Diagnosis not present

## 2019-05-18 ENCOUNTER — Ambulatory Visit: Payer: Medicare Other | Admitting: Family Medicine

## 2019-05-20 DIAGNOSIS — Z7984 Long term (current) use of oral hypoglycemic drugs: Secondary | ICD-10-CM | POA: Diagnosis not present

## 2019-05-20 DIAGNOSIS — E039 Hypothyroidism, unspecified: Secondary | ICD-10-CM | POA: Diagnosis not present

## 2019-05-20 DIAGNOSIS — Z7982 Long term (current) use of aspirin: Secondary | ICD-10-CM | POA: Diagnosis not present

## 2019-05-20 DIAGNOSIS — L03115 Cellulitis of right lower limb: Secondary | ICD-10-CM | POA: Diagnosis not present

## 2019-05-20 DIAGNOSIS — Z79899 Other long term (current) drug therapy: Secondary | ICD-10-CM | POA: Diagnosis not present

## 2019-05-20 DIAGNOSIS — E119 Type 2 diabetes mellitus without complications: Secondary | ICD-10-CM | POA: Diagnosis not present

## 2019-05-20 DIAGNOSIS — E785 Hyperlipidemia, unspecified: Secondary | ICD-10-CM | POA: Diagnosis not present

## 2019-05-20 DIAGNOSIS — L03116 Cellulitis of left lower limb: Secondary | ICD-10-CM | POA: Diagnosis not present

## 2019-05-20 DIAGNOSIS — I509 Heart failure, unspecified: Secondary | ICD-10-CM | POA: Diagnosis not present

## 2019-05-22 ENCOUNTER — Telehealth: Payer: Self-pay | Admitting: Family Medicine

## 2019-05-22 NOTE — Telephone Encounter (Signed)
Patient wife called and reports that the patient needs a hospital follow up with PCP.  Can you please call to schedule?

## 2019-05-23 ENCOUNTER — Telehealth: Payer: Self-pay

## 2019-05-23 NOTE — Telephone Encounter (Signed)
Is he talking about oxygen?  I do not think he is on oxygen therapy in which case we would have to order it from scratch and he would also have to have documented low oxygen levels for [place] to get this covered.  Maybe we need to clarify, I am not exactly sure what he is asking.  In my mind I think the company should be allowed to give him a loaner if they are having to work on his CPAP.

## 2019-05-23 NOTE — Telephone Encounter (Signed)
[name] called and states Bryan Hatfield's CPAP broke and they will not be able to fix it for a couple of weeks. She was wanting Dr Madilyn Fireman to order a concentrator for him until the CPAP is fixed. I didn't see any information in his chart about needing oxygen. Not sure what we could do to make this happen.

## 2019-05-24 ENCOUNTER — Other Ambulatory Visit: Payer: Medicare Other

## 2019-05-24 NOTE — Telephone Encounter (Signed)
His wife was wanting him to have oxygen. He has an appointment on Monday.

## 2019-05-24 NOTE — Telephone Encounter (Signed)
Appointment has been made. No further questions at this time.  

## 2019-05-29 ENCOUNTER — Encounter: Payer: Self-pay | Admitting: Family Medicine

## 2019-05-29 ENCOUNTER — Other Ambulatory Visit: Payer: Medicare Other

## 2019-05-29 ENCOUNTER — Other Ambulatory Visit: Payer: Self-pay

## 2019-05-29 ENCOUNTER — Inpatient Hospital Stay: Payer: Medicare Other | Admitting: Family Medicine

## 2019-05-29 ENCOUNTER — Ambulatory Visit (INDEPENDENT_AMBULATORY_CARE_PROVIDER_SITE_OTHER): Payer: Medicare Other | Admitting: Family Medicine

## 2019-05-29 VITALS — BP 133/60 | HR 70 | Ht 71.0 in | Wt 284.0 lb

## 2019-05-29 DIAGNOSIS — R0602 Shortness of breath: Secondary | ICD-10-CM | POA: Diagnosis not present

## 2019-05-29 DIAGNOSIS — J449 Chronic obstructive pulmonary disease, unspecified: Secondary | ICD-10-CM

## 2019-05-29 DIAGNOSIS — G4733 Obstructive sleep apnea (adult) (pediatric): Secondary | ICD-10-CM | POA: Diagnosis not present

## 2019-05-29 DIAGNOSIS — I878 Other specified disorders of veins: Secondary | ICD-10-CM | POA: Insufficient documentation

## 2019-05-29 DIAGNOSIS — Z9989 Dependence on other enabling machines and devices: Secondary | ICD-10-CM | POA: Diagnosis not present

## 2019-05-29 MED ORDER — STIOLTO RESPIMAT 2.5-2.5 MCG/ACT IN AERS: 2.0000 | INHALATION_SPRAY | Freq: Every day | RESPIRATORY_TRACT | 5 refills | Status: AC

## 2019-05-29 MED ORDER — AMBULATORY NON FORMULARY MEDICATION: 0 refills | Status: AC

## 2019-05-29 NOTE — Progress Notes (Signed)
Established Patient Office Visit  Subjective:  Patient ID: Bryan Hatfield, male    DOB: Aug 30, 1926  Age: 85 y.o. MRN: [id number]  CC:  Chief Complaint  Patient presents with  . Hospitalization Follow-up    HPI Bryan Hatfield presents for ED follow up.  His daughter had actually called [place] on December 10 stating that he may have hit a vein and it started to look like like maybe it was infected and he was also feeling more short of breath and wheezing and had some leg swelling.  The time he was encouraged to go to the emergency department. He had a negative COVID test on 12/12.  He says he was actually admitted to the hospital for a couple of days and then discharged home and then ended up going back.  He was diagnosed with cellulitis and was given clindamycin.  He thinks he has about 6 tabs left.  He has 2 more today and 3 tabs for tomorrow.  No fevers chills or sweats.  He was having some chills before he went to the ED.  He is also still been feeling a little bit short of breath.  He says he has not been able to find his inhaler but he thinks it is packed in a box somewhere and would like a new one sent to his pharmacy.  Obstructive sleep apnea-he has been tried to use a CPAP at night but was having some problems.  They have a that is coming in a couple times a week now and she was able to troubleshoot the CPAP machine and so it seems to be working much better.  He does have some chronic lower extremity edema but feels like it has gotten a little bit worse.  He does have compression stockings at home but says he really cannot get them on and if he does get them on then they tend to pinch.   Past Medical History:  Diagnosis Date  . Acute respiratory failure ([place]) 09/25/11   hypoxic  . Anxiety   . Arthritis   . Bronchitis    "seems like 2x/year"  . CAD (coronary artery disease)    a. PTCA 2001. b. f/u cath 05/2012 heavy calcification, no PCI required. c. f/u cath for recurrent sx 2017: stable  mod CAD unchanged from prior, mild progression of small vessel disease, elevated LVEDP.  . Colon polyps   . COPD (chronic obstructive pulmonary disease) ([place])   . Diabetes mellitus without complication ([place])   . GERD (gastroesophageal reflux disease)   . High cholesterol   . Hyperglycemia   . Hypertension   . Hypothyroidism   . Peripheral vascular disease ([place])    patient denies knowledge of, but listed in his chart  . Pleural effusion    Parapneumonic ?r/t PNA s/p thoracentesis 2008  . Pneumonia    hx  . Sleep apnea    cpap     Past Surgical History:  Procedure Laterality Date  . CARDIAC CATHETERIZATION N/A 11/25/2015   Procedure: Left Heart Cath and Coronary Angiography;  Surgeon: Leonie Man, MD;  Location: [facility];  Service: Cardiovascular;  Laterality: N/A;  . CATARACT EXTRACTION W/ INTRAOCULAR LENS  IMPLANT, BILATERAL  ~ 2006  . CHOLECYSTECTOMY N/A 10/11/2012   Procedure: LAPAROSCOPIC CHOLECYSTECTOMY WITH INTRAOPERATIVE CHOLANGIOGRAM;  Surgeon: Adin Hector, MD;  Location: [facility];  Service: General;  Laterality: N/A;  . COLONOSCOPY    . CORONARY ANGIOPLASTY  01/2000  . HEMORRHOID  SURGERY  1960's  . KNEE ARTHROSCOPY Left 04/19/2017   Procedure: ARTHROSCOPY KNEE, SUBCHONDROPLASTY;  Surgeon: Dorna Leitz, MD;  Location: [facility];  Service: Orthopedics;  Laterality: Left;  . KNEE ARTHROSCOPY WITH MEDIAL MENISECTOMY Left 04/19/2017   Procedure: KNEE ARTHROSCOPY WITH MEDIAL AND LATERAL MENISECTOMY;  Surgeon: Dorna Leitz, MD;  Location: [facility];  Service: Orthopedics;  Laterality: Left;  . LAPAROSCOPIC RIGHT COLECTOMY N/A 08/31/2017   Procedure: LAPAROSCOPIC ASSISTED ASCENDING COLECTOMY;  Surgeon: Fanny Skates, MD;  Location: [facility];  Service: General;  Laterality: N/A;    Family History  Problem Relation Age of Onset  . Stroke Mother   . Arthritis Mother   . Asthma Mother   . Emphysema Mother   . Stroke Father   . Arthritis Sister   . Stroke Sister   . Heart  attack Neg Hx     Social History   Socioeconomic History  . Marital status: Married    Spouse name: Ivin Booty  . Number of children: 1  . Years of education: 59  . Highest education level: 9th grade  Occupational History  . Occupation: Retired    Comment: Surveyor, quantity - [employer]  Tobacco Use  . Smoking status: Former Smoker    Packs/day: 1.00    Years: 10.00    Pack years: 10.00    Types: Cigarettes    Quit date: 06/01/1982    Years since quitting: 37.0  . Smokeless tobacco: Never Used  . Tobacco comment: "quit smoking ~ 25 years ago; smoked ~ 1 pk/wk"  Substance and Sexual Activity  . Alcohol use: Yes    Alcohol/week: 1.0 standard drinks    Types: 1 Cans of beer per week    Comment: occasionally  . Drug use: No  . Sexual activity: Never  Other Topics Concern  . Not on file  Social History Narrative   Not exercising due to [place] was a member at the [facility]. Just had eye exam all clear.   Social Determinants of Health   Financial Resource Strain: Low Risk   . Difficulty of Paying Living Expenses: Not hard at all  Food Insecurity: No Food Insecurity  . Worried About Charity fundraiser in the Last Year: Never true  . Ran Out of Food in the Last Year: Never true  Transportation Needs: No Transportation Needs  . Lack of Transportation (Medical): No  . Lack of Transportation (Non-Medical): No  Physical Activity: Inactive  . Days of Exercise per Week: 0 days  . Minutes of Exercise per Session: 0 min  Stress: No Stress Concern Present  . Feeling of Stress : Not at all  Social Connections: Slightly Isolated  . Frequency of Communication with Friends and Family: Once a week  . Frequency of Social Gatherings with Friends and Family: Once a week  . Attends Religious Services: More than 4 times per year  . Active Member of Clubs or Organizations: Yes  . Attends Archivist Meetings: 1 to 4 times per year  . Marital Status: Married  Human resources officer Violence: Not At  Risk  . Fear of Current or Ex-Partner: No  . Emotionally Abused: No  . Physically Abused: No  . Sexually Abused: No    Outpatient Medications Prior to Visit  Medication Sig Dispense Refill  . albuterol (PROVENTIL HFA;VENTOLIN HFA) 108 (90 Base) MCG/ACT inhaler Inhale 2 puffs every 6 (six) hours as needed into the lungs for wheezing or shortness of breath.    . AMBULATORY NON  FORMULARY MEDICATION Medication Name: 4 prong cane. Dx: gait instability 1 vial 0  . Ascorbic Acid (VITAMIN C PO) Take 1 tablet by mouth daily.    Marland Kitchen aspirin EC 81 MG tablet Take 81 mg by mouth daily.      Marland Kitchen atorvastatin (LIPITOR) 40 MG tablet TAKE 1 TABLET BY MOUTH AT  BEDTIME 90 tablet 2  . Blood Glucose Monitoring Suppl (ONE TOUCH ULTRA 2) w/Device KIT Onetouch ultra kit. DX: E11.9 1 each 0  . cetirizine (ZYRTEC) 10 MG tablet Take 10 mg by mouth daily.    . clindamycin (CLEOCIN) 300 MG capsule Take 300 mg by mouth 3 (three) times daily.    Marland Kitchen escitalopram (LEXAPRO) 10 MG tablet Take 1 tablet (10 mg total) by mouth daily. 90 tablet 1  . fluticasone (FLONASE) 50 MCG/ACT nasal spray One spray in each nostril twice a day, use left hand for right nostril, and right hand for left nostril. 48 g 3  . folic acid (FOLVITE) 1 MG tablet Take 1 mg by mouth daily.    Marland Kitchen glucose blood (ONE TOUCH ULTRA TEST) test strip For testing blood sugars twice daily.E11.9 200 each 12  . ipratropium-albuterol (DUONEB) 0.5-2.5 (3) MG/3ML SOLN USE 1 VIAL VIA NEBULIZER EVERY 2 HOURS AS NEEDED FOR WHEEZING, SHORTNESS OF BREATH. 4050 mL 3  . levothyroxine (SYNTHROID) 125 MCG tablet TAKE 1 TABLET BY MOUTH  DAILY BEFORE BREAKFAST 90 tablet 3  . metFORMIN (GLUCOPHAGE-XR) 500 MG 24 hr tablet TAKE 1 TABLET(500 MG) BY MOUTH DAILY WITH BREAKFAST 90 tablet 3  . Multiple Vitamins-Minerals (ICAPS AREDS 2) CAPS Take 1 capsule 2 (two) times daily by mouth.    . nitroGLYCERIN (NITROSTAT) 0.4 MG SL tablet DISSOLVE 1 TABLET UNDER THE TONGUE EVERY 5 MINUTES AS  NEEDED  CHEST PAIN. NOT TO  EXCEED 3 TABLETS IN 15  MINUTES 25 tablet 4  . oxybutynin (DITROPAN-XL) 10 MG 24 hr tablet Take 10 mg by mouth at bedtime.    . potassium chloride (K-DUR) 10 MEQ tablet TAKE 1 TABLET(10 MEQ) BY MOUTH TWICE DAILY 180 tablet 3  . torsemide (DEMADEX) 20 MG tablet Take 1 tablet (20 mg total) by mouth 2 (two) times daily. 180 tablet 1  . zolpidem (AMBIEN) 10 MG tablet TAKE 1 TABLET BY MOUTH AT BEDTIME AS NEEDED FOR SLEEP 30 tablet 3  . Tiotropium Bromide-Olodaterol (STIOLTO RESPIMAT) 2.5-2.5 MCG/ACT AERS Inhale 2 puffs into the lungs daily. 4 g 5   No facility-administered medications prior to visit.    No Known Allergies  ROS Review of Systems    Objective:    Physical Exam  Constitutional: He is oriented to person, place, and time. He appears well-developed and well-nourished.  HENT:  Head: Normocephalic and atraumatic.  Cardiovascular: Normal rate, regular rhythm and normal heart sounds.  Pulmonary/Chest: Effort normal and breath sounds normal.  Neurological: He is alert and oriented to person, place, and time.  Skin: Skin is warm and dry.  Slightly pink skin on his shins bilaterally with some dryness and scaling esp on the right lower leg.  2+ pitting edema around ankles and trace pitting up to his knees.    Psychiatric: He has a normal mood and affect. His behavior is normal.    BP 133/60   Pulse 70   Ht '5\' 11"'  (1.803 m)   Wt 284 lb (128.8 kg)   SpO2 97%   BMI 39.61 kg/m  Wt Readings from Last 3 Encounters:  05/29/19 284 lb (128.8 kg)  04/25/19 290 lb (131.5 kg)  04/07/19 281 lb 6.4 oz (127.6 kg)     Health Maintenance Due  Topic Date Due  . OPHTHALMOLOGY EXAM  08/01/1938    There are no preventive care reminders to display for this patient.  Lab Results  Component Value Date   TSH 2.33 04/04/2019   Lab Results  Component Value Date   WBC 5.8 04/04/2019   HGB 13.4 04/04/2019   HCT 40.2 04/04/2019   MCV 94.4 04/04/2019   PLT 195  04/04/2019   Lab Results  Component Value Date   NA 142 04/04/2019   K 4.0 04/04/2019   CO2 30 04/04/2019   GLUCOSE 119 (H) 04/04/2019   BUN 19 04/04/2019   CREATININE 1.01 04/04/2019   BILITOT 0.4 04/04/2019   ALKPHOS 82 08/25/2017   AST 27 04/04/2019   ALT 32 04/04/2019   PROT 6.3 04/04/2019   ALBUMIN 3.4 (L) 08/25/2017   CALCIUM 9.0 04/04/2019   ANIONGAP 8 09/04/2017   GFR 89.45 05/29/2015   Lab Results  Component Value Date   CHOL 150 01/25/2019   Lab Results  Component Value Date   HDL 51 01/25/2019   Lab Results  Component Value Date   LDLCALC 78 01/25/2019   Lab Results  Component Value Date   TRIG 128 01/25/2019   Lab Results  Component Value Date   CHOLHDL 2.9 01/25/2019   Lab Results  Component Value Date   HGBA1C 6.9 (A) 04/25/2019      Assessment & Plan:   Problem List Items Addressed This Visit      Respiratory   OSA on CPAP    CPAP machine and tubing seems to be working better.  He currently gets his supplies through the [place].  Urged him to use it consistently.      Relevant Orders   BASIC METABOLIC PANEL WITH GFR   COPD (chronic obstructive pulmonary disease) ([place])    Also refilled his Stiolto since he is unable to find it after his recent move.  He thinks it is in a packed box in storage.      Relevant Medications   Tiotropium Bromide-Olodaterol (STIOLTO RESPIMAT) 2.5-2.5 MCG/ACT AERS   Other Relevant Orders   BASIC METABOLIC PANEL WITH GFR     Other   Venous stasis - Primary    Have him go up on the diuretic temporarily for 2 days as well as getting compression stockings.  It sounds like he probably has the proper ones in regards to compression but really has significant difficulty getting them on so we will write for lower grade compression above-the-knee stockings.      Relevant Medications   AMBULATORY NON FORMULARY MEDICATION   Other Relevant Orders   BASIC METABOLIC PANEL WITH GFR    Other Visit Diagnoses    SOB  (shortness of breath)          SOB - walk test was normal today. Will schedule for spirometry.   Meds ordered this encounter  Medications  . Tiotropium Bromide-Olodaterol (STIOLTO RESPIMAT) 2.5-2.5 MCG/ACT AERS    Sig: Inhale 2 puffs into the lungs daily.    Dispense:  4 g    Refill:  5  . AMBULATORY NON FORMULARY MEDICATION    Sig: Medication Name: 15 to 20 cm water pressure above the knee compression stocks.  Diagnosis chronic lower extremity edema and venous stasis.    Dispense:  1 vial    Refill:  0    Follow-up:  Return in about 2 weeks (around 06/12/2019) for spirometry .    Beatrice Lecher, MD

## 2019-05-29 NOTE — Patient Instructions (Addendum)
Okay to take an extra tab of the torsemide for the next 2 days in a row.  Make sure you are weighing yourself daily. Also write for some letter grade compression stockings.  Make sure to complete the antibiotics.

## 2019-05-29 NOTE — Assessment & Plan Note (Signed)
CPAP machine and tubing seems to be working better.  He currently gets his supplies through the [place].  Urged him to use it consistently.

## 2019-05-29 NOTE — Assessment & Plan Note (Signed)
Have him go up on the diuretic temporarily for 2 days as well as getting compression stockings.  It sounds like he probably has the proper ones in regards to compression but really has significant difficulty getting them on so we will write for lower grade compression above-the-knee stockings.

## 2019-05-29 NOTE — Assessment & Plan Note (Signed)
Also refilled his Stiolto since he is unable to find it after his recent move.  He thinks it is in a packed box in storage.

## 2019-05-30 LAB — BASIC METABOLIC PANEL WITH GFR
BUN: 19 mg/dL (ref 7–25)
CO2: 28 mmol/L (ref 20–32)
Calcium: 8.6 mg/dL (ref 8.6–10.3)
Chloride: 103 mmol/L (ref 98–110)
Creat: 1.06 mg/dL (ref 0.70–1.11)
GFR, Est African American: 71 mL/min/{1.73_m2} (ref >=60)
GFR, Est Non African American: 61 mL/min/{1.73_m2} (ref >=60)
Glucose, Bld: 106 mg/dL — ABNORMAL HIGH (ref 65–99)
Potassium: 3.8 mmol/L (ref 3.5–5.3)
Sodium: 144 mmol/L (ref 135–146)

## 2019-05-31 ENCOUNTER — Other Ambulatory Visit: Payer: Medicare Other | Admitting: Family Medicine

## 2019-06-08 ENCOUNTER — Ambulatory Visit (INDEPENDENT_AMBULATORY_CARE_PROVIDER_SITE_OTHER): Payer: Medicare Other | Admitting: Family Medicine

## 2019-06-08 ENCOUNTER — Encounter: Payer: Self-pay | Admitting: Family Medicine

## 2019-06-08 ENCOUNTER — Other Ambulatory Visit: Payer: Self-pay

## 2019-06-08 VITALS — BP 171/60 | HR 81 | Ht 71.0 in | Wt 275.0 lb

## 2019-06-08 DIAGNOSIS — J449 Chronic obstructive pulmonary disease, unspecified: Secondary | ICD-10-CM

## 2019-06-08 NOTE — Progress Notes (Signed)
Established Patient Office Visit  Subjective:  Patient ID: Bryan Hatfield, male    DOB: May 05, 1927  Age: 86 y.o. MRN: [id number]  CC: No chief complaint on file.   HPI Bryan Hatfield presents for Spirometry.  He has a hx of COPD and at last OV mentioned he was more SOB with activity. He is on [employer].    Past Medical History:  Diagnosis Date  . Acute respiratory failure ([place]) 09/25/11   hypoxic  . Anxiety   . Arthritis   . Bronchitis    "seems like 2x/year"  . CAD (coronary artery disease)    a. PTCA 2001. b. f/u cath 05/2012 heavy calcification, no PCI required. c. f/u cath for recurrent sx 2017: stable mod CAD unchanged from prior, mild progression of small vessel disease, elevated LVEDP.  . Colon polyps   . COPD (chronic obstructive pulmonary disease) ([place])   . Diabetes mellitus without complication ([place])   . GERD (gastroesophageal reflux disease)   . High cholesterol   . Hyperglycemia   . Hypertension   . Hypothyroidism   . Peripheral vascular disease ([place])    patient denies knowledge of, but listed in his chart  . Pleural effusion    Parapneumonic ?r/t PNA s/p thoracentesis 2008  . Pneumonia    hx  . Sleep apnea    cpap     Past Surgical History:  Procedure Laterality Date  . CARDIAC CATHETERIZATION N/A 11/25/2015   Procedure: Left Heart Cath and Coronary Angiography;  Surgeon: Leonie Man, MD;  Location: [facility];  Service: Cardiovascular;  Laterality: N/A;  . CATARACT EXTRACTION W/ INTRAOCULAR LENS  IMPLANT, BILATERAL  ~ 2006  . CHOLECYSTECTOMY N/A 10/11/2012   Procedure: LAPAROSCOPIC CHOLECYSTECTOMY WITH INTRAOPERATIVE CHOLANGIOGRAM;  Surgeon: Adin Hector, MD;  Location: [facility];  Service: General;  Laterality: N/A;  . COLONOSCOPY    . CORONARY ANGIOPLASTY  01/2000  . HEMORRHOID SURGERY  1960's  . KNEE ARTHROSCOPY Left 04/19/2017   Procedure: ARTHROSCOPY KNEE, SUBCHONDROPLASTY;  Surgeon: Dorna Leitz, MD;  Location: [facility];  Service: Orthopedics;   Laterality: Left;  . KNEE ARTHROSCOPY WITH MEDIAL MENISECTOMY Left 04/19/2017   Procedure: KNEE ARTHROSCOPY WITH MEDIAL AND LATERAL MENISECTOMY;  Surgeon: Dorna Leitz, MD;  Location: [facility];  Service: Orthopedics;  Laterality: Left;  . LAPAROSCOPIC RIGHT COLECTOMY N/A 08/31/2017   Procedure: LAPAROSCOPIC ASSISTED ASCENDING COLECTOMY;  Surgeon: Fanny Skates, MD;  Location: [facility];  Service: General;  Laterality: N/A;    Family History  Problem Relation Age of Onset  . Stroke Mother   . Arthritis Mother   . Asthma Mother   . Emphysema Mother   . Stroke Father   . Arthritis Sister   . Stroke Sister   . Heart attack Neg Hx     Social History   Socioeconomic History  . Marital status: Married    Spouse name: Ivin Booty  . Number of children: 1  . Years of education: 69  . Highest education level: 9th grade  Occupational History  . Occupation: Retired    Comment: Surveyor, quantity - [employer]  Tobacco Use  . Smoking status: Former Smoker    Packs/day: 1.00    Years: 10.00    Pack years: 10.00    Types: Cigarettes    Quit date: 06/01/1982    Years since quitting: 37.0  . Smokeless tobacco: Never Used  . Tobacco comment: "quit smoking ~ 25 years ago; smoked ~ 1 pk/wk"  Substance  and Sexual Activity  . Alcohol use: Yes    Alcohol/week: 1.0 standard drinks    Types: 1 Cans of beer per week    Comment: occasionally  . Drug use: No  . Sexual activity: Never  Other Topics Concern  . Not on file  Social History Narrative   Not exercising due to [place] was a member at the [facility]. Just had eye exam all clear.   Social Determinants of Health   Financial Resource Strain: Low Risk   . Difficulty of Paying Living Expenses: Not hard at all  Food Insecurity: No Food Insecurity  . Worried About Charity fundraiser in the Last Year: Never true  . Ran Out of Food in the Last Year: Never true  Transportation Needs: No Transportation Needs  . Lack of Transportation (Medical): No  . Lack of  Transportation (Non-Medical): No  Physical Activity: Inactive  . Days of Exercise per Week: 0 days  . Minutes of Exercise per Session: 0 min  Stress: No Stress Concern Present  . Feeling of Stress : Not at all  Social Connections: Slightly Isolated  . Frequency of Communication with Friends and Family: Once a week  . Frequency of Social Gatherings with Friends and Family: Once a week  . Attends Religious Services: More than 4 times per year  . Active Member of Clubs or Organizations: Yes  . Attends Archivist Meetings: 1 to 4 times per year  . Marital Status: Married  Human resources officer Violence: Not At Risk  . Fear of Current or Ex-Partner: No  . Emotionally Abused: No  . Physically Abused: No  . Sexually Abused: No    Outpatient Medications Prior to Visit  Medication Sig Dispense Refill  . albuterol (PROVENTIL HFA;VENTOLIN HFA) 108 (90 Base) MCG/ACT inhaler Inhale 2 puffs every 6 (six) hours as needed into the lungs for wheezing or shortness of breath.    . AMBULATORY NON FORMULARY MEDICATION Medication Name: 4 prong cane. Dx: gait instability 1 vial 0  . AMBULATORY NON FORMULARY MEDICATION Medication Name: 15 to 20 cm water pressure above the knee compression stocks.  Diagnosis chronic lower extremity edema and venous stasis. 1 vial 0  . Ascorbic Acid (VITAMIN C PO) Take 1 tablet by mouth daily.    Marland Kitchen aspirin EC 81 MG tablet Take 81 mg by mouth daily.      Marland Kitchen atorvastatin (LIPITOR) 40 MG tablet TAKE 1 TABLET BY MOUTH AT  BEDTIME 90 tablet 2  . Blood Glucose Monitoring Suppl (ONE TOUCH ULTRA 2) w/Device KIT Onetouch ultra kit. DX: E11.9 1 each 0  . cetirizine (ZYRTEC) 10 MG tablet Take 10 mg by mouth daily.    Marland Kitchen escitalopram (LEXAPRO) 10 MG tablet Take 1 tablet (10 mg total) by mouth daily. 90 tablet 1  . fluticasone (FLONASE) 50 MCG/ACT nasal spray One spray in each nostril twice a day, use left hand for right nostril, and right hand for left nostril. 48 g 3  . folic acid  (FOLVITE) 1 MG tablet Take 1 mg by mouth daily.    Marland Kitchen glucose blood (ONE TOUCH ULTRA TEST) test strip For testing blood sugars twice daily.E11.9 200 each 12  . ipratropium-albuterol (DUONEB) 0.5-2.5 (3) MG/3ML SOLN USE 1 VIAL VIA NEBULIZER EVERY 2 HOURS AS NEEDED FOR WHEEZING, SHORTNESS OF BREATH. 4050 mL 3  . levothyroxine (SYNTHROID) 125 MCG tablet TAKE 1 TABLET BY MOUTH  DAILY BEFORE BREAKFAST 90 tablet 3  . metFORMIN (GLUCOPHAGE-XR) 500 MG 24  hr tablet TAKE 1 TABLET(500 MG) BY MOUTH DAILY WITH BREAKFAST 90 tablet 3  . Multiple Vitamins-Minerals (ICAPS AREDS 2) CAPS Take 1 capsule 2 (two) times daily by mouth.    . nitroGLYCERIN (NITROSTAT) 0.4 MG SL tablet DISSOLVE 1 TABLET UNDER THE TONGUE EVERY 5 MINUTES AS  NEEDED CHEST PAIN. NOT TO  EXCEED 3 TABLETS IN 15  MINUTES 25 tablet 4  . oxybutynin (DITROPAN-XL) 10 MG 24 hr tablet Take 10 mg by mouth at bedtime.    . potassium chloride (K-DUR) 10 MEQ tablet TAKE 1 TABLET(10 MEQ) BY MOUTH TWICE DAILY 180 tablet 3  . Tiotropium Bromide-Olodaterol (STIOLTO RESPIMAT) 2.5-2.5 MCG/ACT AERS Inhale 2 puffs into the lungs daily. 4 g 5  . torsemide (DEMADEX) 20 MG tablet Take 1 tablet (20 mg total) by mouth 2 (two) times daily. 180 tablet 1  . zolpidem (AMBIEN) 10 MG tablet TAKE 1 TABLET BY MOUTH AT BEDTIME AS NEEDED FOR SLEEP 30 tablet 3   No facility-administered medications prior to visit.    No Known Allergies  ROS Review of Systems    Objective:    Physical Exam  Constitutional: He is oriented to person, place, and time. He appears well-developed and well-nourished.  HENT:  Head: Normocephalic and atraumatic.  Eyes: Conjunctivae and EOM are normal.  Cardiovascular: Normal rate.  Pulmonary/Chest: Effort normal.  Neurological: He is alert and oriented to person, place, and time.  Skin: Skin is dry. No pallor.  Psychiatric: He has a normal mood and affect. His behavior is normal.  Vitals reviewed.   BP (!) 171/60   Pulse 81   Ht '5\' 11"'   (1.803 m)   Wt 275 lb (124.7 kg)   SpO2 96%   BMI 38.35 kg/m  Wt Readings from Last 3 Encounters:  06/08/19 275 lb (124.7 kg)  05/29/19 284 lb (128.8 kg)  04/25/19 290 lb (131.5 kg)     Health Maintenance Due  Topic Date Due  . OPHTHALMOLOGY EXAM  08/01/1938    There are no preventive care reminders to display for this patient.  Lab Results  Component Value Date   TSH 2.33 04/04/2019   Lab Results  Component Value Date   WBC 5.8 04/04/2019   HGB 13.4 04/04/2019   HCT 40.2 04/04/2019   MCV 94.4 04/04/2019   PLT 195 04/04/2019   Lab Results  Component Value Date   NA 144 05/29/2019   K 3.8 05/29/2019   CO2 28 05/29/2019   GLUCOSE 106 (H) 05/29/2019   BUN 19 05/29/2019   CREATININE 1.06 05/29/2019   BILITOT 0.4 04/04/2019   ALKPHOS 82 08/25/2017   AST 27 04/04/2019   ALT 32 04/04/2019   PROT 6.3 04/04/2019   ALBUMIN 3.4 (L) 08/25/2017   CALCIUM 8.6 05/29/2019   ANIONGAP 8 09/04/2017   GFR 89.45 05/29/2015   Lab Results  Component Value Date   CHOL 150 01/25/2019   Lab Results  Component Value Date   HDL 51 01/25/2019   Lab Results  Component Value Date   LDLCALC 78 01/25/2019   Lab Results  Component Value Date   TRIG 128 01/25/2019   Lab Results  Component Value Date   CHOLHDL 2.9 01/25/2019   Lab Results  Component Value Date   HGBA1C 6.9 (A) 04/25/2019      Assessment & Plan:   Problem List Items Addressed This Visit      Respiratory   COPD (chronic obstructive pulmonary disease) ([place]) - Primary  Spirometry 06/08/2019 shows FVC of 77%, FEV1 of 91% with ratio of 81.  Actually shows normal spirometry with some restriction with low  FVC. No sig response to albuterol.           No orders of the defined types were placed in this encounter.   Follow-up: Return if symptoms worsen or fail to improve.    Beatrice Lecher, MD

## 2019-06-09 NOTE — Assessment & Plan Note (Signed)
Spirometry 06/08/2019 shows FVC of 77%, FEV1 of 91% with ratio of 81.  Actually shows normal spirometry with some restriction with low  FVC. No sig response to albuterol.

## 2019-06-11 ENCOUNTER — Emergency Department (HOSPITAL_COMMUNITY)
Admission: EM | Admit: 2019-06-11 | Discharge: 2019-06-11 | Disposition: A | Discharge status: Home / Self Care | Payer: Medicare Other | Attending: Emergency Medicine | Admitting: Emergency Medicine

## 2019-06-11 ENCOUNTER — Emergency Department (HOSPITAL_COMMUNITY): Payer: Medicare Other

## 2019-06-11 ENCOUNTER — Other Ambulatory Visit: Payer: Self-pay

## 2019-06-11 ENCOUNTER — Emergency Department (HOSPITAL_BASED_OUTPATIENT_CLINIC_OR_DEPARTMENT_OTHER): Payer: Medicare Other

## 2019-06-11 ENCOUNTER — Encounter (HOSPITAL_COMMUNITY): Payer: Self-pay | Admitting: Emergency Medicine

## 2019-06-11 ENCOUNTER — Other Ambulatory Visit: Payer: Self-pay | Admitting: Family Medicine

## 2019-06-11 DIAGNOSIS — Z79899 Other long term (current) drug therapy: Secondary | ICD-10-CM | POA: Insufficient documentation

## 2019-06-11 DIAGNOSIS — Z87891 Personal history of nicotine dependence: Secondary | ICD-10-CM | POA: Diagnosis not present

## 2019-06-11 DIAGNOSIS — I5032 Chronic diastolic (congestive) heart failure: Secondary | ICD-10-CM | POA: Insufficient documentation

## 2019-06-11 DIAGNOSIS — J441 Chronic obstructive pulmonary disease with (acute) exacerbation: Secondary | ICD-10-CM | POA: Diagnosis not present

## 2019-06-11 DIAGNOSIS — I110 Hypertensive heart disease with heart failure: Secondary | ICD-10-CM | POA: Insufficient documentation

## 2019-06-11 DIAGNOSIS — J449 Chronic obstructive pulmonary disease, unspecified: Secondary | ICD-10-CM | POA: Diagnosis not present

## 2019-06-11 DIAGNOSIS — I2510 Atherosclerotic heart disease of native coronary artery without angina pectoris: Secondary | ICD-10-CM | POA: Diagnosis not present

## 2019-06-11 DIAGNOSIS — E039 Hypothyroidism, unspecified: Secondary | ICD-10-CM | POA: Diagnosis not present

## 2019-06-11 DIAGNOSIS — Z743 Need for continuous supervision: Secondary | ICD-10-CM | POA: Diagnosis not present

## 2019-06-11 DIAGNOSIS — R0602 Shortness of breath: Secondary | ICD-10-CM | POA: Diagnosis not present

## 2019-06-11 DIAGNOSIS — R0600 Dyspnea, unspecified: Secondary | ICD-10-CM | POA: Diagnosis not present

## 2019-06-11 DIAGNOSIS — E119 Type 2 diabetes mellitus without complications: Secondary | ICD-10-CM | POA: Insufficient documentation

## 2019-06-11 DIAGNOSIS — Z7982 Long term (current) use of aspirin: Secondary | ICD-10-CM | POA: Insufficient documentation

## 2019-06-11 DIAGNOSIS — R069 Unspecified abnormalities of breathing: Secondary | ICD-10-CM | POA: Diagnosis not present

## 2019-06-11 DIAGNOSIS — Z7984 Long term (current) use of oral hypoglycemic drugs: Secondary | ICD-10-CM | POA: Insufficient documentation

## 2019-06-11 DIAGNOSIS — I10 Essential (primary) hypertension: Secondary | ICD-10-CM | POA: Diagnosis not present

## 2019-06-11 LAB — COMPREHENSIVE METABOLIC PANEL
ALT: 27 U/L (ref 0–44)
AST: 26 U/L (ref 15–41)
Albumin: 3.6 g/dL (ref 3.5–5.0)
Alkaline Phosphatase: 84 U/L (ref 38–126)
Anion gap: 12 (ref 5–15)
BUN: 23 mg/dL (ref 8–23)
CO2: 27 mmol/L (ref 22–32)
Calcium: 9 mg/dL (ref 8.9–10.3)
Chloride: 104 mmol/L (ref 98–111)
Creatinine, Ser: 1.1 mg/dL (ref 0.61–1.24)
GFR calc Af Amer: >60 mL/min (ref >60)
GFR calc non Af Amer: 59 mL/min — ABNORMAL LOW (ref >60)
Glucose, Bld: 141 mg/dL — ABNORMAL HIGH (ref 70–99)
Potassium: 3.8 mmol/L (ref 3.5–5.1)
Sodium: 143 mmol/L (ref 135–145)
Total Bilirubin: 0.7 mg/dL (ref 0.3–1.2)
Total Protein: 6.6 g/dL (ref 6.5–8.1)

## 2019-06-11 LAB — CBC WITH DIFFERENTIAL/PLATELET
Abs Immature Granulocytes: 0.02 10*3/uL (ref 0.00–0.07)
Basophils Absolute: 0 10*3/uL (ref 0.0–0.1)
Basophils Relative: 0 %
Eosinophils Absolute: 0.3 10*3/uL (ref 0.0–0.5)
Eosinophils Relative: 6 %
HCT: 42.2 % (ref 39.0–52.0)
Hemoglobin: 13.2 g/dL (ref 13.0–17.0)
Immature Granulocytes: 0 %
Lymphocytes Relative: 28 %
Lymphs Abs: 1.7 10*3/uL (ref 0.7–4.0)
MCH: 30.4 pg (ref 26.0–34.0)
MCHC: 31.3 g/dL (ref 30.0–36.0)
MCV: 97.2 fL (ref 80.0–100.0)
Monocytes Absolute: 0.5 10*3/uL (ref 0.1–1.0)
Monocytes Relative: 8 %
Neutro Abs: 3.6 10*3/uL (ref 1.7–7.7)
Neutrophils Relative %: 58 %
Platelets: 183 10*3/uL (ref 150–400)
RBC: 4.34 MIL/uL (ref 4.22–5.81)
RDW: 13.6 % (ref 11.5–15.5)
WBC: 6.2 10*3/uL (ref 4.0–10.5)
nRBC: 0 % (ref 0.0–0.2)

## 2019-06-11 LAB — BRAIN NATRIURETIC PEPTIDE: B Natriuretic Peptide: 72 pg/mL (ref 0.0–100.0)

## 2019-06-11 MED ORDER — PREDNISONE 10 MG PO TABS: 40.0000 mg | ORAL_TABLET | Freq: Every day | ORAL | 0 refills | Status: DC

## 2019-06-11 MED ORDER — PREDNISONE 50 MG PO TABS
60.0000 mg | ORAL_TABLET | Freq: Once | ORAL | Status: AC
  Administered 2019-06-11: 60 mg via ORAL
  Filled 2019-06-11: qty 1

## 2019-06-11 MED ORDER — ALBUTEROL SULFATE HFA 108 (90 BASE) MCG/ACT IN AERS
2.0000 | INHALATION_SPRAY | Freq: Once | RESPIRATORY_TRACT | Status: AC
  Administered 2019-06-11: 10:00:00 2 via RESPIRATORY_TRACT
  Filled 2019-06-11: qty 6.7

## 2019-06-11 MED ORDER — IOHEXOL 350 MG/ML SOLN
100.0000 mL | Freq: Once | INTRAVENOUS | Status: AC | PRN
  Administered 2019-06-11: 100 mL via INTRAVENOUS

## 2019-06-11 NOTE — ED Triage Notes (Signed)
Per EMS patient from home complaining of shortness of breath. Patient states Shortness of breath x 3 weeks. Denies being around anyone sick. States history of COPD.

## 2019-06-11 NOTE — ED Provider Notes (Signed)
[facility] Provider Note   CSN: [id number] Arrival date & time: 06/11/19  8242     History Chief Complaint  Patient presents with  . Shortness of Breath    Bryan Hatfield is a 86 y.o. male.  Patient brought in by EMS.  For persistent shortness of breath.  Patient followed by primary care at [facility].  Seen by them on the seventh.  They thought he was having COPD exacerbation.  Patient is also has a past medical history of diastolic congestive heart failure followed by Bryan Hatfield.  Last seen by them in August.  Patient states he does have some mild leg swelling.  Has had persistent shortness of breath.  Does have the ability use oxygen at home.  He said sometimes he has taken up to 5 L.  Usually he uses 3 L.  On room air here patient is satting 97%.  But he gets very shortness of breath with any kind of exertion.  Patient denies any fevers any Covid exposure.  Shortness of breath has been ongoing for several weeks.  Just not getting better.        Past Medical History:  Diagnosis Date  . Acute respiratory failure ([place]) 09/25/11   hypoxic  . Anxiety   . Arthritis   . Bronchitis    "seems like 2x/year"  . CAD (coronary artery disease)    a. PTCA 2001. b. f/u cath 05/2012 heavy calcification, no PCI required. c. f/u cath for recurrent sx 2017: stable mod CAD unchanged from prior, mild progression of small vessel disease, elevated LVEDP.  . Colon polyps   . COPD (chronic obstructive pulmonary disease) ([place])   . Diabetes mellitus without complication ([place])   . GERD (gastroesophageal reflux disease)   . High cholesterol   . Hyperglycemia   . Hypertension   . Hypothyroidism   . Peripheral vascular disease ([place])    patient denies knowledge of, but listed in his chart  . Pleural effusion    Parapneumonic ?r/t PNA s/p thoracentesis 2008  . Pneumonia    hx  . Sleep apnea    cpap     Patient Active Problem List   Diagnosis Date Noted  .  Venous stasis 05/29/2019  . Severe obesity (BMI 35.0-39.9) with comorbidity ([place]) 01/23/2019  . History of squamous cell carcinoma 08/04/2018  . CAD (coronary artery disease) 09/02/2017  . Peripheral vascular disease ([place]) 09/02/2017  . Acute kidney injury ([place]) 09/02/2017  . Acute blood loss anemia 09/02/2017  . COPD (chronic obstructive pulmonary disease) ([place]) 09/02/2017  . High cholesterol 09/02/2017  . Diabetes mellitus type 2 in obese ([place]) 09/02/2017  . BMI 39.0-39.9,adult 09/02/2017  . Chronic diastolic heart failure ([place]) 09/02/2017  . OSA on CPAP 09/02/2017  . Thrombocytopenia ([place]) 09/02/2017  . Mass of hepatic flexure of colon 08/31/2017  . 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 Acute medial meniscus tear of left knee 04/19/2017  . Primary osteoarthritis of left knee 03/01/2017  . Primary insomnia 02/19/2015  . GAD (generalized anxiety disorder) 11/21/2014  . Arterial hypotension 09/17/2014  . Hypothyroid 09/07/2014  . Essential hypertension, benign 06/02/2013  . Hyperlipidemia 06/02/2013  . Depression 06/02/2013  . MDD (major depressive disorder), recurrent, in full remission ([place]) 06/02/2013  . Coronary artery disease involving native coronary artery of native heart with angina pectoris ([place]) 05/09/2012  . Morbid obesity ([place]) 09/25/2011  . COPD GOLD 0/I 09/21/2007  . Sleep apnea 09/20/2007  Past Surgical History:  Procedure Laterality Date  . CARDIAC CATHETERIZATION N/A 11/25/2015   Procedure: Left Heart Cath and Coronary Angiography;  Surgeon: Leonie Man, MD;  Location: [facility];  Service: Cardiovascular;  Laterality: N/A;  . CATARACT EXTRACTION W/ INTRAOCULAR LENS  IMPLANT, BILATERAL  ~ 2006  . CHOLECYSTECTOMY N/A 10/11/2012   Procedure: LAPAROSCOPIC CHOLECYSTECTOMY WITH INTRAOPERATIVE CHOLANGIOGRAM;  Surgeon: Adin Hector, MD;  Location: [facility];  Service: General;  Laterality: N/A;  . COLONOSCOPY    . CORONARY ANGIOPLASTY   01/2000  . HEMORRHOID SURGERY  1960's  . KNEE ARTHROSCOPY Left 04/19/2017   Procedure: ARTHROSCOPY KNEE, SUBCHONDROPLASTY;  Surgeon: Dorna Leitz, MD;  Location: [facility];  Service: Orthopedics;  Laterality: Left;  . KNEE ARTHROSCOPY WITH MEDIAL MENISECTOMY Left 04/19/2017   Procedure: KNEE ARTHROSCOPY WITH MEDIAL AND LATERAL MENISECTOMY;  Surgeon: Dorna Leitz, MD;  Location: [facility];  Service: Orthopedics;  Laterality: Left;  . LAPAROSCOPIC RIGHT COLECTOMY N/A 08/31/2017   Procedure: LAPAROSCOPIC ASSISTED ASCENDING COLECTOMY;  Surgeon: Fanny Skates, MD;  Location: [facility];  Service: General;  Laterality: N/A;       Family History  Problem Relation Age of Onset  . Stroke Mother   . Arthritis Mother   . Asthma Mother   . Emphysema Mother   . Stroke Father   . Arthritis Sister   . Stroke Sister   . Heart attack Neg Hx     Social History   Tobacco Use  . Smoking status: Former Smoker    Packs/day: 1.00    Years: 10.00    Pack years: 10.00    Types: Cigarettes    Quit date: 06/01/1982    Years since quitting: 37.0  . Smokeless tobacco: Never Used  . Tobacco comment: "quit smoking ~ 25 years ago; smoked ~ 1 pk/wk"  Substance Use Topics  . Alcohol use: Yes    Alcohol/week: 1.0 standard drinks    Types: 1 Cans of beer per week    Comment: occasionally  . Drug use: No    Home Medications Prior to Admission medications   Medication Sig Start Date End Date Taking? Authorizing Provider  albuterol (PROVENTIL HFA;VENTOLIN HFA) 108 (90 Base) MCG/ACT inhaler Inhale 2 puffs every 6 (six) hours as needed into the lungs for wheezing or shortness of breath.    [provider]  AMBULATORY NON FORMULARY MEDICATION Medication Name: 4 prong cane. Dx: gait instability 03/09/17   Bryan Marry, MD  AMBULATORY NON FORMULARY MEDICATION Medication Name: 15 to 20 cm water pressure above the knee compression stocks.  Diagnosis chronic lower extremity edema and venous stasis. 05/29/19    Bryan Marry, MD  Ascorbic Acid (VITAMIN C PO) Take 1 tablet by mouth daily.    [provider]  aspirin EC 81 MG tablet Take 81 mg by mouth daily.      [provider]  atorvastatin (LIPITOR) 40 MG tablet TAKE 1 TABLET BY MOUTH AT  BEDTIME 09/19/18   Bryan Marry, MD  Blood Glucose Monitoring Suppl (ONE TOUCH ULTRA 2) w/Device KIT Onetouch ultra kit. DX: E11.9 09/21/17   Bryan Marry, MD  cetirizine (ZYRTEC) 10 MG tablet Take 10 mg by mouth daily.    [provider]  escitalopram (LEXAPRO) 10 MG tablet Take 1 tablet (10 mg total) by mouth daily. 03/06/19   Bryan Marry, MD  fluticasone (FLONASE) 50 MCG/ACT nasal spray One spray in each nostril twice a day, use left hand  for right nostril, and right hand for left nostril. 05/31/18   Donella Stade, PA-C  folic acid (FOLVITE) 1 MG tablet Take 1 mg by mouth daily. 10/19/18   [provider]  glucose blood (ONE TOUCH ULTRA TEST) test strip For testing blood sugars twice daily.E11.9 09/29/17   Bryan Marry, MD  ipratropium-albuterol (DUONEB) 0.5-2.5 (3) MG/3ML SOLN USE 1 VIAL VIA NEBULIZER EVERY 2 HOURS AS NEEDED FOR WHEEZING, SHORTNESS OF BREATH. 02/12/17   Bryan Marry, MD  levothyroxine (SYNTHROID) 125 MCG tablet TAKE 1 TABLET BY MOUTH  DAILY BEFORE BREAKFAST 12/07/18   Bryan Marry, MD  metFORMIN (GLUCOPHAGE-XR) 500 MG 24 hr tablet TAKE 1 TABLET(500 MG) BY MOUTH DAILY WITH BREAKFAST 10/21/18   Bryan Marry, MD  Multiple Vitamins-Minerals (ICAPS AREDS 2) CAPS Take 1 capsule 2 (two) times daily by mouth.    [provider]  nitroGLYCERIN (NITROSTAT) 0.4 MG SL tablet DISSOLVE 1 TABLET UNDER THE TONGUE EVERY 5 MINUTES AS  NEEDED CHEST PAIN. NOT TO  EXCEED 3 TABLETS IN 15  MINUTES 09/24/17   Bryan Marry, MD  oxybutynin (DITROPAN-XL) 10 MG 24 hr tablet Take 10 mg by mouth at bedtime.    [provider]  potassium chloride (K-DUR)  10 MEQ tablet TAKE 1 TABLET(10 MEQ) BY MOUTH TWICE DAILY 02/17/19   Belva Crome, MD  predniSONE (DELTASONE) 10 MG tablet Take 4 tablets (40 mg total) by mouth daily. 06/11/19   Fredia Sorrow, MD  Tiotropium Bromide-Olodaterol (STIOLTO RESPIMAT) 2.5-2.5 MCG/ACT AERS Inhale 2 puffs into the lungs daily. 05/29/19   Bryan Marry, MD  torsemide (DEMADEX) 20 MG tablet Take 1 tablet (20 mg total) by mouth 2 (two) times daily. 01/23/19 08/27/19  Bryan Marry, MD  zolpidem (AMBIEN) 10 MG tablet TAKE 1 TABLET BY MOUTH AT BEDTIME AS NEEDED FOR SLEEP 02/07/19   Bryan Marry, MD    Allergies    Patient has no known allergies.  Review of Systems   Review of Systems  Constitutional: Negative for chills and fever.  HENT: Negative for rhinorrhea and sore throat.   Eyes: Negative for visual disturbance.  Respiratory: Positive for shortness of breath. Negative for cough.   Cardiovascular: Positive for leg swelling. Negative for chest pain.  Gastrointestinal: Negative for abdominal pain, diarrhea, nausea and vomiting.  Genitourinary: Negative for dysuria.  Musculoskeletal: Negative for back pain and neck pain.  Skin: Negative for rash.  Neurological: Negative for dizziness, light-headedness and headaches.  Hematological: Does not bruise/bleed easily.  Psychiatric/Behavioral: Negative for confusion.    Physical Exam Updated Vital Signs BP 126/75   Pulse 96   Resp (!) 22   Ht 1.803 m ('5\' 11"'$ )   Wt 124.7 kg   SpO2 98%   BMI 38.35 kg/m   Physical Exam Vitals and nursing note reviewed.  Constitutional:      Appearance: Normal appearance. He is well-developed.  HENT:     Head: Normocephalic and atraumatic.  Eyes:     Extraocular Movements: Extraocular movements intact.     Conjunctiva/sclera: Conjunctivae normal.     Pupils: Pupils are equal, round, and reactive to light.  Cardiovascular:     Rate and Rhythm: Normal rate and regular rhythm.     Heart sounds: No  murmur.  Pulmonary:     Effort: Pulmonary effort is normal. No respiratory distress.     Breath sounds: Normal breath sounds. No wheezing.  Abdominal:     Palpations: Abdomen is  soft.     Tenderness: There is no abdominal tenderness.  Musculoskeletal:        General: Normal range of motion.     Cervical back: Normal range of motion and neck supple.     Right lower leg: Edema present.     Left lower leg: Edema present.     Comments: Mild pitting edema to both lower extremities.  Skin:    General: Skin is warm and dry.     Capillary Refill: Capillary refill takes less than 2 seconds.  Neurological:     General: No focal deficit present.     Mental Status: He is alert and oriented to person, place, and time.     ED Results / Procedures / Treatments   Labs (all labs ordered are listed, but only abnormal results are displayed) Labs Reviewed  COMPREHENSIVE METABOLIC PANEL - Abnormal; Notable for the following components:      Result Value   Glucose, Bld 141 (*)    GFR calc non Af Amer 59 (*)    All other components within normal limits  CBC WITH DIFFERENTIAL/PLATELET  BRAIN NATRIURETIC PEPTIDE    EKG EKG Interpretation  Date/Time:  Sunday June 11 2019 08:18:38 EST Ventricular Rate:  89 PR Interval:    QRS Duration: 106 QT Interval:  386 QTC Calculation: 470 R Axis:   -76 Text Interpretation: Sinus rhythm Left anterior fascicular block Abnormal R-wave progression, late transition Confirmed by Fredia Sorrow [phone]) on 06/11/2019 8:38:31 AM   Radiology DG Chest Port 1 View  Result Date: 06/11/2019 CLINICAL DATA:  Shortness of breath for 3 weeks EXAM: PORTABLE CHEST 1 VIEW COMPARISON:  May 13, 2019. FINDINGS: Lungs are clear. Heart is borderline enlarged with pulmonary vascularity within normal limits. No adenopathy. No bone lesions. IMPRESSION: Borderline cardiac enlargement. No edema or consolidation. No adenopathy evident. Electronically Signed   By: Lowella Grip III M.D.   On: 06/11/2019 09:13   Results for orders placed or performed during the hospital encounter of 06/11/19  Comprehensive metabolic panel  Result Value Ref Range   Sodium 143 135 - 145 mmol/L   Potassium 3.8 3.5 - 5.1 mmol/L   Chloride 104 98 - 111 mmol/L   CO2 27 22 - 32 mmol/L   Glucose, Bld 141 (H) 70 - 99 mg/dL   BUN 23 8 - 23 mg/dL   Creatinine, Ser 1.10 0.61 - 1.24 mg/dL   Calcium 9.0 8.9 - 10.3 mg/dL   Total Protein 6.6 6.5 - 8.1 g/dL   Albumin 3.6 3.5 - 5.0 g/dL   AST 26 15 - 41 U/L   ALT 27 0 - 44 U/L   Alkaline Phosphatase 84 38 - 126 U/L   Total Bilirubin 0.7 0.3 - 1.2 mg/dL   GFR calc non Af Amer 59 (L) >60 mL/min   GFR calc Af Amer >60 >60 mL/min   Anion gap 12 5 - 15  CBC with Differential  Result Value Ref Range   WBC 6.2 4.0 - 10.5 K/uL   RBC 4.34 4.22 - 5.81 MIL/uL   Hemoglobin 13.2 13.0 - 17.0 g/dL   HCT 42.2 39.0 - 52.0 %   MCV 97.2 80.0 - 100.0 fL   MCH 30.4 26.0 - 34.0 pg   MCHC 31.3 30.0 - 36.0 g/dL   RDW 13.6 11.5 - 15.5 %   Platelets 183 150 - 400 K/uL   nRBC 0.0 0.0 - 0.2 %   Neutrophils Relative % 58 %  Neutro Abs 3.6 1.7 - 7.7 K/uL   Lymphocytes Relative 28 %   Lymphs Abs 1.7 0.7 - 4.0 K/uL   Monocytes Relative 8 %   Monocytes Absolute 0.5 0.1 - 1.0 K/uL   Eosinophils Relative 6 %   Eosinophils Absolute 0.3 0.0 - 0.5 K/uL   Basophils Relative 0 %   Basophils Absolute 0.0 0.0 - 0.1 K/uL   Immature Granulocytes 0 %   Abs Immature Granulocytes 0.02 0.00 - 0.07 K/uL  Brain natriuretic peptide  Result Value Ref Range   B Natriuretic Peptide 72.0 0.0 - 100.0 pg/mL   DG Chest Port 1 View  Result Date: 06/11/2019 CLINICAL DATA:  Shortness of breath for 3 weeks EXAM: PORTABLE CHEST 1 VIEW COMPARISON:  May 13, 2019. FINDINGS: Lungs are clear. Heart is borderline enlarged with pulmonary vascularity within normal limits. No adenopathy. No bone lesions. IMPRESSION: Borderline cardiac enlargement. No edema or consolidation. No  adenopathy evident. Electronically Signed   By: Lowella Grip III M.D.   On: 06/11/2019 09:13     Procedures Procedures (including critical care time)  Medications Ordered in ED Medications  albuterol (VENTOLIN HFA) 108 (90 Base) MCG/ACT inhaler 2 puff (2 puffs Inhalation Given 06/11/19 1027)  iohexol (OMNIPAQUE) 350 MG/ML injection 100 mL (100 mLs Intravenous Contrast Given 06/11/19 1013)  predniSONE (DELTASONE) tablet 60 mg (60 mg Oral Given 06/11/19 1310)    ED Course  I have reviewed the triage vital signs and the nursing notes.  Pertinent labs & imaging results that were available during my care of the patient were reviewed by me and considered in my medical decision making (see chart for details).    MDM Rules/Calculators/A&P                     Explanation for patient's shortness of breath not clear but may very well be exacerbation of COPD.  Chest x-ray without signs of pneumonia CT angio was done no signs of pulmonary embolus no signs of pulmonary edema no signs of pneumonia.  Patient BNP also not elevated.  Patient does have a history of's COPD as well as CHF.  This appears to be an exacerbation of the COPD.  Will treat with a course of steroids.  Patient actually fine here on no oxygen.  Oxygen sats are in the 90% range.  But he says that he gets short of breath with exertion.  Final Clinical Impression(s) / ED Diagnoses Final diagnoses:  COPD exacerbation ([place])    Rx / DC Orders ED Discharge Orders         Ordered    predniSONE (DELTASONE) 10 MG tablet  Daily     06/11/19 1307           Fredia Sorrow, MD 06/11/19 1312

## 2019-06-11 NOTE — Discharge Instructions (Addendum)
Work-up today for the shortness of breath without any significant findings.  Chest x-ray CAT scan of the chest that showed no evidence of any blood clots in the lungs or any fluid on the lungs or any signs of pneumonia.  Suspect this may be an exacerbation of chronic obstructive pulmonary disease.  Continue usual albuterol inhaler.  Start taking the prednisone as directed for the next 5 days.  Make an appointment to follow-up with your doctor.

## 2019-06-13 ENCOUNTER — Ambulatory Visit (INDEPENDENT_AMBULATORY_CARE_PROVIDER_SITE_OTHER): Payer: Medicare Other | Admitting: Family Medicine

## 2019-06-13 ENCOUNTER — Encounter: Payer: Self-pay | Admitting: Family Medicine

## 2019-06-13 VITALS — BP 123/67 | HR 84 | Ht 71.0 in | Wt 278.0 lb

## 2019-06-13 DIAGNOSIS — J449 Chronic obstructive pulmonary disease, unspecified: Secondary | ICD-10-CM

## 2019-06-13 DIAGNOSIS — F5104 Psychophysiologic insomnia: Secondary | ICD-10-CM | POA: Diagnosis not present

## 2019-06-13 DIAGNOSIS — F5101 Primary insomnia: Secondary | ICD-10-CM | POA: Diagnosis not present

## 2019-06-13 DIAGNOSIS — I878 Other specified disorders of veins: Secondary | ICD-10-CM | POA: Diagnosis not present

## 2019-06-13 DIAGNOSIS — F439 Reaction to severe stress, unspecified: Secondary | ICD-10-CM

## 2019-06-13 DIAGNOSIS — F411 Generalized anxiety disorder: Secondary | ICD-10-CM

## 2019-06-13 MED ORDER — ZOLPIDEM TARTRATE 10 MG PO TABS: ORAL_TABLET | ORAL | 3 refills | Status: DC

## 2019-06-13 NOTE — Assessment & Plan Note (Signed)
Likely panic attacks. Continue lexapro for now. Discussed working getting adequate sleep, and increased activity and exercise and work  On stress reduction.

## 2019-06-13 NOTE — Assessment & Plan Note (Signed)
Did refill medication.  Try to use sparingly.

## 2019-06-13 NOTE — Assessment & Plan Note (Signed)
I did go over the results of his spirometry from last week with him.  Really the results look fantastic he had a little bit of restriction which I explained to him today and discussed that it is likely related to his decreased activity levels as well as obesity.  Pretty much since Covid hit he has not been going to the gym 3 days a week like he was previously so we discussed trying to start to walk for 10 minutes twice a day in an area that he feels safe to walk. Finish the steroids.

## 2019-06-13 NOTE — Progress Notes (Addendum)
Established Patient Office Visit  Subjective:  Patient ID: Bryan Hatfield, male    DOB: [birth year]  Age: 86 y.o. MRN: [id number]  CC:  Chief Complaint  Patient presents with  . Hospitalization Follow-up    seen at [facility] presents for follow-up of recent emergency department visit.  Actually been experiencing some increase shortness of breath particularly with activity over the last couple of weeks and in fact we had just seen him last week to do spirometry here in the office.  The results were actually normal.  On January 9 he started feeling an increased shortness of breath even just sitting and at rest.  By the next morning he did not feel much better and so ended up calling EMS and going to the local emergency department.  He was evaluated with EKG, chest x-ray and labs.  All were normal.  BNP was normal as well indicating no sign of heart failure even though he did have some increased lower extremity swelling on the day of presentation.  Pulse ox is running in the 90s, again normal.  He was given a 5-day taper of prednisone and discharged home.  He is taking the medication daily and still has a couple day tabs left.  He is feeling a little bit better today but feels like he still a little short of breath with activity.  He was able to get some new compression stockings and just got a lower grade compression but has been wearing them and says that it has been helpful for the swelling.  He did get the thigh-high version.  Swelling in the ankles is much improved.  Follow-up insomnia-due for refill on Ambien today.  Looks like we actually had just filled this medication for him.  Past Medical History:  Diagnosis Date  . Acute respiratory failure ([place]) 09/25/11   hypoxic  . Anxiety   . Arthritis   . Bronchitis    "seems like 2x/year"  . CAD (coronary artery disease)    a. PTCA 2001. b. f/u cath 05/2012 heavy calcification, no PCI required. c. f/u cath for recurrent sx  2017: stable mod CAD unchanged from prior, mild progression of small vessel disease, elevated LVEDP.  . Colon polyps   . COPD (chronic obstructive pulmonary disease) ([place])   . Diabetes mellitus without complication ([place])   . GERD (gastroesophageal reflux disease)   . High cholesterol   . Hyperglycemia   . Hypertension   . Hypothyroidism   . Peripheral vascular disease ([place])    patient denies knowledge of, but listed in his chart  . Pleural effusion    Parapneumonic ?r/t PNA s/p thoracentesis 2008  . Pneumonia    hx  . Sleep apnea    cpap     Past Surgical History:  Procedure Laterality Date  . CARDIAC CATHETERIZATION N/A 11/25/2015   Procedure: Left Heart Cath and Coronary Angiography;  Surgeon: Leonie Man, MD;  Location: [facility];  Service: Cardiovascular;  Laterality: N/A;  . CATARACT EXTRACTION W/ INTRAOCULAR LENS  IMPLANT, BILATERAL  ~ 2006  . CHOLECYSTECTOMY N/A 10/11/2012   Procedure: LAPAROSCOPIC CHOLECYSTECTOMY WITH INTRAOPERATIVE CHOLANGIOGRAM;  Surgeon: Adin Hector, MD;  Location: [facility];  Service: General;  Laterality: N/A;  . COLONOSCOPY    . CORONARY ANGIOPLASTY  01/2000  . HEMORRHOID SURGERY  1960's  . KNEE ARTHROSCOPY Left 04/19/2017   Procedure: ARTHROSCOPY KNEE, SUBCHONDROPLASTY;  Surgeon: Dorna Leitz, MD;  Location:  [facility];  Service: Orthopedics;  Laterality: Left;  . KNEE ARTHROSCOPY WITH MEDIAL MENISECTOMY Left 04/19/2017   Procedure: KNEE ARTHROSCOPY WITH MEDIAL AND LATERAL MENISECTOMY;  Surgeon: Dorna Leitz, MD;  Location: [facility];  Service: Orthopedics;  Laterality: Left;  . LAPAROSCOPIC RIGHT COLECTOMY N/A 08/31/2017   Procedure: LAPAROSCOPIC ASSISTED ASCENDING COLECTOMY;  Surgeon: Fanny Skates, MD;  Location: [facility];  Service: General;  Laterality: N/A;    Family History  Problem Relation Age of Onset  . Stroke Mother   . Arthritis Mother   . Asthma Mother   . Emphysema Mother   . Stroke Father   . Arthritis Sister   . Stroke Sister    . Heart attack Neg Hx     Social History   Socioeconomic History  . Marital status: Married    Spouse name: Ivin Booty  . Number of children: 1  . Years of education: 10  . Highest education level: 9th grade  Occupational History  . Occupation: Retired    Comment: Surveyor, quantity - [employer]  Tobacco Use  . Smoking status: Former Smoker    Packs/day: 1.00    Years: 10.00    Pack years: 10.00    Types: Cigarettes    Quit date: 06/01/1982    Years since quitting: 37.0  . Smokeless tobacco: Never Used  . Tobacco comment: "quit smoking ~ 25 years ago; smoked ~ 1 pk/wk"  Substance and Sexual Activity  . Alcohol use: Yes    Alcohol/week: 1.0 standard drinks    Types: 1 Cans of beer per week    Comment: occasionally  . Drug use: No  . Sexual activity: Never  Other Topics Concern  . Not on file  Social History Narrative   Not exercising due to [place] was a member at the [facility]. Just had eye exam all clear.   Social Determinants of Health   Financial Resource Strain: Low Risk   . Difficulty of Paying Living Expenses: Not hard at all  Food Insecurity: No Food Insecurity  . Worried About Charity fundraiser in the Last Year: Never true  . Ran Out of Food in the Last Year: Never true  Transportation Needs: No Transportation Needs  . Lack of Transportation (Medical): No  . Lack of Transportation (Non-Medical): No  Physical Activity: Inactive  . Days of Exercise per Week: 0 days  . Minutes of Exercise per Session: 0 min  Stress: No Stress Concern Present  . Feeling of Stress : Not at all  Social Connections: Slightly Isolated  . Frequency of Communication with Friends and Family: Once a week  . Frequency of Social Gatherings with Friends and Family: Once a week  . Attends Religious Services: More than 4 times per year  . Active Member of Clubs or Organizations: Yes  . Attends Archivist Meetings: 1 to 4 times per year  . Marital Status: Married  Human resources officer  Violence: Not At Risk  . Fear of Current or Ex-Partner: No  . Emotionally Abused: No  . Physically Abused: No  . Sexually Abused: No    Outpatient Medications Prior to Visit  Medication Sig Dispense Refill  . albuterol (PROVENTIL HFA;VENTOLIN HFA) 108 (90 Base) MCG/ACT inhaler Inhale 2 puffs every 6 (six) hours as needed into the lungs for wheezing or shortness of breath.    . AMBULATORY NON FORMULARY MEDICATION Medication Name: 4 prong cane. Dx: gait instability 1 vial 0  . AMBULATORY NON FORMULARY MEDICATION Medication Name: 3  to 20 cm water pressure above the knee compression stocks.  Diagnosis chronic lower extremity edema and venous stasis. 1 vial 0  . Ascorbic Acid (VITAMIN C PO) Take 1 tablet by mouth daily.    Marland Kitchen aspirin EC 81 MG tablet Take 81 mg by mouth daily.      Marland Kitchen atorvastatin (LIPITOR) 40 MG tablet TAKE 1 TABLET BY MOUTH AT  BEDTIME 90 tablet 2  . Blood Glucose Monitoring Suppl (ONE TOUCH ULTRA 2) w/Device KIT Onetouch ultra kit. DX: E11.9 1 each 0  . cetirizine (ZYRTEC) 10 MG tablet Take 10 mg by mouth daily.    Marland Kitchen escitalopram (LEXAPRO) 10 MG tablet Take 1 tablet (10 mg total) by mouth daily. 90 tablet 1  . fluticasone (FLONASE) 50 MCG/ACT nasal spray One spray in each nostril twice a day, use left hand for right nostril, and right hand for left nostril. 48 g 3  . folic acid (FOLVITE) 1 MG tablet Take 1 mg by mouth daily.    Marland Kitchen glucose blood (ONE TOUCH ULTRA TEST) test strip For testing blood sugars twice daily.E11.9 200 each 12  . ipratropium-albuterol (DUONEB) 0.5-2.5 (3) MG/3ML SOLN USE 1 VIAL VIA NEBULIZER EVERY 2 HOURS AS NEEDED FOR WHEEZING, SHORTNESS OF BREATH. 4050 mL 3  . levothyroxine (SYNTHROID) 125 MCG tablet TAKE 1 TABLET BY MOUTH  DAILY BEFORE BREAKFAST 90 tablet 3  . metFORMIN (GLUCOPHAGE-XR) 500 MG 24 hr tablet TAKE 1 TABLET(500 MG) BY MOUTH DAILY WITH BREAKFAST 90 tablet 3  . Multiple Vitamins-Minerals (ICAPS AREDS 2) CAPS Take 1 capsule 2 (two) times daily by  mouth.    . nitroGLYCERIN (NITROSTAT) 0.4 MG SL tablet DISSOLVE 1 TABLET UNDER THE TONGUE EVERY 5 MINUTES AS  NEEDED CHEST PAIN. NOT TO  EXCEED 3 TABLETS IN 15  MINUTES 25 tablet 4  . oxybutynin (DITROPAN-XL) 10 MG 24 hr tablet Take 10 mg by mouth at bedtime.    . potassium chloride (K-DUR) 10 MEQ tablet TAKE 1 TABLET(10 MEQ) BY MOUTH TWICE DAILY 180 tablet 3  . predniSONE (DELTASONE) 10 MG tablet Take 4 tablets (40 mg total) by mouth daily. 20 tablet 0  . Tiotropium Bromide-Olodaterol (STIOLTO RESPIMAT) 2.5-2.5 MCG/ACT AERS Inhale 2 puffs into the lungs daily. 4 g 5  . torsemide (DEMADEX) 20 MG tablet Take 1 tablet (20 mg total) by mouth 2 (two) times daily. 180 tablet 1  . zolpidem (AMBIEN) 10 MG tablet TAKE 1 TABLET BY MOUTH EVERY DAY AT BEDTIME AS NEEDED FOR SLEEP 30 tablet 3   No facility-administered medications prior to visit.    No Known Allergies  ROS Review of Systems    Objective:    Physical Exam  BP 123/67   Pulse 84   Ht '5\' 11"'  (1.803 m)   Wt 278 lb (126.1 kg)   SpO2 98%   BMI 38.77 kg/m  Wt Readings from Last 3 Encounters:  06/13/19 278 lb (126.1 kg)  06/11/19 275 lb (124.7 kg)  06/08/19 275 lb (124.7 kg)     Health Maintenance Due  Topic Date Due  . OPHTHALMOLOGY EXAM  08/01/1936    There are no preventive care reminders to display for this patient.  Lab Results  Component Value Date   TSH 2.33 04/04/2019   Lab Results  Component Value Date   WBC 6.2 06/11/2019   HGB 13.2 06/11/2019   HCT 42.2 06/11/2019   MCV 97.2 06/11/2019   PLT 183 06/11/2019   Lab Results  Component Value Date  NA 143 06/11/2019   K 3.8 06/11/2019   CO2 27 06/11/2019   GLUCOSE 141 (H) 06/11/2019   BUN 23 06/11/2019   CREATININE 1.10 06/11/2019   BILITOT 0.7 06/11/2019   ALKPHOS 84 06/11/2019   AST 26 06/11/2019   ALT 27 06/11/2019   PROT 6.6 06/11/2019   ALBUMIN 3.6 06/11/2019   CALCIUM 9.0 06/11/2019   ANIONGAP 12 06/11/2019   GFR 89.45 05/29/2015   Lab  Results  Component Value Date   CHOL 150 01/25/2019   Lab Results  Component Value Date   HDL 51 01/25/2019   Lab Results  Component Value Date   LDLCALC 78 01/25/2019   Lab Results  Component Value Date   TRIG 128 01/25/2019   Lab Results  Component Value Date   CHOLHDL 2.9 01/25/2019   Lab Results  Component Value Date   HGBA1C 6.9 (A) 04/25/2019      Assessment & Plan:   Problem List Items Addressed This Visit      Respiratory   COPD (chronic obstructive pulmonary disease) ([place])    I did go over the results of his spirometry from last week with him.  Really the results look fantastic he had a little bit of restriction which I explained to him today and discussed that it is likely related to his decreased activity levels as well as obesity.  Pretty much since Covid hit he has not been going to the gym 3 days a week like he was previously so we discussed trying to start to walk for 10 minutes twice a day in an area that he feels safe to walk. Finish the steroids.          Other   Venous stasis    Lower extremity edema looks fantastic today.  Continue to wear compression stockings I think this is helping and making a big difference.      Primary insomnia    Did refill medication.  Try to use sparingly.      GAD (generalized anxiety disorder)    Likely panic attacks. Continue lexapro for now. Discussed working getting adequate sleep, and increased activity and exercise and work  On stress reduction.       Chronic insomnia - Primary    Other Visit Diagnoses    Stress at home         Stress-we discussed that I really feel like some of his shortness of breath is due to increased stress.  In fact I almost wonder if he was having a panic attack when this occurred that caused him to go to the emergency room.  He recently moved in with a new living situation and has been a little increased stress for him but he is getting used to it.  We also discussed that part of it  is probably some increased deconditioning.  Meds ordered this encounter  Medications  . zolpidem (AMBIEN) 10 MG tablet    Sig: TAKE 1 TABLET BY MOUTH EVERY DAY AT BEDTIME AS NEEDED FOR SLEEP    Dispense:  30 tablet    Refill:  3    Not to exceed 2 additional fills before 08/06/2019    Follow-up: Return in about 3 months (around 09/11/2019) for Diabetes follow-up.    Beatrice Lecher, MD

## 2019-06-13 NOTE — Assessment & Plan Note (Signed)
Lower extremity edema looks fantastic today.  Continue to wear compression stockings I think this is helping and making a big difference.

## 2019-06-13 NOTE — Progress Notes (Signed)
Pt was seen at [facility] ED on 06/11/2019 for COPD exacerbation. His CXR, CT and labs were NL. He was DC'd with a 5 day course of Prednisone 40 mg.   He is accompanied by his wife's daughter [name]

## 2019-06-16 ENCOUNTER — Telehealth: Payer: Self-pay

## 2019-06-16 NOTE — Telephone Encounter (Signed)
[name] called and left a message stating she is worried about Bryan Hatfield. She stated she was hiding in the bedroom to make the call. She also stated he would have these angry outbursts. I called [name] and scheduled Gleb for telephone visit on Monday. I stated to her that this was a telephone visit and that he didn't need to come to the office. The last telephone visit he showed up to the office. She stated she understood. She was unable to talk at the moment because Angle was in the room. She is just wanting something for his nerves. Please advise.

## 2019-06-19 ENCOUNTER — Ambulatory Visit (INDEPENDENT_AMBULATORY_CARE_PROVIDER_SITE_OTHER): Payer: Medicare Other | Admitting: Family Medicine

## 2019-06-19 ENCOUNTER — Encounter: Payer: Self-pay | Admitting: Family Medicine

## 2019-06-19 VITALS — BP 128/72

## 2019-06-19 DIAGNOSIS — F329 Major depressive disorder, single episode, unspecified: Secondary | ICD-10-CM | POA: Diagnosis not present

## 2019-06-19 DIAGNOSIS — F411 Generalized anxiety disorder: Secondary | ICD-10-CM

## 2019-06-19 MED ORDER — ESCITALOPRAM OXALATE 20 MG PO TABS: 20.0000 mg | ORAL_TABLET | Freq: Every day | ORAL | 0 refills | Status: DC

## 2019-06-19 NOTE — Progress Notes (Addendum)
Virtual Visit via Telephone Note  I connected with Bryan Hatfield on 06/19/19 at  1:20 PM EST by telephone and verified that I am speaking with the correct person using two identifiers.   I discussed the limitations, risks, security and privacy concerns of performing an evaluation and management service by telephone and the availability of in person appointments. I also discussed with the patient that there may be a patient responsible charge related to this service. The patient expressed understanding and agreed to proceed.  Patient location: At home  Provider loccation: In office   Subjective:    CC: Mood  HPI: Patient is here for telephone visit to discuss mood.  He reports since recently moving back to the [place] area he is just been little bit unsettled.  He was actually here in the office about a week ago and felt like he was actually happy about the move because it is where he had previously lived so it was back near some family and friends.  But he is currently living with his wife's family.  His wife had actually called last week stating that he was having some angry outbursts and we recommended that she schedule an appointment today.  He really did not give a lot of detail information about what was stressing him out.  He says he just feels like it is his "nerves".  He reports that he sleeping well.  He also reports he does get outside into the yard etc.  He is currently on Lexapro 10 mg and says he feels like it is working well.   Past medical history, Surgical history, Family history not pertinant except as noted below, Social history, Allergies, and medications have been entered into the medical record, reviewed, and corrections made.   Review of Systems: No fevers, chills, night sweats, weight loss, chest pain, or shortness of breath.   Objective:    General: Speaking clearly in complete sentences without any shortness of breath.  Alert and oriented x3.  Normal judgment. No  apparent acute distress.    Impression and Recommendations:    Generalized anxiety disorder with major depressive disorder-right now currently on Lexapro 10 mg.  We discussed options including possibly going up on his Lexapro to 20 mg or considering adding something to the Lexapro.  He says he would like to try that.  So we will start with increase in Lexapro first, for 30 days.  We will follow-up by phone at that point if still not helpful then we can go back down to 10 mg and then consider adding something like buspirone if needed.  Again he really just was not very verbal about any specific concerns.  And a little bit worried about his anxiety being this ramped up with the recent move especially when he otherwise told me he was happy to be moving back.  Keep an eye on the situation encouraged him to call or reach out if any point he needed any additional help       I discussed the assessment and treatment plan with the patient. The patient was provided an opportunity to ask questions and all were answered. The patient agreed with the plan and demonstrated an understanding of the instructions.   The patient was advised to call back or seek an in-person evaluation if the symptoms worsen or if the condition fails to improve as anticipated.  Time spent 18 minutes in encounter  Beatrice Lecher, MD

## 2019-06-19 NOTE — Progress Notes (Signed)
Pt reports that all of the moving and not being settled has caused him to feel this way. He feels that the Lexapro is working. Just that there has been a lot going on.

## 2019-07-05 ENCOUNTER — Other Ambulatory Visit: Payer: Self-pay | Admitting: Family Medicine

## 2019-07-05 ENCOUNTER — Telehealth: Payer: Self-pay

## 2019-07-05 NOTE — Telephone Encounter (Signed)
Bryan Hatfield would like a call back from [place]. She has a medication question.

## 2019-07-05 NOTE — Telephone Encounter (Addendum)
Spoke w/pt's wife and she wanted to know about 2 of the medications that he has on his list. She reports that his AVS stated that he has duplicate medications.   I looked over pt's medication list and advised her that the duplicate medications that she is seeing is for NON FORMULARY MEDICATIONS. For his cane and CPAP.  She voiced understanding and did not have any more questions.Elouise Munroe, [place]

## 2019-07-08 ENCOUNTER — Other Ambulatory Visit: Payer: Self-pay | Admitting: Family Medicine

## 2019-07-08 DIAGNOSIS — E1169 Type 2 diabetes mellitus with other specified complication: Secondary | ICD-10-CM

## 2019-07-23 ENCOUNTER — Ambulatory Visit: Payer: Medicare Other | Attending: Internal Medicine

## 2019-07-23 DIAGNOSIS — Z23 Encounter for immunization: Secondary | ICD-10-CM | POA: Insufficient documentation

## 2019-07-23 NOTE — Progress Notes (Signed)
   Covid-19 Vaccination Clinic  Name:  Bryan Hatfield    MRN: [id number] DOB: [birth year]  07/23/2019  [name] was observed post Covid-19 immunization for 15 minutes without incidence. He was provided with Vaccine Information Sheet and instruction to access the V-Safe system.   [name] was instructed to call 911 with any severe reactions post vaccine: Marland Kitchen Difficulty breathing  . Swelling of your face and throat  . A fast heartbeat  . A bad rash all over your body  . Dizziness and weakness    Immunizations Administered    Name Date Dose VIS Date Route   Pfizer COVID-19 Vaccine 07/23/2019  1:52 PM 0.3 mL 05/12/2019 Intramuscular   Manufacturer: [place]   Lot: [id number]   [place]: [id number]

## 2019-07-25 ENCOUNTER — Ambulatory Visit: Payer: Medicare Other | Admitting: Family Medicine

## 2019-07-26 ENCOUNTER — Other Ambulatory Visit: Payer: Self-pay

## 2019-07-26 MED ORDER — ESCITALOPRAM OXALATE 20 MG PO TABS: 20.0000 mg | ORAL_TABLET | Freq: Every day | ORAL | 0 refills | Status: DC

## 2019-07-26 NOTE — Telephone Encounter (Signed)
Al's wife called stating he needs a refill on Lexapro. I did schedule him for a follow up as per last note. Sent in 30 days.

## 2019-08-02 ENCOUNTER — Telehealth (INDEPENDENT_AMBULATORY_CARE_PROVIDER_SITE_OTHER): Payer: Medicare Other | Admitting: Family Medicine

## 2019-08-02 ENCOUNTER — Encounter: Payer: Self-pay | Admitting: Family Medicine

## 2019-08-02 DIAGNOSIS — E1169 Type 2 diabetes mellitus with other specified complication: Secondary | ICD-10-CM | POA: Diagnosis not present

## 2019-08-02 DIAGNOSIS — I5032 Chronic diastolic (congestive) heart failure: Secondary | ICD-10-CM

## 2019-08-02 DIAGNOSIS — J449 Chronic obstructive pulmonary disease, unspecified: Secondary | ICD-10-CM | POA: Diagnosis not present

## 2019-08-02 DIAGNOSIS — I10 Essential (primary) hypertension: Secondary | ICD-10-CM

## 2019-08-02 DIAGNOSIS — I110 Hypertensive heart disease with heart failure: Secondary | ICD-10-CM | POA: Diagnosis not present

## 2019-08-02 DIAGNOSIS — F5101 Primary insomnia: Secondary | ICD-10-CM | POA: Diagnosis not present

## 2019-08-02 DIAGNOSIS — E669 Obesity, unspecified: Secondary | ICD-10-CM

## 2019-08-02 DIAGNOSIS — F3341 Major depressive disorder, recurrent, in partial remission: Secondary | ICD-10-CM

## 2019-08-02 NOTE — Progress Notes (Signed)
Doing well on current regimen. He asked about refill on Ambien 10 mg and it has refills on the medication.

## 2019-08-02 NOTE — Assessment & Plan Note (Addendum)
Continue Lexapro 20 mg this seems to be working well and seems to be very effective.  PHQ-9 score of 0 today, previous of 11.  GAD-7 score of 0 today, previous of 8.  Really he has made some significant improvements.  Plan to follow back up in about 4 months.

## 2019-08-02 NOTE — Assessment & Plan Note (Signed)
Denies any recent flares or exacerbations.  No increase shortness of breath with exercise which is great.

## 2019-08-02 NOTE — Assessment & Plan Note (Addendum)
Did encourage him to keep an eye on his blood sugars especially again as he is losing weight.  He is down about 20 pounds which is fantastic.  You need to get him back in for an updated hemoglobin A1c otherwise his labs are up-to-date.  He says that his wife actually has an appointment next week so she will likely come with him and maybe at that point we can get an A1c drawn on him.

## 2019-08-02 NOTE — Assessment & Plan Note (Signed)
Sounds like home blood pressures are well controlled I did encourage him just to keep an eye on it especially with the weight loss as sometimes the blood pressure will actually go a little too low in which case we may need to back off on his medication but right now it sounds like he is doing really well.

## 2019-08-02 NOTE — Progress Notes (Signed)
Virtual Visit via Telephone Note  I connected with Bryan Hatfield on 08/02/19 at  9:50 AM EST by telephone and verified that I am speaking with the correct person using two identifiers.   I discussed the limitations, risks, security and privacy concerns of performing an evaluation and management service by telephone and the availability of in person appointments. I also discussed with the patient that there may be a patient responsible charge related to this service. The patient expressed understanding and agreed to proceed.  Patient location: At home Provider loccation: In office   Subjective:    CC: Mood  HPI: 86 year old male made phone call today to follow-up on his mood.  He had recently moved back to the [place] area which is actually where he came from before he moved to [place] but he was really just having some major problems with adjustment and was having some angry outbursts and feeling more down and irritable.  We had put him on Lexapro and then recently increased his dose to 20 mg, about 6 weeks ago.  He says he is actually really doing well on it he feels like he is finally settling in.  He is also gotten really motivated and has been trying to lose weight.  He has cut back on portion sizes and cut out bread.  He says he has ramped up his intake of fruit though.  Has been walking 2-3 times a day just going to the mailbox and back and getting out of the house a little bit and that is been helpful.  Diabetes-he says his blood sugars have been running in the 120s to 130s since he has made some dietary changes and is doing well.  Hypertension-he says he has been keeping an eye on his blood pressures and they have mostly been running in the [id number] systolic even with the weight loss.   Past medical history, Surgical history, Family history not pertinant except as noted below, Social history, Allergies, and medications have been entered into the medical record, reviewed, and  corrections made.   Review of Systems: No fevers, chills, night sweats, weight loss, chest pain, or shortness of breath.   Objective:    General: Speaking clearly in complete sentences without any shortness of breath.  Alert and oriented x3.  Normal judgment. No apparent acute distress.    Impression and Recommendations:    Essential hypertension, benign Sounds like home blood pressures are well controlled I did encourage him just to keep an eye on it especially with the weight loss as sometimes the blood pressure will actually go a little too low in which case we may need to back off on his medication but right now it sounds like he is doing really well.  Chronic diastolic heart failure (H[facility]) Denies any lower extremity swelling.  COPD (chronic obstructive pulmonary disease) ([place]) Denies any recent flares or exacerbations.  No increase shortness of breath with exercise which is great.  Diabetes mellitus type 2 in obese [facility]) Did encourage him to keep an eye on his blood sugars especially again as he is losing weight.  He is down about 20 pounds which is fantastic.  You need to get him back in for an updated hemoglobin A1c otherwise his labs are up-to-date.  He says that his wife actually has an appointment next week so she will likely come with him and maybe at that point we can get an A1c drawn on him.  Primary insomnia We will go ahead and  refill his Ambien today.  He feels like he does not have any side effects on the medication.  Depression Continue Lexapro 20 mg this seems to be working well and seems to be very effective.  PHQ-9 score of 0 today, previous of 11.  GAD-7 score of 0 today, previous of 8.  Really he has made some significant improvements.  Plan to follow back up in about 4 months.        I discussed the assessment and treatment plan with the patient. The patient was provided an opportunity to ask questions and all were answered. The patient agreed with the plan  and demonstrated an understanding of the instructions.   The patient was advised to call back or seek an in-person evaluation if the symptoms worsen or if the condition fails to improve as anticipated.  I provided 25 minutes of non-face-to-face time during this encounter.   Beatrice Lecher, MD

## 2019-08-02 NOTE — Assessment & Plan Note (Signed)
We will go ahead and refill his Ambien today.  He feels like he does not have any side effects on the medication.

## 2019-08-02 NOTE — Assessment & Plan Note (Signed)
Denies any lower extremity swelling.

## 2019-08-08 ENCOUNTER — Other Ambulatory Visit: Payer: Self-pay | Admitting: *Deleted

## 2019-08-08 DIAGNOSIS — E1169 Type 2 diabetes mellitus with other specified complication: Secondary | ICD-10-CM

## 2019-08-09 LAB — HEMOGLOBIN A1C
Hgb A1c MFr Bld: 6.6 % of total Hgb — ABNORMAL HIGH (ref <5.7)
Mean Plasma Glucose: 143 (calc)
eAG (mmol/L): 7.9 (calc)

## 2019-08-10 DIAGNOSIS — R5381 Other malaise: Secondary | ICD-10-CM | POA: Diagnosis not present

## 2019-08-10 DIAGNOSIS — W19XXXA Unspecified fall, initial encounter: Secondary | ICD-10-CM | POA: Diagnosis not present

## 2019-08-10 DIAGNOSIS — T1490XA Injury, unspecified, initial encounter: Secondary | ICD-10-CM | POA: Diagnosis not present

## 2019-08-16 ENCOUNTER — Ambulatory Visit: Payer: Medicare Other | Attending: Internal Medicine

## 2019-08-16 DIAGNOSIS — Z23 Encounter for immunization: Secondary | ICD-10-CM

## 2019-08-16 NOTE — Progress Notes (Signed)
   Covid-19 Vaccination Clinic  Name:  Bryan Hatfield    MRN: [id number] DOB: 07-17-1926  08/16/2019  [name] was observed post Covid-19 immunization for 15 minutes without incident. He was provided with Vaccine Information Sheet and instruction to access the V-Safe system.   [name] was instructed to call 911 with any severe reactions post vaccine: Marland Kitchen Difficulty breathing  . Swelling of face and throat  . A fast heartbeat  . A bad rash all over body  . Dizziness and weakness   Immunizations Administered    Name Date Dose VIS Date Route   Pfizer COVID-19 Vaccine 08/16/2019  1:01 PM 0.3 mL 05/12/2019 Intramuscular   Manufacturer: [place]   Lot: [id number]   [place]: [id number]

## 2019-09-05 ENCOUNTER — Other Ambulatory Visit: Payer: Self-pay | Admitting: Family Medicine

## 2019-09-12 ENCOUNTER — Encounter: Payer: Self-pay | Admitting: Family Medicine

## 2019-09-12 ENCOUNTER — Ambulatory Visit (INDEPENDENT_AMBULATORY_CARE_PROVIDER_SITE_OTHER): Payer: Medicare Other | Admitting: Family Medicine

## 2019-09-12 ENCOUNTER — Other Ambulatory Visit: Payer: Self-pay

## 2019-09-12 VITALS — BP 104/56 | HR 68 | Ht 71.0 in | Wt 258.0 lb

## 2019-09-12 DIAGNOSIS — E669 Obesity, unspecified: Secondary | ICD-10-CM

## 2019-09-12 DIAGNOSIS — J449 Chronic obstructive pulmonary disease, unspecified: Secondary | ICD-10-CM | POA: Diagnosis not present

## 2019-09-12 DIAGNOSIS — F3342 Major depressive disorder, recurrent, in full remission: Secondary | ICD-10-CM

## 2019-09-12 DIAGNOSIS — E1169 Type 2 diabetes mellitus with other specified complication: Secondary | ICD-10-CM

## 2019-09-12 DIAGNOSIS — I10 Essential (primary) hypertension: Secondary | ICD-10-CM

## 2019-09-12 NOTE — Assessment & Plan Note (Signed)
Last A1c looks fantastic at 6.6 he is doing a great job really congratulated him on the weight loss and regular exercise.  I will see him back in 3 months.

## 2019-09-12 NOTE — Progress Notes (Signed)
Established Patient Office Visit  Subjective:  Patient ID: Bryan Hatfield, male    DOB: 03/13/1927  Age: 86 y.o. MRN: [id number]  CC:  Chief Complaint  Patient presents with  . Hypertension  . Diabetes    HPI Bryan Hatfield presents for   Hypertension- Pt denies chest pain, SOB, dizziness, or heart palpitations.  Taking meds as directed w/o problems.  Denies medication side effects.    Diabetes - no hypoglycemic events. No wounds or sores that are not healing well. No increased thirst or urination. Checking glucose at home. Taking medications as prescribed without any side effects. [facility] to [facility].    He also has a lot of bruises on his forearms that he wanted to ask about today and wanted to know what it is and what causes it.  He says they are not painful but just notices them come up frequently.  Follow-up COPD-he actually feels like he is doing well and feels like he is breathing much better.  More recently he has been going back to the [facility] 3 days a week.   Past Medical History:  Diagnosis Date  . Acute respiratory failure ([place]) 09/25/11   hypoxic  . Anxiety   . Arthritis   . Bronchitis    "seems like 2x/year"  . CAD (coronary artery disease)    a. PTCA 2001. b. f/u cath 05/2012 heavy calcification, no PCI required. c. f/u cath for recurrent sx 2017: stable mod CAD unchanged from prior, mild progression of small vessel disease, elevated LVEDP.  . Colon polyps   . COPD (chronic obstructive pulmonary disease) ([place])   . Diabetes mellitus without complication ([place])   . GERD (gastroesophageal reflux disease)   . High cholesterol   . Hyperglycemia   . Hypertension   . Hypothyroidism   . Peripheral vascular disease ([place])    patient denies knowledge of, but listed in his chart  . Pleural effusion    Parapneumonic ?r/t PNA s/p thoracentesis 2008  . Pneumonia    hx  . Sleep apnea    cpap     Past Surgical History:   Procedure Laterality Date  . CARDIAC CATHETERIZATION N/A 11/25/2015   Procedure: Left Heart Cath and Coronary Angiography;  Surgeon: Leonie Man, MD;  Location: [facility];  Service: Cardiovascular;  Laterality: N/A;  . CATARACT EXTRACTION W/ INTRAOCULAR LENS  IMPLANT, BILATERAL  ~ 2006  . CHOLECYSTECTOMY N/A 10/11/2012   Procedure: LAPAROSCOPIC CHOLECYSTECTOMY WITH INTRAOPERATIVE CHOLANGIOGRAM;  Surgeon: Adin Hector, MD;  Location: [facility];  Service: General;  Laterality: N/A;  . COLONOSCOPY    . CORONARY ANGIOPLASTY  01/2000  . HEMORRHOID SURGERY  1960's  . KNEE ARTHROSCOPY Left 04/19/2017   Procedure: ARTHROSCOPY KNEE, SUBCHONDROPLASTY;  Surgeon: Dorna Leitz, MD;  Location: [facility];  Service: Orthopedics;  Laterality: Left;  . KNEE ARTHROSCOPY WITH MEDIAL MENISECTOMY Left 04/19/2017   Procedure: KNEE ARTHROSCOPY WITH MEDIAL AND LATERAL MENISECTOMY;  Surgeon: Dorna Leitz, MD;  Location: [facility];  Service: Orthopedics;  Laterality: Left;  . LAPAROSCOPIC RIGHT COLECTOMY N/A 08/31/2017   Procedure: LAPAROSCOPIC ASSISTED ASCENDING COLECTOMY;  Surgeon: Fanny Skates, MD;  Location: [facility];  Service: General;  Laterality: N/A;    Family History  Problem Relation Age of Onset  . Stroke Mother   . Arthritis Mother   . Asthma Mother   . Emphysema Mother   . Stroke Father   .  Arthritis Sister   . Stroke Sister   . Heart attack Neg Hx     Social History   Socioeconomic History  . Marital status: Married    Spouse name: Ivin Booty  . Number of children: 1  . Years of education: 66  . Highest education level: 9th grade  Occupational History  . Occupation: Retired    Comment: Surveyor, quantity - [employer]  Tobacco Use  . Smoking status: Former Smoker    Packs/day: 1.00    Years: 10.00    Pack years: 10.00    Types: Cigarettes    Quit date: 06/01/1982    Years since quitting: 37.3  . Smokeless tobacco: Never Used  . Tobacco comment: "quit smoking ~ 25 years ago; smoked ~ 1  pk/wk"  Substance and Sexual Activity  . Alcohol use: Yes    Alcohol/week: 1.0 standard drinks    Types: 1 Cans of beer per week    Comment: occasionally  . Drug use: No  . Sexual activity: Never  Other Topics Concern  . Not on file  Social History Narrative   Not exercising due to [place] was a member at the [facility]. Just had eye exam all clear.   Social Determinants of Health   Financial Resource Strain: Low Risk   . Difficulty of Paying Living Expenses: Not hard at all  Food Insecurity: No Food Insecurity  . Worried About Charity fundraiser in the Last Year: Never true  . Ran Out of Food in the Last Year: Never true  Transportation Needs: No Transportation Needs  . Lack of Transportation (Medical): No  . Lack of Transportation (Non-Medical): No  Physical Activity: Inactive  . Days of Exercise per Week: 0 days  . Minutes of Exercise per Session: 0 min  Stress: No Stress Concern Present  . Feeling of Stress : Not at all  Social Connections: Slightly Isolated  . Frequency of Communication with Friends and Family: Once a week  . Frequency of Social Gatherings with Friends and Family: Once a week  . Attends Religious Services: More than 4 times per year  . Active Member of Clubs or Organizations: Yes  . Attends Archivist Meetings: 1 to 4 times per year  . Marital Status: Married  Human resources officer Violence: Not At Risk  . Fear of Current or Ex-Partner: No  . Emotionally Abused: No  . Physically Abused: No  . Sexually Abused: No    Outpatient Medications Prior to Visit  Medication Sig Dispense Refill  . albuterol (PROVENTIL HFA;VENTOLIN HFA) 108 (90 Base) MCG/ACT inhaler Inhale 2 puffs every 6 (six) hours as needed into the lungs for wheezing or shortness of breath.    . AMBULATORY NON FORMULARY MEDICATION Medication Name: 4 prong cane. Dx: gait instability 1 vial 0  . AMBULATORY NON FORMULARY MEDICATION Medication Name: 15 to 20 cm water pressure above the knee  compression stocks.  Diagnosis chronic lower extremity edema and venous stasis. 1 vial 0  . Ascorbic Acid (VITAMIN C PO) Take 1 tablet by mouth daily.    Marland Kitchen aspirin EC 81 MG tablet Take 81 mg by mouth daily.      Marland Kitchen atorvastatin (LIPITOR) 40 MG tablet TAKE 1 TABLET BY MOUTH AT  BEDTIME 90 tablet 3  . Blood Glucose Monitoring Suppl (ONE TOUCH ULTRA 2) w/Device KIT Onetouch ultra kit. DX: E11.9 1 each 0  . cetirizine (ZYRTEC) 10 MG tablet Take 10 mg by mouth daily.    Marland Kitchen  escitalopram (LEXAPRO) 20 MG tablet Take 1 tablet (20 mg total) by mouth daily. 30 tablet 0  . fluticasone (FLONASE) 50 MCG/ACT nasal spray One spray in each nostril twice a day, use left hand for right nostril, and right hand for left nostril. 48 g 3  . folic acid (FOLVITE) 1 MG tablet Take 1 mg by mouth daily.    Marland Kitchen glucose blood (ONE TOUCH ULTRA TEST) test strip For testing blood sugars twice daily.E11.9 200 each 12  . ipratropium-albuterol (DUONEB) 0.5-2.5 (3) MG/3ML SOLN USE 1 VIAL VIA NEBULIZER EVERY 2 HOURS AS NEEDED FOR WHEEZING, SHORTNESS OF BREATH. 4050 mL 3  . levothyroxine (SYNTHROID) 125 MCG tablet TAKE 1 TABLET BY MOUTH  DAILY BEFORE BREAKFAST 90 tablet 3  . metFORMIN (GLUCOPHAGE XR) 500 MG 24 hr tablet TAKE 1 TABLET BY MOUTH  DAILY WITH BREAKFAST 90 tablet 3  . Multiple Vitamins-Minerals (ICAPS AREDS 2) CAPS Take 1 capsule 2 (two) times daily by mouth.    . nitroGLYCERIN (NITROSTAT) 0.4 MG SL tablet DISSOLVE 1 TABLET UNDER THE TONGUE EVERY 5 MINUTES AS  NEEDED CHEST PAIN. NOT TO  EXCEED 3 TABLETS IN 15  MINUTES 25 tablet 4  . oxybutynin (DITROPAN-XL) 10 MG 24 hr tablet Take 10 mg by mouth at bedtime.    . potassium chloride (K-DUR) 10 MEQ tablet TAKE 1 TABLET(10 MEQ) BY MOUTH TWICE DAILY 180 tablet 3  . Tiotropium Bromide-Olodaterol (STIOLTO RESPIMAT) 2.5-2.5 MCG/ACT AERS Inhale 2 puffs into the lungs daily. 4 g 5  . torsemide (DEMADEX) 20 MG tablet TAKE 1 TABLET BY MOUTH  TWICE DAILY 180 tablet 3  . zolpidem (AMBIEN) 10  MG tablet TAKE 1 TABLET BY MOUTH EVERY DAY AT BEDTIME AS NEEDED FOR SLEEP 30 tablet 3   No facility-administered medications prior to visit.    No Known Allergies  ROS Review of Systems    Objective:    Physical Exam  Constitutional: He is oriented to person, place, and time. He appears well-developed and well-nourished.  HENT:  Head: Normocephalic and atraumatic.  Eyes: Conjunctivae are normal.  Cardiovascular: Normal rate, regular rhythm and normal heart sounds.  Pulmonary/Chest: Effort normal and breath sounds normal.  Neurological: He is alert and oriented to person, place, and time.  Skin: Skin is warm and dry.  Psychiatric: He has a normal mood and affect. His behavior is normal.    BP (!) 104/56   Pulse 68   Ht '5\' 11"'  (1.803 m)   Wt 258 lb (117 kg)   SpO2 94%   BMI 35.98 kg/m  Wt Readings from Last 3 Encounters:  09/13/19 258 lb (117 kg)  08/02/19 259 lb (117.5 kg)  06/13/19 278 lb (126.1 kg)     There are no preventive care reminders to display for this patient.  There are no preventive care reminders to display for this patient.  Lab Results  Component Value Date   TSH 2.33 04/04/2019   Lab Results  Component Value Date   WBC 6.2 06/11/2019   HGB 13.2 06/11/2019   HCT 42.2 06/11/2019   MCV 97.2 06/11/2019   PLT 183 06/11/2019   Lab Results  Component Value Date   NA 143 06/11/2019   K 3.8 06/11/2019   CO2 27 06/11/2019   GLUCOSE 141 (H) 06/11/2019   BUN 23 06/11/2019   CREATININE 1.10 06/11/2019   BILITOT 0.7 06/11/2019   ALKPHOS 84 06/11/2019   AST 26 06/11/2019   ALT 27 06/11/2019   PROT 6.6  06/11/2019   ALBUMIN 3.6 06/11/2019   CALCIUM 9.0 06/11/2019   ANIONGAP 12 06/11/2019   GFR 89.45 05/29/2015   Lab Results  Component Value Date   CHOL 150 01/25/2019   Lab Results  Component Value Date   HDL 51 01/25/2019   Lab Results  Component Value Date   LDLCALC 78 01/25/2019   Lab Results  Component Value Date   TRIG 128  01/25/2019   Lab Results  Component Value Date   CHOLHDL 2.9 01/25/2019   Lab Results  Component Value Date   HGBA1C 6.6 (H) 08/08/2019      Assessment & Plan:   Problem List Items Addressed This Visit      Cardiovascular and Mediastinum   Essential hypertension, benign - Primary (Chronic)    Well controlled. Continue current regimen. Follow up in  6 months.         Respiratory   COPD (chronic obstructive pulmonary disease) (HCC)    Stable.  No recent flares or exacerbations actually feels like his low breathing a little bit better than the last time I saw him.  I think some this may even be due to his recent start of routine exercise which is probably improved his endurance.        Endocrine   Diabetes mellitus type 2 in obese (HCC)    Last A1c looks fantastic at 6.6 he is doing a great job really congratulated him on the weight loss and regular exercise.  I will see him back in 3 months.        Other   MDD (major depressive disorder), recurrent, in full remission ([place])    Overall reports that he is doing well.  He is currently on Lexapro for mood.  He says sometimes he gets down more so because of his wife.  She has more severe depression and has frequent bouts of depression to the point that she does not get out of bed for days.  He says he tries not to let that affect him but it is really difficult.  He has been trying to get outside and walk around has been try to get to the gym 3 days/week and says that really has been mentally helpful for him he feels like overall he is in a better place.         No orders of the defined types were placed in this encounter.   Follow-up: Return in about 3 months (around 12/12/2019) for Diabetes follow-up.    Beatrice Lecher, MD

## 2019-09-12 NOTE — Assessment & Plan Note (Signed)
Overall reports that he is doing well.  He is currently on Lexapro for mood.  He says sometimes he gets down more so because of his wife.  She has more severe depression and has frequent bouts of depression to the point that she does not get out of bed for days.  He says he tries not to let that affect him but it is really difficult.  He has been trying to get outside and walk around has been try to get to the gym 3 days/week and says that really has been mentally helpful for him he feels like overall he is in a better place.

## 2019-09-12 NOTE — Assessment & Plan Note (Signed)
Well controlled. Continue current regimen. Follow up in  6 months.  

## 2019-09-13 ENCOUNTER — Encounter: Payer: Self-pay | Admitting: Family Medicine

## 2019-09-15 NOTE — Assessment & Plan Note (Signed)
Stable.  No recent flares or exacerbations actually feels like his low breathing a little bit better than the last time I saw him.  I think some this may even be due to his recent start of routine exercise which is probably improved his endurance.

## 2019-09-20 IMAGING — MR MR KNEE*L* W/O CM
6 series · 39 of 40 positions shown · non-contrast
Comparison: Plain films of the knees 03/01/2017.

CLINICAL DATA: Chronic left knee pain and locking. Osteoarthritis.
No known injury.

EXAM:
MRI OF THE LEFT KNEE WITHOUT CONTRAST
TECHNIQUE: Multiplanar, multisequence MR imaging of the knee was performed. No
intravenous contrast was administered.

[Series 3: PD fat-sat · axial · 3.0mm · 0.35mm/px · z∈[-61,+55]mm · 8 of 36 slices shown (1 of 4)]
[im 1/36]
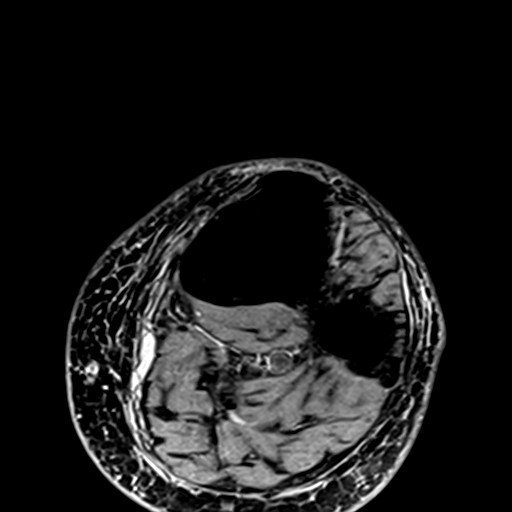
[im 6/36]
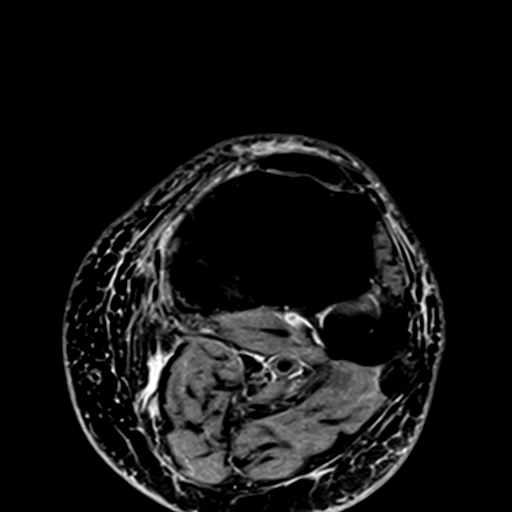
[im 11/36]
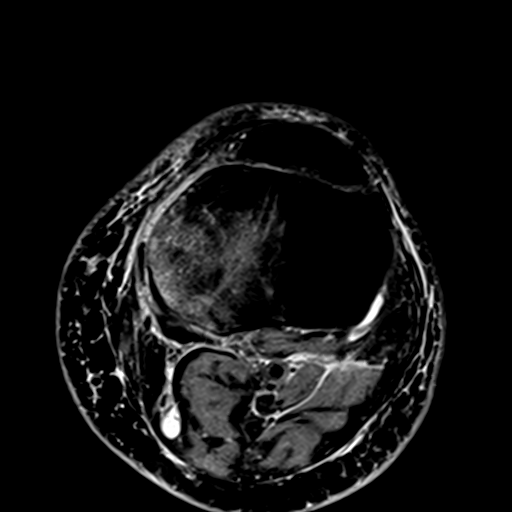
[im 16/36]
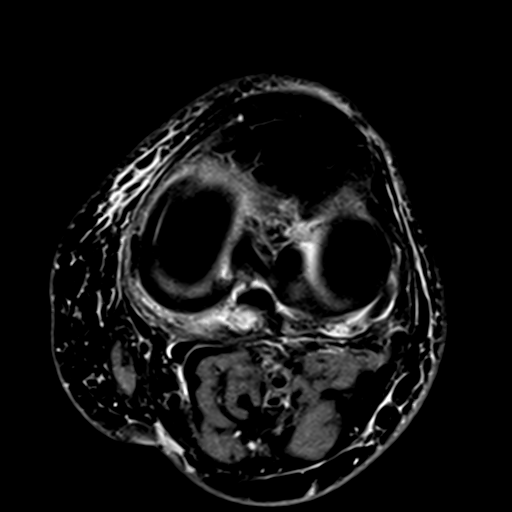
[im 21/36]
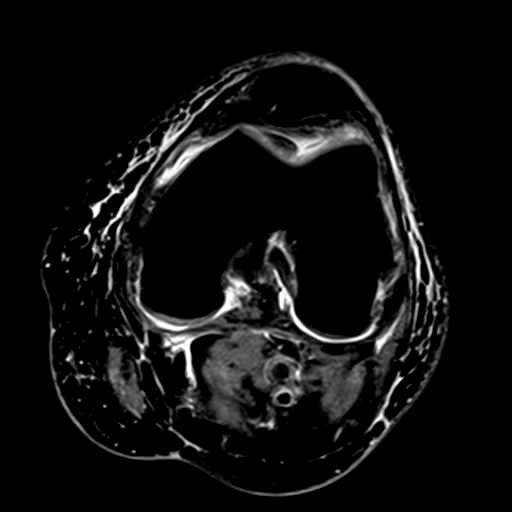
[im 26/36]
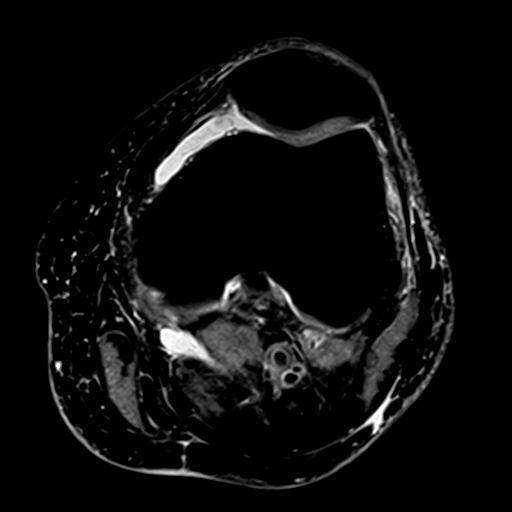
[im 31/36]
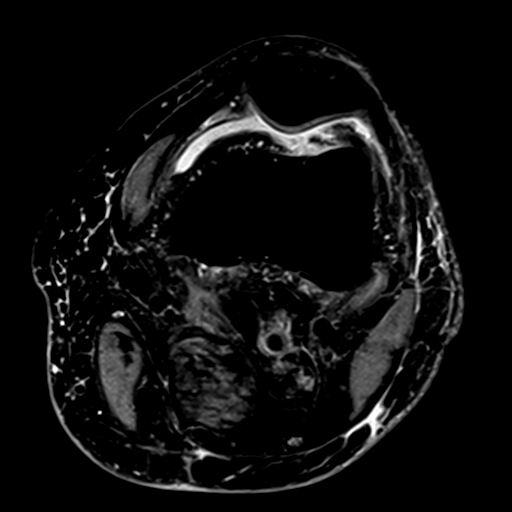
[im 36/36]
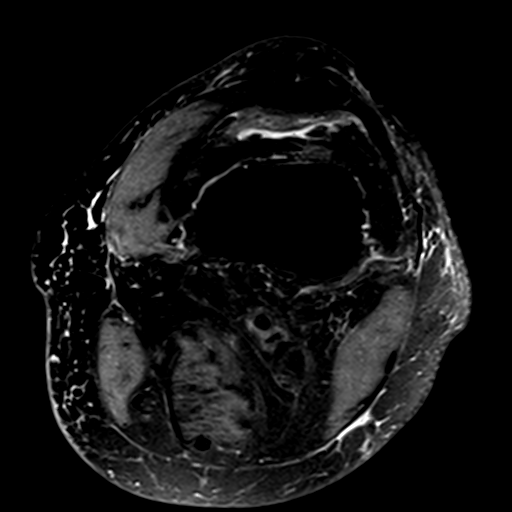

[Series 4: T1 · coronal · 3.0mm · 0.53mm/px · 6 of 37 slices shown]
[im 1/37]
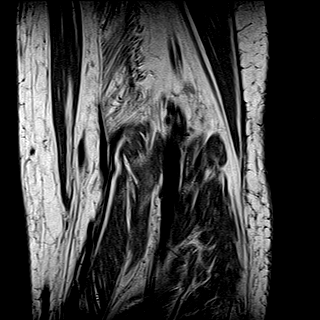
[im 7/37]
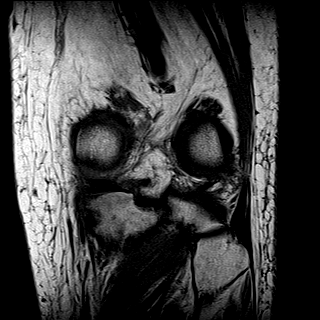
[im 13/37]
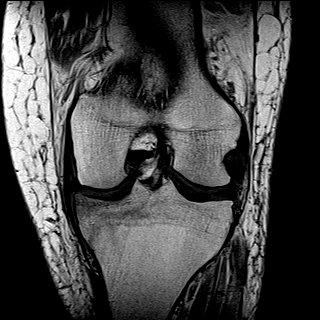
[im 19/37]
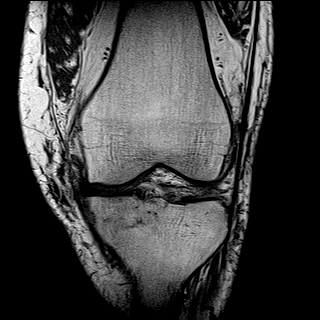
[im 25/37]
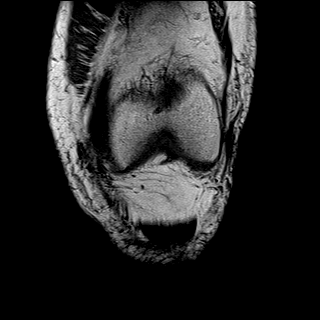
[im 31/37]
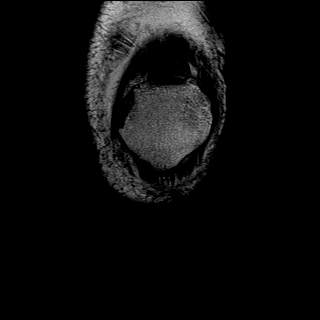

[Series 5: T2 fat-sat · coronal · 3.0mm · 0.53mm/px · 7 of 37 slices shown]
[im 1/37]
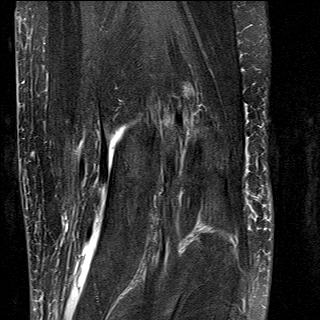
[im 7/37]
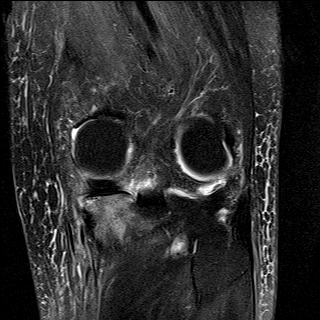
[im 13/37]
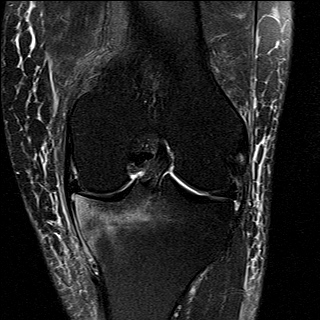
[im 19/37]
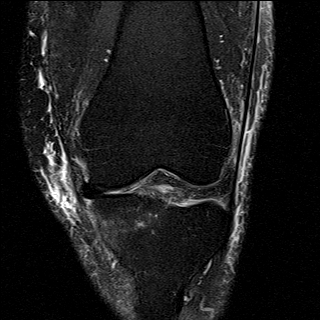
[im 25/37]
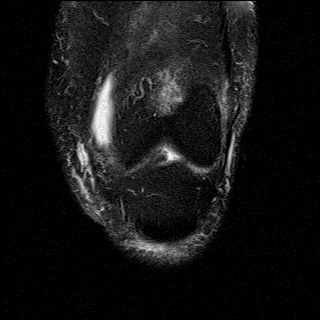
[im 31/37]
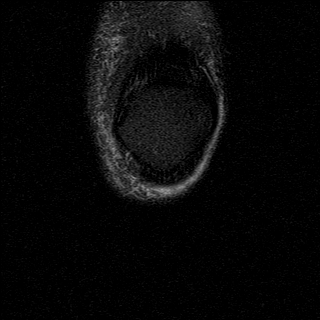
[im 37/37]
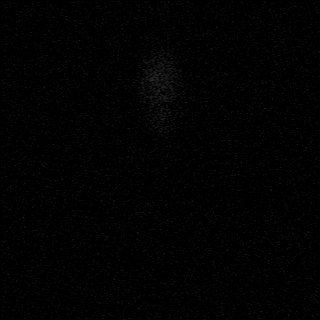

[Series 6: PD fat-sat · coronal · 3.0mm · 0.66mm/px · 7 of 37 slices shown (2 of 4)]
[im 1/37]
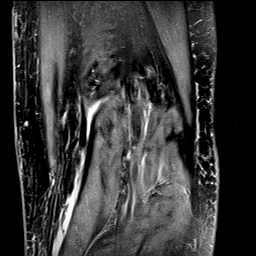
[im 7/37]
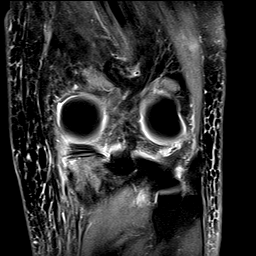
[im 13/37]
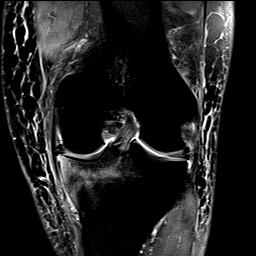
[im 19/37]
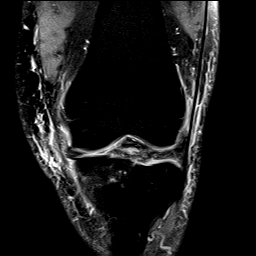
[im 25/37]
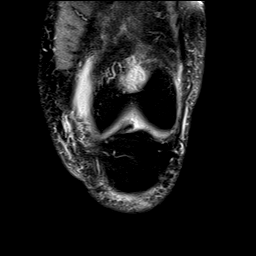
[im 31/37]
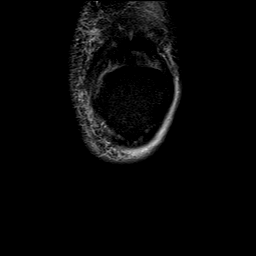
[im 37/37]
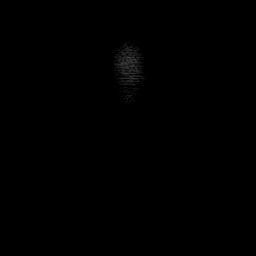

[Series 7: PD fat-sat · sagittal · 3.0mm · 0.66mm/px · 7 of 38 slices shown (3 of 4)]
[im 1/38]
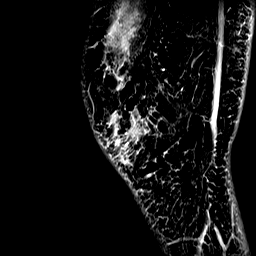
[im 7/38]
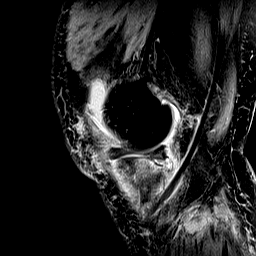
[im 13/38]
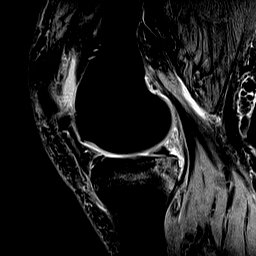
[im 19/38]
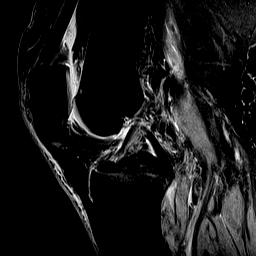
[im 25/38]
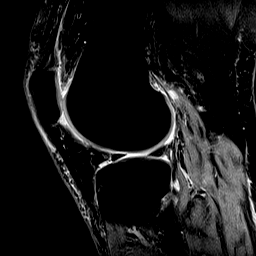
[im 31/38]
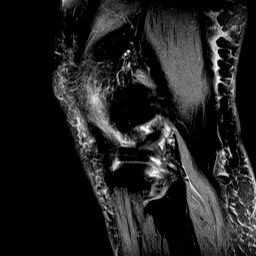
[im 38/38]
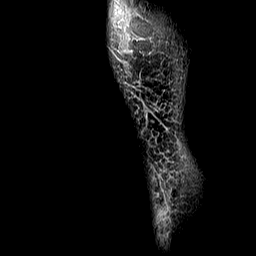

[Series 8: PD fat-sat · coronal · 2.0mm · 0.62mm/px · 4 of 19 slices shown (4 of 4)]
[im 1/19]
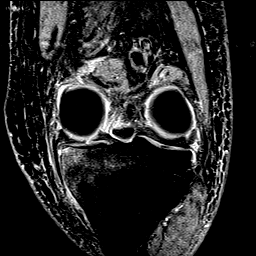
[im 7/19]
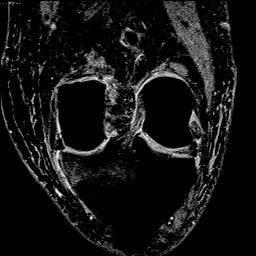
[im 13/19]
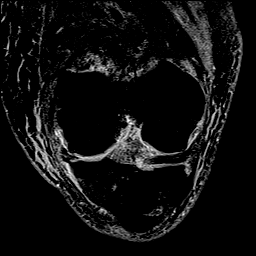
[im 19/19]
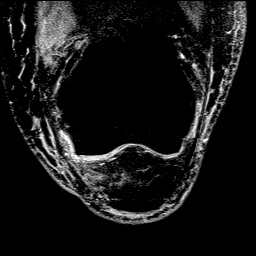

[39 of 40 positions shown; findings below may reference images not displayed]

FINDINGS: MENISCI

Medial meniscus: There is a radial tear along the free edge of the
central posterior horn. A horizontal tear reaching the meniscal
undersurface is seen in the posterior horn and body. Marked fraying
along the free edge of the body is identified.

Lateral meniscus: Horizontal tear in the mid and anterior body
reaching the femoral articular surface is identified.

LIGAMENTS

Cruciates:  Intact.

Collaterals:  Intact.

CARTILAGE

Patellofemoral:  Preserved.

Medial:  Thinned without focal defect.

Lateral:  Mildly degenerated.

Joint:  Small effusion.

Popliteal Fossa: Small Baker's cyst measures 2.4 cm transverse by
0.8 cm AP by 6.5 cm craniocaudal.

Extensor Mechanism:  Intact.

Bones: Marked marrow edema is present in the medial tibial plateau.
There is a small and nondisplaced subchondral fracture in the
posterior, medial corner of the medial tibial plateau.

Other: None.
IMPRESSION: Findings consistent with an acute or subacute nondisplaced
subchondral insufficiency fracture in the posterior corner of the
medial tibial plateau with associated marrow edema.

Tears of both the medial and lateral menisci as described above.

Mild to moderate osteoarthritis most notable in the medial
compartment.

## 2019-10-04 ENCOUNTER — Other Ambulatory Visit: Payer: Self-pay | Admitting: *Deleted

## 2019-10-04 MED ORDER — ZOLPIDEM TARTRATE 10 MG PO TABS: ORAL_TABLET | ORAL | 3 refills | Status: DC

## 2019-10-18 ENCOUNTER — Other Ambulatory Visit: Payer: Self-pay

## 2019-10-18 MED ORDER — POTASSIUM CHLORIDE ER 10 MEQ PO TBCR: EXTENDED_RELEASE_TABLET | ORAL | 3 refills | Status: AC

## 2019-10-18 NOTE — Telephone Encounter (Signed)
Attempted to call patient regarding med refills. No answer. Unable to leave a vm msg. No option.

## 2019-10-18 NOTE — Telephone Encounter (Signed)
Carter Kitten D, CMA routed conversation to Loren Racer, RN; [facility] [address] Triage 3 minutes ago (10:41 AM)  Carter Kitten D, CMA 4 minutes ago (10:40 AM)     Pt's wife calling requesting a refill on nitroglycerin. This medication was prescribed by pt's PCP. Would Dr. [name] like to refill this medication? Please address

## 2019-10-18 NOTE — Telephone Encounter (Signed)
Pt's wife calling requesting a refill on nitroglycerin. This medication was prescribed by pt's PCP. Would Dr. [name] like to refill this medication? Please address

## 2019-10-18 NOTE — Telephone Encounter (Signed)
Pt's wife called on behalf of pt. Requesting med refill for Potassium chloride. Written by external provider. Pls send refill to [facility] pharmacy. Rx pended.

## 2019-10-20 ENCOUNTER — Other Ambulatory Visit: Payer: Self-pay

## 2019-10-20 MED ORDER — NITROGLYCERIN 0.4 MG SL SUBL: SUBLINGUAL_TABLET | SUBLINGUAL | 0 refills | Status: AC

## 2019-11-03 ENCOUNTER — Telehealth: Payer: Self-pay | Admitting: Family Medicine

## 2019-11-03 MED ORDER — ESCITALOPRAM OXALATE 20 MG PO TABS: 20.0000 mg | ORAL_TABLET | Freq: Every day | ORAL | 1 refills | Status: AC

## 2019-11-03 NOTE — Telephone Encounter (Signed)
Patient needs Lexapro refill sent to [facility] in [place].   escitalopram (LEXAPRO) 20 MG tablet [[id number]]   Order Details Dose: 20 mg Route: Oral Frequency: Daily  Dispense Quantity: 30 tablet Refills: 0       Sig: Take 1 tablet (20 mg total) by mouth daily.      Start Date: 07/26/19 End Date: --  Written Date: 07/26/19 Expiration Date: 07/25/20  Original Order:  escitalopram (LEXAPRO) 20 MG tablet [[id number]]  Providers  Ordering and Authorizing Provider: [name], MD NPI: [id number]  [place] #: [id number]  Ordering User:  Narda Rutherford, [facility] [phone] - [place], [place] [address]  [address], [place] [address]  Phone:  [phone] Fax:  [phone]  [place] #:  [id number]

## 2019-12-07 NOTE — Progress Notes (Signed)
Cardiology Office Note:    Date:  12/08/2019   ID:  Bryan Hatfield, DOB April 24, 1927, MRN [id number]  PCP:  [name], MD  Cardiologist:  Sinclair Grooms, MD   Referring MD: [name], *   Chief Complaint  Patient presents with  . Congestive Heart Failure    History of Present Illness:    Bryan Hatfield is a 86 y.o. male with a hx of CAD (s/p PTCA 2001 with moderate disease by cath 2017), HTN, HLD, hyperglycemia with A1C 7.5 in 06/2017 (previously pre-diabetic by A1Cs), COPD, OSA on CPAP, parapneumonic pleural effusion r/t PNA s/p thoracentesis 2008, h/o asbestos exposure, remote tobacco abuse (only 5 year history of smoking), probable chronic diastolic CHF improved on torsemide.  Improved since adding torsemide.  He has lost 29 pounds since I last saw him.  Breathing is improved.  He denies angina.  He has not had syncope.  No palpitations or other cardiac complaints.  Past Medical History:  Diagnosis Date  . Acute respiratory failure ([place]) 09/25/11   hypoxic  . Anxiety   . Arthritis   . Bronchitis    "seems like 2x/year"  . CAD (coronary artery disease)    a. PTCA 2001. b. f/u cath 05/2012 heavy calcification, no PCI required. c. f/u cath for recurrent sx 2017: stable mod CAD unchanged from prior, mild progression of small vessel disease, elevated LVEDP.  . Colon polyps   . COPD (chronic obstructive pulmonary disease) ([place])   . Diabetes mellitus without complication ([place])   . GERD (gastroesophageal reflux disease)   . High cholesterol   . Hyperglycemia   . Hypertension   . Hypothyroidism   . Peripheral vascular disease ([place])    patient denies knowledge of, but listed in his chart  . Pleural effusion    Parapneumonic ?r/t PNA s/p thoracentesis 2008  . Pneumonia    hx  . Sleep apnea    cpap     Past Surgical History:  Procedure Laterality Date  . CARDIAC CATHETERIZATION N/A 11/25/2015   Procedure: Left Heart Cath and Coronary Angiography;  Surgeon:  Leonie Man, MD;  Location: [facility];  Service: Cardiovascular;  Laterality: N/A;  . CATARACT EXTRACTION W/ INTRAOCULAR LENS  IMPLANT, BILATERAL  ~ 2006  . CHOLECYSTECTOMY N/A 10/11/2012   Procedure: LAPAROSCOPIC CHOLECYSTECTOMY WITH INTRAOPERATIVE CHOLANGIOGRAM;  Surgeon: Adin Hector, MD;  Location: [place] [facility];  Service: General;  Laterality: N/A;  . COLONOSCOPY    . CORONARY ANGIOPLASTY  01/2000  . HEMORRHOID SURGERY  1960's  . KNEE ARTHROSCOPY Left 04/19/2017   Procedure: ARTHROSCOPY KNEE, SUBCHONDROPLASTY;  Surgeon: Dorna Leitz, MD;  Location: [facility];  Service: Orthopedics;  Laterality: Left;  . KNEE ARTHROSCOPY WITH MEDIAL MENISECTOMY Left 04/19/2017   Procedure: KNEE ARTHROSCOPY WITH MEDIAL AND LATERAL MENISECTOMY;  Surgeon: Dorna Leitz, MD;  Location: [place];  Service: Orthopedics;  Laterality: Left;  . LAPAROSCOPIC RIGHT COLECTOMY N/A 08/31/2017   Procedure: LAPAROSCOPIC ASSISTED ASCENDING COLECTOMY;  Surgeon: Fanny Skates, MD;  Location: [place];  Service: General;  Laterality: N/A;    Current Medications: Current Meds  Medication Sig  . albuterol (PROVENTIL HFA;VENTOLIN HFA) 108 (90 Base) MCG/ACT inhaler Inhale 2 puffs every 6 (six) hours as needed into the lungs for wheezing or shortness of breath.  . AMBULATORY NON FORMULARY MEDICATION Medication Name: 4 prong cane. Dx: gait instability  . AMBULATORY NON FORMULARY MEDICATION Medication Name: 15 to 20 cm water pressure above the knee  compression stocks.  Diagnosis chronic lower extremity edema and venous stasis.  . Ascorbic Acid (VITAMIN C PO) Take 1 tablet by mouth daily.  Marland Kitchen aspirin EC 81 MG tablet Take 81 mg by mouth daily.    Marland Kitchen atorvastatin (LIPITOR) 40 MG tablet TAKE 1 TABLET BY MOUTH AT  BEDTIME  . Blood Glucose Monitoring Suppl (ONE TOUCH ULTRA 2) w/Device KIT Onetouch ultra kit. DX: E11.9  . cetirizine (ZYRTEC) 10 MG tablet Take 10 mg by mouth daily.  Marland Kitchen escitalopram (LEXAPRO) 20 MG tablet Take 1 tablet (20 mg  total) by mouth daily.  . fluticasone (FLONASE) 50 MCG/ACT nasal spray One spray in each nostril twice a day, use left hand for right nostril, and right hand for left nostril.  . folic acid (FOLVITE) 1 MG tablet Take 1 mg by mouth daily.  Marland Kitchen glucose blood (ONE TOUCH ULTRA TEST) test strip For testing blood sugars twice daily.E11.9  . ipratropium-albuterol (DUONEB) 0.5-2.5 (3) MG/3ML SOLN USE 1 VIAL VIA NEBULIZER EVERY 2 HOURS AS NEEDED FOR WHEEZING, SHORTNESS OF BREATH.  Marland Kitchen levothyroxine (SYNTHROID) 125 MCG tablet TAKE 1 TABLET BY MOUTH  DAILY BEFORE BREAKFAST  . metFORMIN (GLUCOPHAGE XR) 500 MG 24 hr tablet TAKE 1 TABLET BY MOUTH  DAILY WITH BREAKFAST  . Multiple Vitamins-Minerals (ICAPS AREDS 2) CAPS Take 1 capsule 2 (two) times daily by mouth.  . nitroGLYCERIN (NITROSTAT) 0.4 MG SL tablet DISSOLVE 1 TABLET UNDER THE TONGUE EVERY 5 MINUTES AS  NEEDED CHEST PAIN. NOT TO  EXCEED 3 TABLETS IN 15  MINUTES  . oxybutynin (DITROPAN-XL) 10 MG 24 hr tablet Take 10 mg by mouth at bedtime.  . potassium chloride (KLOR-CON) 10 MEQ tablet TAKE 1 TABLET(10 MEQ) BY MOUTH TWICE DAILY  . Tiotropium Bromide-Olodaterol (STIOLTO RESPIMAT) 2.5-2.5 MCG/ACT AERS Inhale 2 puffs into the lungs daily.  Marland Kitchen torsemide (DEMADEX) 20 MG tablet TAKE 1 TABLET BY MOUTH  TWICE DAILY  . zolpidem (AMBIEN) 10 MG tablet TAKE 1 TABLET BY MOUTH EVERY DAY AT BEDTIME AS NEEDED FOR SLEEP     Allergies:   Patient has no known allergies.   Social History   Socioeconomic History  . Marital status: Married    Spouse name: Bryan Hatfield  . Number of children: 1  . Years of education: 29  . Highest education level: 9th grade  Occupational History  . Occupation: Retired    Comment: Systems developer - [employer]  Tobacco Use  . Smoking status: Former Smoker    Packs/day: 1.00    Years: 10.00    Pack years: 10.00    Types: Cigarettes    Quit date: 06/01/1982    Years since quitting: 37.5  . Smokeless tobacco: Never Used  . Tobacco comment:  "quit smoking ~ 25 years ago; smoked ~ 1 pk/wk"  Vaping Use  . Vaping Use: Never used  Substance and Sexual Activity  . Alcohol use: Yes    Alcohol/week: 1.0 standard drink    Types: 1 Cans of beer per week    Comment: occasionally  . Drug use: No  . Sexual activity: Never  Other Topics Concern  . Not on file  Social History Narrative   Not exercising due to COVID was a member at the [facility]. Just had eye exam all clear.   Social Determinants of Health   Financial Resource Strain: Low Risk   . Difficulty of Paying Living Expenses: Not hard at all  Food Insecurity: No Food Insecurity  . Worried About [employer] of  Food in the Last Year: Never true  . Ran Out of Food in the Last Year: Never true  Transportation Needs: No Transportation Needs  . Lack of Transportation (Medical): No  . Lack of Transportation (Non-Medical): No  Physical Activity: Inactive  . Days of Exercise per Week: 0 days  . Minutes of Exercise per Session: 0 min  Stress: No Stress Concern Present  . Feeling of Stress : Not at all  Social Connections: Moderately Integrated  . Frequency of Communication with Friends and Family: Once a week  . Frequency of Social Gatherings with Friends and Family: Once a week  . Attends Religious Services: More than 4 times per year  . Active Member of Clubs or Organizations: Yes  . Attends Banker Meetings: 1 to 4 times per year  . Marital Status: Married     Family History: The patient's family history includes Arthritis in his mother and sister; Asthma in his mother; Emphysema in his mother; Stroke in his father, mother, and sister. There is no history of Heart attack.  ROS:   Please see the history of present illness.    No edema.  No angina.  All other systems reviewed and are negative.  EKGs/Labs/Other Studies Reviewed:    The following studies were reviewed today: No new data.  EKG:  EKG not repeated  Recent Labs: 04/04/2019: TSH 2.33 06/11/2019:  ALT 27; B Natriuretic Peptide 72.0; BUN 23; Creatinine, Ser 1.10; Hemoglobin 13.2; Platelets 183; Potassium 3.8; Sodium 143  Recent Lipid Panel    Component Value Date/Time   CHOL 150 01/25/2019 0930   TRIG 128 01/25/2019 0930   HDL 51 01/25/2019 0930   CHOLHDL 2.9 01/25/2019 0930   VLDL 17 09/05/2014 0837   LDLCALC 78 01/25/2019 0930    Physical Exam:    VS:  BP 124/60   Pulse 68   Ht 5\' 11"  (1.803 m)   Wt 252 lb 12.8 oz (114.7 kg)   SpO2 96%   BMI 35.26 kg/m     Wt Readings from Last 3 Encounters:  12/08/19 252 lb 12.8 oz (114.7 kg)  09/13/19 258 lb (117 kg)  08/02/19 259 lb (117.5 kg)     GEN: Appears younger than stated age. No acute distress HEENT: Normal NECK: No JVD. LYMPHATICS: No lymphadenopathy CARDIAC:  RRR without murmur, gallop, or edema. VASCULAR:  Normal Pulses. No bruits. RESPIRATORY:  Clear to auscultation without rales, wheezing or rhonchi  ABDOMEN: Soft, non-tender, non-distended, No pulsatile mass, MUSCULOSKELETAL: No deformity  SKIN: Warm and dry NEUROLOGIC:  Alert and oriented x 3 PSYCHIATRIC:  Normal affect   ASSESSMENT:    1. Coronary artery disease involving native coronary artery of native heart with angina pectoris (HCC)   2. Chronic diastolic CHF (congestive heart failure) (HCC)   3. Essential hypertension, benign   4. Hyperlipidemia, unspecified hyperlipidemia type   5. Educated about COVID-19 virus infection    PLAN:    In order of problems listed above:  1. No anginal symptoms.  Aerobic activity encouraged. 2. Diastolic heart failure is improved after starting Demadex.  Last laboratory data was unremarkable.  He needs basic metabolic panel done twice per year on Demadex and potassium. 3. Blood pressure control is excellent on today's labs.  Continue Demadex 20 mg twice daily Klor-Con 10 mEq twice daily.  Be met at next office visit if not done by primary care.  January potassium was 3.8. 4. Continue Lipitor 40 mg/day.  Last LDL  was  78 August 2020.  Has upcoming appointment with Dr. Trixie Rude where he feels blood work will be performed. 5. Has been vaccinated.  Did not get ill during the pandemic.   Medication Adjustments/Labs and Tests Ordered: Current medicines are reviewed at length with the patient today.  Concerns regarding medicines are outlined above.  No orders of the defined types were placed in this encounter.  No orders of the defined types were placed in this encounter.   Patient Instructions  Medication Instructions:  Your physician recommends that you continue on your current medications as directed. Please refer to the Current Medication list given to you today.  *If you need a refill on your cardiac medications before your next appointment, please call your pharmacy*   Lab Work: None If you have labs (blood work) drawn today and your tests are completely normal, you will receive your results only by: Marland Kitchen MyChart Message (if you have MyChart) OR . A paper copy in the mail If you have any lab test that is abnormal or we need to change your treatment, we will call you to review the results.   Testing/Procedures: None   Follow-Up: At [facility], you and your health needs are our priority.  As part of our continuing mission to provide you with exceptional heart care, we have created designated Provider Care Teams.  These Care Teams include your primary Cardiologist (physician) and Advanced Practice Providers (APPs -  Physician Assistants and Nurse Practitioners) who all work together to provide you with the care you need, when you need it.  We recommend signing up for the patient portal called "MyChart".  Sign up information is provided on this After Visit Summary.  MyChart is used to connect with patients for Virtual Visits (Telemedicine).  Patients are able to view lab/test results, encounter notes, upcoming appointments, etc.  Non-urgent messages can be sent to your provider as well.   To  learn more about what you can do with MyChart, go to [email or web address].    Your next appointment:   6 month(s)  The format for your next appointment:   In Person  Provider:   You may see Sinclair Grooms, MD or one of the following Advanced Practice Providers on your designated Care Team:    Truitt Merle, NP  Cecilie Kicks, NP  Kathyrn Drown, NP    Other Instructions      Signed, Sinclair Grooms, MD  12/08/2019 5:04 PM    [place]

## 2019-12-08 ENCOUNTER — Other Ambulatory Visit: Payer: Self-pay

## 2019-12-08 ENCOUNTER — Encounter: Payer: Self-pay | Admitting: Interventional Cardiology

## 2019-12-08 ENCOUNTER — Ambulatory Visit: Payer: Medicare Other | Admitting: Interventional Cardiology

## 2019-12-08 VITALS — BP 124/60 | HR 68 | Ht 71.0 in | Wt 252.8 lb

## 2019-12-08 DIAGNOSIS — E785 Hyperlipidemia, unspecified: Secondary | ICD-10-CM

## 2019-12-08 DIAGNOSIS — I25119 Atherosclerotic heart disease of native coronary artery with unspecified angina pectoris: Secondary | ICD-10-CM

## 2019-12-08 DIAGNOSIS — Z7189 Other specified counseling: Secondary | ICD-10-CM

## 2019-12-08 DIAGNOSIS — I5032 Chronic diastolic (congestive) heart failure: Secondary | ICD-10-CM | POA: Diagnosis not present

## 2019-12-08 DIAGNOSIS — I10 Essential (primary) hypertension: Secondary | ICD-10-CM

## 2019-12-08 NOTE — Patient Instructions (Signed)

## 2019-12-12 ENCOUNTER — Ambulatory Visit: Payer: Medicare Other | Admitting: Family Medicine

## 2019-12-14 ENCOUNTER — Encounter: Payer: Self-pay | Admitting: Family Medicine

## 2019-12-14 ENCOUNTER — Other Ambulatory Visit: Payer: Self-pay

## 2019-12-14 ENCOUNTER — Ambulatory Visit (INDEPENDENT_AMBULATORY_CARE_PROVIDER_SITE_OTHER): Payer: Medicare Other | Admitting: Family Medicine

## 2019-12-14 VITALS — BP 112/61 | HR 73 | Ht 71.0 in | Wt 252.0 lb

## 2019-12-14 DIAGNOSIS — K625 Hemorrhage of anus and rectum: Secondary | ICD-10-CM

## 2019-12-14 DIAGNOSIS — I10 Essential (primary) hypertension: Secondary | ICD-10-CM | POA: Diagnosis not present

## 2019-12-14 DIAGNOSIS — E669 Obesity, unspecified: Secondary | ICD-10-CM

## 2019-12-14 DIAGNOSIS — E1169 Type 2 diabetes mellitus with other specified complication: Secondary | ICD-10-CM | POA: Diagnosis not present

## 2019-12-14 DIAGNOSIS — B356 Tinea cruris: Secondary | ICD-10-CM

## 2019-12-14 DIAGNOSIS — F5101 Primary insomnia: Secondary | ICD-10-CM

## 2019-12-14 LAB — POCT GLYCOSYLATED HEMOGLOBIN (HGB A1C): Hemoglobin A1C: 6 % — AB (ref 4.0–5.6)

## 2019-12-14 MED ORDER — NYSTATIN 100000 UNIT/GM EX CREA: 1.0000 "application " | TOPICAL_CREAM | Freq: Two times a day (BID) | CUTANEOUS | 1 refills | Status: DC

## 2019-12-14 MED ORDER — ZOLPIDEM TARTRATE 10 MG PO TABS: ORAL_TABLET | ORAL | 0 refills | Status: DC

## 2019-12-14 NOTE — Assessment & Plan Note (Signed)
A1c looks phenomenal today at 6.0.  Improved from last time.  Keep up the great work.  He has been going to the [facility] 3 days a week consistently he has a workout partner.

## 2019-12-14 NOTE — Assessment & Plan Note (Signed)
We will try to send 90-day supply to mail order but explained to him that often times Medicare does not cover this and sometimes they will not pay for 90-day supply because it is a scheduled drug.  Of but I will be happy to try to send it through to [facility] to see if I will fill it.

## 2019-12-14 NOTE — Progress Notes (Signed)
Established Patient Office Visit  Subjective:  Patient ID: Bryan Hatfield, male    DOB: 1926/12/25  Age: 86 y.o. MRN: [id number]  CC:  Chief Complaint  Patient presents with  . Diabetes  . Hypertension    HPI   [name] presents for   Diabetes - no hypoglycemic events. No wounds or sores that are not healing well. No increased thirst or urination. Checking glucose at home. Taking medications as prescribed without any side effects. He has lost 6 more lbs since last here.    Hypertension- Pt denies chest pain, SOB, dizziness, or heart palpitations.  Taking meds as directed w/o problems.  Denies medication side effects.  Denies any lower extremity swelling.  Does report some itching in the groin.  He says it seems to come and go he tried using an over-the-counter cream that his son-in-law gave him and says it did seem to help.  He has not noticed any redness, rash or peeling skin.  Follow-up insomnia-he would really like to have his Ambien changed to a 90-day supply and sent to mail order.    Past Medical History:  Diagnosis Date  . Acute respiratory failure ([place]) 09/25/11   hypoxic  . Anxiety   . Arthritis   . Bronchitis    "seems like 2x/year"  . CAD (coronary artery disease)    a. PTCA 2001. b. f/u cath 05/2012 heavy calcification, no PCI required. c. f/u cath for recurrent sx 2017: stable mod CAD unchanged from prior, mild progression of small vessel disease, elevated LVEDP.  . Colon polyps   . COPD (chronic obstructive pulmonary disease) ([place])   . Diabetes mellitus without complication ([place])   . GERD (gastroesophageal reflux disease)   . High cholesterol   . Hyperglycemia   . Hypertension   . Hypothyroidism   . Peripheral vascular disease ([place])    patient denies knowledge of, but listed in his chart  . Pleural effusion    Parapneumonic ?r/t PNA s/p thoracentesis 2008  . Pneumonia    hx  . Sleep apnea    cpap     Past Surgical History:  Procedure Laterality  Date  . CARDIAC CATHETERIZATION N/A 11/25/2015   Procedure: Left Heart Cath and Coronary Angiography;  Surgeon: Leonie Man, MD;  Location: [facility];  Service: Cardiovascular;  Laterality: N/A;  . CATARACT EXTRACTION W/ INTRAOCULAR LENS  IMPLANT, BILATERAL  ~ 2006  . CHOLECYSTECTOMY N/A 10/11/2012   Procedure: LAPAROSCOPIC CHOLECYSTECTOMY WITH INTRAOPERATIVE CHOLANGIOGRAM;  Surgeon: Adin Hector, MD;  Location: [facility];  Service: General;  Laterality: N/A;  . COLONOSCOPY    . CORONARY ANGIOPLASTY  01/2000  . HEMORRHOID SURGERY  1960's  . KNEE ARTHROSCOPY Left 04/19/2017   Procedure: ARTHROSCOPY KNEE, SUBCHONDROPLASTY;  Surgeon: Dorna Leitz, MD;  Location: [facility];  Service: Orthopedics;  Laterality: Left;  . KNEE ARTHROSCOPY WITH MEDIAL MENISECTOMY Left 04/19/2017   Procedure: KNEE ARTHROSCOPY WITH MEDIAL AND LATERAL MENISECTOMY;  Surgeon: Dorna Leitz, MD;  Location: [facility];  Service: Orthopedics;  Laterality: Left;  . LAPAROSCOPIC RIGHT COLECTOMY N/A 08/31/2017   Procedure: LAPAROSCOPIC ASSISTED ASCENDING COLECTOMY;  Surgeon: Fanny Skates, MD;  Location: [facility];  Service: General;  Laterality: N/A;    Family History  Problem Relation Age of Onset  . Stroke Mother   . Arthritis Mother   . Asthma Mother   . Emphysema Mother   . Stroke Father   . Arthritis Sister   . Stroke Sister   .  Heart attack Neg Hx     Social History   Socioeconomic History  . Marital status: Married    Spouse name: Ivin Booty  . Number of children: 1  . Years of education: 42  . Highest education level: 9th grade  Occupational History  . Occupation: Retired    Comment: Surveyor, quantity - [employer]  Tobacco Use  . Smoking status: Former Smoker    Packs/day: 1.00    Years: 10.00    Pack years: 10.00    Types: Cigarettes    Quit date: 06/01/1982    Years since quitting: 37.5  . Smokeless tobacco: Never Used  . Tobacco comment: "quit smoking ~ 25 years ago; smoked ~ 1 pk/wk"  Vaping Use  .  Vaping Use: Never used  Substance and Sexual Activity  . Alcohol use: Yes    Alcohol/week: 1.0 standard drink    Types: 1 Cans of beer per week    Comment: occasionally  . Drug use: No  . Sexual activity: Never  Other Topics Concern  . Not on file  Social History Narrative   Not exercising due to [place] was a member at the [facility]. Just had eye exam all clear.   Social Determinants of Health   Financial Resource Strain: Low Risk   . Difficulty of Paying Living Expenses: Not hard at all  Food Insecurity: No Food Insecurity  . Worried About Charity fundraiser in the Last Year: Never true  . Ran Out of Food in the Last Year: Never true  Transportation Needs: No Transportation Needs  . Lack of Transportation (Medical): No  . Lack of Transportation (Non-Medical): No  Physical Activity: Inactive  . Days of Exercise per Week: 0 days  . Minutes of Exercise per Session: 0 min  Stress: No Stress Concern Present  . Feeling of Stress : Not at all  Social Connections: Moderately Integrated  . Frequency of Communication with Friends and Family: Once a week  . Frequency of Social Gatherings with Friends and Family: Once a week  . Attends Religious Services: More than 4 times per year  . Active Member of Clubs or Organizations: Yes  . Attends Archivist Meetings: 1 to 4 times per year  . Marital Status: Married  Human resources officer Violence: Not At Risk  . Fear of Current or Ex-Partner: No  . Emotionally Abused: No  . Physically Abused: No  . Sexually Abused: No    Outpatient Medications Prior to Visit  Medication Sig Dispense Refill  . albuterol (PROVENTIL HFA;VENTOLIN HFA) 108 (90 Base) MCG/ACT inhaler Inhale 2 puffs every 6 (six) hours as needed into the lungs for wheezing or shortness of breath.    . AMBULATORY NON FORMULARY MEDICATION Medication Name: 4 prong cane. Dx: gait instability 1 vial 0  . AMBULATORY NON FORMULARY MEDICATION Medication Name: 15 to 20 cm water pressure  above the knee compression stocks.  Diagnosis chronic lower extremity edema and venous stasis. 1 vial 0  . Ascorbic Acid (VITAMIN C PO) Take 1 tablet by mouth daily.    Marland Kitchen aspirin EC 81 MG tablet Take 81 mg by mouth daily.      Marland Kitchen atorvastatin (LIPITOR) 40 MG tablet TAKE 1 TABLET BY MOUTH AT  BEDTIME 90 tablet 3  . Blood Glucose Monitoring Suppl (ONE TOUCH ULTRA 2) w/Device KIT Onetouch ultra kit. DX: E11.9 1 each 0  . cetirizine (ZYRTEC) 10 MG tablet Take 10 mg by mouth daily.    Marland Kitchen escitalopram (  LEXAPRO) 20 MG tablet Take 1 tablet (20 mg total) by mouth daily. 90 tablet 1  . fluticasone (FLONASE) 50 MCG/ACT nasal spray One spray in each nostril twice a day, use left hand for right nostril, and right hand for left nostril. 48 g 3  . folic acid (FOLVITE) 1 MG tablet Take 1 mg by mouth daily.    Marland Kitchen glucose blood (ONE TOUCH ULTRA TEST) test strip For testing blood sugars twice daily.E11.9 200 each 12  . ipratropium-albuterol (DUONEB) 0.5-2.5 (3) MG/3ML SOLN USE 1 VIAL VIA NEBULIZER EVERY 2 HOURS AS NEEDED FOR WHEEZING, SHORTNESS OF BREATH. 4050 mL 3  . levothyroxine (SYNTHROID) 125 MCG tablet TAKE 1 TABLET BY MOUTH  DAILY BEFORE BREAKFAST 90 tablet 3  . metFORMIN (GLUCOPHAGE XR) 500 MG 24 hr tablet TAKE 1 TABLET BY MOUTH  DAILY WITH BREAKFAST 90 tablet 3  . Multiple Vitamins-Minerals (ICAPS AREDS 2) CAPS Take 1 capsule 2 (two) times daily by mouth.    . nitroGLYCERIN (NITROSTAT) 0.4 MG SL tablet DISSOLVE 1 TABLET UNDER THE TONGUE EVERY 5 MINUTES AS  NEEDED CHEST PAIN. NOT TO  EXCEED 3 TABLETS IN 15  MINUTES 25 tablet 0  . oxybutynin (DITROPAN-XL) 10 MG 24 hr tablet Take 10 mg by mouth at bedtime.    . potassium chloride (KLOR-CON) 10 MEQ tablet TAKE 1 TABLET(10 MEQ) BY MOUTH TWICE DAILY 180 tablet 3  . Tiotropium Bromide-Olodaterol (STIOLTO RESPIMAT) 2.5-2.5 MCG/ACT AERS Inhale 2 puffs into the lungs daily. 4 g 5  . torsemide (DEMADEX) 20 MG tablet TAKE 1 TABLET BY MOUTH  TWICE DAILY 180 tablet 3  .  zolpidem (AMBIEN) 10 MG tablet TAKE 1 TABLET BY MOUTH EVERY DAY AT BEDTIME AS NEEDED FOR SLEEP 30 tablet 3   No facility-administered medications prior to visit.    No Known Allergies  ROS Review of Systems    Objective:    Physical Exam Constitutional:      Appearance: He is well-developed.  HENT:     Head: Normocephalic and atraumatic.  Cardiovascular:     Rate and Rhythm: Normal rate and regular rhythm.     Heart sounds: Normal heart sounds.  Pulmonary:     Effort: Pulmonary effort is normal.     Breath sounds: Normal breath sounds.  Skin:    General: Skin is warm and dry.  Neurological:     Mental Status: He is alert and oriented to person, place, and time.  Psychiatric:        Behavior: Behavior normal.     BP 112/61   Pulse 73   Ht '5\' 11"'  (1.803 m)   Wt 252 lb (114.3 kg)   SpO2 98%   BMI 35.15 kg/m  Wt Readings from Last 3 Encounters:  12/14/19 252 lb (114.3 kg)  12/08/19 252 lb 12.8 oz (114.7 kg)  09/13/19 258 lb (117 kg)     There are no preventive care reminders to display for this patient.  There are no preventive care reminders to display for this patient.  Lab Results  Component Value Date   TSH 2.33 04/04/2019   Lab Results  Component Value Date   WBC 6.2 06/11/2019   HGB 13.2 06/11/2019   HCT 42.2 06/11/2019   MCV 97.2 06/11/2019   PLT 183 06/11/2019   Lab Results  Component Value Date   NA 143 06/11/2019   K 3.8 06/11/2019   CO2 27 06/11/2019   GLUCOSE 141 (H) 06/11/2019   BUN 23 06/11/2019  CREATININE 1.10 06/11/2019   BILITOT 0.7 06/11/2019   ALKPHOS 84 06/11/2019   AST 26 06/11/2019   ALT 27 06/11/2019   PROT 6.6 06/11/2019   ALBUMIN 3.6 06/11/2019   CALCIUM 9.0 06/11/2019   ANIONGAP 12 06/11/2019   GFR 89.45 05/29/2015   Lab Results  Component Value Date   CHOL 150 01/25/2019   Lab Results  Component Value Date   HDL 51 01/25/2019   Lab Results  Component Value Date   LDLCALC 78 01/25/2019   Lab Results   Component Value Date   TRIG 128 01/25/2019   Lab Results  Component Value Date   CHOLHDL 2.9 01/25/2019   Lab Results  Component Value Date   HGBA1C 6.0 (A) 12/14/2019      Assessment & Plan:   Problem List Items Addressed This Visit      Cardiovascular and Mediastinum   Essential hypertension, benign (Chronic)   Relevant Orders   COMPLETE METABOLIC PANEL WITH GFR   Lipid panel   CBC     Endocrine   Diabetes mellitus type 2 in obese (HCC) - Primary    A1c looks phenomenal today at 6.0.  Improved from last time.  Keep up the great work.  He has been going to the [facility] 3 days a week consistently he has a workout partner.      Relevant Orders   POCT glycosylated hemoglobin (Hb A1C) (Completed)   COMPLETE METABOLIC PANEL WITH GFR   Lipid panel   CBC     Other   Primary insomnia    We will try to send 90-day supply to mail order but explained to him that often times Medicare does not cover this and sometimes they will not pay for 90-day supply because it is a scheduled drug.  Of but I will be happy to try to send it through to [facility] to see if I will fill it.      Relevant Medications   zolpidem (AMBIEN) 10 MG tablet    Other Visit Diagnoses    Rectal bleeding       Relevant Orders   Ambulatory referral to Gastroenterology   Jock itch       Relevant Medications   nystatin cream (MYCOSTATIN)      Meds ordered this encounter  Medications  . zolpidem (AMBIEN) 10 MG tablet    Sig: TAKE 1 TABLET BY MOUTH EVERY DAY AT BEDTIME AS NEEDED FOR SLEEP    Dispense:  90 tablet    Refill:  0    Not to exceed 2 additional fills before 08/06/2019  . nystatin cream (MYCOSTATIN)    Sig: Apply 1 application topically 2 (two) times daily.    Dispense:  60 g    Refill:  1    Follow-up: Return in about 4 months (around 04/15/2020) for Diabetes follow-up.    Beatrice Lecher, MD

## 2020-01-15 ENCOUNTER — Encounter: Payer: Self-pay | Admitting: Internal Medicine

## 2020-01-22 DIAGNOSIS — I10 Essential (primary) hypertension: Secondary | ICD-10-CM | POA: Diagnosis not present

## 2020-01-22 DIAGNOSIS — E1169 Type 2 diabetes mellitus with other specified complication: Secondary | ICD-10-CM | POA: Diagnosis not present

## 2020-01-22 LAB — HM DIABETES EYE EXAM

## 2020-01-23 LAB — LIPID PANEL
Cholesterol: 199 mg/dL (ref <200)
HDL: 58 mg/dL (ref >=40)
LDL Cholesterol (Calc): 117 mg/dL (calc) — ABNORMAL HIGH
Non-HDL Cholesterol (Calc): 141 mg/dL (calc) — ABNORMAL HIGH (ref <130)
Total CHOL/HDL Ratio: 3.4 (calc) (ref <5.0)
Triglycerides: 129 mg/dL (ref <150)

## 2020-01-23 LAB — COMPLETE METABOLIC PANEL WITH GFR
AG Ratio: 1.4 (calc) (ref 1.0–2.5)
ALT: 15 U/L (ref 9–46)
AST: 20 U/L (ref 10–35)
Albumin: 3.9 g/dL (ref 3.6–5.1)
Alkaline phosphatase (APISO): 84 U/L (ref 35–144)
BUN: 21 mg/dL (ref 7–25)
CO2: 29 mmol/L (ref 20–32)
Calcium: 8.5 mg/dL — ABNORMAL LOW (ref 8.6–10.3)
Chloride: 100 mmol/L (ref 98–110)
Creat: 1.04 mg/dL (ref 0.70–1.11)
GFR, Est African American: 72 mL/min/{1.73_m2} (ref >=60)
GFR, Est Non African American: 62 mL/min/{1.73_m2} (ref >=60)
Globulin: 2.7 g/dL (calc) (ref 1.9–3.7)
Glucose, Bld: 95 mg/dL (ref 65–99)
Potassium: 3.9 mmol/L (ref 3.5–5.3)
Sodium: 142 mmol/L (ref 135–146)
Total Bilirubin: 0.5 mg/dL (ref 0.2–1.2)
Total Protein: 6.6 g/dL (ref 6.1–8.1)

## 2020-01-23 LAB — CBC
HCT: 39.3 % (ref 38.5–50.0)
Hemoglobin: 12.8 g/dL — ABNORMAL LOW (ref 13.2–17.1)
MCH: 30.3 pg (ref 27.0–33.0)
MCHC: 32.6 g/dL (ref 32.0–36.0)
MCV: 92.9 fL (ref 80.0–100.0)
MPV: 10.4 fL (ref 7.5–12.5)
Platelets: 214 10*3/uL (ref 140–400)
RBC: 4.23 10*6/uL (ref 4.20–5.80)
RDW: 12.7 % (ref 11.0–15.0)
WBC: 7.5 10*3/uL (ref 3.8–10.8)

## 2020-02-02 ENCOUNTER — Encounter: Payer: Self-pay | Admitting: *Deleted

## 2020-02-02 NOTE — Progress Notes (Signed)
error 

## 2020-02-06 ENCOUNTER — Other Ambulatory Visit: Payer: Self-pay

## 2020-02-06 ENCOUNTER — Ambulatory Visit (INDEPENDENT_AMBULATORY_CARE_PROVIDER_SITE_OTHER): Payer: Medicare Other | Admitting: Family Medicine

## 2020-02-06 DIAGNOSIS — Z23 Encounter for immunization: Secondary | ICD-10-CM | POA: Diagnosis not present

## 2020-02-06 NOTE — Progress Notes (Signed)
Pt here for flu shot.   He tolerated immunization well.

## 2020-02-22 ENCOUNTER — Telehealth: Payer: Self-pay

## 2020-02-22 NOTE — Telephone Encounter (Signed)
Pt's wife left a vm msg requesting advise from provider. Per wife, they are in the process of moving and pt has been more nervous than usual. Requesting if it would be okay for pt to take one pill in the morning and evening? Pls advise, thanks.

## 2020-02-22 NOTE — Telephone Encounter (Signed)
Im sorry... which pill? His lexapro?

## 2020-02-23 NOTE — Telephone Encounter (Signed)
No voicemail and unable to speak to patient.

## 2020-02-23 NOTE — Telephone Encounter (Signed)
Spoke w/pt's wife about medication.   Per Dr. Madilyn Fireman he CANNOT TAKE MORE THAN WHAT HE IS CURRENTLY ON HE IS ON THE MAXIMUM DOSAGE.   Also Dr. Madilyn Fireman cannot make any changes to his medications without speaking to him first.   She stated that he told her yesterday that he was becoming nervous.  I let her know that we can do a virtual visit to speak with him about this. She informed him and he is agreeable to this.

## 2020-02-27 ENCOUNTER — Encounter: Payer: Self-pay | Admitting: Family Medicine

## 2020-02-27 ENCOUNTER — Telehealth (INDEPENDENT_AMBULATORY_CARE_PROVIDER_SITE_OTHER): Payer: Medicare Other | Admitting: Family Medicine

## 2020-02-27 VITALS — BP 119/59

## 2020-02-27 DIAGNOSIS — F411 Generalized anxiety disorder: Secondary | ICD-10-CM

## 2020-02-27 MED ORDER — HYDROXYZINE HCL 25 MG PO TABS: 25.0000 mg | ORAL_TABLET | Freq: Three times a day (TID) | ORAL | 0 refills | Status: DC | PRN

## 2020-02-27 NOTE — Assessment & Plan Note (Signed)
Discussed options. I really want to be careful about sedation. Recommend a trial of hydroxyzine to use did warn about potential for sedation as well as dry mouth and to use it sparingly. I really prefer to stay away from benzodiazepines if at all possible. He is already doing well in regards to his depression with his Lexapro. I did warn him not to take the hydroxyzine within several hours of going to bed and using his Ambien.

## 2020-02-27 NOTE — Progress Notes (Signed)
Pt reports that he's just nervous. He reports that they are in the process of moving and he has been very agitated. He does feel that the Lexapro is controlling his sxs well

## 2020-02-27 NOTE — Progress Notes (Signed)
Virtual Visit via Telephone Note  I connected with Bryan Hatfield on 02/27/20 at  3:20 PM EDT by telephone and verified that I am speaking with the correct person using two identifiers.   I discussed the limitations, risks, security and privacy concerns of performing an evaluation and management service by telephone and the availability of in person appointments. I also discussed with the patient that there may be a patient responsible charge related to this service. The patient expressed understanding and agreed to proceed.  Patient location: at home Provider loccation: In office   Subjective:    CC: Mood  HPI: Bryan Hatfield is a 86 year old male who reports that he is just feeling very anxious and overwhelmed. He and his wife who previously lived in [place] are actually moving back into the area will likely be in about 2 to 3 weeks. They have been able to find an apartment here locally and are quite excited about the move but feels like he is feeling stressed and overwhelmed with packing and trying to get ready. He reports he still sleeping well on the Ambien which he takes daily. He does not feel depressed and he is happy with his Lexapro he is just feeling moments of agitation and and anxiety. He has never taken a rescue medication before but is asking for something to help "calm his nerves" that he could use as needed.   Past medical history, Surgical history, Family history not pertinant except as noted below, Social history, Allergies, and medications have been entered into the medical record, reviewed, and corrections made.   Review of Systems: No fevers, chills, night sweats, weight loss, chest pain, or shortness of breath.   Objective:    General: Speaking clearly in complete sentences without any shortness of breath.  Alert and oriented x3.  Normal judgment. No apparent acute distress.    Impression and Recommendations:    GAD (generalized anxiety disorder) Discussed options. I  really want to be careful about sedation. Recommend a trial of hydroxyzine to use did warn about potential for sedation as well as dry mouth and to use it sparingly. I really prefer to stay away from benzodiazepines if at all possible. He is already doing well in regards to his depression with his Lexapro. I did warn him not to take the hydroxyzine within several hours of going to bed and using his Ambien.    I discussed the assessment and treatment plan with the patient. The patient was provided an opportunity to ask questions and all were answered. The patient agreed with the plan and demonstrated an understanding of the instructions.   The patient was advised to call back or seek an in-person evaluation if the symptoms worsen or if the condition fails to improve as anticipated.  I provided 15 minutes of non-face-to-face time during this encounter.   Beatrice Lecher, MD

## 2020-02-27 NOTE — Telephone Encounter (Signed)
Pt has appt today

## 2020-03-16 ENCOUNTER — Other Ambulatory Visit: Payer: Self-pay

## 2020-03-16 ENCOUNTER — Emergency Department (HOSPITAL_COMMUNITY): Payer: Medicare Other

## 2020-03-16 ENCOUNTER — Encounter (HOSPITAL_COMMUNITY): Payer: Self-pay

## 2020-03-16 ENCOUNTER — Emergency Department (HOSPITAL_COMMUNITY)
Admission: EM | Admit: 2020-03-16 | Discharge: 2020-03-16 | Disposition: A | Discharge status: Home / Self Care | Payer: Medicare Other | Attending: Emergency Medicine | Admitting: Emergency Medicine

## 2020-03-16 DIAGNOSIS — M791 Myalgia, unspecified site: Secondary | ICD-10-CM | POA: Insufficient documentation

## 2020-03-16 DIAGNOSIS — I5032 Chronic diastolic (congestive) heart failure: Secondary | ICD-10-CM | POA: Diagnosis not present

## 2020-03-16 DIAGNOSIS — Z87891 Personal history of nicotine dependence: Secondary | ICD-10-CM | POA: Diagnosis not present

## 2020-03-16 DIAGNOSIS — R0602 Shortness of breath: Secondary | ICD-10-CM | POA: Insufficient documentation

## 2020-03-16 DIAGNOSIS — Z7951 Long term (current) use of inhaled steroids: Secondary | ICD-10-CM | POA: Insufficient documentation

## 2020-03-16 DIAGNOSIS — E039 Hypothyroidism, unspecified: Secondary | ICD-10-CM | POA: Diagnosis not present

## 2020-03-16 DIAGNOSIS — Z955 Presence of coronary angioplasty implant and graft: Secondary | ICD-10-CM | POA: Diagnosis not present

## 2020-03-16 DIAGNOSIS — R0981 Nasal congestion: Secondary | ICD-10-CM | POA: Diagnosis not present

## 2020-03-16 DIAGNOSIS — R058 Other specified cough: Secondary | ICD-10-CM | POA: Insufficient documentation

## 2020-03-16 DIAGNOSIS — I25110 Atherosclerotic heart disease of native coronary artery with unstable angina pectoris: Secondary | ICD-10-CM | POA: Diagnosis not present

## 2020-03-16 DIAGNOSIS — Z79890 Hormone replacement therapy (postmenopausal): Secondary | ICD-10-CM | POA: Diagnosis not present

## 2020-03-16 DIAGNOSIS — R531 Weakness: Secondary | ICD-10-CM | POA: Insufficient documentation

## 2020-03-16 DIAGNOSIS — J449 Chronic obstructive pulmonary disease, unspecified: Secondary | ICD-10-CM | POA: Insufficient documentation

## 2020-03-16 DIAGNOSIS — Z20822 Contact with and (suspected) exposure to covid-19: Secondary | ICD-10-CM | POA: Diagnosis not present

## 2020-03-16 DIAGNOSIS — Z7984 Long term (current) use of oral hypoglycemic drugs: Secondary | ICD-10-CM | POA: Diagnosis not present

## 2020-03-16 DIAGNOSIS — Z7982 Long term (current) use of aspirin: Secondary | ICD-10-CM | POA: Insufficient documentation

## 2020-03-16 DIAGNOSIS — I110 Hypertensive heart disease with heart failure: Secondary | ICD-10-CM | POA: Insufficient documentation

## 2020-03-16 DIAGNOSIS — R059 Cough, unspecified: Secondary | ICD-10-CM | POA: Diagnosis not present

## 2020-03-16 LAB — URINALYSIS, ROUTINE W REFLEX MICROSCOPIC
Bilirubin Urine: NEGATIVE
Glucose, UA: NEGATIVE mg/dL
Hgb urine dipstick: NEGATIVE
Ketones, ur: NEGATIVE mg/dL
Leukocytes,Ua: NEGATIVE
Nitrite: NEGATIVE
Protein, ur: NEGATIVE mg/dL
Specific Gravity, Urine: 1.006 (ref 1.005–1.030)
pH: 6 (ref 5.0–8.0)

## 2020-03-16 LAB — COMPREHENSIVE METABOLIC PANEL
ALT: 16 U/L (ref 0–44)
AST: 22 U/L (ref 15–41)
Albumin: 3.5 g/dL (ref 3.5–5.0)
Alkaline Phosphatase: 101 U/L (ref 38–126)
Anion gap: 8 (ref 5–15)
BUN: 17 mg/dL (ref 8–23)
CO2: 29 mmol/L (ref 22–32)
Calcium: 8.4 mg/dL — ABNORMAL LOW (ref 8.9–10.3)
Chloride: 100 mmol/L (ref 98–111)
Creatinine, Ser: 1.02 mg/dL (ref 0.61–1.24)
GFR, Estimated: >60 mL/min (ref >60)
Glucose, Bld: 147 mg/dL — ABNORMAL HIGH (ref 70–99)
Potassium: 3.9 mmol/L (ref 3.5–5.1)
Sodium: 137 mmol/L (ref 135–145)
Total Bilirubin: 0.5 mg/dL (ref 0.3–1.2)
Total Protein: 7 g/dL (ref 6.5–8.1)

## 2020-03-16 LAB — CBC WITH DIFFERENTIAL/PLATELET
Abs Immature Granulocytes: 0.03 10*3/uL (ref 0.00–0.07)
Basophils Absolute: 0 10*3/uL (ref 0.0–0.1)
Basophils Relative: 0 %
Eosinophils Absolute: 0.3 10*3/uL (ref 0.0–0.5)
Eosinophils Relative: 4 %
HCT: 39.2 % (ref 39.0–52.0)
Hemoglobin: 12.6 g/dL — ABNORMAL LOW (ref 13.0–17.0)
Immature Granulocytes: 0 %
Lymphocytes Relative: 26 %
Lymphs Abs: 1.7 10*3/uL (ref 0.7–4.0)
MCH: 30 pg (ref 26.0–34.0)
MCHC: 32.1 g/dL (ref 30.0–36.0)
MCV: 93.3 fL (ref 80.0–100.0)
Monocytes Absolute: 0.6 10*3/uL (ref 0.1–1.0)
Monocytes Relative: 9 %
Neutro Abs: 4.1 10*3/uL (ref 1.7–7.7)
Neutrophils Relative %: 61 %
Platelets: 227 10*3/uL (ref 150–400)
RBC: 4.2 MIL/uL — ABNORMAL LOW (ref 4.22–5.81)
RDW: 13.2 % (ref 11.5–15.5)
WBC: 6.8 10*3/uL (ref 4.0–10.5)
nRBC: 0 % (ref 0.0–0.2)

## 2020-03-16 LAB — RESPIRATORY PANEL BY RT PCR (FLU A&B, COVID)
Influenza A by PCR: NEGATIVE
Influenza B by PCR: NEGATIVE
SARS Coronavirus 2 by RT PCR: NEGATIVE

## 2020-03-16 LAB — BRAIN NATRIURETIC PEPTIDE: B Natriuretic Peptide: 65 pg/mL (ref 0.0–100.0)

## 2020-03-16 MED ORDER — ALBUTEROL SULFATE HFA 108 (90 BASE) MCG/ACT IN AERS
4.0000 | INHALATION_SPRAY | Freq: Once | RESPIRATORY_TRACT | Status: AC
  Administered 2020-03-16: 4 via RESPIRATORY_TRACT
  Filled 2020-03-16: qty 6.7

## 2020-03-16 MED ORDER — PREDNISONE 20 MG PO TABS: 40.0000 mg | ORAL_TABLET | Freq: Every day | ORAL | 0 refills | Status: AC

## 2020-03-16 MED ORDER — DOXYCYCLINE HYCLATE 100 MG PO CAPS: 100.0000 mg | ORAL_CAPSULE | Freq: Two times a day (BID) | ORAL | 0 refills | Status: AC

## 2020-03-16 MED ORDER — PREDNISONE 50 MG PO TABS
60.0000 mg | ORAL_TABLET | Freq: Once | ORAL | Status: AC
  Administered 2020-03-16: 60 mg via ORAL
  Filled 2020-03-16: qty 1

## 2020-03-16 NOTE — ED Notes (Signed)
Verbalized understanding of discharge instructions.

## 2020-03-16 NOTE — ED Notes (Signed)
Ambulated in hallway approx 200 ft. O2 dropped to 88% with no distress. Quickly recovered to baseline at 94% room air.

## 2020-03-16 NOTE — ED Notes (Signed)
Patient denies pain and is resting comfortably.  

## 2020-03-16 NOTE — ED Triage Notes (Signed)
Pt presents to ED with complaints of SOB, generalized weakness, headache, body aches x couple of days.

## 2020-03-16 NOTE — Discharge Instructions (Signed)
Thank you for letting us take care of you in the ER today  Please take the 5 days of prednisone to help with your possible COPD exacerbation.  I also prescribed a antibiotic called doxycycline to help cover you for a possible early pneumonia.  Please make sure to return to the ER for any new or worsening shortness of breath, fevers, chills, chest pain, or any other new or concerning symptoms.  Please follow-up with your primary care doctor within the next week.

## 2020-03-16 NOTE — ED Provider Notes (Signed)
[facility] EMERGENCY DEPARTMENT Provider Note   CSN: [id number] Arrival date & time: 03/16/20  1005     History Chief Complaint  Patient presents with  . Weakness    Bryan Hatfield is a 86 y.o. male.  HPI 86 year old male with a history of COPD, GERD, hyperglycemia, hypertension, hypothyroidism, DM type II, obesity presents to the ER with complaints of generalized weakness, headache, body aches, dry cough, nasal congestion over the last 3 days.  He is unsure he has had any fevers, but denies any chills.  He is fully vaccinated for Covid.  He has been compliant with his COPD medications, but feels like he is short of breath even at rest. Pt is a former smoker but quit in 1984.   He denies any chest pain, lower extremity swelling, nausea, vomiting, abdominal pain, urinary symptoms.  He denies any sick contacts.  Denies any unilateral weakness, falls.  He has had a history of exacerbations for COPD. He is not on home oxygen at this time     Past Medical History:  Diagnosis Date  . Acute respiratory failure ([place]) 09/25/11   hypoxic  . Anxiety   . Arthritis   . Bronchitis    "seems like 2x/year"  . CAD (coronary artery disease)    a. PTCA 2001. b. f/u cath 05/2012 heavy calcification, no PCI required. c. f/u cath for recurrent sx 2017: stable mod CAD unchanged from prior, mild progression of small vessel disease, elevated LVEDP.  . Colon polyps   . COPD (chronic obstructive pulmonary disease) ([place])   . Diabetes mellitus without complication ([place])   . GERD (gastroesophageal reflux disease)   . High cholesterol   . Hyperglycemia   . Hypertension   . Hypothyroidism   . Peripheral vascular disease ([place])    patient denies knowledge of, but listed in his chart  . Pleural effusion    Parapneumonic ?r/t PNA s/p thoracentesis 2008  . Pneumonia    hx  . Sleep apnea    cpap     Patient Active Problem List   Diagnosis Date Noted  . Venous stasis 05/29/2019  . Severe obesity (BMI  35.0-39.9) with comorbidity ([place]) 01/23/2019  . History of squamous cell carcinoma 08/04/2018  . CAD (coronary artery disease) 09/02/2017  . Peripheral vascular disease ([place]) 09/02/2017  . Acute kidney injury ([place]) 09/02/2017  . Acute blood loss anemia 09/02/2017  . COPD (chronic obstructive pulmonary disease) ([place]) 09/02/2017  . High cholesterol 09/02/2017  . Diabetes mellitus type 2 in obese ([place]) 09/02/2017  . BMI 39.0-39.9,adult 09/02/2017  . Chronic diastolic heart failure ([place]) 09/02/2017  . OSA on CPAP 09/02/2017  . Thrombocytopenia ([place]) 09/02/2017  . Mass of hepatic flexure of colon 08/31/2017  . 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 Acute medial meniscus tear of left knee 04/19/2017  . Primary osteoarthritis of left knee 03/01/2017  . Primary insomnia 02/19/2015  . GAD (generalized anxiety disorder) 11/21/2014  . Arterial hypotension 09/17/2014  . Hypothyroid 09/07/2014  . Essential hypertension, benign 06/02/2013  . Hyperlipidemia 06/02/2013  . MDD (major depressive disorder), recurrent, in full remission ([place]) 06/02/2013  . Coronary artery disease involving native coronary artery of native heart with angina pectoris ([place]) 05/09/2012  . Morbid obesity ([place]) 09/25/2011  . Chronic insomnia 09/21/2007  . COPD GOLD 0/I 09/21/2007  . Chronic obstructive bronchitis without exacerbation ([place]) 09/21/2007  . Sleep apnea 09/20/2007    Past Surgical History:  Procedure Laterality Date  . CARDIAC CATHETERIZATION  N/A 11/25/2015   Procedure: Left Heart Cath and Coronary Angiography;  Surgeon: Leonie Man, MD;  Location: [facility];  Service: Cardiovascular;  Laterality: N/A;  . CATARACT EXTRACTION W/ INTRAOCULAR LENS  IMPLANT, BILATERAL  ~ 2006  . CHOLECYSTECTOMY N/A 10/11/2012   Procedure: LAPAROSCOPIC CHOLECYSTECTOMY WITH INTRAOPERATIVE CHOLANGIOGRAM;  Surgeon: Adin Hector, MD;  Location: [facility];  Service: General;  Laterality: N/A;  . COLONOSCOPY     . CORONARY ANGIOPLASTY  01/2000  . HEMORRHOID SURGERY  1960's  . KNEE ARTHROSCOPY Left 04/19/2017   Procedure: ARTHROSCOPY KNEE, SUBCHONDROPLASTY;  Surgeon: Dorna Leitz, MD;  Location: [facility];  Service: Orthopedics;  Laterality: Left;  . KNEE ARTHROSCOPY WITH MEDIAL MENISECTOMY Left 04/19/2017   Procedure: KNEE ARTHROSCOPY WITH MEDIAL AND LATERAL MENISECTOMY;  Surgeon: Dorna Leitz, MD;  Location: [facility];  Service: Orthopedics;  Laterality: Left;  . LAPAROSCOPIC RIGHT COLECTOMY N/A 08/31/2017   Procedure: LAPAROSCOPIC ASSISTED ASCENDING COLECTOMY;  Surgeon: Fanny Skates, MD;  Location: [facility];  Service: General;  Laterality: N/A;       Family History  Problem Relation Age of Onset  . Stroke Mother   . Arthritis Mother   . Asthma Mother   . Emphysema Mother   . Stroke Father   . Arthritis Sister   . Stroke Sister   . Heart attack Neg Hx     Social History   Tobacco Use  . Smoking status: Former Smoker    Packs/day: 1.00    Years: 10.00    Pack years: 10.00    Types: Cigarettes    Quit date: 06/01/1982    Years since quitting: 37.8  . Smokeless tobacco: Never Used  . Tobacco comment: "quit smoking ~ 25 years ago; smoked ~ 1 pk/wk"  Vaping Use  . Vaping Use: Never used  Substance Use Topics  . Alcohol use: Yes    Alcohol/week: 1.0 standard drink    Types: 1 Cans of beer per week    Comment: occasionally  . Drug use: No    Home Medications Prior to Admission medications   Medication Sig Start Date End Date Taking? Authorizing Provider  albuterol (PROVENTIL HFA;VENTOLIN HFA) 108 (90 Base) MCG/ACT inhaler Inhale 2 puffs every 6 (six) hours as needed into the lungs for wheezing or shortness of breath.    [provider]  AMBULATORY NON FORMULARY MEDICATION Medication Name: 4 prong cane. Dx: gait instability 03/09/17   [name], MD  AMBULATORY NON FORMULARY MEDICATION Medication Name: 15 to 20 cm water pressure above the knee compression stocks.   Diagnosis chronic lower extremity edema and venous stasis. 05/29/19   [name], MD  Ascorbic Acid (VITAMIN C PO) Take 1 tablet by mouth daily.    [provider]  aspirin EC 81 MG tablet Take 81 mg by mouth daily.      [provider]  atorvastatin (LIPITOR) 40 MG tablet TAKE 1 TABLET BY MOUTH AT  BEDTIME Patient taking differently: Take 40 mg by mouth at bedtime.  07/05/19   [name], MD  Blood Glucose Monitoring Suppl (ONE TOUCH ULTRA 2) w/Device KIT Onetouch ultra kit. DX: E11.9 09/21/17   [name], MD  cetirizine (ZYRTEC) 10 MG tablet Take 10 mg by mouth daily.    [provider]  doxycycline (VIBRAMYCIN) 100 MG capsule Take 1 capsule (100 mg total) by mouth 2 (two) times daily for 5 days. 03/16/20 03/21/20  Garald Balding, PA-C  escitalopram (LEXAPRO)  20 MG tablet Take 1 tablet (20 mg total) by mouth daily. 11/03/19   [name], MD  fluticasone (FLONASE) 50 MCG/ACT nasal spray One spray in each nostril twice a day, use left hand for right nostril, and right hand for left nostril. Patient taking differently: Place 1 spray into both nostrils in the morning and at bedtime. use left hand for right nostril, and right hand for left nostril. 05/31/18   Donella Stade, PA-C  folic acid (FOLVITE) 1 MG tablet Take 1 mg by mouth daily. 10/19/18   [provider]  glucose blood (ONE TOUCH ULTRA TEST) test strip For testing blood sugars twice daily.E11.9 09/29/17   [name], MD  hydrOXYzine (ATARAX/VISTARIL) 25 MG tablet Take 1 tablet (25 mg total) by mouth 3 (three) times daily as needed for anxiety. 02/27/20   [name], MD  ipratropium-albuterol (DUONEB) 0.5-2.5 (3) MG/3ML SOLN USE 1 VIAL VIA NEBULIZER EVERY 2 HOURS AS NEEDED FOR WHEEZING, SHORTNESS OF BREATH. Patient taking differently: Inhale 3 mLs into the lungs every 2 (two) hours as needed (Wheezing/SOB).  02/12/17   [name], MD    levothyroxine (SYNTHROID) 125 MCG tablet TAKE 1 TABLET BY MOUTH  DAILY BEFORE BREAKFAST Patient taking differently: Take 125 mcg by mouth daily before breakfast.  12/07/18   [name], MD  metFORMIN (GLUCOPHAGE XR) 500 MG 24 hr tablet TAKE 1 TABLET BY MOUTH  DAILY WITH BREAKFAST Patient taking differently: Take 500 mg by mouth daily with breakfast.  07/10/19   [name], MD  Multiple Vitamins-Minerals (ICAPS AREDS 2) CAPS Take 1 capsule 2 (two) times daily by mouth.    [provider]  nitroGLYCERIN (NITROSTAT) 0.4 MG SL tablet DISSOLVE 1 TABLET UNDER THE TONGUE EVERY 5 MINUTES AS  NEEDED CHEST PAIN. NOT TO  EXCEED 3 TABLETS IN 15  MINUTES Patient taking differently: Place 0.4 mg under the tongue every 5 (five) minutes as needed for chest pain. NOT TO  EXCEED 3 TABLETS IN 15  MINUTES 10/20/19   Belva Crome, MD  nystatin cream (MYCOSTATIN) Apply 1 application topically 2 (two) times daily. 12/14/19   [name], MD  oxybutynin (DITROPAN-XL) 10 MG 24 hr tablet Take 10 mg by mouth at bedtime.    [provider]  potassium chloride (KLOR-CON) 10 MEQ tablet TAKE 1 TABLET(10 MEQ) BY MOUTH TWICE DAILY Patient taking differently: Take 10 mEq by mouth 2 (two) times daily.  10/18/19   [name], MD  predniSONE (DELTASONE) 20 MG tablet Take 2 tablets (40 mg total) by mouth daily for 5 days. 03/16/20 03/21/20  Garald Balding, PA-C  Tiotropium Bromide-Olodaterol (STIOLTO RESPIMAT) 2.5-2.5 MCG/ACT AERS Inhale 2 puffs into the lungs daily. 05/29/19   [name], MD  torsemide (DEMADEX) 20 MG tablet TAKE 1 TABLET BY MOUTH  TWICE DAILY Patient taking differently: Take 20 mg by mouth 2 (two) times daily.  07/05/19   [name], MD  triamcinolone cream (KENALOG) 0.1 % Apply 1 application topically 2 (two) times daily. 11/10/19   [provider]  zolpidem (AMBIEN) 10 MG tablet TAKE 1 TABLET BY MOUTH EVERY DAY AT BEDTIME AS NEEDED  FOR SLEEP Patient taking differently: Take 10 mg by mouth at bedtime as needed for sleep.  12/14/19   [name], MD    Allergies    Patient has no known allergies.  Review of Systems   Review of Systems  Constitutional: Positive for  activity change and fatigue. Negative for chills and fever.  HENT: Negative for ear pain and sore throat.   Eyes: Negative for pain and visual disturbance.  Respiratory: Positive for cough and shortness of breath.   Cardiovascular: Negative for chest pain, palpitations and leg swelling.  Gastrointestinal: Negative for abdominal pain and vomiting.  Genitourinary: Negative for dysuria and hematuria.  Musculoskeletal: Negative for arthralgias and back pain.  Skin: Negative for color change and rash.  Neurological: Positive for weakness. Negative for seizures and syncope.  All other systems reviewed and are negative.   Physical Exam Updated Vital Signs BP 117/78   Pulse 66   Temp 98.3 F (36.8 C) (Oral)   Resp 17   Ht _0  (1.803 m)   Wt 109.3 kg   SpO2 91%   BMI 33.61 kg/m   Physical Exam Vitals and nursing note reviewed.  Constitutional:      General: He is not in acute distress.    Appearance: Normal appearance. He is well-developed. He is obese. He is not ill-appearing, toxic-appearing or diaphoretic.  HENT:     Head: Normocephalic and atraumatic.  Eyes:     Conjunctiva/sclera: Conjunctivae normal.  Cardiovascular:     Rate and Rhythm: Normal rate and regular rhythm.     Pulses: Normal pulses.     Heart sounds: Normal heart sounds. No murmur heard.   Pulmonary:     Effort: Pulmonary effort is normal. No respiratory distress.     Breath sounds: Wheezing present.     Comments: Scant expiratory wheezes in the right upper and lower lobes bilaterally. Speaking in full sentences without increased WOB, chest rise equal and unlabored   Abdominal:     General: Abdomen is flat.     Palpations: Abdomen is soft.     Tenderness:  There is no abdominal tenderness.  Musculoskeletal:        General: No tenderness. Normal range of motion.     Cervical back: Neck supple.     Right lower leg: No edema.     Left lower leg: No edema.  Skin:    General: Skin is warm and dry.     Coloration: Skin is not pale.     Findings: No erythema.  Neurological:     General: No focal deficit present.     Mental Status: He is alert and oriented to person, place, and time.     Sensory: No sensory deficit.     Motor: No weakness.     ED Results / Procedures / Treatments   Labs (all labs ordered are listed, but only abnormal results are displayed) Labs Reviewed  CBC WITH DIFFERENTIAL/PLATELET - Abnormal; Notable for the following components:      Result Value   RBC 4.20 (*)    Hemoglobin 12.6 (*)    All other components within normal limits  COMPREHENSIVE METABOLIC PANEL - Abnormal; Notable for the following components:   Glucose, Bld 147 (*)    Calcium 8.4 (*)    All other components within normal limits  URINALYSIS, ROUTINE W REFLEX MICROSCOPIC - Abnormal; Notable for the following components:   APPearance HAZY (*)    All other components within normal limits  RESPIRATORY PANEL BY RT PCR (FLU A&B, COVID)  BRAIN NATRIURETIC PEPTIDE    EKG EKG Interpretation  Date/Time:  Saturday March 16 2020 10:20:20 EDT Ventricular Rate:  73 PR Interval:    QRS Duration: 101 QT Interval:  385 QTC Calculation: 425 R Axis:   -  67 Text Interpretation: Sinus rhythm Left anterior fascicular block Consider anterior infarct No significant change since last tracing Confirmed by Dorie Rank [phone]) on 03/16/2020 10:29:17 AM   Radiology DG Chest Portable 1 View  Result Date: 03/16/2020 CLINICAL DATA:  Shortness of breath with cough EXAM: PORTABLE CHEST 1 VIEW COMPARISON:  June 11, 2019 chest radiograph and CT angiogram chest FINDINGS: There is no edema or airspace opacity. Heart is borderline enlarged, stable, with pulmonary  vascularity normal. No adenopathy. No bone lesions. IMPRESSION: Borderline cardiac enlargement, stable. No edema or airspace opacity. Electronically Signed   By: Lowella Grip III M.D.   On: 03/16/2020 10:49    Procedures Procedures (including critical care time)  Medications Ordered in ED Medications  albuterol (VENTOLIN HFA) 108 (90 Base) MCG/ACT inhaler 4 puff (4 puffs Inhalation Given 03/16/20 1040)  predniSONE (DELTASONE) tablet 60 mg (60 mg Oral Given 03/16/20 1122)    ED Course  I have reviewed the triage vital signs and the nursing notes.  Pertinent labs & imaging results that were available during my care of the patient were reviewed by me and considered in my medical decision making (see chart for details).    MDM Rules/Calculators/A&P                          63:13AM:  86 year old male with generalized weakness, dry cough, headache, body aches for several days  On presentation, he is alert, oriented, nontoxic-appearing, no acute distress, resting comfortably in ER bed.  He is speaking in full sentences without increased work of breathing.  Vitals overall reassuring, no evidence of hypoxia or tachypnea.  He does have some scattered expiratory wheezes in the right upper and lower lobes on exam.  No lower extremity swelling.  Abdomen soft and nontender.  No murmurs noted.  The emergent differential diagnosis includes, but is not limited to, Pulmonary edema, bronchoconstriction, Pneumonia, Pulmonary embolism, Pneumotherax/ Hemothorax, Dysrythmia, ACS, COPD exacertbation, [id number], UTI, arrythmia,  We will start with basic labs, chest x-ray, UA, EKG, Covid test  12:04 PM: Labs reviewed and interpreted by me CMP without any electrolyte abnormalities, normal renal and liver function. CBC without leukocytosis, slightly decreased hemoglobin of 12.6 which appears to be stable. UA without evidence of UTI or blood Respiratory panel is negative BNP normal  Imaging reviewed and  interpreted by me -Chest x-ray without evidence of fluid overload, pneumonia, pneumothorax  EKG reviewed and interpreted by Dr. Tomi Bamberger and myself -No significant changes from previous  Patient received 4 puffs albuterol and 60 mg of prednisone here.  There is no evidence of CHF exacerbation, pneumonia, COVID, UTI.  Low concern for sepsis as the patient's vitals are overall reassuring, no WBCs count, no source of infection noted.  Low suspicion for ACS or PE as the patient has no EKG changes, does not complain of chest pain, is not tachypneic, hypoxic, or tachycardic. On re-evlaluation, lung sounds improved, and patient states he feels much less short of breathe. Pt was ambulated with pulse oximeter, did decrease to 88% with ambulation however quickly went up to 94% with rest.  Remains at 95%. Discussed this w/ Dr. Tomi Bamberger who feels the patient is stable to go home. Will send home with 40 mg prednisone taper for 5 days to treat for possible COPD exacerbation, will also add doxycycline to treat for possible bronchitis/early pneumonia as per discussion with Dr. Tomi Bamberger.  I discussed the findings with the patient who is overall reassured.  I discussed strict return precautions, he voiced understanding and is agreeable.  At this stage in the ED course, the patient has been medically screened and stable for discharge  Patient was seen and evaluated by Dr. Tomi Bamberger who is agreeable to the above plan and disposition    Final Clinical Impression(s) / ED Diagnoses Final diagnoses:  Weakness  Shortness of breath    Rx / DC Orders ED Discharge Orders         Ordered    predniSONE (DELTASONE) 20 MG tablet  Daily        03/16/20 1213    doxycycline (VIBRAMYCIN) 100 MG capsule  2 times daily        03/16/20 1213           Lyndel Safe 03/16/20 1225    Dorie Rank, MD 03/17/20 [phone]

## 2020-03-18 ENCOUNTER — Other Ambulatory Visit: Payer: Self-pay | Admitting: Family Medicine

## 2020-03-18 DIAGNOSIS — F411 Generalized anxiety disorder: Secondary | ICD-10-CM

## 2020-03-18 DIAGNOSIS — F5101 Primary insomnia: Secondary | ICD-10-CM

## 2020-03-21 ENCOUNTER — Ambulatory Visit: Payer: Medicare Other | Admitting: Internal Medicine

## 2020-03-21 ENCOUNTER — Encounter: Payer: Self-pay | Admitting: Internal Medicine

## 2020-03-21 VITALS — BP 130/72 | HR 67 | Ht 71.0 in | Wt 244.0 lb

## 2020-03-21 DIAGNOSIS — L309 Dermatitis, unspecified: Secondary | ICD-10-CM

## 2020-03-21 DIAGNOSIS — Z86010 Personal history of colonic polyps: Secondary | ICD-10-CM | POA: Diagnosis not present

## 2020-03-21 MED ORDER — CLOTRIMAZOLE-BETAMETHASONE 1-0.05 % EX CREA: TOPICAL_CREAM | Freq: Two times a day (BID) | CUTANEOUS | 1 refills | Status: AC

## 2020-03-21 NOTE — Patient Instructions (Signed)
I have prescribed Lotrisone generic to treat your rash on your bottom.  Put it on the area twice a day.  It may take a week or so to heal up perhaps 2 weeks.  If it comes back you can treated again.  Try not to wipe too hard or dry hard with a towel that they are just pat it so you do not irritate the skin.  I appreciate the opportunity to care for you. Gatha Mayer, MD, [facility]

## 2020-03-21 NOTE — Progress Notes (Signed)
Bryan Hatfield 86 y.o. 1926/12/23 [id number]  Assessment & Plan:   Encounter Diagnoses  Name Primary?  . Perianal dermatitis Yes  . Hx of adenomatous colonic polyps    He has a perianal dermatitis and we will treat as below.  He is reassured.  At his age would not do surveillance colonoscopy. Meds ordered this encounter  Medications  . clotrimazole-betamethasone (LOTRISONE) cream    Sig: Apply topically 2 (two) times daily.    Dispense:  30 g    Refill:  1   He may see me as needed. CC: [name], MD    Subjective:   Chief Complaint: Perianal itching and bleeding  HPI The patient is a 86 year old white man previously followed by Dr. Laurence Spates who is status post right hemicolectomy for a very large tubulovillous adenomatous polyp by Dr. Fanny Skates in 2019.  He also had multiple other adenomatous colon polyps removed at colonoscopy in 2019.  That was precipitated by some rectal bleeding.  He reports to me that he was told he did not need further colonoscopy follow-up after that.  He has some irritation in the perianal area and he says he scratched it and saw some blood.  His wife was concerned and insisted he come to the doctor.  He has a lot of itching and irritation in the perianal area and says he sweats and it collects there as well. No Known Allergies Current Meds  Medication Sig  . albuterol (PROVENTIL HFA;VENTOLIN HFA) 108 (90 Base) MCG/ACT inhaler Inhale 2 puffs every 6 (six) hours as needed into the lungs for wheezing or shortness of breath.  . AMBULATORY NON FORMULARY MEDICATION Medication Name: 4 prong cane. Dx: gait instability  . AMBULATORY NON FORMULARY MEDICATION Medication Name: 15 to 20 cm water pressure above the knee compression stocks.  Diagnosis chronic lower extremity edema and venous stasis.  . Ascorbic Acid (VITAMIN C PO) Take 1 tablet by mouth daily.  Marland Kitchen aspirin EC 81 MG tablet Take 81 mg by mouth daily.    Marland Kitchen atorvastatin (LIPITOR) 40  MG tablet TAKE 1 TABLET BY MOUTH AT  BEDTIME (Patient taking differently: Take 40 mg by mouth at bedtime. )  . Blood Glucose Monitoring Suppl (ONE TOUCH ULTRA 2) w/Device KIT Onetouch ultra kit. DX: E11.9  . cetirizine (ZYRTEC) 10 MG tablet Take 10 mg by mouth daily.  Marland Kitchen doxycycline (VIBRAMYCIN) 100 MG capsule Take 1 capsule (100 mg total) by mouth 2 (two) times daily for 5 days.  Marland Kitchen escitalopram (LEXAPRO) 20 MG tablet Take 1 tablet (20 mg total) by mouth daily.  . fluticasone (FLONASE) 50 MCG/ACT nasal spray One spray in each nostril twice a day, use left hand for right nostril, and right hand for left nostril. (Patient taking differently: Place 1 spray into both nostrils in the morning and at bedtime. use left hand for right nostril, and right hand for left nostril.)  . folic acid (FOLVITE) 1 MG tablet Take 1 mg by mouth daily.  Marland Kitchen glucose blood (ONE TOUCH ULTRA TEST) test strip For testing blood sugars twice daily.E11.9  . hydrOXYzine (ATARAX/VISTARIL) 25 MG tablet TAKE 1 TABLET(25 MG) BY MOUTH THREE TIMES DAILY AS NEEDED FOR ANXIETY  . ipratropium-albuterol (DUONEB) 0.5-2.5 (3) MG/3ML SOLN USE 1 VIAL VIA NEBULIZER EVERY 2 HOURS AS NEEDED FOR WHEEZING, SHORTNESS OF BREATH. (Patient taking differently: Inhale 3 mLs into the lungs every 2 (two) hours as needed (Wheezing/SOB). )  . levothyroxine (SYNTHROID) 125 MCG tablet TAKE  1 TABLET BY MOUTH  DAILY BEFORE BREAKFAST (Patient taking differently: Take 125 mcg by mouth daily before breakfast. )  . metFORMIN (GLUCOPHAGE XR) 500 MG 24 hr tablet TAKE 1 TABLET BY MOUTH  DAILY WITH BREAKFAST (Patient taking differently: Take 500 mg by mouth daily with breakfast. )  . Multiple Vitamins-Minerals (ICAPS AREDS 2) CAPS Take 1 capsule 2 (two) times daily by mouth.  . nitroGLYCERIN (NITROSTAT) 0.4 MG SL tablet DISSOLVE 1 TABLET UNDER THE TONGUE EVERY 5 MINUTES AS  NEEDED CHEST PAIN. NOT TO  EXCEED 3 TABLETS IN 15  MINUTES (Patient taking differently: Place 0.4 mg under  the tongue every 5 (five) minutes as needed for chest pain. NOT TO  EXCEED 3 TABLETS IN 15  MINUTES)  . nystatin cream (MYCOSTATIN) Apply 1 application topically 2 (two) times daily.  Marland Kitchen oxybutynin (DITROPAN-XL) 10 MG 24 hr tablet Take 10 mg by mouth at bedtime.  . potassium chloride (KLOR-CON) 10 MEQ tablet TAKE 1 TABLET(10 MEQ) BY MOUTH TWICE DAILY (Patient taking differently: Take 10 mEq by mouth 2 (two) times daily. )  . predniSONE (DELTASONE) 20 MG tablet Take 2 tablets (40 mg total) by mouth daily for 5 days.  . Tiotropium Bromide-Olodaterol (STIOLTO RESPIMAT) 2.5-2.5 MCG/ACT AERS Inhale 2 puffs into the lungs daily.  Marland Kitchen torsemide (DEMADEX) 20 MG tablet TAKE 1 TABLET BY MOUTH  TWICE DAILY (Patient taking differently: Take 20 mg by mouth 2 (two) times daily. )  . triamcinolone cream (KENALOG) 0.1 % Apply 1 application topically 2 (two) times daily.  Marland Kitchen zolpidem (AMBIEN) 10 MG tablet Take 0.5-1 tablets (5-10 mg total) by mouth at bedtime as needed for sleep.   Past Medical History:  Diagnosis Date  . Acute respiratory failure ([place]) 09/25/11   hypoxic  . Anxiety   . Arthritis   . Bronchitis    "seems like 2x/year"  . CAD (coronary artery disease)    a. PTCA 2001. b. f/u cath 05/2012 heavy calcification, no PCI required. c. f/u cath for recurrent sx 2017: stable mod CAD unchanged from prior, mild progression of small vessel disease, elevated LVEDP.  . Colon polyps   . COPD (chronic obstructive pulmonary disease) ([place])   . Diabetes mellitus without complication ([place])   . GERD (gastroesophageal reflux disease)   . High cholesterol   . Hyperglycemia   . Hypertension   . Hypothyroidism   . Peripheral vascular disease ([place])    patient denies knowledge of, but listed in his chart  . Pleural effusion    Parapneumonic ?r/t PNA s/p thoracentesis 2008  . Pneumonia    hx  . Sleep apnea    cpap    Past Surgical History:  Procedure Laterality Date  . CARDIAC CATHETERIZATION N/A 11/25/2015    Procedure: Left Heart Cath and Coronary Angiography;  Surgeon: Leonie Man, MD;  Location: [facility];  Service: Cardiovascular;  Laterality: N/A;  . CATARACT EXTRACTION W/ INTRAOCULAR LENS  IMPLANT, BILATERAL  ~ 2006  . CHOLECYSTECTOMY N/A 10/11/2012   Procedure: LAPAROSCOPIC CHOLECYSTECTOMY WITH INTRAOPERATIVE CHOLANGIOGRAM;  Surgeon: Adin Hector, MD;  Location: [facility];  Service: General;  Laterality: N/A;  . COLONOSCOPY    . CORONARY ANGIOPLASTY  01/2000  . HEMORRHOID SURGERY  1960's  . KNEE ARTHROSCOPY Left 04/19/2017   Procedure: ARTHROSCOPY KNEE, SUBCHONDROPLASTY;  Surgeon: Dorna Leitz, MD;  Location: [facility];  Service: Orthopedics;  Laterality: Left;  . KNEE ARTHROSCOPY WITH MEDIAL MENISECTOMY Left 04/19/2017   Procedure: KNEE ARTHROSCOPY WITH  MEDIAL AND LATERAL MENISECTOMY;  Surgeon: Dorna Leitz, MD;  Location: [facility];  Service: Orthopedics;  Laterality: Left;  . LAPAROSCOPIC RIGHT COLECTOMY N/A 08/31/2017   Procedure: LAPAROSCOPIC ASSISTED ASCENDING COLECTOMY;  Surgeon: Fanny Skates, MD;  Location: [facility];  Service: General;  Laterality: N/A;   Social History   Social History Narrative   Not exercising due to COVID was a member at [employer]. Just had eye exam all clear.   family history includes Arthritis in his mother and sister; Asthma in his mother; Emphysema in his mother; Stroke in his father, mother, and sister.   Review of Systems Decreased hearing wears eyeglasses insomnia he has chronic urine leakage and wears adult diapers also having fatigue and some chronic dyspnea  Objective:   Physical Exam BP 130/72   Pulse 67   Ht '5\' 11"'  (1.803 m)   Wt 244 lb (110.7 kg)   BMI 34.03 kg/m  Elderly white man no acute distress he is obese Inspection of the perianal area shows a moderate perianal dermatitis.  Erythematous a few satellite lesions

## 2020-04-12 ENCOUNTER — Telehealth: Payer: Self-pay

## 2020-04-12 NOTE — Telephone Encounter (Signed)
Bryan Hatfield states Bryan Hatfield is sick and very weak. I advised to take him to the urgent care. She agreed. No openings today.

## 2020-04-12 NOTE — Telephone Encounter (Signed)
Agree with documentation as above.   Pearlee Arvizu, MD  

## 2020-04-15 ENCOUNTER — Ambulatory Visit (INDEPENDENT_AMBULATORY_CARE_PROVIDER_SITE_OTHER): Payer: Medicare Other

## 2020-04-15 ENCOUNTER — Other Ambulatory Visit: Payer: Self-pay

## 2020-04-15 ENCOUNTER — Ambulatory Visit (INDEPENDENT_AMBULATORY_CARE_PROVIDER_SITE_OTHER): Payer: Medicare Other | Admitting: Family Medicine

## 2020-04-15 ENCOUNTER — Encounter: Payer: Self-pay | Admitting: Family Medicine

## 2020-04-15 VITALS — BP 119/58 | HR 81 | Ht 71.0 in | Wt 240.0 lb

## 2020-04-15 DIAGNOSIS — S6992XA Unspecified injury of left wrist, hand and finger(s), initial encounter: Secondary | ICD-10-CM

## 2020-04-15 DIAGNOSIS — R059 Cough, unspecified: Secondary | ICD-10-CM

## 2020-04-15 DIAGNOSIS — E1169 Type 2 diabetes mellitus with other specified complication: Secondary | ICD-10-CM | POA: Diagnosis not present

## 2020-04-15 DIAGNOSIS — R5383 Other fatigue: Secondary | ICD-10-CM

## 2020-04-15 DIAGNOSIS — I517 Cardiomegaly: Secondary | ICD-10-CM

## 2020-04-15 DIAGNOSIS — I10 Essential (primary) hypertension: Secondary | ICD-10-CM

## 2020-04-15 DIAGNOSIS — E669 Obesity, unspecified: Secondary | ICD-10-CM | POA: Diagnosis not present

## 2020-04-15 DIAGNOSIS — R0602 Shortness of breath: Secondary | ICD-10-CM

## 2020-04-15 LAB — POCT GLYCOSYLATED HEMOGLOBIN (HGB A1C): Hemoglobin A1C: 6.6 % — AB (ref 4.0–5.6)

## 2020-04-15 NOTE — Progress Notes (Signed)
Established Patient Office Visit  Subjective:  Patient ID: Bryan Hatfield, male    DOB: [birth year]  Age: 86 y.o. MRN: [id number]  CC:  Chief Complaint  Patient presents with  . Diabetes  . Cough    HPI Bryan Hatfield presents for   Diabetes - no hypoglycemic events. No wounds or sores that are not healing well. No increased thirst or urination. Checking glucose at home. Taking medications as prescribed without any side effects.  F/U Morbid obesity -reports have not been eating the best during his move back to the local area.  There are still getting things set up with her new house.  He also pinched his fingers in the window and bruised under his nails.    Also c/o of cough x 2 weeks his wife was sick starting the week before him.  He had very similar symptoms with productive cough and shortness of breath.  His wife was tested for Covid and was negative.  She had more sinus symptoms but he has not.  No sore throat no ear pain no GI symptoms.  He has felt just extremely fatigued.  He has been taking Mucinex which has helped some.  Past Medical History:  Diagnosis Date  . Acute respiratory failure ([place]) 09/25/11   hypoxic  . Anxiety   . Arthritis   . Bronchitis    "seems like 2x/year"  . CAD (coronary artery disease)    a. PTCA 2001. b. f/u cath 05/2012 heavy calcification, no PCI required. c. f/u cath for recurrent sx 2017: stable mod CAD unchanged from prior, mild progression of small vessel disease, elevated LVEDP.  . Colon polyps   . COPD (chronic obstructive pulmonary disease) ([place])   . Diabetes mellitus without complication ([place])   . GERD (gastroesophageal reflux disease)   . High cholesterol   . Hyperglycemia   . Hypertension   . Hypothyroidism   . Peripheral vascular disease ([place])    patient denies knowledge of, but listed in his chart  . Pleural effusion    Parapneumonic ?r/t PNA s/p thoracentesis 2008  . Pneumonia    hx  . Sleep apnea    cpap     Past Surgical  History:  Procedure Laterality Date  . CARDIAC CATHETERIZATION N/A 11/25/2015   Procedure: Left Heart Cath and Coronary Angiography;  Surgeon: Leonie Man, MD;  Location: [facility];  Service: Cardiovascular;  Laterality: N/A;  . CATARACT EXTRACTION W/ INTRAOCULAR LENS  IMPLANT, BILATERAL  ~ 2006  . CHOLECYSTECTOMY N/A 10/11/2012   Procedure: LAPAROSCOPIC CHOLECYSTECTOMY WITH INTRAOPERATIVE CHOLANGIOGRAM;  Surgeon: Adin Hector, MD;  Location: [facility];  Service: General;  Laterality: N/A;  . COLONOSCOPY    . CORONARY ANGIOPLASTY  01/2000  . HEMORRHOID SURGERY  1960's  . KNEE ARTHROSCOPY Left 04/19/2017   Procedure: ARTHROSCOPY KNEE, SUBCHONDROPLASTY;  Surgeon: Dorna Leitz, MD;  Location: [facility];  Service: Orthopedics;  Laterality: Left;  . KNEE ARTHROSCOPY WITH MEDIAL MENISECTOMY Left 04/19/2017   Procedure: KNEE ARTHROSCOPY WITH MEDIAL AND LATERAL MENISECTOMY;  Surgeon: Dorna Leitz, MD;  Location: [facility];  Service: Orthopedics;  Laterality: Left;  . LAPAROSCOPIC RIGHT COLECTOMY N/A 08/31/2017   Procedure: LAPAROSCOPIC ASSISTED ASCENDING COLECTOMY;  Surgeon: Fanny Skates, MD;  Location: [facility];  Service: General;  Laterality: N/A;    Family History  Problem Relation Age of Onset  . Stroke Mother   . Arthritis Mother   . Asthma Mother   . Emphysema Mother   .  Stroke Father   . Arthritis Sister   . Stroke Sister   . Heart attack Neg Hx     Social History   Socioeconomic History  . Marital status: Married    Spouse name: Ivin Booty  . Number of children: 1  . Years of education: 13  . Highest education level: 9th grade  Occupational History  . Occupation: Retired    Comment: Surveyor, [employer] - [employer]  Tobacco Use  . Smoking status: Former Smoker    Packs/day: 1.00    Years: 10.00    Pack years: 10.00    Types: Cigarettes    Quit date: 06/01/1982    Years since quitting: 37.9  . Smokeless tobacco: Never Used  . Tobacco comment: "quit smoking ~ 25 years ago; smoked  ~ 1 pk/wk"  Vaping Use  . Vaping Use: Never used  Substance and Sexual Activity  . Alcohol use: Yes    Alcohol/week: 1.0 standard drink    Types: 1 Cans of beer per week    Comment: occasionally  . Drug use: No  . Sexual activity: Never  Other Topics Concern  . Not on file  Social History Narrative   Not exercising due to [place] was a member at the [employer] [facility] [employer] [facility] [employer]. Just had eye exam all clear.   Social Determinants of Health   Financial Resource Strain:   . Difficulty of Paying Living Expenses: Not on file  Food Insecurity:   . Worried About Charity fundraiser in the Last Year: Not on file  . Ran Out of Food in the Last Year: Not on file  Transportation Needs:   . Lack of Transportation (Medical): Not on file  . Lack of Transportation (Non-Medical): Not on file  Physical Activity:   . Days of Exercise per Week: Not on file  . Minutes of Exercise per Session: Not on file  Stress:   . Feeling of Stress : Not on file  Social Connections:   . Frequency of Communication with Friends and Family: Not on file  . Frequency of Social Gatherings with Friends and Family: Not on file  . Attends Religious Services: Not on file  . Active Member of Clubs or Organizations: Not on file  . Attends Archivist Meetings: Not on file  . Marital Status: Not on file  Intimate Partner Violence:   . Fear of Current or Ex-Partner: Not on file  . Emotionally Abused: Not on file  . Physically Abused: Not on file  . Sexually Abused: Not on file    Outpatient Medications Prior to Visit  Medication Sig Dispense Refill  . albuterol (PROVENTIL HFA;VENTOLIN HFA) 108 (90 Base) MCG/ACT inhaler Inhale 2 puffs every 6 (six) hours as needed into the lungs for wheezing or shortness of breath.    . AMBULATORY NON FORMULARY MEDICATION Medication Name: 4 prong cane. Dx: gait instability 1 vial 0  . AMBULATORY NON FORMULARY MEDICATION Medication Name: 15 to 20 cm water pressure above the knee compression stocks.   Diagnosis chronic lower extremity edema and venous stasis. 1 vial 0  . Ascorbic Acid (VITAMIN C PO) Take 1 tablet by mouth daily.    Marland Kitchen aspirin EC 81 MG tablet Take 81 mg by mouth daily.      Marland Kitchen atorvastatin (LIPITOR) 40 MG tablet TAKE 1 TABLET BY MOUTH AT  BEDTIME (Patient taking differently: Take 40 mg by mouth at bedtime. ) 90 tablet 3  . Blood Glucose Monitoring Suppl (ONE TOUCH ULTRA 2) w/Device  KIT Onetouch ultra kit. DX: E11.9 1 each 0  . cetirizine (ZYRTEC) 10 MG tablet Take 10 mg by mouth daily.    . clotrimazole-betamethasone (LOTRISONE) cream Apply topically 2 (two) times daily. 30 g 1  . escitalopram (LEXAPRO) 20 MG tablet Take 1 tablet (20 mg total) by mouth daily. 90 tablet 1  . fluticasone (FLONASE) 50 MCG/ACT nasal spray One spray in each nostril twice a day, use left hand for right nostril, and right hand for left nostril. (Patient taking differently: Place 1 spray into both nostrils in the morning and at bedtime. use left hand for right nostril, and right hand for left nostril.) 48 g 3  . folic acid (FOLVITE) 1 MG tablet Take 1 mg by mouth daily.    Marland Kitchen glucose blood (ONE TOUCH ULTRA TEST) test strip For testing blood sugars twice daily.E11.9 200 each 12  . hydrOXYzine (ATARAX/VISTARIL) 25 MG tablet TAKE 1 TABLET(25 MG) BY MOUTH THREE TIMES DAILY AS NEEDED FOR ANXIETY 20 tablet 0  . ipratropium-albuterol (DUONEB) 0.5-2.5 (3) MG/3ML SOLN USE 1 VIAL VIA NEBULIZER EVERY 2 HOURS AS NEEDED FOR WHEEZING, SHORTNESS OF BREATH. (Patient taking differently: Inhale 3 mLs into the lungs every 2 (two) hours as needed (Wheezing/SOB). ) 4050 mL 3  . levothyroxine (SYNTHROID) 125 MCG tablet TAKE 1 TABLET BY MOUTH  DAILY BEFORE BREAKFAST (Patient taking differently: Take 125 mcg by mouth daily before breakfast. ) 90 tablet 3  . metFORMIN (GLUCOPHAGE XR) 500 MG 24 hr tablet TAKE 1 TABLET BY MOUTH  DAILY WITH BREAKFAST (Patient taking differently: Take 500 mg by mouth daily with breakfast. ) 90 tablet 3  .  Multiple Vitamins-Minerals (ICAPS AREDS 2) CAPS Take 1 capsule 2 (two) times daily by mouth.    . nitroGLYCERIN (NITROSTAT) 0.4 MG SL tablet DISSOLVE 1 TABLET UNDER THE TONGUE EVERY 5 MINUTES AS  NEEDED CHEST PAIN. NOT TO  EXCEED 3 TABLETS IN 15  MINUTES (Patient taking differently: Place 0.4 mg under the tongue every 5 (five) minutes as needed for chest pain. NOT TO  EXCEED 3 TABLETS IN 15  MINUTES) 25 tablet 0  . oxybutynin (DITROPAN-XL) 10 MG 24 hr tablet Take 10 mg by mouth at bedtime.    . potassium chloride (KLOR-CON) 10 MEQ tablet TAKE 1 TABLET(10 MEQ) BY MOUTH TWICE DAILY (Patient taking differently: Take 10 mEq by mouth 2 (two) times daily. ) 180 tablet 3  . Tiotropium Bromide-Olodaterol (STIOLTO RESPIMAT) 2.5-2.5 MCG/ACT AERS Inhale 2 puffs into the lungs daily. 4 g 5  . torsemide (DEMADEX) 20 MG tablet TAKE 1 TABLET BY MOUTH  TWICE DAILY (Patient taking differently: Take 20 mg by mouth 2 (two) times daily. ) 180 tablet 3  . triamcinolone cream (KENALOG) 0.1 % Apply 1 application topically 2 (two) times daily.    Marland Kitchen zolpidem (AMBIEN) 10 MG tablet Take 0.5-1 tablets (5-10 mg total) by mouth at bedtime as needed for sleep. 90 tablet 0  . nystatin cream (MYCOSTATIN) Apply 1 application topically 2 (two) times daily. 60 g 1   No facility-administered medications prior to visit.    No Known Allergies  ROS Review of Systems    Objective:    Physical Exam Constitutional:      Appearance: He is well-developed.  HENT:     Head: Normocephalic and atraumatic.     Right Ear: Tympanic membrane, ear canal and external ear normal.     Left Ear: Tympanic membrane, ear canal and external ear normal.  Nose: Nose normal.     Mouth/Throat:     Mouth: Mucous membranes are moist.     Pharynx: Oropharynx is clear. No posterior oropharyngeal erythema.  Eyes:     Conjunctiva/sclera: Conjunctivae normal.     Pupils: Pupils are equal, round, and reactive to light.  Neck:     Thyroid: No  thyromegaly.  Cardiovascular:     Rate and Rhythm: Normal rate.     Heart sounds: Normal heart sounds.  Pulmonary:     Effort: Pulmonary effort is normal.     Breath sounds: Normal breath sounds.  Musculoskeletal:     Cervical back: Neck supple.  Lymphadenopathy:     Cervical: No cervical adenopathy.  Skin:    General: Skin is warm and dry.     Comments: Wallis Bamberg under the nail near the nailbed on the third and fourth fingers on his left hand.  Neurological:     Mental Status: He is alert and oriented to person, place, and time.  Psychiatric:        Mood and Affect: Mood normal.     BP (!) 119/58   Pulse 81   Ht _0  (1.803 m)   Wt 240 lb (108.9 kg)   SpO2 94%   BMI 33.47 kg/m  Wt Readings from Last 3 Encounters:  04/15/20 240 lb (108.9 kg)  03/21/20 244 lb (110.7 kg)  03/16/20 241 lb (109.3 kg)     Health Maintenance Due  Topic Date Due  . URINE MICROALBUMIN  04/24/2020    There are no preventive care reminders to display for this patient.  Lab Results  Component Value Date   TSH 2.33 04/04/2019   Lab Results  Component Value Date   WBC 8.8 04/15/2020   HGB 13.1 (L) 04/15/2020   HCT 39.8 04/15/2020   MCV 89.2 04/15/2020   PLT 410 (H) 04/15/2020   Lab Results  Component Value Date   NA 137 04/15/2020   K 4.5 04/15/2020   CO2 32 04/15/2020   GLUCOSE 118 (H) 04/15/2020   BUN 20 04/15/2020   CREATININE 1.19 (H) 04/15/2020   BILITOT 0.5 04/15/2020   ALKPHOS 101 03/16/2020   AST 27 04/15/2020   ALT 18 04/15/2020   PROT 6.8 04/15/2020   ALBUMIN 3.5 03/16/2020   CALCIUM 9.3 04/15/2020   ANIONGAP 8 03/16/2020   GFR 89.45 05/29/2015   Lab Results  Component Value Date   CHOL 199 01/22/2020   Lab Results  Component Value Date   HDL 58 01/22/2020   Lab Results  Component Value Date   LDLCALC 117 (H) 01/22/2020   Lab Results  Component Value Date   TRIG 129 01/22/2020   Lab Results  Component Value Date   CHOLHDL 3.4 01/22/2020   Lab  Results  Component Value Date   HGBA1C 6.6 (A) 04/15/2020      Assessment & Plan:   Problem List Items Addressed This Visit      Cardiovascular and Mediastinum   Essential hypertension, benign (Chronic)    Well controlled. Continue current regimen. Follow up in 4-6 months.         Endocrine   Diabetes mellitus type 2 in obese [facility]) - Primary    He and his wife recently moved back to [place] and admits during this time of the move he really has not been doing the best with his diet.  They have been eating out a lot.  Hemoglobin A1c up at 6.6 today but  still within acceptable range.  Continue to work on [employer].      Relevant Orders   POCT glycosylated hemoglobin (Hb A1C) (Completed)     Other   Severe obesity (BMI 35.0-39.9) with comorbidity ([place])    He is down 4 pounds which is fantastic though I suspect it is probably from decreased appetite while being sick over the last couple weeks.  Just encouraged him to continue to work on healthy food choices.       Other Visit Diagnoses    Cough       Relevant Orders   DG Chest 2 View (Completed)   SOB (shortness of breath)       Relevant Orders   DG Chest 2 View (Completed)   COMPLETE METABOLIC PANEL WITH GFR (Completed)   CBC with Differential/Platelet (Completed)   Fatigue, unspecified type       Injury of finger of left hand, initial encounter          Cough/shortness of breath-lung exam is clear today but did go ahead and get a chest x-ray just to rule out pneumonia or other causes we will check a CBC as well as a CMP.  His A1c is up but he has lost about 4 pounds though I suspect some of this may just be related to poor eating over the last couple of weeks.  Okay to continue Mucinex.  Once I get his results back I can decide better to what to do in regards to his persistent cough.  Finger injury-appears like it is healing.  But he will likely lose any fingernails at some point.  Meds ordered this  encounter  Medications  . azithromycin (ZITHROMAX) 250 MG tablet    Sig: 2 Ttabs PO on Day 1, then one a day x 4 days.    Dispense:  6 tablet    Refill:  0    Follow-up: Return in about 3 months (around 07/16/2020).    Beatrice Lecher, MD

## 2020-04-15 NOTE — Assessment & Plan Note (Signed)
He is down 4 pounds which is fantastic though I suspect it is probably from decreased appetite while being sick over the last couple weeks.  Just encouraged him to continue to work on healthy food choices.

## 2020-04-15 NOTE — Assessment & Plan Note (Signed)
Well controlled. Continue current regimen. Follow up in 4-6 months.

## 2020-04-15 NOTE — Assessment & Plan Note (Signed)
He and his wife recently moved back to [place] and admits during this time of the move he really has not been doing the best with his diet.  They have been eating out a lot.  Hemoglobin A1c up at 6.6 today but still within acceptable range.  Continue to work on [employer].

## 2020-04-16 ENCOUNTER — Other Ambulatory Visit: Payer: Self-pay

## 2020-04-16 ENCOUNTER — Other Ambulatory Visit: Payer: Self-pay | Admitting: *Deleted

## 2020-04-16 DIAGNOSIS — N289 Disorder of kidney and ureter, unspecified: Secondary | ICD-10-CM

## 2020-04-16 LAB — COMPLETE METABOLIC PANEL WITH GFR
AG Ratio: 1.2 (calc) (ref 1.0–2.5)
ALT: 18 U/L (ref 9–46)
AST: 27 U/L (ref 10–35)
Albumin: 3.7 g/dL (ref 3.6–5.1)
Alkaline phosphatase (APISO): 162 U/L — ABNORMAL HIGH (ref 35–144)
BUN/Creatinine Ratio: 17 (calc) (ref 6–22)
BUN: 20 mg/dL (ref 7–25)
CO2: 32 mmol/L (ref 20–32)
Calcium: 9.3 mg/dL (ref 8.6–10.3)
Chloride: 96 mmol/L — ABNORMAL LOW (ref 98–110)
Creat: 1.19 mg/dL — ABNORMAL HIGH (ref 0.70–1.11)
GFR, Est African American: 62 mL/min/{1.73_m2} (ref >=60)
GFR, Est Non African American: 53 mL/min/{1.73_m2} — ABNORMAL LOW (ref >=60)
Globulin: 3.1 g/dL (calc) (ref 1.9–3.7)
Glucose, Bld: 118 mg/dL — ABNORMAL HIGH (ref 65–99)
Potassium: 4.5 mmol/L (ref 3.5–5.3)
Sodium: 137 mmol/L (ref 135–146)
Total Bilirubin: 0.5 mg/dL (ref 0.2–1.2)
Total Protein: 6.8 g/dL (ref 6.1–8.1)

## 2020-04-16 LAB — CBC WITH DIFFERENTIAL/PLATELET
Absolute Monocytes: 889 cells/uL (ref 200–950)
Basophils Absolute: 53 cells/uL (ref 0–200)
Basophils Relative: 0.6 %
Eosinophils Absolute: 176 cells/uL (ref 15–500)
Eosinophils Relative: 2 %
HCT: 39.8 % (ref 38.5–50.0)
Hemoglobin: 13.1 g/dL — ABNORMAL LOW (ref 13.2–17.1)
Lymphs Abs: 1848 cells/uL (ref 850–3900)
MCH: 29.4 pg (ref 27.0–33.0)
MCHC: 32.9 g/dL (ref 32.0–36.0)
MCV: 89.2 fL (ref 80.0–100.0)
MPV: 10.4 fL (ref 7.5–12.5)
Monocytes Relative: 10.1 %
Neutro Abs: 5834 cells/uL (ref 1500–7800)
Neutrophils Relative %: 66.3 %
Platelets: 410 10*3/uL — ABNORMAL HIGH (ref 140–400)
RBC: 4.46 10*6/uL (ref 4.20–5.80)
RDW: 13.7 % (ref 11.0–15.0)
Total Lymphocyte: 21 %
WBC: 8.8 10*3/uL (ref 3.8–10.8)

## 2020-04-16 MED ORDER — AZITHROMYCIN 250 MG PO TABS: ORAL_TABLET | ORAL | 0 refills | Status: AC

## 2020-04-16 NOTE — Progress Notes (Signed)
Ordered CMP per results.

## 2020-04-23 DIAGNOSIS — L0100 Impetigo, unspecified: Secondary | ICD-10-CM | POA: Diagnosis not present

## 2020-04-23 DIAGNOSIS — N289 Disorder of kidney and ureter, unspecified: Secondary | ICD-10-CM | POA: Diagnosis not present

## 2020-04-23 LAB — COMPLETE METABOLIC PANEL WITH GFR
AG Ratio: 1.1 (calc) (ref 1.0–2.5)
ALT: 23 U/L (ref 9–46)
AST: 35 U/L (ref 10–35)
Albumin: 3.6 g/dL (ref 3.6–5.1)
Alkaline phosphatase (APISO): 158 U/L — ABNORMAL HIGH (ref 35–144)
BUN/Creatinine Ratio: 27 (calc) — ABNORMAL HIGH (ref 6–22)
BUN: 26 mg/dL — ABNORMAL HIGH (ref 7–25)
CO2: 32 mmol/L (ref 20–32)
Calcium: 10 mg/dL (ref 8.6–10.3)
Chloride: 96 mmol/L — ABNORMAL LOW (ref 98–110)
Creat: 0.95 mg/dL (ref 0.70–1.11)
GFR, Est African American: 81 mL/min/{1.73_m2} (ref >=60)
GFR, Est Non African American: 70 mL/min/{1.73_m2} (ref >=60)
Globulin: 3.3 g/dL (calc) (ref 1.9–3.7)
Glucose, Bld: 97 mg/dL (ref 65–99)
Potassium: 4 mmol/L (ref 3.5–5.3)
Sodium: 138 mmol/L (ref 135–146)
Total Bilirubin: 0.4 mg/dL (ref 0.2–1.2)
Total Protein: 6.9 g/dL (ref 6.1–8.1)

## 2020-04-30 DIAGNOSIS — L299 Pruritus, unspecified: Secondary | ICD-10-CM | POA: Diagnosis not present

## 2020-04-30 DIAGNOSIS — L0100 Impetigo, unspecified: Secondary | ICD-10-CM | POA: Diagnosis not present

## 2020-05-01 ENCOUNTER — Other Ambulatory Visit: Payer: Self-pay | Admitting: *Deleted

## 2020-05-01 DIAGNOSIS — R899 Unspecified abnormal finding in specimens from other organs, systems and tissues: Secondary | ICD-10-CM

## 2020-05-04 ENCOUNTER — Other Ambulatory Visit: Payer: Self-pay

## 2020-05-04 ENCOUNTER — Emergency Department (INDEPENDENT_AMBULATORY_CARE_PROVIDER_SITE_OTHER): Payer: Medicare PPO

## 2020-05-04 ENCOUNTER — Emergency Department (INDEPENDENT_AMBULATORY_CARE_PROVIDER_SITE_OTHER)
Admission: EM | Admit: 2020-05-04 | Discharge: 2020-05-04 | Disposition: A | Discharge status: Home / Self Care | Payer: Medicare PPO | Source: Home / Self Care

## 2020-05-04 DIAGNOSIS — R0781 Pleurodynia: Secondary | ICD-10-CM

## 2020-05-04 DIAGNOSIS — R531 Weakness: Secondary | ICD-10-CM | POA: Diagnosis not present

## 2020-05-04 DIAGNOSIS — R5383 Other fatigue: Secondary | ICD-10-CM | POA: Diagnosis not present

## 2020-05-04 LAB — POCT FASTING CBG KUC MANUAL ENTRY: POCT Glucose (KUC): 157 mg/dL — AB (ref 70–99)

## 2020-05-04 NOTE — Discharge Instructions (Signed)
[facility]  [place] [phone]  Go for further evaluation of weakness, fatigue, and unsteadiness.

## 2020-05-04 NOTE — ED Notes (Signed)
Patient is being discharged from the Urgent Care and sent to the Emergency Department via POV driven by wife . Per Lavell Anchors, NP, patient is in need of higher level of care due to need for further evaluation of fatigue and weakness. Patient is aware and verbalizes understanding of plan of care.  Vitals:   05/04/20 1132  BP: 118/75  Pulse: 95  Resp: (!) 22  Temp: 97.6 F (36.4 C)  SpO2: 95%

## 2020-05-04 NOTE — ED Triage Notes (Signed)
Patient presents to Urgent Care with complaints of fatigue and right rib cage pain. Patient reports he saw his PCP and was evaluated for possible pneumonia, testing was negative and was treated w/ antibiotics but he did not get better. Is ambulatory w/ walker and steady gait upon arrival but is notably SOB on exertion. Pt states he fell this morning, landing on the side of a recliner chair, did not hit the floor. States he is just so weak he is afraid something is really wrong. Pt has been vaccinated for covid, was tested for covid recently and it was negative.

## 2020-05-04 NOTE — ED Provider Notes (Signed)
[facility] C[facility]    CSN: [id number] Arrival date & time: 05/04/20  1109      History   Chief Complaint Chief Complaint  Patient presents with  . Fatigue    HPI Bryan Hatfield is a 86 y.o. male.   HPI  Patient presents for evaluation of worsening fatigue, shortness of breath, right sided rib pain, and feeling unwell compared to his baseline. Reports he has required use of a walker due to weakness and he uses a cane at his baseline. He denies cough. He had a fall earlier today as he lost his balance while ambulating. He is unaware of any dysuria and endorses he is unable to urinate. He endorses generalized body aches and thought he had fever. He is afebrile today. Denies any known sick contacts or chest pain. Endorses SOB (has SOB at baseline). Past Medical History:  Diagnosis Date  . Acute respiratory failure ([place]) 09/25/11   hypoxic  . Anxiety   . Arthritis   . Bronchitis    "seems like 2x/year"  . CAD (coronary artery disease)    a. PTCA 2001. b. f/u cath 05/2012 heavy calcification, no PCI required. c. f/u cath for recurrent sx 2017: stable mod CAD unchanged from prior, mild progression of small vessel disease, elevated LVEDP.  . Colon polyps   . COPD (chronic obstructive pulmonary disease) ([place])   . Diabetes mellitus without complication ([place])   . GERD (gastroesophageal reflux disease)   . High cholesterol   . Hyperglycemia   . Hypertension   . Hypothyroidism   . Peripheral vascular disease ([place])    patient denies knowledge of, but listed in his chart  . Pleural effusion    Parapneumonic ?r/t PNA s/p thoracentesis 2008  . Pneumonia    hx  . Sleep apnea    cpap     Patient Active Problem List   Diagnosis Date Noted  . Venous stasis 05/29/2019  . Severe obesity (BMI 35.0-39.9) with comorbidity ([place]) 01/23/2019  . History of squamous cell carcinoma 08/04/2018  . CAD (coronary artery disease) 09/02/2017  . Peripheral vascular disease ([place]) 09/02/2017  .  Acute kidney injury ([place]) 09/02/2017  . Acute blood loss anemia 09/02/2017  . COPD (chronic obstructive pulmonary disease) ([place]) 09/02/2017  . High cholesterol 09/02/2017  . Diabetes mellitus type 2 in obese ([place]) 09/02/2017  . BMI 39.0-39.9,adult 09/02/2017  . Chronic diastolic heart failure ([place]) 09/02/2017  . OSA on CPAP 09/02/2017  . Thrombocytopenia ([place]) 09/02/2017  . Mass of hepatic flexure of colon 08/31/2017  . 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 Acute medial meniscus tear of left knee 04/19/2017  . Primary osteoarthritis of left knee 03/01/2017  . Primary insomnia 02/19/2015  . GAD (generalized anxiety disorder) 11/21/2014  . Arterial hypotension 09/17/2014  . Hypothyroid 09/07/2014  . Essential hypertension, benign 06/02/2013  . Hyperlipidemia 06/02/2013  . MDD (major depressive disorder), recurrent, in full remission ([place]) 06/02/2013  . Coronary artery disease involving native coronary artery of native heart with angina pectoris ([place]) 05/09/2012  . Morbid obesity ([place]) 09/25/2011  . Chronic insomnia 09/21/2007  . COPD GOLD 0/I 09/21/2007  . Chronic obstructive bronchitis without exacerbation ([place]) 09/21/2007  . Sleep apnea 09/20/2007    Past Surgical History:  Procedure Laterality Date  . CARDIAC CATHETERIZATION N/A 11/25/2015   Procedure: Left Heart Cath and Coronary Angiography;  Surgeon: Leonie Man, MD;  Location: [facility];  Service: Cardiovascular;  Laterality: N/A;  . CATARACT EXTRACTION  W/ INTRAOCULAR LENS  IMPLANT, BILATERAL  ~ 2006  . CHOLECYSTECTOMY N/A 10/11/2012   Procedure: LAPAROSCOPIC CHOLECYSTECTOMY WITH INTRAOPERATIVE CHOLANGIOGRAM;  Surgeon: Adin Hector, MD;  Location: [facility];  Service: General;  Laterality: N/A;  . COLONOSCOPY    . CORONARY ANGIOPLASTY  01/2000  . HEMORRHOID SURGERY  1960's  . KNEE ARTHROSCOPY Left 04/19/2017   Procedure: ARTHROSCOPY KNEE, SUBCHONDROPLASTY;  Surgeon: Dorna Leitz, MD;  Location:  [facility];  Service: Orthopedics;  Laterality: Left;  . KNEE ARTHROSCOPY WITH MEDIAL MENISECTOMY Left 04/19/2017   Procedure: KNEE ARTHROSCOPY WITH MEDIAL AND LATERAL MENISECTOMY;  Surgeon: Dorna Leitz, MD;  Location: [facility];  Service: Orthopedics;  Laterality: Left;  . LAPAROSCOPIC RIGHT COLECTOMY N/A 08/31/2017   Procedure: LAPAROSCOPIC ASSISTED ASCENDING COLECTOMY;  Surgeon: Fanny Skates, MD;  Location: [facility];  Service: General;  Laterality: N/A;       Home Medications    Prior to Admission medications   Medication Sig Start Date End Date Taking? Authorizing Provider  albuterol (PROVENTIL HFA;VENTOLIN HFA) 108 (90 Base) MCG/ACT inhaler Inhale 2 puffs every 6 (six) hours as needed into the lungs for wheezing or shortness of breath.    [provider]  AMBULATORY NON FORMULARY MEDICATION Medication Name: 4 prong cane. Dx: gait instability 03/09/17   [name], MD  AMBULATORY NON FORMULARY MEDICATION Medication Name: 15 to 20 cm water pressure above the knee compression stocks.  Diagnosis chronic lower extremity edema and venous stasis. 05/29/19   [name], MD  Ascorbic Acid (VITAMIN C PO) Take 1 tablet by mouth daily.    [provider]  aspirin EC 81 MG tablet Take 81 mg by mouth daily.      [provider]  atorvastatin (LIPITOR) 40 MG tablet TAKE 1 TABLET BY MOUTH AT  BEDTIME Patient taking differently: Take 40 mg by mouth at bedtime.  07/05/19   [name], MD  Blood Glucose Monitoring Suppl (ONE TOUCH ULTRA 2) w/Device KIT Onetouch ultra kit. DX: E11.9 09/21/17   [name], MD  cetirizine (ZYRTEC) 10 MG tablet Take 10 mg by mouth daily.    [provider]  clotrimazole-betamethasone (LOTRISONE) cream Apply topically 2 (two) times daily. 03/21/20   Gatha Mayer, MD  escitalopram (LEXAPRO) 20 MG tablet Take 1 tablet (20 mg total) by mouth daily. 11/03/19   [name], MD  fluticasone (FLONASE) 50  MCG/ACT nasal spray One spray in each nostril twice a day, use left hand for right nostril, and right hand for left nostril. Patient taking differently: Place 1 spray into both nostrils in the morning and at bedtime. use left hand for right nostril, and right hand for left nostril. 05/31/18   Donella Stade, PA-C  folic acid (FOLVITE) 1 MG tablet Take 1 mg by mouth daily. 10/19/18   [provider]  glucose blood (ONE TOUCH ULTRA TEST) test strip For testing blood sugars twice daily.E11.9 09/29/17   [name], MD  hydrOXYzine (ATARAX/VISTARIL) 25 MG tablet TAKE 1 TABLET(25 MG) BY MOUTH THREE TIMES DAILY AS NEEDED FOR ANXIETY 03/19/20   [name], MD  ipratropium-albuterol (DUONEB) 0.5-2.5 (3) MG/3ML SOLN USE 1 VIAL VIA NEBULIZER EVERY 2 HOURS AS NEEDED FOR WHEEZING, SHORTNESS OF BREATH. Patient taking differently: Inhale 3 mLs into the lungs every 2 (two) hours as needed (Wheezing/SOB).  02/12/17   [name], MD  levothyroxine (SYNTHROID) 125 MCG tablet TAKE 1 TABLET BY MOUTH  DAILY BEFORE BREAKFAST  Patient taking differently: Take 125 mcg by mouth daily before breakfast.  12/07/18   [name], MD  metFORMIN (GLUCOPHAGE XR) 500 MG 24 hr tablet TAKE 1 TABLET BY MOUTH  DAILY WITH BREAKFAST Patient taking differently: Take 500 mg by mouth daily with breakfast.  07/10/19   [name], MD  Multiple Vitamins-Minerals (ICAPS AREDS 2) CAPS Take 1 capsule 2 (two) times daily by mouth.    [provider]  nitroGLYCERIN (NITROSTAT) 0.4 MG SL tablet DISSOLVE 1 TABLET UNDER THE TONGUE EVERY 5 MINUTES AS  NEEDED CHEST PAIN. NOT TO  EXCEED 3 TABLETS IN 15  MINUTES Patient taking differently: Place 0.4 mg under the tongue every 5 (five) minutes as needed for chest pain. NOT TO  EXCEED 3 TABLETS IN 15  MINUTES 10/20/19   Belva Crome, MD  oxybutynin (DITROPAN-XL) 10 MG 24 hr tablet Take 10 mg by mouth at bedtime.    [provider]    potassium chloride (KLOR-CON) 10 MEQ tablet TAKE 1 TABLET(10 MEQ) BY MOUTH TWICE DAILY Patient taking differently: Take 10 mEq by mouth 2 (two) times daily.  10/18/19   [name], MD  Tiotropium Bromide-Olodaterol (STIOLTO RESPIMAT) 2.5-2.5 MCG/ACT AERS Inhale 2 puffs into the lungs daily. 05/29/19   [name], MD  torsemide (DEMADEX) 20 MG tablet TAKE 1 TABLET BY MOUTH  TWICE DAILY Patient taking differently: Take 20 mg by mouth 2 (two) times daily.  07/05/19   [name], MD  triamcinolone cream (KENALOG) 0.1 % Apply 1 application topically 2 (two) times daily. 11/10/19   [provider]  zolpidem (AMBIEN) 10 MG tablet Take 0.5-1 tablets (5-10 mg total) by mouth at bedtime as needed for sleep. 03/19/20   [name], MD    Family History Family History  Problem Relation Age of Onset  . Stroke Mother   . Arthritis Mother   . Asthma Mother   . Emphysema Mother   . Stroke Father   . Arthritis Sister   . Stroke Sister   . Heart attack Neg Hx     Social History Social History   Tobacco Use  . Smoking status: Former Smoker    Packs/day: 1.00    Years: 10.00    Pack years: 10.00    Types: Cigarettes    Quit date: 06/01/1982    Years since quitting: 37.9  . Smokeless tobacco: Never Used  . Tobacco comment: "quit smoking ~ 25 years ago; smoked ~ 1 pk/wk"  Vaping Use  . Vaping Use: Never used  Substance Use Topics  . Alcohol use: Yes    Alcohol/week: 1.0 standard drink    Types: 1 Cans of beer per week    Comment: occasionally  . Drug use: No     Allergies   Patient has no known allergies.  Review of Systems Review of Systems Pertinent negatives listed in HPI   Physical Exam Triage Vital Signs ED Triage Vitals  Enc Vitals Group     BP 05/04/20 1132 118/75     Pulse Rate 05/04/20 1132 95     Resp 05/04/20 1132 (!) 22     Temp 05/04/20 1132 97.6 F (36.4 C)     Temp Source 05/04/20 1132 Oral     SpO2 05/04/20 1132  95 %     Weight 05/04/20 1129 240 lb (108.9 kg)     Height 05/04/20 1129 '5\' 11"'  (1.803 m)     Head Circumference --  Peak Flow --      Pain Score 05/04/20 1129 8     Pain Loc --      Pain Edu? --      Excl. in [place]? --    No data found.  Updated Vital Signs BP 118/75 (BP Location: Right Arm)   Pulse 95   Temp 97.6 F (36.4 C) (Oral)   Resp (!) 22   Ht '5\' 11"'  (1.803 m)   Wt 230 lb (104.3 kg)   SpO2 95%   BMI 32.08 kg/m   Visual Acuity Right Eye Distance:   Left Eye Distance:   Bilateral Distance:    Right Eye Near:   Left Eye Near:    Bilateral Near:     Physical Exam Constitutional:      Appearance: He is ill-appearing and toxic-appearing.  Cardiovascular:     Rate and Rhythm: Normal rate. Rhythm irregular.  Pulmonary:     Comments: Diminished lung sounds CTABL Abdominal:     General: There is distension.     Tenderness: There is no right CVA tenderness or left CVA tenderness.  Musculoskeletal:        General: Normal range of motion.     Cervical back: Normal range of motion.  Lymphadenopathy:     Cervical: No cervical adenopathy.  Skin:    General: Skin is dry.  Neurological:     Mental Status: He is alert.  Psychiatric:        Mood and Affect: Mood normal.        Thought Content: Thought content normal.        Judgment: Judgment normal.      UC Treatments / Results  Labs (all labs ordered are listed, but only abnormal results are displayed) Labs Reviewed  POCT FASTING CBG [facility] MANUAL ENTRY - Abnormal; Notable for the following components:      Result Value   POCT Glucose ([facility]) 157 (*)    All other components within normal limits  POCT URINALYSIS DIP (MANUAL ENTRY)  POCT CBC W AUTO DIFF ([facility])    EKG   Radiology No results found.  Procedures Procedures (including critical care time)  Medications Ordered in UC Medications - No data to display  Initial Impression / Assessment and Plan / UC Course  I have reviewed the  triage vital signs and the nursing notes.  Pertinent labs & imaging results that were available during my care of the patient were reviewed by me and considered in my medical decision making (see chart for details).     EKG, changes seen with frequent PVC's. Chest x-ray unremarkable.  CBC stable. Given patient's generalized presentation, increase risk for complications related to age and commodities, and inability to urinate, recent fall, discussed with wife, patient warrants further evaluation in the setting of the Emergency department. Patient and spouse agreed with plan. Patient will [facility]   Final Clinical Impressions(s) / UC Diagnoses   Final diagnoses:  Weakness  Other fatigue     Discharge Instructions     Go [facility]  [place] [phone]  Go for further evaluation of weakness, fatigue, and unsteadiness.    ED Prescriptions    None     PDMP not reviewed this encounter.   Scot Jun, [place] 05/04/20 [phone]

## 2020-05-06 ENCOUNTER — Other Ambulatory Visit: Payer: Self-pay

## 2020-05-06 ENCOUNTER — Telehealth: Payer: Self-pay

## 2020-05-06 ENCOUNTER — Ambulatory Visit (INDEPENDENT_AMBULATORY_CARE_PROVIDER_SITE_OTHER): Payer: Medicare PPO | Admitting: Family Medicine

## 2020-05-06 ENCOUNTER — Encounter: Payer: Self-pay | Admitting: Family Medicine

## 2020-05-06 VITALS — BP 125/73 | HR 92 | Ht 71.0 in | Wt 230.0 lb

## 2020-05-06 DIAGNOSIS — R109 Unspecified abdominal pain: Secondary | ICD-10-CM

## 2020-05-06 DIAGNOSIS — C781 Secondary malignant neoplasm of mediastinum: Secondary | ICD-10-CM | POA: Insufficient documentation

## 2020-05-06 DIAGNOSIS — R21 Rash and other nonspecific skin eruption: Secondary | ICD-10-CM

## 2020-05-06 MED ORDER — VALACYCLOVIR HCL 1 G PO TABS: 1000.0000 mg | ORAL_TABLET | Freq: Three times a day (TID) | ORAL | 0 refills | Status: AC

## 2020-05-06 MED ORDER — HYDROCODONE-ACETAMINOPHEN 5-325 MG PO TABS: 1.0000 | ORAL_TABLET | Freq: Three times a day (TID) | ORAL | 0 refills | Status: DC | PRN

## 2020-05-06 MED ORDER — GABAPENTIN 100 MG PO CAPS: 100.0000 mg | ORAL_CAPSULE | Freq: Three times a day (TID) | ORAL | 0 refills | Status: AC | PRN

## 2020-05-06 NOTE — Telephone Encounter (Signed)
[name] called and left a message stating Bryan Hatfield has cancer. I called and scheduled him for today.    IMPRESSION: 1. Metastatic disease within the liver and chest. Consider esophageal primary versus colonic primary depending history (proximal colectomy for reasons I don't know). 2. Prostatomegaly, no sclerotic bone lesions. 3. Age indeterminant mild L4 vertebral body compression fracture.

## 2020-05-06 NOTE — Assessment & Plan Note (Signed)
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  Reviewed the results with him he was aware of most of the details given him in the emergency department.  He says at 9 he really does not want to undergo any type of chemotherapy or radiation therapy.  We did discuss 2 options one would be palliative care to help [place] set some goals of care.  Which could eventually lead into hospice if needed.  We also discussed consultation with oncology to at least see what his options and potential treatment options would be and then better decide at that point whether or not he wants to do the treatments.  Did encourage him to give [place] a few days to consider.  He is really in a lot of pain today and so I do not want that to necessarily impact his decision 1 way or the other until we can get his pain under control.

## 2020-05-06 NOTE — Progress Notes (Signed)
Established Patient Office Visit  Subjective:  Patient ID: Bryan Hatfield, male    DOB: 05-08-1927  Age: 86 y.o. MRN: [id number]  CC:  Chief Complaint  Patient presents with  . Hospitalization Follow-up    Cancer DX    HPI [name] presents for follow-up from emergency department visit.  He said on December 4, on Saturday he started having  intense right-sided flank pain, persistent fatigue, and generalized weakness.  I had actually seen him about 2-1/2 weeks ago.  He had developed a cough and some shortness of breath.  Chest x-ray was normal at the time his wife had also been sick, though she was starting to feel better.  I treated him with azithromycin.  He says his cough is actually better than when I saw him but he still has a mild intermittent cough.  In the emergency department he had a CT scan done of the abdomen and pelvis.  It showed multiple scattered diffuse pulmonary nodules, scattered liver nodules, right hilar lymphadenopathy, and possible distal esophageal mass with a gastrohepatic ligament lymph node measuring 5 x 2 mm.  He also was noted to have an enlarged prostate gland and a 2-1/2 cm abdominal aortic bulge.  Also in possible old L4 vertebral compression fracture and thoracic spine DISH.  He reports in the last couple days has had a little difficulty swallowing in his throat but denies any dysphagia or problems with food getting stuck in his chest or esophagus area.  He says his biggest concern today is the pain in his right flank area he says it is a 10 out of 10 today and he just cannot get pain relief.  It is keeping him awake at night.  It itches and burns and is painful all the same time.  He has not noticed a distinct rash.  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  Followed by Bryan Hatfield.  He has lost about 10 lbs in the last 3 weeks.   Past Medical History:  Diagnosis Date  . Acute respiratory failure ([place])  09/25/11   hypoxic  . Anxiety   . Arthritis   . Bronchitis    "seems like 2x/year"  . CAD (coronary artery disease)    a. PTCA 2001. b. f/u cath 05/2012 heavy calcification, no PCI required. c. f/u cath for recurrent sx 2017: stable mod CAD unchanged from prior, mild progression of small vessel disease, elevated LVEDP.  . Colon polyps   . COPD (chronic obstructive pulmonary disease) ([place])   . Diabetes mellitus without complication ([place])   . GERD (gastroesophageal reflux disease)   . High cholesterol   . Hyperglycemia   . Hypertension   . Hypothyroidism   . Peripheral vascular disease ([place])    patient denies knowledge of, but listed in his chart  . Pleural effusion    Parapneumonic ?r/t PNA s/p thoracentesis 2008  . Pneumonia    hx  . Sleep apnea    cpap     Past Surgical History:  Procedure Laterality Date  . CARDIAC CATHETERIZATION N/A 11/25/2015   Procedure: Left Heart Cath and Coronary Angiography;  Surgeon: Leonie Man, MD;  Location: [facility];  Service: Cardiovascular;  Laterality: N/A;  . CATARACT EXTRACTION W/ INTRAOCULAR LENS  IMPLANT, BILATERAL  ~ 2006  . CHOLECYSTECTOMY N/A 10/11/2012   Procedure: LAPAROSCOPIC CHOLECYSTECTOMY WITH INTRAOPERATIVE CHOLANGIOGRAM;  Surgeon: Adin Hector, MD;  Location: [facility];  Service: General;  Laterality: N/A;  . COLONOSCOPY    . CORONARY ANGIOPLASTY  01/2000  . HEMORRHOID SURGERY  1960's  . KNEE ARTHROSCOPY Left 04/19/2017   Procedure: ARTHROSCOPY KNEE, SUBCHONDROPLASTY;  Surgeon: Dorna Leitz, MD;  Location: [facility];  Service: Orthopedics;  Laterality: Left;  . KNEE ARTHROSCOPY WITH MEDIAL MENISECTOMY Left 04/19/2017   Procedure: KNEE ARTHROSCOPY WITH MEDIAL AND LATERAL MENISECTOMY;  Surgeon: Dorna Leitz, MD;  Location: [facility];  Service: Orthopedics;  Laterality: Left;  . LAPAROSCOPIC RIGHT COLECTOMY N/A 08/31/2017   Procedure: LAPAROSCOPIC ASSISTED ASCENDING COLECTOMY;  Surgeon: Fanny Skates, MD;  Location: [facility];   Service: General;  Laterality: N/A;    Family History  Problem Relation Age of Onset  . Stroke Mother   . Arthritis Mother   . Asthma Mother   . Emphysema Mother   . Stroke Father   . Arthritis Sister   . Stroke Sister   . Heart attack Neg Hx     Social History   Socioeconomic History  . Marital status: Married    Spouse name: Bryan Hatfield  . Number of children: 1  . Years of education: 71  . Highest education level: 9th grade  Occupational History  . Occupation: Retired    Comment: Surveyor, [employer] - [employer]  Tobacco Use  . Smoking status: Former Smoker    Packs/day: 1.00    Years: 10.00    Pack years: 10.00    Types: Cigarettes    Quit date: 06/01/1982    Years since quitting: 37.9  . Smokeless tobacco: Never Used  . Tobacco comment: "quit smoking ~ 25 years ago; smoked ~ 1 pk/wk"  Vaping Use  . Vaping Use: Never used  Substance and Sexual Activity  . Alcohol use: Yes    Alcohol/week: 1.0 standard drink    Types: 1 Cans of beer per week    Comment: occasionally  . Drug use: No  . Sexual activity: Never  Other Topics Concern  . Not on file  Social History Narrative   Not exercising due to [place] was a member at the [facility]. Just had eye exam all clear.   Social Determinants of Health   Financial Resource Strain:   . Difficulty of Paying Living Expenses: Not on file  Food Insecurity:   . Worried About Charity fundraiser in the Last Year: Not on file  . Ran Out of Food in the Last Year: Not on file  Transportation Needs:   . Lack of Transportation (Medical): Not on file  . Lack of Transportation (Non-Medical): Not on file  Physical Activity:   . Days of Exercise per Week: Not on file  . Minutes of Exercise per Session: Not on file  Stress:   . Feeling of Stress : Not on file  Social Connections:   . Frequency of Communication with Friends and Family: Not on file  . Frequency of Social Gatherings with Friends and Family: Not on file  . Attends Religious  Services: Not on file  . Active Member of Clubs or Organizations: Not on file  . Attends Archivist Meetings: Not on file  . Marital Status: Not on file  Intimate Partner Violence:   . Fear of Current or Ex-Partner: Not on file  . Emotionally Abused: Not on file  . Physically Abused: Not on file  . Sexually Abused: Not on file    Outpatient Medications Prior to Visit  Medication Sig Dispense Refill  . albuterol (PROVENTIL HFA;VENTOLIN HFA)  108 (90 Base) MCG/ACT inhaler Inhale 2 puffs every 6 (six) hours as needed into the lungs for wheezing or shortness of breath.    . AMBULATORY NON FORMULARY MEDICATION Medication Name: 4 prong cane. Dx: gait instability 1 vial 0  . AMBULATORY NON FORMULARY MEDICATION Medication Name: 15 to 20 cm water pressure above the knee compression stocks.  Diagnosis chronic lower extremity edema and venous stasis. 1 vial 0  . Ascorbic Acid (VITAMIN C PO) Take 1 tablet by mouth daily.    Marland Kitchen aspirin EC 81 MG tablet Take 81 mg by mouth daily.      Marland Kitchen atorvastatin (LIPITOR) 40 MG tablet TAKE 1 TABLET BY MOUTH AT  BEDTIME (Patient taking differently: Take 40 mg by mouth at bedtime. ) 90 tablet 3  . Blood Glucose Monitoring Suppl (ONE TOUCH ULTRA 2) w/Device KIT Onetouch ultra kit. DX: E11.9 1 each 0  . cetirizine (ZYRTEC) 10 MG tablet Take 10 mg by mouth daily.    . clotrimazole-betamethasone (LOTRISONE) cream Apply topically 2 (two) times daily. 30 g 1  . escitalopram (LEXAPRO) 20 MG tablet Take 1 tablet (20 mg total) by mouth daily. 90 tablet 1  . fluticasone (FLONASE) 50 MCG/ACT nasal spray One spray in each nostril twice a day, use left hand for right nostril, and right hand for left nostril. (Patient taking differently: Place 1 spray into both nostrils in the morning and at bedtime. use left hand for right nostril, and right hand for left nostril.) 48 g 3  . folic acid (FOLVITE) 1 MG tablet Take 1 mg by mouth daily.    Marland Kitchen glucose blood (ONE TOUCH ULTRA TEST)  test strip For testing blood sugars twice daily.E11.9 200 each 12  . hydrOXYzine (ATARAX/VISTARIL) 25 MG tablet TAKE 1 TABLET(25 MG) BY MOUTH THREE TIMES DAILY AS NEEDED FOR ANXIETY 20 tablet 0  . ipratropium-albuterol (DUONEB) 0.5-2.5 (3) MG/3ML SOLN USE 1 VIAL VIA NEBULIZER EVERY 2 HOURS AS NEEDED FOR WHEEZING, SHORTNESS OF BREATH. (Patient taking differently: Inhale 3 mLs into the lungs every 2 (two) hours as needed (Wheezing/SOB). ) 4050 mL 3  . levothyroxine (SYNTHROID) 125 MCG tablet TAKE 1 TABLET BY MOUTH  DAILY BEFORE BREAKFAST (Patient taking differently: Take 125 mcg by mouth daily before breakfast. ) 90 tablet 3  . metFORMIN (GLUCOPHAGE XR) 500 MG 24 hr tablet TAKE 1 TABLET BY MOUTH  DAILY WITH BREAKFAST (Patient taking differently: Take 500 mg by mouth daily with breakfast. ) 90 tablet 3  . Multiple Vitamins-Minerals (ICAPS AREDS 2) CAPS Take 1 capsule 2 (two) times daily by mouth.    . nitroGLYCERIN (NITROSTAT) 0.4 MG SL tablet DISSOLVE 1 TABLET UNDER THE TONGUE EVERY 5 MINUTES AS  NEEDED CHEST PAIN. NOT TO  EXCEED 3 TABLETS IN 15  MINUTES (Patient taking differently: Place 0.4 mg under the tongue every 5 (five) minutes as needed for chest pain. NOT TO  EXCEED 3 TABLETS IN 15  MINUTES) 25 tablet 0  . oxybutynin (DITROPAN-XL) 10 MG 24 hr tablet Take 10 mg by mouth at bedtime.    . potassium chloride (KLOR-CON) 10 MEQ tablet TAKE 1 TABLET(10 MEQ) BY MOUTH TWICE DAILY (Patient taking differently: Take 10 mEq by mouth 2 (two) times daily. ) 180 tablet 3  . Tiotropium Bromide-Olodaterol (STIOLTO RESPIMAT) 2.5-2.5 MCG/ACT AERS Inhale 2 puffs into the lungs daily. 4 g 5  . torsemide (DEMADEX) 20 MG tablet TAKE 1 TABLET BY MOUTH  TWICE DAILY (Patient taking differently: Take 20 mg by  mouth 2 (two) times daily. ) 180 tablet 3  . triamcinolone cream (KENALOG) 0.1 % Apply 1 application topically 2 (two) times daily.    Marland Kitchen zolpidem (AMBIEN) 10 MG tablet Take 0.5-1 tablets (5-10 mg total) by mouth at  bedtime as needed for sleep. 90 tablet 0   No facility-administered medications prior to visit.    No Known Allergies  ROS Review of Systems    Objective:    Physical Exam Constitutional:      Appearance: He is well-developed.  HENT:     Head: Normocephalic and atraumatic.  Cardiovascular:     Rate and Rhythm: Normal rate and regular rhythm.     Heart sounds: Normal heart sounds.  Pulmonary:     Effort: Pulmonary effort is normal.     Breath sounds: Normal breath sounds.  Musculoskeletal:     Comments: Right mid back he has diffuse excoriations and scabbing.  No open wounds or drainage.  No blisters.  Skin:    General: Skin is warm and dry.  Neurological:     Mental Status: He is alert and oriented to person, place, and time.  Psychiatric:        Behavior: Behavior normal.     BP 125/73   Pulse 92   Ht '5\' 11"'  (1.803 m)   Wt 230 lb (104.3 kg)   SpO2 95%   BMI 32.08 kg/m  Wt Readings from Last 3 Encounters:  05/06/20 230 lb (104.3 kg)  05/04/20 230 lb (104.3 kg)  04/15/20 240 lb (108.9 kg)     Health Maintenance Due  Topic Date Due  . FOOT EXAM  04/24/2020  . URINE MICROALBUMIN  04/24/2020    There are no preventive care reminders to display for this patient.  Lab Results  Component Value Date   TSH 2.33 04/04/2019   Lab Results  Component Value Date   WBC 8.8 04/15/2020   HGB 13.1 (L) 04/15/2020   HCT 39.8 04/15/2020   MCV 89.2 04/15/2020   PLT 410 (H) 04/15/2020   Lab Results  Component Value Date   NA 138 04/23/2020   K 4.0 04/23/2020   CO2 32 04/23/2020   GLUCOSE 97 04/23/2020   BUN 26 (H) 04/23/2020   CREATININE 0.95 04/23/2020   BILITOT 0.4 04/23/2020   ALKPHOS 101 03/16/2020   AST 35 04/23/2020   ALT 23 04/23/2020   PROT 6.9 04/23/2020   ALBUMIN 3.5 03/16/2020   CALCIUM 10.0 04/23/2020   ANIONGAP 8 03/16/2020   GFR 89.45 05/29/2015   Lab Results  Component Value Date   CHOL 199 01/22/2020   Lab Results  Component Value  Date   HDL 58 01/22/2020   Lab Results  Component Value Date   LDLCALC 117 (H) 01/22/2020   Lab Results  Component Value Date   TRIG 129 01/22/2020   Lab Results  Component Value Date   CHOLHDL 3.4 01/22/2020   Lab Results  Component Value Date   HGBA1C 6.6 (A) 04/15/2020      Assessment & Plan:   Problem List Items Addressed This Visit      Cardiovascular and Mediastinum   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  Reviewed the results with him he was aware of most of the details given  him in the emergency department.  He says at 85 he really does not want to undergo any type of chemotherapy or radiation therapy.  We did discuss 2 options one would be palliative care to help [place] set some goals of care.  Which could eventually lead into hospice if needed.  We also discussed consultation with oncology to at least see what his options and potential treatment options would be and then better decide at that point whether or not he wants to do the treatments.  Did encourage him to give [place] a few days to consider.  He is really in a lot of pain today and so I do not want that to necessarily impact his decision 1 way or the other until we can get his pain under control.      Relevant Medications   valACYclovir (VALTREX) 1000 MG tablet    Other Visit Diagnoses    Right flank pain    -  Primary   Relevant Medications   valACYclovir (VALTREX) 1000 MG tablet   gabapentin (NEURONTIN) 100 MG capsule   HYDROcodone-acetaminophen (NORCO/VICODIN) 5-325 MG tablet   Rash          Flank pain with extensive excoriations-unclear to tell if there may actually be a rash underneath or not.  But with such significant discomfort itching and burning I am concerned about the possibility of  shingles.  We will go ahead and treat with valacyclovir, gabapentin and low-dose hydrocodone at bedtime in place of his sleeping medicine.  Encouraged him not to mix them.  Meds ordered this encounter  Medications  . valACYclovir (VALTREX) 1000 MG tablet    Sig: Take 1 tablet (1,000 mg total) by mouth 3 (three) times daily for 7 days.    Dispense:  21 tablet    Refill:  0  . gabapentin (NEURONTIN) 100 MG capsule    Sig: Take 1 capsule (100 mg total) by mouth 3 (three) times daily as needed.    Dispense:  90 capsule    Refill:  0  . HYDROcodone-acetaminophen (NORCO/VICODIN) 5-325 MG tablet    Sig: Take 1 tablet by mouth 3 (three) times daily as needed for up to 5 days for severe pain.    Dispense:  15 tablet    Refill:  0    Follow-up: Return if symptoms worsen or fail to improve.    Beatrice Lecher, MD

## 2020-05-08 ENCOUNTER — Telehealth: Payer: Self-pay

## 2020-05-08 DIAGNOSIS — C781 Secondary malignant neoplasm of mediastinum: Secondary | ICD-10-CM

## 2020-05-08 DIAGNOSIS — R109 Unspecified abdominal pain: Secondary | ICD-10-CM

## 2020-05-08 NOTE — Telephone Encounter (Addendum)
Bryan Hatfield states the pain medication is working. He has decided to go with palliative care instead of oncology.   She is also requesting to have the pain medication refilled.

## 2020-05-09 MED ORDER — HYDROCODONE-ACETAMINOPHEN 5-325 MG PO TABS: 1.0000 | ORAL_TABLET | Freq: Three times a day (TID) | ORAL | 0 refills | Status: DC | PRN

## 2020-05-09 NOTE — Telephone Encounter (Signed)
meds refilled and order placed. Have him schedule f/u for next week if he can

## 2020-05-09 NOTE — Telephone Encounter (Signed)
Pt's wife notified of rx, referral, and to try to schedule a f/u for next week.  She stated that he doesn't feel like doing anything since he's in so much pain.

## 2020-05-10 NOTE — Telephone Encounter (Signed)
We can I schedule him for a virtual appointment if he is not feeling well.  Just need to follow-up on the pain medicine so that we can make any adjustments to make him comfortable and also try to get him a 30-day supply but I will just need to be able to document that conversation.

## 2020-05-13 ENCOUNTER — Telehealth: Payer: Self-pay | Admitting: Family Medicine

## 2020-05-13 ENCOUNTER — Telehealth (INDEPENDENT_AMBULATORY_CARE_PROVIDER_SITE_OTHER): Payer: Medicare PPO | Admitting: Family Medicine

## 2020-05-13 ENCOUNTER — Encounter: Payer: Self-pay | Admitting: Family Medicine

## 2020-05-13 ENCOUNTER — Telehealth: Payer: Self-pay

## 2020-05-13 DIAGNOSIS — G8929 Other chronic pain: Secondary | ICD-10-CM | POA: Insufficient documentation

## 2020-05-13 DIAGNOSIS — R531 Weakness: Secondary | ICD-10-CM

## 2020-05-13 DIAGNOSIS — S0990XA Unspecified injury of head, initial encounter: Secondary | ICD-10-CM

## 2020-05-13 DIAGNOSIS — T402X5A Adverse effect of other opioids, initial encounter: Secondary | ICD-10-CM

## 2020-05-13 DIAGNOSIS — C781 Secondary malignant neoplasm of mediastinum: Secondary | ICD-10-CM

## 2020-05-13 DIAGNOSIS — Z9181 History of falling: Secondary | ICD-10-CM

## 2020-05-13 DIAGNOSIS — Z515 Encounter for palliative care: Secondary | ICD-10-CM

## 2020-05-13 DIAGNOSIS — K5903 Drug induced constipation: Secondary | ICD-10-CM | POA: Diagnosis not present

## 2020-05-13 DIAGNOSIS — R109 Unspecified abdominal pain: Secondary | ICD-10-CM

## 2020-05-13 MED ORDER — SENNOSIDES-DOCUSATE SODIUM 8.6-50 MG PO TABS: 1.0000 | ORAL_TABLET | Freq: Two times a day (BID) | ORAL | 1 refills | Status: AC

## 2020-05-13 MED ORDER — HYDROCODONE-ACETAMINOPHEN 5-325 MG PO TABS: 1.0000 | ORAL_TABLET | Freq: Three times a day (TID) | ORAL | 0 refills | Status: AC | PRN

## 2020-05-13 MED ORDER — AMBULATORY NON FORMULARY MEDICATION: 0 refills | Status: AC

## 2020-05-13 NOTE — Telephone Encounter (Signed)
Can try to see if we can go with a different kind of care company I am not sure if the one we reached out to was [facility] or a [facility].  But we can certainly try the other one to see if they have more availability. He has appt today so we can see how he is doing on pain meds.

## 2020-05-13 NOTE — Progress Notes (Signed)
Pt's wife reports that has has to help him off of the commode. She did notice 1-2 times that he had blood in his stool. This was bright red. She has had to call the [facility] to help him off of the floor he had fallen 3 days ago. No injuries just c/o back pain. He was using the rolator to move from one place to another and his legs are extremely weak.    She states that he only has 10 of the pain pills left.   He hasn't been taking his regular medications only the Pain med and Gabapentin

## 2020-05-13 NOTE — Telephone Encounter (Signed)
Please call [facility] concerning Bryan Hatfield because they told her that they can not come out until 06/15/19 and she needs help sooner. She also also only has 10 Pain pills left for him. She would like to speak to Dr. Madilyn Fireman or Kenney Houseman today before Palliative Care calls her back. Thank you

## 2020-05-13 NOTE — Assessment & Plan Note (Addendum)
Opioid Risk Score of 1.  Discussed medications he is doing well and except for constipation no other side effects.  I did go ahead and send over 60 tabs for the next 30 days encouraged him to not use it if he was not having pain and to only take it if he was having pain.  He also has the gabapentin on file as well to use.  I asked if he was feeling more sedated on the medication and he said no.

## 2020-05-13 NOTE — Assessment & Plan Note (Signed)
We will try to get in touch with either [facility] or another hospice company to see if they can get to him a lot sooner.  A month is just way too far out it sounds like he is getting weaker very quickly his wife now having a difficult time getting him up off the toilet I do think he would benefit from a toilet riser or maybe even a bedside toilet.

## 2020-05-13 NOTE — Progress Notes (Signed)
Virtual Visit via Telephone Note  I connected with Bryan Hatfield on 05/13/20 at  1:00 PM EST by telephone and verified that I am speaking with the correct person using two identifiers.   I discussed the limitations, risks, security and privacy concerns of performing an evaluation and management service by telephone and the availability of in person appointments. I also discussed with the patient that there may be a patient responsible charge related to this service. The patient expressed understanding and agreed to proceed.  Patient location: at home.  Provider loccation: In office   Subjective:    CC:   HPI: Sat night was trying to grab his rollator and fell backwards. He did hit his head. No laceration or cut.  They did call EMS and they helped him up but he opted not to go to the hospital.  He says he has had some headaches on and off since then.  He is eating and and drinking normally he says he really has not had a decrease in his appetite.Marland Kitchen   His side is not hurting like it was on his side.  He says in fact it actually seems to be much better.  He says the pain medication was helpful.  Though it did cause some constipation.  Was recently found to have metastatic disease on chest CT done in the emergency department.  Showed diffuse pulmonary nodules, scattered liver nodules, right hilar lymphadenopathy and possible distal esophageal mass gastrohepatic ligament lymph node measuring 5 x 2 mm.  After going over there is results with him he reports that he just wants palliative care.  We had ordered that when I saw him last week.  He says that they received a call from [facility] and they said their first available appointment is about 4 weeks out.  Past medical history, Surgical history, Family history not pertinant except as noted below, Social history, Allergies, and medications have been entered into the medical record, reviewed, and corrections made.   Review of Systems: No fevers,  chills, night sweats, weight loss, chest pain, or shortness of breath.   Objective:    General: Speaking clearly in complete sentences without any shortness of breath.  Alert and oriented x3.  Normal judgment. No apparent acute distress.    Impression and Recommendations:    Encounter for chronic pain management Opioid Risk Score of 1.  Discussed medications he is doing well and except for constipation no other side effects.  I did go ahead and send over 60 tabs for the next 30 days encouraged him to not use it if he was not having pain and to only take it if he was having pain.  He also has the gabapentin on file as well to use.  I asked if he was feeling more sedated on the medication and he said no.  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  A month is just way too far out it sounds like he is getting weaker very quickly his wife now having a difficult time getting him up off the toilet I do think he would benefit from a toilet riser or maybe even a bedside toilet.    Constipation due to opioid therapy-he is already using some MiraLAX and says it is helping some but not enough so we will add Senokot-S.  Prescription sent to pharmacy.  Right  flank pain-pain seems to have improved over the last couple of days.  Recent fall, head injury-he still having some headaches I asked if he felt like we need to do further work-up with head CT etc. he declines further work-up at this time.   I discussed the assessment and treatment plan with the patient. The patient was provided an opportunity to ask questions and all were answered. The patient agreed with the plan and demonstrated an understanding of the instructions.   The patient was advised to call back or seek an in-person evaluation if the symptoms worsen or if the condition fails to improve as anticipated.  I  provided 22 minutes of non-face-to-face time during this encounter.   Beatrice Lecher, MD

## 2020-05-13 NOTE — Telephone Encounter (Signed)
Spoke with patients wife and scheduled an in-person Palliative Consult for 05/17/20 @ 12:30PM   COVID screening was negative. No pets in home. Patient lives with wife Bryan Hatfield.   Consent obtained; updated Outlook/Netsmart/Team List and Epic.

## 2020-05-13 NOTE — Telephone Encounter (Signed)
Attempted to contact patient's wife [name] to schedule a Palliative care consult appointment. No answer left a voicemail to return call.

## 2020-05-15 NOTE — Telephone Encounter (Signed)
Bryan Hatfield is in the hospital due to a fall. He has a broken toe.

## 2020-05-15 NOTE — Telephone Encounter (Signed)
Bryan Hatfield is in the hospital. He fell and broke his toe.

## 2020-05-15 NOTE — Telephone Encounter (Signed)
I called [facility] to get pateint set up for palliative care. When Baxter Flattery reviewed patients chart she stated he was a candidate for hospice and spoke with him and his wife about it. They agreed to [facility] and asked if it was ok to set him up with these services since family was agreeable, I gave her the ok. They went out on 05/13/2020 and set patient up with services. Today 05/15/2020 Baxter Flattery emailed to let me know he is now in the hospital from a bad fall at home and has multiple fractures and will be having surgery today. They will be continuing to follow his care while there and then again when he returns home. - CF

## 2020-05-17 ENCOUNTER — Other Ambulatory Visit: Payer: Self-pay

## 2020-05-17 ENCOUNTER — Other Ambulatory Visit: Payer: Medicare PPO | Admitting: Internal Medicine

## 2020-05-21 ENCOUNTER — Telehealth: Payer: Self-pay | Admitting: Family Medicine

## 2020-05-21 NOTE — Telephone Encounter (Signed)
Hi Tonya, can you do me a favor and call out and surely and see how they are doing.  It looks like they released him to go home yesterday on the 20th.  Just 1 to see if they need anything.  It looks like he fell and fractured his foot and it required surgery.

## 2020-05-22 NOTE — Telephone Encounter (Signed)
Spoke w/Shirley and she said that he is in a SNF for now. She said that she is unable to care for him and that she has just been staying in and that she has some friends and family going to check on him until she goes by later this week.   Asked that she call us if we can be of any assistance to her or him during this time.   She was very grateful and said that she would, and thanked [place] for calling to check on them.

## 2020-05-25 ENCOUNTER — Other Ambulatory Visit: Payer: Self-pay | Admitting: Family Medicine

## 2020-06-03 ENCOUNTER — Telehealth: Payer: Self-pay

## 2020-06-03 NOTE — Telephone Encounter (Signed)
Bryan Hatfield is still in rehab, per Bryan Hatfield. She states hospice is going in to evaluate.

## 2020-06-04 NOTE — Telephone Encounter (Signed)
Thank you for letting me know.  Nani Gasser, MD

## 2020-06-10 NOTE — Telephone Encounter (Signed)
[facility]osp[facility] called after 4 on 2022/09/11. They left a message stating Bryan Hatfield passed away on 09-11-22.

## 2020-07-02 DEATH — deceased

## 2020-07-16 ENCOUNTER — Ambulatory Visit: Payer: Medicare Other | Admitting: Family Medicine
